# Patient Record
Sex: Female | Born: 1975
Health system: Southern US, Community
[De-identification: ages and names within clinical notes are randomized; demographics above are authoritative.]

## PROBLEM LIST (undated history)

## (undated) DIAGNOSIS — I10 Essential (primary) hypertension: Secondary | ICD-10-CM

## (undated) DIAGNOSIS — G4730 Sleep apnea, unspecified: Secondary | ICD-10-CM

## (undated) DIAGNOSIS — K589 Irritable bowel syndrome without diarrhea: Secondary | ICD-10-CM

## (undated) DIAGNOSIS — E1143 Type 2 diabetes mellitus with diabetic autonomic (poly)neuropathy: Secondary | ICD-10-CM

## (undated) DIAGNOSIS — K219 Gastro-esophageal reflux disease without esophagitis: Secondary | ICD-10-CM

## (undated) DIAGNOSIS — E785 Hyperlipidemia, unspecified: Secondary | ICD-10-CM

## (undated) DIAGNOSIS — J329 Chronic sinusitis, unspecified: Secondary | ICD-10-CM

## (undated) DIAGNOSIS — H47019 Ischemic optic neuropathy, unspecified eye: Secondary | ICD-10-CM

## (undated) DIAGNOSIS — K3184 Gastroparesis: Secondary | ICD-10-CM

## (undated) DIAGNOSIS — J45909 Unspecified asthma, uncomplicated: Secondary | ICD-10-CM

## (undated) DIAGNOSIS — Z8669 Personal history of other diseases of the nervous system and sense organs: Secondary | ICD-10-CM

## (undated) DIAGNOSIS — F419 Anxiety disorder, unspecified: Secondary | ICD-10-CM

## (undated) DIAGNOSIS — G8929 Other chronic pain: Secondary | ICD-10-CM

## (undated) DIAGNOSIS — F32A Depression, unspecified: Secondary | ICD-10-CM

## (undated) DIAGNOSIS — E119 Type 2 diabetes mellitus without complications: Secondary | ICD-10-CM

## (undated) DIAGNOSIS — G629 Polyneuropathy, unspecified: Secondary | ICD-10-CM

## (undated) HISTORY — DX: Hyperlipidemia, unspecified: E78.5

## (undated) HISTORY — DX: Gastro-esophageal reflux disease without esophagitis: K21.9

## (undated) HISTORY — PX: ENDOMETRIAL ABLATION: SHX621

## (undated) HISTORY — PX: CHOLECYSTECTOMY: SHX55

## (undated) HISTORY — DX: Depression, unspecified: F32.A

## (undated) HISTORY — DX: Ischemic optic neuropathy, unspecified eye: H47.019

## (undated) HISTORY — PX: GALLBLADDER SURGERY: SHX652

## (undated) HISTORY — DX: Anxiety disorder, unspecified: F41.9

## (undated) HISTORY — DX: Personal history of other diseases of the nervous system and sense organs: Z86.69

## (undated) HISTORY — PX: TUBAL LIGATION: SHX77

## (undated) HISTORY — DX: Unspecified asthma, uncomplicated: J45.909

## (undated) HISTORY — DX: Polyneuropathy, unspecified: G62.9

## (undated) HISTORY — DX: Sleep apnea, unspecified: G47.30

---
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
## 2013-12-27 DIAGNOSIS — E1142 Type 2 diabetes mellitus with diabetic polyneuropathy: Secondary | ICD-10-CM | POA: Insufficient documentation

## 2013-12-27 DIAGNOSIS — E119 Type 2 diabetes mellitus without complications: Secondary | ICD-10-CM | POA: Insufficient documentation

## 2013-12-27 DIAGNOSIS — Z720 Tobacco use: Secondary | ICD-10-CM | POA: Insufficient documentation

## 2013-12-27 DIAGNOSIS — E782 Mixed hyperlipidemia: Secondary | ICD-10-CM | POA: Insufficient documentation

## 2013-12-27 DIAGNOSIS — E1169 Type 2 diabetes mellitus with other specified complication: Secondary | ICD-10-CM | POA: Insufficient documentation

## 2013-12-27 DIAGNOSIS — K219 Gastro-esophageal reflux disease without esophagitis: Secondary | ICD-10-CM | POA: Insufficient documentation

## 2013-12-27 DIAGNOSIS — E785 Hyperlipidemia, unspecified: Secondary | ICD-10-CM | POA: Insufficient documentation

## 2013-12-27 DIAGNOSIS — Z794 Long term (current) use of insulin: Secondary | ICD-10-CM

## 2015-01-20 LAB — HM COLONOSCOPY

## 2015-04-23 DIAGNOSIS — G4733 Obstructive sleep apnea (adult) (pediatric): Secondary | ICD-10-CM | POA: Insufficient documentation

## 2015-05-20 DIAGNOSIS — G4733 Obstructive sleep apnea (adult) (pediatric): Secondary | ICD-10-CM | POA: Diagnosis not present

## 2015-05-22 ENCOUNTER — Telehealth: Payer: Self-pay

## 2015-05-25 DIAGNOSIS — G4733 Obstructive sleep apnea (adult) (pediatric): Secondary | ICD-10-CM | POA: Diagnosis not present

## 2015-05-28 ENCOUNTER — Encounter (HOSPITAL_COMMUNITY): Payer: Self-pay

## 2015-05-28 ENCOUNTER — Emergency Department (HOSPITAL_COMMUNITY)
Admission: EM | Admit: 2015-05-28 | Discharge: 2015-05-28 | Disposition: A | Discharge status: Home / Self Care | Payer: 59 | Source: Home / Self Care | Attending: Family Medicine | Admitting: Family Medicine

## 2015-05-28 DIAGNOSIS — J209 Acute bronchitis, unspecified: Secondary | ICD-10-CM

## 2015-05-28 HISTORY — DX: Hyperlipidemia, unspecified: E78.5

## 2015-05-28 HISTORY — DX: Type 2 diabetes mellitus without complications: E11.9

## 2015-05-28 MED ORDER — ALBUTEROL SULFATE HFA 108 (90 BASE) MCG/ACT IN AERS: 2.0000 | INHALATION_SPRAY | Freq: Four times a day (QID) | RESPIRATORY_TRACT | Status: DC | PRN

## 2015-05-28 NOTE — ED Provider Notes (Signed)
CSN: [id number]     Arrival date & time 05/28/15  1407 History   First MD Initiated Contact with Patient 05/28/15 1549     Chief Complaint  Patient presents with  . Sinusitis   (Consider location/radiation/quality/duration/timing/severity/associated sxs/prior Treatment) Patient is a 40 y.o. female presenting with sinusitis. The history is provided by the patient. No language interpreter was used.  Sinusitis Associated symptoms: congestion and cough   Associated symptoms: no chest pain, no chills, no ear pain, no sneezing and no wheezing   Patient here at the request of her supervisor, for worsening cough and sputum production over the past several days. She was diagnosed with sinusitis on Dec 30th by her ENT in [place], [place], and started on a 3-week Levaquin regimen.  Completed on Jan 20th. Was doing better regarding the sinus symptoms.  Began with cough and congestion last week, had fever to 103F on Feb 1st.  Has had no recurrent fevers since.  Actually feels like this is clearing up, but comes at request of her supervisor.   Patient is a smoker, trying to quit. Smoking less since onset of illness.   Past Medical History  Diagnosis Date  . Diabetes mellitus without complication (HCC)   . Hyperlipemia    Past Surgical History  Procedure Laterality Date  . Gallbladder surgery    . Tubal ligation    . Endometrial ablation     Family History  Problem Relation Age of Onset  . Cancer Mother   . Diabetes Father    Social History  Substance Use Topics  . Smoking status: Current Every Day Smoker -- 1.00 packs/day    Types: Cigarettes  . Smokeless tobacco: Never Used  . Alcohol Use: No   OB History    No data available     Review of Systems  Constitutional: Positive for appetite change. Negative for chills, activity change and unexpected weight change.  HENT: Positive for congestion, postnasal drip and sinus pressure. Negative for ear pain, facial swelling, mouth sores and  sneezing.   Respiratory: Positive for cough. Negative for chest tightness and wheezing.   Cardiovascular: Negative for chest pain.  All other systems reviewed and are negative.   Allergies  Gabapentin and Keflex  Home Medications   Prior to Admission medications   Medication Sig Start Date End Date Taking? Authorizing Provider  atorvastatin (LIPITOR) 80 MG tablet Take 80 mg by mouth daily.   Yes Historical Provider, MD  canagliflozin (INVOKANA) 100 MG TABS tablet Take by mouth daily before breakfast.   Yes Historical Provider, MD  metFORMIN (GLUCOPHAGE) 500 MG tablet Take by mouth 2 (two) times daily with a meal.   Yes Historical Provider, MD  montelukast (SINGULAIR) 10 MG tablet Take 10 mg by mouth at bedtime.   Yes Historical Provider, MD  pantoprazole (PROTONIX) 40 MG tablet Take 40 mg by mouth daily.   Yes Historical Provider, MD  pregabalin (LYRICA) 50 MG capsule Take 50 mg by mouth 3 (three) times daily.   Yes Historical Provider, MD  albuterol (PROVENTIL HFA;VENTOLIN HFA) 108 (90 Base) MCG/ACT inhaler Inhale 2 puffs into the lungs every 6 (six) hours as needed for wheezing or shortness of breath. 05/28/15   Barbaraann Barthel, MD   Meds Ordered and Administered this Visit  Medications - No data to display  BP 122/77 mmHg  Pulse 77  Temp(Src) 98.1 F (36.7 C) (Oral)  Resp 16  SpO2 97% No data found.   Physical Exam  Constitutional: She  appears well-developed and well-nourished. No distress.  Generally well, speaking in fluid sentences, no distress  HENT:  Head: Normocephalic and atraumatic.  Right Ear: External ear normal.  Left Ear: External ear normal.  Mouth/Throat: Oropharynx is clear and moist. No oropharyngeal exudate.  Boggy nasal turbinates Mildly tender maxillary sinuses with palpation.  Nontender frontal sinuses with palpation.   Eyes: Conjunctivae and EOM are normal. Pupils are equal, round, and reactive to light. Right eye exhibits no discharge. Left eye  exhibits no discharge.  Neck: Normal range of motion. Neck supple. No thyromegaly present.  Cardiovascular: Normal rate and regular rhythm.   Pulmonary/Chest: Effort normal and breath sounds normal. No respiratory distress. She has no wheezes. She has no rales. She exhibits no tenderness.  Lymphadenopathy:    She has no cervical adenopathy.  Skin: She is not diaphoretic.    ED Course  Procedures (including critical care time)  Labs Review Labs Reviewed - No data to display  Imaging Review No results found.   Visual Acuity Review  Right Eye Distance:   Left Eye Distance:   Bilateral Distance:    Right Eye Near:   Left Eye Near:    Bilateral Near:         MDM   1. Acute bronchitis, unspecified organism    Cough, no exam findings to suggest lobar PNA.  Patient actually feels she is improving. Counseled on mucinex  twice daily; increased fluids; continued use of nasal steroids twice daily as she is doing; smoking cessation.  May use albuterol HFA as needed for cough in the short-term. Should continue with ENT for further management of her sinusitis.   Paula Compton, MD     Barbaraann Barthel, MD 05/28/15 [phone]

## 2015-05-28 NOTE — ED Notes (Signed)
Pt stated that she recently had a sinus infection and was given a 21 day dosage of levaquin. Pt stated that her symptoms have returned,productive cough and sinus congestion Pt alert and oriented

## 2015-05-28 NOTE — Discharge Instructions (Signed)
It was a pleasure to see you today.  Your exam today does not look like a pneumonia.   I am giving you a prescription for albuterol inhaler, 2 puffs by mouth every 4 to 6 hours as needed for shortness of breath/cough.   I am glad you are working on quitting smoking!  I recommend following up with your ENT for continued management of your sinusitis.

## 2015-05-29 DIAGNOSIS — G4733 Obstructive sleep apnea (adult) (pediatric): Secondary | ICD-10-CM | POA: Diagnosis not present

## 2015-06-01 MED FILL — PANTOPRAZOLE SOD DR 40 MG T: 40 | 30 days supply | Qty: 60 | Fill #0

## 2015-06-01 MED FILL — LYRICA 50 MG CAPSULE: 50 | 30 days supply | Qty: 30 | Fill #0

## 2015-06-03 MED FILL — INVOKANA 100 MG TABLET: 100 | 30 days supply | Qty: 30 | Fill #0

## 2015-06-16 MED FILL — MONTELUKAST SOD 10 MG TAB: 10 | 30 days supply | Qty: 30 | Fill #0

## 2015-06-20 ENCOUNTER — Encounter: Payer: 59 | Attending: "Endocrinology

## 2015-06-20 VITALS — Ht 62.0 in | Wt 262.6 lb

## 2015-06-20 DIAGNOSIS — Z713 Dietary counseling and surveillance: Secondary | ICD-10-CM | POA: Diagnosis not present

## 2015-06-20 DIAGNOSIS — E119 Type 2 diabetes mellitus without complications: Secondary | ICD-10-CM

## 2015-06-20 NOTE — Progress Notes (Signed)
Patient was seen on 06/20/15 for the complete diabetes self-management series at [facility]. This is a part of the Link to Wellness Program.  Handouts given during class include:  Living Well with Diabetes book  Carb Counting and Meal Planning book  Meal Plan Card  Carbohydrate guide  Meal planning worksheet  Low Sodium Flavoring Tips  The diabetes portion plate  Low Carbohydrate Snack Suggestions  A1c to eAG Conversion Chart  Diabetes Medications  Stress Management  Diabetes Recommended Care Schedule  Diabetes Success Plan  Core Class Satisfaction Survey  The following learning objectives were met by the patient during this course:  Describe diabetes  State some common risk factors for diabetes  Defines the role of glucose and insulin  Identifies type of diabetes and pathophysiology  Describe the relationship between diabetes and cardiovascular risk  State the members of the Healthcare Team  States the rationale for glucose monitoring  State when to test glucose  State their individual Target Range  State the importance of logging glucose readings  Describe how to interpret glucose readings  Identifies A1C target  Explain the correlation between A1c and eAG values  State symptoms and treatment of high blood glucose  State symptoms and treatment of low blood glucose  Explain proper technique for glucose testing  Identifies proper sharps disposal  Describe the role of different macronutrients on glucose  Explain how carbohydrates affect blood glucose  State what foods contain the most carbohydrates  Demonstrate carbohydrate counting  Demonstrate how to read Nutrition Facts food label  Describe effects of various fats on heart health  Describe the importance of good nutrition for health and healthy eating strategies  Describe techniques for managing your shopping, cooking and meal planning  List  strategies to follow meal plan when dining out  Describe the effects of alcohol on glucose and how to use it safely . State the amount of activity recommended for healthy living . Describe activities suitable for individual needs . Identify ways to regularly incorporate activity into daily life . Identify barriers to activity and ways to over come these barriers  Identify diabetes medications being personally used and their primary action for lowering glucose and possible side effects . Describe role of stress on blood glucose and develop strategies to address psychosocial issues . Identify diabetes complications and ways to prevent them  Explain how to manage diabetes during illness . Evaluate success in meeting personal goal . Establish 2-3 goals that they will plan to diligently work on until they return for the  4-month follow-up visit  Goals:   I will count my carb choices at most meals and snacks  Try to reduce/cut out soda  I will be active 30 minutes or more 7 times a week  I will take my diabetes medications as scheduled  I will eat less unhealthy fats by eating less junk food  I will test my glucose at least 2 times a day, 7 days a week  I will look at patterns in my record book at least 10 days a month  To help manage stress I will  Take a day trip with my family to reboot  Your patient has identified these potential barriers to change:  Motivation  Your patient has identified their diabetes self-care support plan as  [facility] Support Group Family Support Link to Wellness  Plan: Follow up with Link to Wellness Care Coordinator  

## 2015-06-22 DIAGNOSIS — G4733 Obstructive sleep apnea (adult) (pediatric): Secondary | ICD-10-CM | POA: Diagnosis not present

## 2015-06-30 MED FILL — LYRICA 50 MG CAPSULE: 50 | 30 days supply | Qty: 30 | Fill #1

## 2015-06-30 MED FILL — ATORVASTATIN 80 MG TABLET: 80 | 90 days supply | Qty: 90 | Fill #0

## 2015-06-30 MED FILL — PANTOPRAZOLE SOD DR 40 MG T: 40 | 30 days supply | Qty: 60 | Fill #1

## 2015-07-03 DIAGNOSIS — G4733 Obstructive sleep apnea (adult) (pediatric): Secondary | ICD-10-CM | POA: Diagnosis not present

## 2015-07-08 MED FILL — INVOKANA 100 MG TABLET: 100 | 30 days supply | Qty: 30 | Fill #1

## 2015-07-13 ENCOUNTER — Other Ambulatory Visit: Payer: Self-pay

## 2015-07-13 VITALS — BP 112/66 | HR 84 | Resp 16 | Ht 62.0 in | Wt 269.8 lb

## 2015-07-13 DIAGNOSIS — E119 Type 2 diabetes mellitus without complications: Secondary | ICD-10-CM

## 2015-07-13 NOTE — Patient Instructions (Addendum)
1. Plan to eat 30-45 GM (2-3) servings of carbohydrate a meal and 15 GM for snacks.  Plan to eat protein with your snacks 2. Plan to check blood sugar twice a day fasting and 1 -2hrs after a meal.  Goals of 80-130 fasting and 180 or less after eating. 3. Plan to cut out canned Mountain Dew at dinner 3 times a week 4. Plan to walk 4-5 times a week for 30 minutes.  Goal of 150 minutes a week 5. Plan to see provider on April 7 6. Plan to complete EMMI by Aug 31, 2015 7. Plan to return to Link to Wellness on 09/01/15 12:15PM

## 2015-07-13 NOTE — Patient Outreach (Signed)
[facility]) Care Management   07/13/2015  Kelsey Vang 1975/07/28 [id number]  Kelsey Vang is an 40 y.o. female.  Member seen for initial office visit for Link to Wellness program for self management of Type 2 diabetes  Subjective: Member states that she wants to get her diabetes under better control and to lose weight.  States that she went to the diabetes class and learned a lot.  States she is trying to watch her portion sizes but she is still drinking Mountain dew daily.  States she can not drink diet drinks as they irritate her bladder.  States she has signed up to start the [employer] classes next month to stop smoking.  States she walks her dogs daily which is her only exercise.  States her glucometer is broken and she has not checked her blood sugars for about a week.    Objective:   Review of Systems  Neurological: Positive for headaches.  All other systems reviewed and are negative.   Physical Exam Today's Vitals   07/13/15 1506 07/13/15 1509  BP: 112/66   Pulse: 84   Resp: 16   Height: 1.575 m ( )   Weight: 269 lb 12.8 oz (122.38 kg)   SpO2: 98%   PainSc: 6  6    Current Medications:   Current Outpatient Prescriptions  Medication Sig Dispense Refill  . atorvastatin (LIPITOR) 80 MG tablet Take 80 mg by mouth daily.    . canagliflozin (INVOKANA) 100 MG TABS tablet Take by mouth daily before breakfast.    . cetirizine (ZYRTEC) 10 MG tablet Take 10 mg by mouth daily.    . Cholecalciferol (VITAMIN D) 2000 units CAPS Take 1 capsule by mouth daily.    . cyclobenzaprine (FLEXERIL) 10 MG tablet Take 10 mg by mouth 3 (three) times daily as needed for muscle spasms.    . fluticasone (FLONASE) 50 MCG/ACT nasal spray Place 1 spray into both nostrils 2 (two) times daily.    . metFORMIN (GLUCOPHAGE) 500 MG tablet Take by mouth 2 (two) times daily with a meal.    . Misc Natural Products (CRANBERRY/PROBIOTIC PO) Take 1 capsule by mouth 2 (two) times daily.     . montelukast (SINGULAIR) 10 MG tablet Take 10 mg by mouth at bedtime.    . pantoprazole (PROTONIX) 40 MG tablet Take 40 mg by mouth 2 (two) times daily.     . pentosan polysulfate (ELMIRON) 100 MG capsule Take 100 mg by mouth 3 (three) times daily.    Marland Kitchen albuterol (PROVENTIL HFA;VENTOLIN HFA) 108 (90 Base) MCG/ACT inhaler Inhale 2 puffs into the lungs every 6 (six) hours as needed for wheezing or shortness of breath. (Patient not taking: Reported on 07/13/2015) 1 Inhaler 0  . pregabalin (LYRICA) 50 MG capsule Take 50 mg by mouth daily.      No current facility-administered medications for this visit.    Functional Status:   In your present state of health, do you have any difficulty performing the following activities: 07/13/2015  Hearing? N  Vision? N  Difficulty concentrating or making decisions? N  Walking or climbing stairs? N  Dressing or bathing? N  Doing errands, shopping? N    Fall/Depression Screening:    PHQ 2/9 Scores 07/13/2015 06/20/2015  PHQ - 2 Score 0 0    Assessment: Member seen for initial office visit for Link to Wellness program for self management of Type 2 diabetes.  Member has completed required education classes and  her pharmacy benefits have been activated.  Member is not at diabetes self management goal of hemoglobin A1C of 7% or below with last reading of 7.1%.  Member issued new True Metrix glucometer and she is to check twice a day.  She reports trying to watch her portion sizes better but continues to drink regular soda daily.  Members is walking her dogs daily for 15-20 minutes.  Member is currently smoking but plans to quit using the [employer] classes next month.  Member is up to date with annual eye exams and regular dental check ups. Member is considering joining [employer] to assist with weight loss.  Plan:   1. Plan to eat 30-45 GM (2-3) servings of carbohydrate a meal and 15 GM for snacks.  Plan to eat protein with your snacks 2. Plan to check  blood sugar twice a day fasting and 1 -2hrs after a meal.  Goals of 80-130 fasting and 180 or less after eating. 3. Plan to cut out canned Mountain Dew at dinner 3 times a week 4. Plan to walk 4-5 times a week for 30 minutes.  Goal of 150 minutes a week 5. Plan to see provider on April 7 6. Plan to complete EMMI by Aug 31, 2015 7. Plan to return to Link to Wellness on 09/01/15 12:15PM  [facility] CM Care Plan Problem One        Most Recent Value   Care Plan Problem One  Potential for elevated blood sugars related to dx of Type 2 DM   Role Documenting the Problem One  Care Management Coordinator   Care Plan for Problem One  Active   [facility] Long Term Goal (31-90 days)  Member will decrease hemoglobin A1C to 7 or below in the next 90 days   [facility] Long Term Goal Start Date  07/13/15   Interventions for Problem One Long Term Goal  Given Link to Wellness diabetes education packet and reviewed program requirements, Instructed on CHO counting and portion  control, Discussed cutting back and stopping drinking [address], Instructed on importance of losing weight and given written instructions on how to join [employer] with the [facility] discount, Encouraged to stop smoking and to attend the Quit smart classes she has signed up to attend, Issued True Metrix glucometer and demostrated on how to use, Instructed to contact her provider to call in RX for strips and testing supplies, Instructed on blood sugar goals before and after eating    Dudley Major RN, [facility] Care Management Coordinator-Link to Wellness [facility] Care Management [phone]

## 2015-07-23 DIAGNOSIS — G4733 Obstructive sleep apnea (adult) (pediatric): Secondary | ICD-10-CM | POA: Diagnosis not present

## 2015-07-24 DIAGNOSIS — K219 Gastro-esophageal reflux disease without esophagitis: Secondary | ICD-10-CM | POA: Diagnosis not present

## 2015-07-24 DIAGNOSIS — E7800 Pure hypercholesterolemia, unspecified: Secondary | ICD-10-CM | POA: Diagnosis not present

## 2015-07-24 DIAGNOSIS — J329 Chronic sinusitis, unspecified: Secondary | ICD-10-CM | POA: Diagnosis not present

## 2015-07-24 DIAGNOSIS — R300 Dysuria: Secondary | ICD-10-CM | POA: Diagnosis not present

## 2015-07-24 DIAGNOSIS — E119 Type 2 diabetes mellitus without complications: Secondary | ICD-10-CM | POA: Diagnosis not present

## 2015-07-24 DIAGNOSIS — Z7984 Long term (current) use of oral hypoglycemic drugs: Secondary | ICD-10-CM | POA: Diagnosis not present

## 2015-07-24 MED FILL — LISINOPRIL 5 MG TABLET: 5 | 90 days supply | Qty: 90 | Fill #0

## 2015-07-24 MED FILL — METFORMIN HCL ER 500 MG TAB: 500 | 90 days supply | Qty: 90 | Fill #0

## 2015-07-24 MED FILL — TRUE METRIX GLUCOSE TEST ST: 90 days supply | Qty: 200 | Fill #0

## 2015-07-24 MED FILL — CYCLOBENZAPRINE 10 MG TAB: 10 | 15 days supply | Qty: 45 | Fill #0

## 2015-07-24 MED FILL — MONTELUKAST SOD 10 MG TAB: 10 | 90 days supply | Qty: 90 | Fill #0

## 2015-07-24 MED FILL — FLUCONAZOLE 150 MG TABLET: 150 | 1 days supply | Qty: 1 | Fill #0

## 2015-08-07 MED FILL — PANTOPRAZOLE SOD DR 40 MG T: 40 | 30 days supply | Qty: 60 | Fill #2

## 2015-08-07 MED FILL — LYRICA 50 MG CAPSULE: 50 | 30 days supply | Qty: 30 | Fill #2

## 2015-08-10 MED FILL — INVOKANA 100 MG TABLET: 100 | 30 days supply | Qty: 30 | Fill #2

## 2015-08-13 DIAGNOSIS — G43909 Migraine, unspecified, not intractable, without status migrainosus: Secondary | ICD-10-CM | POA: Diagnosis not present

## 2015-08-13 DIAGNOSIS — H6690 Otitis media, unspecified, unspecified ear: Secondary | ICD-10-CM | POA: Diagnosis not present

## 2015-08-13 MED FILL — FLUCONAZOLE 150 MG TABLET: 150 | 1 days supply | Qty: 1 | Fill #0

## 2015-08-13 MED FILL — ONDANSETRON HCL 4 MG TABLET: 4 | 20 days supply | Qty: 20 | Fill #0

## 2015-08-14 MED FILL — FLUCONAZOLE 150 MG TABLET: 150 | 1 days supply | Qty: 1 | Fill #0

## 2015-08-14 MED FILL — AZITHROMYCIN 250 MG TABLET: 250 | 5 days supply | Qty: 6 | Fill #0

## 2015-08-22 DIAGNOSIS — G4733 Obstructive sleep apnea (adult) (pediatric): Secondary | ICD-10-CM | POA: Diagnosis not present

## 2015-08-28 DIAGNOSIS — J343 Hypertrophy of nasal turbinates: Secondary | ICD-10-CM | POA: Diagnosis not present

## 2015-08-28 DIAGNOSIS — J342 Deviated nasal septum: Secondary | ICD-10-CM | POA: Diagnosis not present

## 2015-08-28 DIAGNOSIS — J324 Chronic pansinusitis: Secondary | ICD-10-CM | POA: Diagnosis not present

## 2015-08-28 MED FILL — NITROFURANTOIN MONO-MCR 100: 100 | 5 days supply | Qty: 10 | Fill #0

## 2015-09-01 ENCOUNTER — Ambulatory Visit: Payer: Self-pay

## 2015-09-04 MED FILL — PANTOPRAZOLE SOD DR 40 MG T: 40 | 90 days supply | Qty: 180 | Fill #3

## 2015-09-04 MED FILL — INVOKANA 100 MG TABLET: 100 | 90 days supply | Qty: 90 | Fill #0

## 2015-09-04 MED FILL — LYRICA 50 MG CAPSULE: 50 | 30 days supply | Qty: 30 | Fill #3

## 2015-09-07 MED FILL — METFORMIN HCL ER 500 MG TAB: 500 | 90 days supply | Qty: 180 | Fill #0

## 2015-09-11 DIAGNOSIS — G4733 Obstructive sleep apnea (adult) (pediatric): Secondary | ICD-10-CM | POA: Diagnosis not present

## 2015-09-22 DIAGNOSIS — G4733 Obstructive sleep apnea (adult) (pediatric): Secondary | ICD-10-CM | POA: Diagnosis not present

## 2015-09-23 DIAGNOSIS — N898 Other specified noninflammatory disorders of vagina: Secondary | ICD-10-CM | POA: Diagnosis not present

## 2015-09-23 DIAGNOSIS — Z720 Tobacco use: Secondary | ICD-10-CM | POA: Diagnosis not present

## 2015-09-23 DIAGNOSIS — R102 Pelvic and perineal pain: Secondary | ICD-10-CM | POA: Diagnosis not present

## 2015-09-23 MED FILL — metroNIDAZOLE 500 MG TABS: 500 | 7 days supply | Qty: 14 | Fill #0

## 2015-09-24 MED FILL — ELMIRON 100 MG CAPSULE: 100 | 30 days supply | Qty: 90 | Fill #0

## 2015-10-07 MED FILL — LYRICA 50 MG CAPSULE: 50 | 30 days supply | Qty: 30 | Fill #4

## 2015-10-07 MED FILL — ATORVASTATIN 80 MG TABLET: 80 | 30 days supply | Qty: 30 | Fill #1

## 2015-10-12 DIAGNOSIS — G4733 Obstructive sleep apnea (adult) (pediatric): Secondary | ICD-10-CM | POA: Diagnosis not present

## 2015-10-28 MED FILL — FLUTICASONE PROP 50 MCG SPR: 50 | 30 days supply | Qty: 16 | Fill #0

## 2015-10-29 MED FILL — MONTELUKAST SOD 10 MG TAB: 10 | 90 days supply | Qty: 90 | Fill #1

## 2015-10-29 MED FILL — LISINOPRIL 5 MG TABLET: 5 | 90 days supply | Qty: 90 | Fill #1

## 2015-10-29 MED FILL — ELMIRON 100 MG CAPSULE: 100 | 30 days supply | Qty: 90 | Fill #1

## 2015-10-29 MED FILL — TRUE METRIX GLUCOSE TEST ST: 90 days supply | Qty: 200 | Fill #1

## 2015-10-30 ENCOUNTER — Other Ambulatory Visit: Payer: Self-pay

## 2015-10-30 VITALS — BP 104/62 | HR 89 | Resp 16 | Ht 62.0 in | Wt 272.2 lb

## 2015-10-30 DIAGNOSIS — E119 Type 2 diabetes mellitus without complications: Secondary | ICD-10-CM

## 2015-10-30 NOTE — Patient Instructions (Signed)
1. Plan to eat 30-45 GM (2-3) servings of carbohydrate a meal and 15 GM for snacks.  Plan to eat protein with your snacks and eat a bedtime snack with protein 2. Plan to check blood sugar twice a day fasting and 1 -2hrs after a meal.  Goals of 80-120 fasting and 180 or less after eating. 3. Plan to cut out canned Mountain Dew at dinner 3 times a week 4. Plan to walk 4-5 times a week for 30 minutes.  Goal of 150 minutes a week 5. Plan to see provider on 11/20/15 6. Plan to complete EMMI by 01/17/16 7. Plan to return to Link to Wellness on 02/05/16 at [facility]

## 2015-10-30 NOTE — Patient Outreach (Signed)
[facility] Care Management   10/30/2015  Kelsey Vang Jun 04, 1975 [id number]  Kelsey Vang is an 40 y.o. female.. Member seen for follow up office visit for Link to Wellness program for self management of Type 2 diabetes  Subjective: Member states that when she saw her new provider in April her hemoglobin A1C was 7.5%.  States that she changed her Metformin to the extended release.  States that she was having urinary problems and she changed her Invokana to [place].  States that her blood sugars have been running higher but are starting to go down some.  States that her morning readings are usually high.  States that they range from the high 100's to 300  and her after meals 100's to 200's.  States she is to go back on 11/20/15 for follow up.  States she started using a nicotine patch today.  States she is still drinking [employer] at work and with her dinner.    Objective:   Review of Systems  Neurological: Positive for headaches.  All other systems reviewed and are negative. Reviewed glucometer 7 day average-205 14 day average-209  30 day average-218  Physical Exam Today's Vitals   10/30/15 1505  BP: 104/62  Pulse: 89  Resp: 16  Height: 1.575 m ( )  Weight: 272 lb 3.2 oz (123.469 kg)  SpO2: 96%  PainSc: 0-No pain   Encounter Medications:   Outpatient Encounter Prescriptions as of 10/30/2015  Medication Sig  . atorvastatin (LIPITOR) 80 MG tablet Take 80 mg by mouth daily.  . cetirizine (ZYRTEC) 10 MG tablet Take 10 mg by mouth daily.  . Cholecalciferol (VITAMIN D) 2000 units CAPS Take 1 capsule by mouth daily.  . cyclobenzaprine (FLEXERIL) 10 MG tablet Take 10 mg by mouth 3 (three) times daily as needed for muscle spasms.  . fluticasone (FLONASE) 50 MCG/ACT nasal spray Place 1 spray into both nostrils 2 (two) times daily.  . metFORMIN (GLUCOPHAGE-XR) 500 MG 24 hr tablet Take 1,000 mg by mouth daily with breakfast.  . montelukast (SINGULAIR) 10 MG  tablet Take 10 mg by mouth at bedtime.  . pantoprazole (PROTONIX) 40 MG tablet Take 40 mg by mouth 2 (two) times daily.   . pentosan polysulfate (ELMIRON) 100 MG capsule Take 100 mg by mouth 3 (three) times daily.  . pregabalin (LYRICA) 50 MG capsule Take 50 mg by mouth daily.   . sitaGLIPtin (JANUVIA) 100 MG tablet Take 100 mg by mouth daily.  Marland Kitchen albuterol (PROVENTIL HFA;VENTOLIN HFA) 108 (90 Base) MCG/ACT inhaler Inhale 2 puffs into the lungs every 6 (six) hours as needed for wheezing or shortness of breath. (Patient not taking: Reported on 07/13/2015)  . canagliflozin (INVOKANA) 100 MG TABS tablet Take by mouth daily before breakfast. Reported on 10/30/2015  . metFORMIN (GLUCOPHAGE) 500 MG tablet Take by mouth 2 (two) times daily with a meal. Reported on 10/30/2015  . Misc Natural Products (CRANBERRY/PROBIOTIC PO) Take 1 capsule by mouth 2 (two) times daily. Reported on 10/30/2015   No facility-administered encounter medications on file as of 10/30/2015.    Functional Status:   In your present state of health, do you have any difficulty performing the following activities: 10/30/2015 07/13/2015  Hearing? N N  Vision? N N  Difficulty concentrating or making decisions? N N  Walking or climbing stairs? N N  Dressing or bathing? N N  Doing errands, shopping? N N    Fall/Depression Screening:    PHQ 2/9 Scores 10/30/2015  07/13/2015 06/20/2015  PHQ - 2 Score 0 0 0    Assessment:  Member seen for follow up office visit for Link to Wellness program for self management of Type 2 diabetes. Member is not at diabetes self management goal of hemoglobin A1C of 7% or below with last reading increased to  7.5%.Member saw her new provider and her Metformin was changed to extended release and she is now on Januvia instead of Invokana due to urinary problems.  Review of glucometer showed fasting CBGs range from 185-310 and post prandial 168-310. She reports  continues to drink regular soda daily. Members is  walking at work but has not been exercising due to hot weather. Member is currently smoking but plans to quit using the [employer] classes and started patch today. Member is up to date with annual eye exams and regular dental check ups.   Plan:  Plan to eat 30-45 GM (2-3) servings of carbohydrate a meal and 15 GM for snacks.  Plan to eat protein with your snacks and eat a bedtime snack with protein Plan to check blood sugar twice a day fasting and 1 -2hrs after a meal.  Goals of 80-120 fasting and 180 or less after eating. Plan to cut out canned Mountain Dew at dinner 3 times a week Plan to walk 4-5 times a week for 30 minutes.  Goal of 150 minutes a week Plan to see provider on 11/20/15 Plan to complete EMMI by 01/17/16 Plan to return to Link to Wellness on 02/05/16 at [facility] CM Care Plan Problem One        Most Recent Value   Care Plan Problem One  Elevated blood sugars as evidenced by hemoglobin A1C 7.5% related to dx of Type 2 DM   Role Documenting the Problem One  Care Management Coordinator   Care Plan for Problem One  Active   [facility] Long Term Goal (31-90 days)  Member will decrease hemoglobin A1C to 7 or below in the next 90 days   [facility] Long Term Goal Start Date  10/30/15   Interventions for Problem One Long Term Goal  Reviewed CHO counting and portion  control, Reinforced to try cutting back and stopping drinking [employer], Reinforced importance of losing weight and encouraged to discuss with provider weight loss medications, Given handout on the healthy plate for diabetes, Encouraged to stop smoking and to attend the Quit smart classes, Reviewed blood sugar goals before and after eating    Dudley Major RN, [facility] Care Management Coordinator-Link to Wellness [facility] Care Management [phone]

## 2015-11-11 DIAGNOSIS — G4733 Obstructive sleep apnea (adult) (pediatric): Secondary | ICD-10-CM | POA: Diagnosis not present

## 2015-11-11 MED FILL — ATORVASTATIN 80 MG TABLET: 80 | 90 days supply | Qty: 90 | Fill #0

## 2015-11-11 MED FILL — JANUVIA 100 MG TABLET: 100 | 90 days supply | Qty: 90 | Fill #0

## 2015-11-13 MED FILL — LYRICA 50 MG CAPSULE: 50 | 90 days supply | Qty: 90 | Fill #0

## 2015-11-20 ENCOUNTER — Other Ambulatory Visit: Payer: Self-pay | Admitting: Physician Assistant

## 2015-11-20 ENCOUNTER — Other Ambulatory Visit (HOSPITAL_COMMUNITY)
Admission: RE | Admit: 2015-11-20 | Discharge: 2015-11-20 | Disposition: A | Discharge status: Home / Self Care | Payer: 59 | Source: Ambulatory Visit | Attending: Family Medicine | Admitting: Family Medicine

## 2015-11-20 DIAGNOSIS — Z124 Encounter for screening for malignant neoplasm of cervix: Secondary | ICD-10-CM | POA: Insufficient documentation

## 2015-11-20 DIAGNOSIS — E7800 Pure hypercholesterolemia, unspecified: Secondary | ICD-10-CM | POA: Diagnosis not present

## 2015-11-20 DIAGNOSIS — Z0000 Encounter for general adult medical examination without abnormal findings: Secondary | ICD-10-CM | POA: Diagnosis not present

## 2015-11-20 DIAGNOSIS — E119 Type 2 diabetes mellitus without complications: Secondary | ICD-10-CM | POA: Diagnosis not present

## 2015-11-20 DIAGNOSIS — Z720 Tobacco use: Secondary | ICD-10-CM | POA: Diagnosis not present

## 2015-11-20 DIAGNOSIS — K219 Gastro-esophageal reflux disease without esophagitis: Secondary | ICD-10-CM | POA: Diagnosis not present

## 2015-11-20 DIAGNOSIS — G479 Sleep disorder, unspecified: Secondary | ICD-10-CM | POA: Diagnosis not present

## 2015-11-20 DIAGNOSIS — G43909 Migraine, unspecified, not intractable, without status migrainosus: Secondary | ICD-10-CM | POA: Diagnosis not present

## 2015-11-20 MED FILL — AMITRIPTYLINE HCL 25 MG TAB: 25 | 30 days supply | Qty: 30 | Fill #0

## 2015-11-23 MED FILL — UNIFINE PENTIPS 8MM 31G: 31G X 8 MM | 30 days supply | Qty: 100 | Fill #0

## 2015-11-23 MED FILL — LEVEMIR FLEXTOUCH 100 UNITS: 100 | 30 days supply | Qty: 15 | Fill #0

## 2015-11-24 LAB — CYTOLOGY - PAP

## 2015-11-24 MED FILL — EZETIMIBE 10 MG TABLET: 10 | 30 days supply | Qty: 30 | Fill #0

## 2015-12-04 MED FILL — AMITRIPTYLINE HCL 50 MG TAB: 50 | 30 days supply | Qty: 30 | Fill #0

## 2015-12-11 DIAGNOSIS — G4733 Obstructive sleep apnea (adult) (pediatric): Secondary | ICD-10-CM | POA: Diagnosis not present

## 2015-12-11 MED FILL — METFORMIN HCL ER 500 MG TAB: 500 | 90 days supply | Qty: 180 | Fill #0

## 2015-12-11 MED FILL — PANTOPRAZOLE SOD DR 40 MG T: 40 | 90 days supply | Qty: 180 | Fill #4

## 2015-12-11 MED FILL — FLUTICASONE PROP 50 MCG SPR: 50 | 30 days supply | Qty: 16 | Fill #0

## 2015-12-23 DIAGNOSIS — G4733 Obstructive sleep apnea (adult) (pediatric): Secondary | ICD-10-CM | POA: Diagnosis not present

## 2015-12-25 MED FILL — ELMIRON 100 MG CAPSULE: 100 | 30 days supply | Qty: 90 | Fill #2

## 2015-12-25 MED FILL — EZETIMIBE 10 MG TABLET: 10 | 30 days supply | Qty: 30 | Fill #1

## 2016-01-01 MED FILL — AMITRIPTYLINE HCL 50 MG TAB: 50 | 30 days supply | Qty: 30 | Fill #0

## 2016-01-11 DIAGNOSIS — G4733 Obstructive sleep apnea (adult) (pediatric): Secondary | ICD-10-CM | POA: Diagnosis not present

## 2016-01-12 ENCOUNTER — Emergency Department (HOSPITAL_COMMUNITY)
Admission: EM | Admit: 2016-01-12 | Discharge: 2016-01-13 | Disposition: A | Discharge status: Home / Self Care | Payer: 59 | Attending: Emergency Medicine | Admitting: Emergency Medicine

## 2016-01-12 ENCOUNTER — Encounter (HOSPITAL_COMMUNITY): Payer: Self-pay

## 2016-01-12 DIAGNOSIS — F17210 Nicotine dependence, cigarettes, uncomplicated: Secondary | ICD-10-CM | POA: Insufficient documentation

## 2016-01-12 DIAGNOSIS — R1013 Epigastric pain: Secondary | ICD-10-CM | POA: Diagnosis not present

## 2016-01-12 DIAGNOSIS — E119 Type 2 diabetes mellitus without complications: Secondary | ICD-10-CM | POA: Insufficient documentation

## 2016-01-12 DIAGNOSIS — Z7984 Long term (current) use of oral hypoglycemic drugs: Secondary | ICD-10-CM | POA: Insufficient documentation

## 2016-01-12 DIAGNOSIS — K209 Esophagitis, unspecified without bleeding: Secondary | ICD-10-CM

## 2016-01-12 LAB — CBC
HEMATOCRIT: 42.8 % (ref 36.0–46.0)
Hemoglobin: 14.1 g/dL (ref 12.0–15.0)
MCH: 30.1 pg (ref 26.0–34.0)
MCHC: 32.9 g/dL (ref 30.0–36.0)
MCV: 91.3 fL (ref 78.0–100.0)
Platelets: 318 10*3/uL (ref 150–400)
RBC: 4.69 MIL/uL (ref 3.87–5.11)
RDW: 14.5 % (ref 11.5–15.5)
WBC: 11.5 10*3/uL — AB (ref 4.0–10.5)

## 2016-01-12 LAB — COMPREHENSIVE METABOLIC PANEL
ALBUMIN: 3.7 g/dL (ref 3.5–5.0)
ALT: 52 U/L (ref 14–54)
AST: 46 U/L — AB (ref 15–41)
Alkaline Phosphatase: 95 U/L (ref 38–126)
Anion gap: 10 (ref 5–15)
BUN: 12 mg/dL (ref 6–20)
CHLORIDE: 101 mmol/L (ref 101–111)
CO2: 26 mmol/L (ref 22–32)
Calcium: 9.9 mg/dL (ref 8.9–10.3)
Creatinine, Ser: 0.63 mg/dL (ref 0.44–1.00)
GFR calc Af Amer: >60 mL/min (ref >60)
Glucose, Bld: 225 mg/dL — ABNORMAL HIGH (ref 65–99)
POTASSIUM: 4.2 mmol/L (ref 3.5–5.1)
SODIUM: 137 mmol/L (ref 135–145)
Total Bilirubin: 0.6 mg/dL (ref 0.3–1.2)
Total Protein: 7.1 g/dL (ref 6.5–8.1)

## 2016-01-12 LAB — URINALYSIS, ROUTINE W REFLEX MICROSCOPIC
Bilirubin Urine: NEGATIVE
Glucose, UA: 250 mg/dL — AB
HGB URINE DIPSTICK: NEGATIVE
Ketones, ur: NEGATIVE mg/dL
Leukocytes, UA: NEGATIVE
Nitrite: NEGATIVE
PH: 6 (ref 5.0–8.0)
Protein, ur: NEGATIVE mg/dL
SPECIFIC GRAVITY, URINE: 1.007 (ref 1.005–1.030)

## 2016-01-12 LAB — PREGNANCY, URINE: PREG TEST UR: NEGATIVE

## 2016-01-12 LAB — LIPASE, BLOOD: LIPASE: 42 U/L (ref 11–51)

## 2016-01-12 MED ORDER — GI COCKTAIL ~~LOC~~
30.0000 mL | Freq: Once | ORAL | Status: AC
  Administered 2016-01-12: 30 mL via ORAL
  Filled 2016-01-12: qty 30

## 2016-01-12 NOTE — ED Triage Notes (Signed)
Pt reports abd pain, primarily in the epigastric and LUQ region. Pt reports burning when she drinks water. Sent here by PCP to rule out pancreatitis.

## 2016-01-12 NOTE — ED Provider Notes (Signed)
[facility]-EMERGENCY DEPT Provider Note   CSN: [id number] Arrival date & time: 01/12/16  1626     History   Chief Complaint Chief Complaint  Patient presents with  . 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  Patient presents after earlier evaluation [facility] for epigastric abdominal pain and vomiting with history of pancreatitis. She was sent here for further evaluation after finding of leukocytosis. Pain has been on and off for 2 weeks, constant for 2 days. Described as sharp pain at epigastrium and under left greater than right breasts. Made worse by eating or drinking anything. No worse when reclining, but is some worse with bending over. No alleviating factors including Protonix twice daily and use Prilosec today. No fever, chills, SOB, change in bowel habits. No alcohol use in 9 years. History of cholecystectomy. Pancreatitis history was one episode following gall stones, none since. She reports she has a hiatal hernia with EGD in October of last year. She has been taking Protonix since that time.    The history is provided by the patient. No language interpreter was used.  Abdominal Pain   Associated symptoms include nausea and vomiting. Pertinent negatives include fever.    Past Medical History:  Diagnosis Date  . Diabetes mellitus without complication (HCC)   . Hyperlipemia     Patient Active Problem List   Diagnosis Date Noted  . Obstructive apnea 04/23/2015  . Type 2 diabetes mellitus (HCC) 12/27/2013  . Current tobacco use 12/27/2013  . Morbid (severe) obesity due to excess calories (HCC) 12/27/2013  . HLD (hyperlipidemia) 12/27/2013    Past Surgical History:  Procedure Laterality Date  . CHOLECYSTECTOMY    . ENDOMETRIAL ABLATION    . GALLBLADDER SURGERY    . TUBAL LIGATION      OB History    No data available       Home Medications    Prior to Admission medications   Medication Sig Start Date End Date Taking? Authorizing  Provider  albuterol (PROVENTIL HFA;VENTOLIN HFA) 108 (90 Base) MCG/ACT inhaler Inhale 2 puffs into the lungs every 6 (six) hours as needed for wheezing or shortness of breath. Patient not taking: Reported on 07/13/2015 05/28/15   Barbaraann BarthelJames O Breen, MD  atorvastatin (LIPITOR) 80 MG tablet Take 80 mg by mouth daily.    Historical Provider, MD  canagliflozin (INVOKANA) 100 MG TABS tablet Take by mouth daily before breakfast. Reported on 10/30/2015    Historical Provider, MD  cetirizine (ZYRTEC) 10 MG tablet Take 10 mg by mouth daily.    Historical Provider, MD  Cholecalciferol (VITAMIN D) 2000 units CAPS Take 1 capsule by mouth daily.    Historical Provider, MD  cyclobenzaprine (FLEXERIL) 10 MG tablet Take 10 mg by mouth 3 (three) times daily as needed for muscle spasms.    Historical Provider, MD  fluticasone (FLONASE) 50 MCG/ACT nasal spray Place 1 spray into both nostrils 2 (two) times daily.    Historical Provider, MD  metFORMIN (GLUCOPHAGE) 500 MG tablet Take by mouth 2 (two) times daily with a meal. Reported on 10/30/2015    Historical Provider, MD  metFORMIN (GLUCOPHAGE-XR) 500 MG 24 hr tablet Take 1,000 mg by mouth daily with breakfast.    Historical Provider, MD  Misc Natural Products (CRANBERRY/PROBIOTIC PO) Take 1 capsule by mouth 2 (two) times daily. Reported on 10/30/2015    Historical Provider, MD  montelukast (SINGULAIR) 10 MG tablet Take 10 mg by mouth at  bedtime.    Historical Provider, MD  pantoprazole (PROTONIX) 40 MG tablet Take 40 mg by mouth 2 (two) times daily.     Historical Provider, MD  pentosan polysulfate (ELMIRON) 100 MG capsule Take 100 mg by mouth 3 (three) times daily.    Historical Provider, MD  pregabalin (LYRICA) 50 MG capsule Take 50 mg by mouth daily.     Historical Provider, MD  sitaGLIPtin (JANUVIA) 100 MG tablet Take 100 mg by mouth daily.    Historical Provider, MD    Family History Family History  Problem Relation Age of Onset  . Cancer Mother   . Diabetes Father       Social History Social History  Substance Use Topics  . Smoking status: Current Every Day Smoker    Packs/day: 1.00    Types: Cigarettes  . Smokeless tobacco: Never Used  . Alcohol use No     Allergies   Gabapentin and Keflex [cephalexin]   Review of Systems Review of Systems  Constitutional: Negative for chills and fever.  HENT: Negative.   Respiratory: Negative.  Negative for shortness of breath.   Cardiovascular: Negative.  Negative for chest pain.  Gastrointestinal: Positive for abdominal pain, nausea and vomiting.  Genitourinary: Negative.   Musculoskeletal: Negative.  Negative for back pain.  Neurological: Negative.      Physical Exam Updated Vital Signs BP 118/73   Pulse 94   Temp 98.3 F (36.8 C)   Resp 18   Ht 5\' 2"  (1.575 m)   Wt 123.8 kg   SpO2 99%   BMI 49.93 kg/m   Physical Exam  Constitutional: She appears well-developed and well-nourished.  HENT:  Head: Normocephalic.  Neck: Normal range of motion. Neck supple.  Cardiovascular: Normal rate and regular rhythm.   Pulmonary/Chest: Effort normal and breath sounds normal.  Abdominal: Soft. Bowel sounds are normal. There is tenderness (Markedly tender upper abdomen, left greater than right.). There is no rebound and no guarding.  Musculoskeletal: Normal range of motion.  Neurological: She is alert. No cranial nerve deficit.  Skin: Skin is warm and dry. No rash noted.  Psychiatric: She has a normal mood and affect.     ED Treatments / Results  Labs (all labs ordered are listed, but only abnormal results are displayed) Labs Reviewed  COMPREHENSIVE METABOLIC PANEL - Abnormal; Notable for the following:       Result Value   Glucose, Bld 225 (*)    AST 46 (*)    All other components within normal limits  CBC - Abnormal; Notable for the following:    WBC 11.5 (*)    All other components within normal limits  LIPASE, BLOOD  URINALYSIS, ROUTINE W REFLEX MICROSCOPIC (NOT AT [facility])  PREGNANCY,  URINE    EKG  EKG Interpretation None       Radiology No results found.  Procedures Procedures (including critical care time)  Medications Ordered in ED Medications - No data to display   Initial Impression / Assessment and Plan / ED Course  I have reviewed the triage vital signs and the nursing notes.  Pertinent labs & imaging results that were available during my care of the patient were reviewed by me and considered in my medical decision making (see chart for details).  Clinical Course    Patient presents for evaluation of sharp, severe upper abdominal pain with history of pancreatitis. She has a normal lipase and LFT's - doubt pancreatic pain. She has a history of hiatal hernia  and reflux, EGD last year, on Protonix since that time at BID dosing. Her symptoms are described as burning when swallowing anything. GI cocktail with some improvement in symptoms.   Feel symptoms are related to esophagitis/reflux and treatable in outpatient setting. Will add carafate to regimen. Patient is comfortable with discharge home.   Final Clinical Impressions(s) / ED Diagnoses   Final diagnoses:  None   1. Esophagitis  New Prescriptions New Prescriptions   No medications on file     Elpidio Anis, Cordelia Poche 01/13/16 0014    Doug Sou, MD 01/13/16 [phone]

## 2016-01-13 MED ORDER — SUCRALFATE 1 GM/10ML PO SUSP: 1.0000 g | Freq: Three times a day (TID) | ORAL | 0 refills | Status: DC

## 2016-01-21 MED FILL — LANTUS SOLOSTAR 100 UNITS/M: 100 | 60 days supply | Qty: 15 | Fill #0

## 2016-01-24 DIAGNOSIS — J069 Acute upper respiratory infection, unspecified: Secondary | ICD-10-CM | POA: Diagnosis not present

## 2016-02-05 ENCOUNTER — Ambulatory Visit: Payer: Self-pay

## 2016-02-05 MED FILL — UNIFINE PENTIPS 8MM 31G: 31G X 8 MM | 30 days supply | Qty: 100 | Fill #1

## 2016-02-05 MED FILL — ELMIRON 100 MG CAPSULE: 100 | 30 days supply | Qty: 90 | Fill #0

## 2016-02-05 MED FILL — MONTELUKAST SOD 10 MG TAB: 10 | 90 days supply | Qty: 90 | Fill #0

## 2016-02-05 MED FILL — AMITRIPTYLINE HCL 50 MG TAB: 50 | 30 days supply | Qty: 30 | Fill #0

## 2016-02-05 MED FILL — LISINOPRIL 5 MG TABLET: 5 | 90 days supply | Qty: 90 | Fill #0

## 2016-02-05 MED FILL — FLUTICASONE PROP 50 MCG SPR: 50 | 30 days supply | Qty: 16 | Fill #1

## 2016-02-05 MED FILL — EZETIMIBE 10 MG TABLET: 10 | 30 days supply | Qty: 30 | Fill #2

## 2016-02-05 MED FILL — ATORVASTATIN 80 MG TABLET: 80 | 90 days supply | Qty: 90 | Fill #1

## 2016-02-10 ENCOUNTER — Ambulatory Visit (INDEPENDENT_AMBULATORY_CARE_PROVIDER_SITE_OTHER): Payer: 59

## 2016-02-10 ENCOUNTER — Ambulatory Visit (INDEPENDENT_AMBULATORY_CARE_PROVIDER_SITE_OTHER): Payer: 59 | Admitting: Family Medicine

## 2016-02-10 VITALS — BP 118/76 | HR 105 | Temp 98.2°F | Resp 20 | Ht 60.5 in | Wt 274.6 lb

## 2016-02-10 DIAGNOSIS — M25562 Pain in left knee: Secondary | ICD-10-CM

## 2016-02-10 DIAGNOSIS — G4733 Obstructive sleep apnea (adult) (pediatric): Secondary | ICD-10-CM | POA: Diagnosis not present

## 2016-02-10 MED ORDER — DICLOFENAC SODIUM 75 MG PO TBEC: 75.0000 mg | DELAYED_RELEASE_TABLET | Freq: Two times a day (BID) | ORAL | 0 refills | Status: DC

## 2016-02-10 MED FILL — DICLOFENAC SOD 75 MG TAB EC: 75 | 10 days supply | Qty: 20 | Fill #0

## 2016-02-10 NOTE — Patient Instructions (Addendum)
Great to meet you!  We will work on a referral  For now try the diclofenac, ice for 15 minutes at least 3 times a day, compression, and elevate the leg as often as possible.  Please come back if you have any questions or concerns.      IF you received an x-ray today, you will receive an invoice from [facility]. Please contact [facility] at [phone] with questions or concerns regarding your invoice.   IF you received labwork today, you will receive an invoice from [facility]. Please contact [facility] at [phone] with questions or concerns regarding your invoice.   Our billing staff will not be able to assist you with questions regarding bills from these companies.  You will be contacted with the lab results as soon as they are available. The fastest way to get your results is to activate your My Chart account. Instructions are located on the last page of this paperwork. If you have not heard from us regarding the results in 2 weeks, please contact this office.

## 2016-02-10 NOTE — Progress Notes (Addendum)
   HPI  Patient presents today here with left knee pain.  Patient states that she's had pain for about 2 days. About 3 days ago she slipped slightly in the rain without any serious pain at that time.  Over the last 48 hours she's had slowly progressive left knee pain, it's worse with standing and described as pressure-like pain on the anterior distal part of the knee as well as the popliteal fossa.  She has no obvious swelling but does feel like it's very swollen. She has tried ice with not much improvement.  She also has popping symptoms and feelings like it would give away today.  She works as a Clinical biochemist at [employer].  PMH: Smoking status noted Asthma history significant for diabetes, GERD, hyperlipidemia and migraines Surgical history for cholecystectomy, tubal ligation, and endometrial ablation Mother with history of cancer, father history of diabetes Current smoker ROS: Per HPI  Objective: BP 118/76 (BP Location: Right Arm, Patient Position: Sitting, Cuff Size: Large)   Pulse (!) 105   Temp 98.2 F (36.8 C) (Oral)   Resp 20   Ht 5' 0.5" (1.537 m)   Wt 274 lb 9.6 oz (124.6 kg)   SpO2 95%   BMI 52.75 kg/m  Gen: NAD, alert, cooperative with exam, obese HEENT: NCAT, EOMI, PERRL CV: RRR, good S1/S2, no murmur Resp: CTABL, no wheezes, non-labored Ext: No edema, warm Neuro: Alert and oriented, No gross deficits  MSK: L knee without erythema, effusion, bruising, or gross deformity Medial  joint line tenderness.  ligamentously intact to Lachman's and with varus and valgus stress.  Negative McMurray's test     Assessment and plan:  # Acute left knee pain Pressure-like pain after a simple slip 3 nights ago. Given the popping symptoms and likely swelling that's just difficult to appreciate, I am suspicious for meniscal injury. Given body habitus injection is very difficult. I have recommended supportive care including scheduled NSAIDs, ice, compression. voltaren  Rx'd Out of work for 3 days. Urgent referral to orthopedic surgery as I believe that she would benefit from knee injection, although it's too technically challenging to perform today.     Orders Placed This Encounter  Procedures  . DG Knee 1-2 Views Left    Standing Status:   Future    Number of Occurrences:   1    Standing Expiration Date:   04/11/2017    Order Specific Question:   Reason for Exam (SYMPTOM  OR DIAGNOSIS REQUIRED)    Answer:   knee pain    Order Specific Question:   Is the patient pregnant?    Answer:   No    Order Specific Question:   Preferred imaging location?    Answer:   External  . Ambulatory referral to Orthopedic Surgery    Referral Priority:   Urgent    Referral Type:   Surgical    Referral Reason:   Specialty Services Required    Requested Specialty:   Orthopedic Surgery    Number of Visits Requested:   1     Murtis Sink, MD 02/10/2016, 3:55 PM

## 2016-02-12 NOTE — Progress Notes (Signed)
Left message with [facility] Orthopedic triage nurse to schedule appointment for possible knee injection per Dr. Clelia Croft

## 2016-02-18 MED FILL — LYRICA 50 MG CAPSULE: 50 | 90 days supply | Qty: 90 | Fill #0

## 2016-02-22 MED FILL — JANUVIA 100 MG TABLET: 100 | 90 days supply | Qty: 90 | Fill #1

## 2016-02-22 MED FILL — CYCLOBENZAPRINE 10 MG TAB: 10 | 15 days supply | Qty: 45 | Fill #1

## 2016-02-24 ENCOUNTER — Ambulatory Visit (INDEPENDENT_AMBULATORY_CARE_PROVIDER_SITE_OTHER): Payer: 59 | Admitting: Orthopedic Surgery

## 2016-02-24 ENCOUNTER — Ambulatory Visit (INDEPENDENT_AMBULATORY_CARE_PROVIDER_SITE_OTHER): Payer: 59 | Admitting: Sports Medicine

## 2016-02-24 ENCOUNTER — Encounter (INDEPENDENT_AMBULATORY_CARE_PROVIDER_SITE_OTHER): Payer: Self-pay | Admitting: Sports Medicine

## 2016-02-24 VITALS — BP 110/74 | HR 104 | Ht 61.0 in | Wt 273.0 lb

## 2016-02-24 DIAGNOSIS — M25562 Pain in left knee: Secondary | ICD-10-CM

## 2016-02-24 MED ORDER — BUPIVACAINE HCL 0.5 % IJ SOLN
2.0000 mL | INTRAMUSCULAR | Status: AC | PRN
  Administered 2016-02-24: 2 mL via INTRA_ARTICULAR

## 2016-02-24 MED ORDER — METHYLPREDNISOLONE ACETATE 40 MG/ML IJ SUSP
80.0000 mg | INTRAMUSCULAR | Status: AC | PRN
  Administered 2016-02-24: 80 mg

## 2016-02-24 NOTE — Progress Notes (Signed)
Kelsey Vang - 40 y.o. female MRN [id number]  Date of birth: May 18, 1975  Office Visit Note: Visit Date: 02/24/2016 PCP: REDMON,NOELLE, PA-C Referred by: Elenora Gamma, MD  Subjective: Chief Complaint  Patient presents with  . Left Knee - Pain   HPI: Patient states left knee pain since October 25, went to urgent care and was told she may have torn her meniscus.  X-ray were taken.  Hurts when going from sitting to standing up.  Has pain shooting when moving left knee a certain way.  Some occasional clicking & popping but no locking or giving way. Some symptoms of instability that once again no associated falls. Pain began insidiously with no known injury. Diclofenac has only been minimally helpful.    ROS Otherwise per HPI.  Assessment & Plan: Visit Diagnoses: No diagnosis found.  Plan: Findings:  Injection today. Swelling has improved. I am concerned for potential meniscal tear & will have her call if any lack of improvement over the next 2 weeks for an MRI of the knee.    Meds & Orders: No orders of the defined types were placed in this encounter.   Orders Placed This Encounter  Procedures  . Large Joint Injection/Arthrocentesis    Follow-up: No Follow-up on file.   Procedures: Large Joint Inj Date/Time: 02/24/2016 11:14 AM Performed by: Gaspar Bidding D Authorized by: Gaspar Bidding D   Indications:  Pain and joint swelling Location:  Knee Site:  L knee Needle Size:  18 G Approach:  Superolateral Ultrasound Guidance: Yes   Fluoroscopic Guidance: No   Arthrogram: No Medications:  2 mL bupivacaine 0.5 %; 80 mg methylPREDNISolone acetate 40 MG/ML Aspiration Attempted: No   Comments: The patient's clinical condition is marked by substantial pain and/or significant functional disability. Other conservative therapy has not provided relief, is contraindicated, or not appropriate. There is a reasonable likelihood that injection will significantly improve the  patient's pain and/or functional impairment.  After discussing the risks, benefits and expected outcomes of the injection and all questions were reviewed and answered, the patient wished to undergo the above named procedure.  Verbal consent was obtained. The target sight was prepped with alcohol scrub. Local anesthesia was obtained with ethyl chloride and 5mL of 1% lidocaine on a 25g needle.  Under real-time ultrasound guidance, Injection of the target structure was performed using the above needle and medications under sterile technique. Band-Aid was applied. The patient tolerated this procedure well with no immediate complications. Post injection instructions were provided.        No notes on file   Clinical History: No specialty comments available.  She reports that she has been smoking Cigarettes.  She has been smoking about 1.00 pack per day. She has never used smokeless tobacco. No results for input(s): HGBA1C, LABURIC in the last 8760 hours.  Objective:  VS:  HT:5\' 1"  (154.9 cm)   WT:273 lb (123.8 kg)  BMI:51.7    BP:110/74  HR:(!) 104bpm  TEMP: ( )  RESP:  Physical Exam  Constitutional:  Obese female. In no acute distress. Alert & appropriately interactive. Bilateral lower extremities overall with slight genu valgus with large soft tissue envelope. She has tenderness to palpation along the medial & lateral joint lines more focally over the medial. Pain with valgus testing. No mechanical symptoms appreciated with McMurray's. Ligamentously stable. Extensor mechanism intact. No significant lower extremity pretibial edema. DP & PT pulses 2+/4. Lower extremity sensation intact. No pain with straight leg raise.  Ortho Exam Imaging: No results found.  Past Medical/Family/Surgical/Social History: Medications & Allergies reviewed per EMR Patient Active Problem List   Diagnosis Date Noted  . Obstructive apnea 04/23/2015  . Type 2 diabetes mellitus (HCC) 12/27/2013  . Current tobacco  use 12/27/2013  . Morbid (severe) obesity due to excess calories (HCC) 12/27/2013  . HLD (hyperlipidemia) 12/27/2013   Past Medical History:  Diagnosis Date  . Diabetes mellitus without complication (HCC)   . Hyperlipemia    Family History  Problem Relation Age of Onset  . Cancer Mother   . Diabetes Father    Past Surgical History:  Procedure Laterality Date  . CHOLECYSTECTOMY    . ENDOMETRIAL ABLATION    . GALLBLADDER SURGERY    . TUBAL LIGATION     Social History   Occupational History  . Not on file.   Social History Main Topics  . Smoking status: Current Every Day Smoker    Packs/day: 1.00    Types: Cigarettes  . Smokeless tobacco: Never Used  . Alcohol use No  . Drug use: No  . Sexual activity: Not on file

## 2016-02-26 DIAGNOSIS — N390 Urinary tract infection, site not specified: Secondary | ICD-10-CM | POA: Diagnosis not present

## 2016-02-26 DIAGNOSIS — E7800 Pure hypercholesterolemia, unspecified: Secondary | ICD-10-CM | POA: Diagnosis not present

## 2016-02-26 DIAGNOSIS — Z720 Tobacco use: Secondary | ICD-10-CM | POA: Diagnosis not present

## 2016-02-26 DIAGNOSIS — E119 Type 2 diabetes mellitus without complications: Secondary | ICD-10-CM | POA: Diagnosis not present

## 2016-02-26 MED FILL — LANTUS SOLOSTAR 100 UNITS/M: 100 | 90 days supply | Qty: 45 | Fill #0

## 2016-02-26 MED FILL — NOVOLOG FLEXPEN SYRINGE: 100 | 90 days supply | Qty: 15 | Fill #0

## 2016-03-02 MED FILL — METFORMIN HCL ER 500 MG TAB: 500 | 90 days supply | Qty: 180 | Fill #0

## 2016-03-02 MED FILL — AMITRIPTYLINE HCL 50 MG TAB: 50 | 90 days supply | Qty: 90 | Fill #0

## 2016-03-04 ENCOUNTER — Ambulatory Visit: Payer: Self-pay

## 2016-03-04 MED FILL — FLUCONAZOLE 150 MG TABLET: 150 | 7 days supply | Qty: 2 | Fill #0

## 2016-03-04 MED FILL — SULFAMETHOXAZOLE/TMP DS TAB: 800-160 | 10 days supply | Qty: 20 | Fill #0

## 2016-03-07 MED FILL — EZETIMIBE 10 MG TABLET: 10 | 30 days supply | Qty: 30 | Fill #3

## 2016-03-17 MED FILL — UNIFINE PENTIPS 8MM 31G: 31G X 8 MM | 30 days supply | Qty: 100 | Fill #2

## 2016-03-17 MED FILL — TRUE METRIX GLUCOSE TEST ST: 90 days supply | Qty: 200 | Fill #0

## 2016-03-17 MED FILL — PANTOPRAZOLE SOD DR 40 MG T: 40 | 30 days supply | Qty: 60 | Fill #5

## 2016-03-18 MED FILL — FLUTICASONE PROP 50 MCG SPR: 50 | 60 days supply | Qty: 16 | Fill #0

## 2016-04-04 ENCOUNTER — Ambulatory Visit (INDEPENDENT_AMBULATORY_CARE_PROVIDER_SITE_OTHER): Payer: 59 | Admitting: Physician Assistant

## 2016-04-04 ENCOUNTER — Other Ambulatory Visit: Payer: Self-pay

## 2016-04-04 VITALS — BP 102/62 | HR 113 | Resp 16 | Ht 62.0 in | Wt 276.2 lb

## 2016-04-04 VITALS — BP 124/72 | HR 107 | Temp 98.3°F | Resp 18 | Ht 62.0 in | Wt 276.0 lb

## 2016-04-04 DIAGNOSIS — H9203 Otalgia, bilateral: Secondary | ICD-10-CM | POA: Diagnosis not present

## 2016-04-04 DIAGNOSIS — E119 Type 2 diabetes mellitus without complications: Secondary | ICD-10-CM

## 2016-04-04 DIAGNOSIS — H6092 Unspecified otitis externa, left ear: Secondary | ICD-10-CM | POA: Diagnosis not present

## 2016-04-04 DIAGNOSIS — Z794 Long term (current) use of insulin: Principal | ICD-10-CM

## 2016-04-04 LAB — POCT CBC
Granulocyte percent: 66.1 %G (ref 37–80)
HCT, POC: 41 % (ref 37.7–47.9)
Hemoglobin: 14.3 g/dL (ref 12.2–16.2)
Lymph, poc: 3.7 — AB (ref 0.6–3.4)
MCH, POC: 30.4 pg (ref 27–31.2)
MCHC: 34.8 g/dL (ref 31.8–35.4)
MCV: 87.3 fL (ref 80–97)
MID (cbc): 0.5 (ref 0–0.9)
MPV: 8.2 fL (ref 0–99.8)
POC Granulocyte: 8.1 — AB (ref 2–6.9)
POC LYMPH PERCENT: 29.8 % (ref 10–50)
POC MID %: 4.1 % (ref 0–12)
Platelet Count, POC: 275 10*3/uL (ref 142–424)
RBC: 4.69 M/uL (ref 4.04–5.48)
RDW, POC: 14.2 %
WBC: 12.3 10*3/uL — AB (ref 4.6–10.2)

## 2016-04-04 MED ORDER — FLUTICASONE PROPIONATE 50 MCG/ACT NA SUSP: 2.0000 | Freq: Every day | NASAL | 12 refills | Status: DC

## 2016-04-04 MED ORDER — ACETIC ACID 2 % OT SOLN: 4.0000 [drp] | Freq: Three times a day (TID) | OTIC | 0 refills | Status: DC

## 2016-04-04 NOTE — Progress Notes (Signed)
Kelsey Vang  MRN: [id number] DOB: Feb 23, 1976  PCP: REDMON,NOELLE, PA-C  Subjective:  Pt is a 40 year old female PMH diabetes, OSA, morbid obesity who presents to clinic for ear pain.  Her pain is located below her left ear lobe and is mildly tender to touch. Her ears "feel wet" like when she gets out of the shower and needs to let them dry. 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  Recent Sinusitis infection - Treated with Augmentin two weeks ago. She felt 100% better after treatment.  Current smoker - 7 pack year history  Denies fever, chills, decreased hearing, ringing in her ears, headache, tooth pain.   Review of Systems  Constitutional: Negative for chills, diaphoresis, fatigue and fever.  HENT: Positive for ear pain. Negative for congestion, postnasal drip, rhinorrhea, sinus pressure, sneezing and sore throat.   Respiratory: Negative for cough, chest tightness, shortness of breath and wheezing.   Cardiovascular: Negative for chest pain and palpitations.  Gastrointestinal: Negative for abdominal pain, diarrhea, nausea and vomiting.  Neurological: Negative for weakness, light-headedness and headaches.    Patient Active Problem List   Diagnosis Date Noted  . Obstructive apnea 04/23/2015  . Type 2 diabetes mellitus (HCC) 12/27/2013  . Current tobacco use 12/27/2013  . Morbid (severe) obesity due to excess calories (HCC) 12/27/2013  . HLD (hyperlipidemia) 12/27/2013    Current Outpatient Prescriptions on File Prior to Visit  Medication Sig Dispense Refill  . amitriptyline (ELAVIL) 50 MG tablet Take 50 mg by mouth at bedtime.    Marland Kitchen atorvastatin (LIPITOR) 80 MG tablet Take 80 mg by mouth daily.    . cetirizine (ZYRTEC) 10 MG tablet Take 10 mg by mouth daily.    . Cholecalciferol (VITAMIN D) 2000 units CAPS Take 1 capsule by mouth daily.    . cyclobenzaprine (FLEXERIL) 10 MG tablet Take 10 mg by mouth 3  (three) times daily as needed for muscle spasms.    . fluticasone (FLONASE) 50 MCG/ACT nasal spray Place 1 spray into both nostrils 2 (two) times daily.    . insulin aspart (NOVOLOG) 100 UNIT/ML injection Inject 5 Units into the skin 3 (three) times daily before meals.    . insulin glargine (LANTUS) 100 UNIT/ML injection Inject 35 Units into the skin at bedtime.     . metFORMIN (GLUCOPHAGE-XR) 500 MG 24 hr tablet Take 1,000 mg by mouth daily with breakfast.    . montelukast (SINGULAIR) 10 MG tablet Take 10 mg by mouth at bedtime.    . Omega-3 Fatty Acids (FISH OIL) 1000 MG CAPS Take 1 capsule by mouth 2 (two) times daily.    . pantoprazole (PROTONIX) 40 MG tablet Take 40 mg by mouth 2 (two) times daily.     . pentosan polysulfate (ELMIRON) 100 MG capsule Take 100 mg by mouth 3 (three) times daily.    . pregabalin (LYRICA) 50 MG capsule Take 50 mg by mouth daily.     . sitaGLIPtin (JANUVIA) 100 MG tablet Take 100 mg by mouth daily.    Marland Kitchen albuterol (PROVENTIL HFA;VENTOLIN HFA) 108 (90 Base) MCG/ACT inhaler Inhale 2 puffs into the lungs every 6 (six) hours as needed for wheezing or shortness of breath. (Patient not taking: Reported on 04/04/2016) 1 Inhaler 0  . sucralfate (CARAFATE) 1 GM/10ML suspension Take 10 mLs (1 g total) by mouth 4 (four) times daily -  with meals and at  bedtime. (Patient not taking: Reported on 04/04/2016) 420 mL 0   No current facility-administered medications on file prior to visit.     Allergies  Allergen Reactions  . Gabapentin Other (See Comments)  . Keflex [Cephalexin] Rash     Objective:  BP 124/72 (BP Location: Right Arm, Patient Position: Sitting, Cuff Size: Large)   Pulse (!) 107   Temp 98.3 F (36.8 C) (Oral)   Resp 18   Ht 5\' 2"  (1.575 m)   Wt 276 lb (125.2 kg)   SpO2 96%   BMI 50.48 kg/m   Physical Exam  Constitutional: She is oriented to person, place, and time and well-developed, well-nourished, and in no distress. No distress.  HENT:  Right  Ear: Tympanic membrane is injected.  Left Ear: Tympanic membrane is bulging.  Mouth/Throat: Oropharynx is clear and moist and mucous membranes are normal. Dental caries present. No dental abscesses.  TTP left buccal mucosa. No erythema. Minor swelling. No stone palpated. No TTP dentition. No abscess, drainage, increased warmth appreciated.  Cardiovascular: Normal rate, regular rhythm and normal heart sounds.   Neurological: She is alert and oriented to person, place, and time. GCS score is 15.  Skin: Skin is warm and dry.  Psychiatric: Mood, memory, affect and judgment normal.  Vitals reviewed.  Results for orders placed or performed in visit on 04/04/16  POCT CBC  Result Value Ref Range   WBC 12.3 (A) 4.6 - 10.2 K/uL   Lymph, poc 3.7 (A) 0.6 - 3.4   POC LYMPH PERCENT 29.8 10 - 50 %L   MID (cbc) 0.5 0 - 0.9   POC MID % 4.1 0 - 12 %M   POC Granulocyte 8.1 (A) 2 - 6.9   Granulocyte percent 66.1 37 - 80 %G   RBC 4.69 4.04 - 5.48 M/uL   Hemoglobin 14.3 12.2 - 16.2 g/dL   HCT, POC 16.141.0 09.637.7 - 47.9 %   MCV 87.3 80 - 97 fL   MCH, POC 30.4 27 - 31.2 pg   MCHC 34.8 31.8 - 35.4 g/dL   RDW, POC 04.514.2 %   Platelet Count, POC 275 142 - 424 K/uL   MPV 8.2 0 - 99.8 fL   Assessment and Plan :  1. Inflammation of the external ear, left 2. Otalgia of both ears - acetic acid (VOSOL) 2 % otic solution; Place 4 drops into both ears 3 (three) times daily.  Dispense: 15 mL; Refill: 0 - fluticasone (FLONASE) 50 MCG/ACT nasal spray; Place 2 sprays into both nostrils daily.  Dispense: 16 g; Refill: 12 - POCT CBC - Suspect post-infection inflammation. Will treat with external drops at this time. Take 400 mg ibuprofen as directed for the next week. Use flonase 2 puffs each nostril twice a day.  F/u in 4 days for recheck.   Marco CollieWhitney Maddelynn Moosman, PA-C  [facility] 04/04/2016 4:09 PM

## 2016-04-04 NOTE — Patient Instructions (Addendum)
Take 400 mg ibuprofen as directed for the next week. Use flonase 2 puffs each nostril twice a day. Apply ear drops as prescribed.  Please follow-up in 4 days for recheck.  Thank you for coming in today. I hope you feel we met your needs.  Feel free to call [facility] if you have any questions or further requests.  Please consider signing up for MyChart if you do not already have it, as this is a great way to communicate with me.  Best,  Whitney McVey, PA-C     IF you received an x-ray today, you will receive an invoice from [facility]. Please contact [facility] at [phone] with questions or concerns regarding your invoice.   IF you received labwork today, you will receive an invoice from [facility]. Please contact [facility] at [phone] with questions or concerns regarding your invoice.   Our billing staff will not be able to assist you with questions regarding bills from these companies.  You will be contacted with the lab results as soon as they are available. The fastest way to get your results is to activate your My Chart account. Instructions are located on the last page of this paperwork. If you have not heard from [place] regarding the results in 2 weeks, please contact this office.

## 2016-04-04 NOTE — Patient Outreach (Signed)
[facility]) Care Management   04/04/2016  Kelsey Vang 06-11-1975 [id number]  Kelsey Vang is an 40 y.o. female.   Member seen for follow up office visit for Link to Wellness program for self management of Type 2 diabetes  Subjective: Member states that her blood sugars have been up since she injured her knee and had to have steroid injections.  States that she saw her provider last month and her hemoglobin A1C had gone up to 9.2%.  States she started her on meal time insulin.  States she is still having higher blood sugars ranging from 145-222 in the AM and 210-310 after meals.  States she referred her to see and endocrinologist and she is to see her on 04/22/16.  States she has not signed up for the [facility] program yet.  States she has stopped drinking [employer] or any soda with sugar.  States she has an ear ache and she is going to see a doctor later this afternoon.  States she has not been exercising as much since she hurt her knee but she still tries to walk at work some.  Objective:   Review of Systems  HENT: Positive for ear pain.   Reviewed glucometer 7 day average-249 14 day average-247 30 day average-243  Physical Exam Today's Vitals   04/04/16 1420 04/04/16 1425  BP: 102/62   Pulse: (!) 113   Resp: 16   SpO2: 95%   Weight: 276 lb 3.2 oz (125.3 kg)   Height: 1.575 m (5\' 2" )   PainSc: 2  2    Encounter Medications:   Outpatient Encounter Prescriptions as of 04/04/2016  Medication Sig  . amitriptyline (ELAVIL) 50 MG tablet Take 50 mg by mouth at bedtime.  Marland Kitchen atorvastatin (LIPITOR) 80 MG tablet Take 80 mg by mouth daily.  . cetirizine (ZYRTEC) 10 MG tablet Take 10 mg by mouth daily.  . Cholecalciferol (VITAMIN D) 2000 units CAPS Take 1 capsule by mouth daily.  . cyclobenzaprine (FLEXERIL) 10 MG tablet Take 10 mg by mouth 3 (three) times daily as needed for muscle spasms.  . fluticasone (FLONASE) 50 MCG/ACT nasal spray Place 1 spray into both  nostrils 2 (two) times daily.  . insulin aspart (NOVOLOG) 100 UNIT/ML injection Inject 5 Units into the skin 3 (three) times daily before meals.  . insulin glargine (LANTUS) 100 UNIT/ML injection Inject 35 Units into the skin at bedtime.   . metFORMIN (GLUCOPHAGE-XR) 500 MG 24 hr tablet Take 1,000 mg by mouth daily with breakfast.  . montelukast (SINGULAIR) 10 MG tablet Take 10 mg by mouth at bedtime.  . Omega-3 Fatty Acids (FISH OIL) 1000 MG CAPS Take 1 capsule by mouth 2 (two) times daily.  . pantoprazole (PROTONIX) 40 MG tablet Take 40 mg by mouth 2 (two) times daily.   . pentosan polysulfate (ELMIRON) 100 MG capsule Take 100 mg by mouth 3 (three) times daily.  . pregabalin (LYRICA) 50 MG capsule Take 50 mg by mouth daily.   . sitaGLIPtin (JANUVIA) 100 MG tablet Take 100 mg by mouth daily.  Marland Kitchen albuterol (PROVENTIL HFA;VENTOLIN HFA) 108 (90 Base) MCG/ACT inhaler Inhale 2 puffs into the lungs every 6 (six) hours as needed for wheezing or shortness of breath. (Patient not taking: Reported on 04/04/2016)  . diclofenac (VOLTAREN) 75 MG EC tablet Take 1 tablet (75 mg total) by mouth 2 (two) times daily. (Patient not taking: Reported on 04/04/2016)  . sucralfate (CARAFATE) 1 GM/10ML suspension Take 10 mLs (  1 g total) by mouth 4 (four) times daily -  with meals and at bedtime. (Patient not taking: Reported on 04/04/2016)   No facility-administered encounter medications on file as of 04/04/2016.     Functional Status:   In your present state of health, do you have any difficulty performing the following activities: 04/04/2016 10/30/2015  Hearing? N N  Vision? N N  Difficulty concentrating or making decisions? N N  Walking or climbing stairs? N N  Dressing or bathing? N N  Doing errands, shopping? N N    Fall/Depression Screening:    PHQ 2/9 Scores 04/04/2016 02/10/2016 10/30/2015 07/13/2015 06/20/2015  PHQ - 2 Score 0 0 0 0 0    Assessment:  Member seen for follow up office visit for Link to  Wellness program for self management of Type 2 diabetes. Member is not at diabetes self management goal of hemoglobin A1C of 7% or below with last reading increased to  9.2%.  Member had been receiving steroid injections.  Member now on meal time insulin along with a basal insulin but continues to have higher blood sugars.  Member to see endocrinologist on 04/22/16. Member reports she has stopped drinking regular soda daily. Members is walking at work but less due to knee pain.  Member is currently smoking and stopped wearing the patch. Member is over due for  annual eye exams and up to date with regular dental check ups. Member enrolled in [facility] during visit.  Plan:   Plan to eat 30-45 GM (2-3) servings of carbohydrate a meal and 15 GM for snacks.  Plan to eat protein with your snacks and eat a bedtime snack with protein Plan to check blood sugar twice a day fasting and 1 -2hrs after a meal.  Goals of 80-130 fasting and 180 or less after eating. Plan to walk 2 times a week for 30 minutes.  Goal of 150 minutes a week Plan to see Dr. Elvera LennoxGherghe on 04/22/16 [facility] Plan to follow through the [facility] in 2018  [facility] Care [facility] Problem One   Flowsheet Row Most Recent Value  Care Plan Problem One  Elevated blood sugars as evidenced by hemoglobin A1C 7.5% related to dx of Type 2 DM  Role Documenting the Problem One  Care Management Coordinator  Care Plan for Problem One  Active  [facility] Long Term Goal (31-90 days)  Member will decrease hemoglobin A1C to 7 or below in the next 90 days  [facility] Long Term Goal Start Date  04/04/16  Interventions for Problem One Long Term Goal  Reviewed CHO counting and portion  control, Prasied for stopping drinking [employer], Reinforced importance of losing weight, Given handout on the [place] [facility] [place] and instructed her Link to Wellness benefit will be connected to being active in [place], Encouraged to stop smoking and to  attend the [employer] smart classes, Reviewed blood sugar goals before and after eating, Instructed to keep her appt with endocrinologist on 04/22/16    Dudley MajorMelissa Quintin Hjort RN, [facility],CCM Care Management Coordinator-Link to Wellness [facility] Care Management [phone]

## 2016-04-04 NOTE — Patient Instructions (Signed)
1. Plan to eat 30-45 GM (2-3) servings of carbohydrate a meal and 15 GM for snacks.  Plan to eat protein with your snacks and eat a bedtime snack with protein 2. Plan to check blood sugar twice a day fasting and 1 -2hrs after a meal.  Goals of 80-130 fasting and 180 or less after eating. 3. Plan to walk 2 times a week for 30 minutes.  Goal of 150 minutes a week 4. Plan to see Dr. Elvera LennoxGherghe on 04/22/16 5. Plan to [facility] the [facility] 6. Plan to [facility] the [facility]

## 2016-04-05 MED FILL — ACETIC ACID 2% EAR SOLUTION: 2 | 7 days supply | Qty: 15 | Fill #0

## 2016-04-13 MED FILL — EZETIMIBE 10 MG TABLET: 10 | 30 days supply | Qty: 30 | Fill #0

## 2016-04-13 MED FILL — PANTOPRAZOLE SOD DR 40 MG T: 40 | 90 days supply | Qty: 90 | Fill #0

## 2016-04-13 MED FILL — ELMIRON 100 MG CAPSULE: 100 | 30 days supply | Qty: 90 | Fill #1

## 2016-04-22 ENCOUNTER — Encounter: Payer: Self-pay | Admitting: Internal Medicine

## 2016-04-22 ENCOUNTER — Ambulatory Visit (INDEPENDENT_AMBULATORY_CARE_PROVIDER_SITE_OTHER): Payer: 59 | Admitting: Internal Medicine

## 2016-04-22 VITALS — BP 124/82 | HR 105 | Ht 62.0 in | Wt 281.0 lb

## 2016-04-22 DIAGNOSIS — Z794 Long term (current) use of insulin: Secondary | ICD-10-CM

## 2016-04-22 DIAGNOSIS — E1165 Type 2 diabetes mellitus with hyperglycemia: Secondary | ICD-10-CM

## 2016-04-22 MED ORDER — METFORMIN HCL ER 500 MG PO TB24: 1000.0000 mg | ORAL_TABLET | Freq: Two times a day (BID) | ORAL | 3 refills | Status: DC

## 2016-04-22 MED ORDER — V-GO 30 KIT: PACK | 2 refills | Status: DC

## 2016-04-22 MED ORDER — INSULIN LISPRO 100 UNIT/ML ~~LOC~~ SOLN: SUBCUTANEOUS | 2 refills | Status: DC

## 2016-04-22 MED ORDER — SITAGLIPTIN PHOSPHATE 100 MG PO TABS: 100.0000 mg | ORAL_TABLET | Freq: Every day | ORAL | 3 refills | Status: DC

## 2016-04-22 MED FILL — METFORMIN HCL ER 500 MG TAB: 500 | 90 days supply | Qty: 360 | Fill #0

## 2016-04-22 MED FILL — HumaLOG 100 UNIT/ML SOLN: 100 | 43 days supply | Qty: 30 | Fill #0

## 2016-04-22 MED FILL — VGO 30 DISPOSABLE DEVICE: 30 days supply | Qty: 30 | Fill #0

## 2016-04-22 NOTE — Patient Instructions (Addendum)
Please increase Metformin ER to 1000 mg 2x a day.  Continue Januvia 100 mg before b'fast.  Stop Lantus and Novolog for now.  Start VGo 30: - smaller meal 3 clicks (6 units) - larger meal: 4 clicks (8 units) - snack: 1 click (2 units)  Please return in 1.5 months with your sugar log.   Please let me know if the sugars are consistently <80 or >200.  Please schedule an appt with Cristy Folks for diabetes education.  PATIENT INSTRUCTIONS FOR TYPE 2 DIABETES:  **Please join MyChart!** - see attached instructions about how to join if you have not done so already.  DIET AND EXERCISE Diet and exercise is an important part of diabetic treatment.  We recommended aerobic exercise in the form of brisk walking (working between 40-60% of maximal aerobic capacity, similar to brisk walking) for 150 minutes per week (such as 30 minutes five days per week) along with 3 times per week performing 'resistance' training (using various gauge rubber tubes with handles) 5-10 exercises involving the major muscle groups (upper body, lower body and core) performing 10-15 repetitions (or near fatigue) each exercise. Start at half the above goal but build slowly to reach the above goals. If limited by weight, joint pain, or disability, we recommend daily walking in a swimming pool with water up to waist to reduce pressure from joints while allow for adequate exercise.    BLOOD GLUCOSES Monitoring your blood glucoses is important for continued management of your diabetes. Please check your blood glucoses 2-4 times a day: fasting, before meals and at bedtime (you can rotate these measurements - e.g. one day check before the 3 meals, the next day check before 2 of the meals and before bedtime, etc.).   HYPOGLYCEMIA (low blood sugar) Hypoglycemia is usually a reaction to not eating, exercising, or taking too much insulin/ other diabetes drugs.  Symptoms include tremors, sweating, hunger, confusion, headache,  etc. Treat IMMEDIATELY with 15 grams of Carbs: . 4 glucose tablets .  cup regular juice/soda . 2 tablespoons raisins . 4 teaspoons sugar . 1 tablespoon honey Recheck blood glucose in 15 mins and repeat above if still symptomatic/blood glucose <100.  RECOMMENDATIONS TO REDUCE YOUR RISK OF DIABETIC COMPLICATIONS: * Take your prescribed MEDICATION(S) * Follow a DIABETIC diet: Complex carbs, fiber rich foods, (monounsaturated and polyunsaturated) fats * AVOID saturated/trans fats, high fat foods, >2,300 mg salt per day. * EXERCISE at least 5 times a week for 30 minutes or preferably daily.  * DO NOT SMOKE OR DRINK more than 1 drink a day. * Check your FEET every day. Do not wear tightfitting shoes. Contact us if you develop an ulcer * See your EYE doctor once a year or more if needed * Get a FLU shot once a year * Get a PNEUMONIA vaccine once before and once after age 60 years  GOALS:  * Your Hemoglobin A1c of <7%  * fasting sugars need to be <130 * after meals sugars need to be <180 (2h after you start eating) * Your Systolic BP should be 140 or lower  * Your Diastolic BP should be 80 or lower  * Your HDL (Good Cholesterol) should be 40 or higher  * Your LDL (Bad Cholesterol) should be 100 or lower. * Your Triglycerides should be 150 or lower  * Your Urine microalbumin (kidney function) should be <30 * Your Body Mass Index should be 25 or lower    Please consider the following ways to  cut down carbs and fat and increase fiber and micronutrients in your diet: - substitute whole grain for white bread or pasta - substitute brown rice for white rice - substitute 90-calorie flat bread pieces for slices of bread when possible - substitute sweet potatoes or yams for white potatoes - substitute humus for margarine - substitute tofu for cheese when possible - substitute almond or rice milk for regular milk (would not drink soy milk daily due to concern for soy estrogen influence on  breast cancer risk) - substitute dark chocolate for other sweets when possible - substitute water - can add lemon or orange slices for taste - for diet sodas (artificial sweeteners will trick your body that you can eat sweets without getting calories and will lead you to overeating and weight gain in the long run) - do not skip breakfast or other meals (this will slow down the metabolism and will result in more weight gain over time)  - can try smoothies made from fruit and almond/rice milk in am instead of regular breakfast - can also try old-fashioned (not instant) oatmeal made with almond/rice milk in am - order the dressing on the side when eating salad at a restaurant (pour less than half of the dressing on the salad) - eat as little meat as possible - can try juicing, but should not forget that juicing will get rid of the fiber, so would alternate with eating raw veg./fruits or drinking smoothies - use as little oil as possible, even when using olive oil - can dress a salad with a mix of balsamic vinegar and lemon juice, for e.g. - use agave nectar, stevia sugar, or regular sugar rather than artificial sweateners - steam or broil/roast veggies  - snack on veggies/fruit/nuts (unsalted, preferably) when possible, rather than processed foods - reduce or eliminate aspartame in diet (it is in diet sodas, chewing gum, etc) Read the labels!  Try to read Dr. Katherina Right book: "Program for Reversing Diabetes" for other ideas for healthy eating.

## 2016-04-22 NOTE — Progress Notes (Signed)
Patient ID: Kelsey Vang, female   DOB: 03/14/1976, 41 y.o.   MRN: [id number]   HPI: Kelsey Vang is a 41 y.o.-year-old female, referred by her PCP, REDMON,NOELLE, PA-C, for management of DM2, dx in 2010, insulin-dependent since 01/2016, uncontrolled, without long term complications. She is the wife of Kelsey Vang, who is also my pt.  Last hemoglobin A1c was: 02/26/2016: HbA1c 9.2%. Surprisingly, the fructosamine level checked at the same time was 271 (corresponding to the calculated HbA1c of 6.2%, which, however, does not correlate with the sugars checked at home) 04/07/2015: HbA1c 7.1% No results found for: HGBA1C  Pt is on a regimen of: - Metformin ER 1000 mg 1x a day, with b'fast (had N/D/AP with regular metformin). - Januvia 100 mg in a.m. for 5 mo (but has a h/o gall stones pancreatitis in 1995 and had flares afterwards, despite cholecystectomy) - Lantus 35 units at bedtime - Novolog 5 units 3x a day, before meals Tried Farxiga >> elevated Cr and Invokana >> yeast infection.  Pt checks her sugars 2x a day and they are: - am: 150-240 - 2h after b'fast: n/c - before lunch: n/c - 2h after lunch: n/c - before dinner: n/c - 2h after dinner: n/c - bedtime: 200-220 - nighttime: n/c No lows. Lowest sugar was 120 (sick); she has hypoglycemia awareness at 150.  Highest sugar was 500s (steroid inj), 300s OTW.  She had steroid inj in 02/2016.  Glucometer: True Metric >> will change to AccuCheck Guide  Pt's meals are: - Breakfast: instant oatmeal or yoghurt + banana - Lunch: salad + meat  - Dinner: meat + 1-2 veggies, less bread - Snacks: not eating past 7 pm; trail mix  - no CKD, last BUN/creatinine:  02/26/2016: Glucose 143, BUN/creatinine 22/0.63, EGFR 107 Lab Results  Component Value Date   BUN 12 01/12/2016   CREATININE 0.63 01/12/2016  On Lisinopril 5.  - last set of lipids: 02/26/2016: 160/179/52/71 No results found for: CHOL, HDL, LDLCALC, LDLDIRECT, TRIG,  CHOLHDL  She is on Lipitor 80 mg daily, Zetia. - last eye exam was in 2016. No DR. - no numbness and tingling in her feet. On Lyrica for carpal tunnel in hands and pain in feet from a previous injury.  Pt has FH of DM in father, MGM.  ROS: Constitutional: no weight gain/loss, no fatigue, no subjective hyperthermia/hypothermia, + nocturia Eyes: no blurry vision, no xerophthalmia ENT: no sore throat, no nodules palpated in throat, no dysphagia/odynophagia, + hoarseness Cardiovascular: no CP/SOB/palpitations/leg swelling Respiratory: + cough/no SOB Gastrointestinal: no N/V/D/C/+ acid reflux Musculoskeletal: + muscle/no joint aches Skin: no rashes Neurological: no tremors/numbness/tingling/dizziness, + HA  Psychiatric: no depression/anxiety  Past Medical History:  Diagnosis Date  . Diabetes mellitus without complication ([place])   . Hyperlipemia    Past Surgical History:  Procedure Laterality Date  . CHOLECYSTECTOMY    . ENDOMETRIAL ABLATION    . GALLBLADDER SURGERY    . TUBAL LIGATION     Social History   Social History  . Marital status: Married    Spouse name: Kelsey Vang   . Number of children: 1   Occupational History  . CMA   Social History Main Topics  . Smoking status: Current Every Day Smoker    Packs/day: 1.00    Types: Cigarettes  . Smokeless tobacco: Never Used  . Alcohol use No  . Drug use: No   Current Outpatient Prescriptions on File Prior to Visit  Medication Sig Dispense Refill  .  acetic acid (VOSOL) 2 % otic solution Place 4 drops into both ears 3 (three) times daily. 15 mL 0  . amitriptyline (ELAVIL) 50 MG tablet Take 50 mg by mouth at bedtime.    Marland Kitchen atorvastatin (LIPITOR) 80 MG tablet Take 80 mg by mouth daily.    . cetirizine (ZYRTEC) 10 MG tablet Take 10 mg by mouth daily.    . Cholecalciferol (VITAMIN D) 2000 units CAPS Take 1 capsule by mouth daily.    . cyclobenzaprine (FLEXERIL) 10 MG tablet Take 10 mg by mouth 3 (three) times daily as  needed for muscle spasms.    . fluticasone (FLONASE) 50 MCG/ACT nasal spray Place 1 spray into both nostrils 2 (two) times daily.    . insulin aspart (NOVOLOG) 100 UNIT/ML injection Inject 5 Units into the skin 3 (three) times daily before meals.    . insulin glargine (LANTUS) 100 UNIT/ML injection Inject 35 Units into the skin at bedtime.     . montelukast (SINGULAIR) 10 MG tablet Take 10 mg by mouth at bedtime.    . Omega-3 Fatty Acids (FISH OIL) 1000 MG CAPS Take 1 capsule by mouth 2 (two) times daily.    . pantoprazole (PROTONIX) 40 MG tablet Take 40 mg by mouth 2 (two) times daily.     . pentosan polysulfate (ELMIRON) 100 MG capsule Take 100 mg by mouth 3 (three) times daily.    . pregabalin (LYRICA) 50 MG capsule Take 50 mg by mouth daily.      No current facility-administered medications on file prior to visit.    Allergies  Allergen Reactions  . Gabapentin Other (See Comments)  . Levemir [Insulin Detemir] Dermatitis    Whelps at injection site  . Keflex [Cephalexin] Rash   Family History  Problem Relation Age of Onset  . Cancer Mother   . Diabetes Father    PE: BP 124/82   Pulse (!) 105   Ht '5\' 2"'  (1.575 m)   Wt 281 lb (127.5 kg)   LMP 04/15/2016   SpO2 98%   BMI 51.40 kg/m  Wt Readings from Last 3 Encounters:  04/22/16 281 lb (127.5 kg)  04/04/16 276 lb (125.2 kg)  04/04/16 276 lb 3.2 oz (125.3 kg)   Constitutional: obese, in NAD Eyes: PERRLA, EOMI, no exophthalmos ENT: moist mucous membranes, no thyromegaly, no cervical lymphadenopathy Cardiovascular: tachycardia, RR, No MRG Respiratory: CTA B Gastrointestinal: abdomen soft, NT, ND, BS+ Musculoskeletal: no deformities, strength intact in all 4 Skin: moist, warm, no rashes Neurological: no tremor with outstretched hands, DTR normal in all 4  ASSESSMENT: 1. DM2, insulin-dependent, uncontrolled, without long term complications, but with hypoglycemia  PLAN:  1. Patient with long-standing, uncontrolled  diabetes, on oral antidiabetic regimen + Basal-bolus insulin regimen, which became insufficient. Her sugars are high with slightly better values in the morning, but still hyperglycemic. - She is on a half maximal dose of metformin and I advised her to increase it to 1000 mg twice a day, since she has no problems tolerating the extended-release form. We discussed about GLP-1 receptor agonist, however, we cannot use these due to her history of pancreatitis. She is tolerating Januvia without problems for the last 5 months, so we can continue this for now. We will need to increase her mealtime insulin since her sugars are increasing throughout the day and I suggested a VGo patch pump. I explained how this works and she agrees to try it. Until she gets the VGo and gets it attached (  I referred her to the diabetes educator), I advised her to increase the mealtime insulin to 6 and 8 units depending on the size of the meal. - I suggested to:  Patient Instructions  Please increase Metformin ER to 1000 mg 2x a day.  Continue Januvia 100 mg before b'fast.  Stop Lantus and Novolog for now.  Start VGo 30: - smaller meal 3 clicks (6 units) - larger meal: 4 clicks (8 units) - snack: 1 click (2 units)  Please return in 1.5 months with your sugar log.   Please let me know if the sugars are consistently <80 or >200.  Please schedule an appt with Leonia Reader for diabetes education.  - Continue checking sugars at different times of the day - check 2-3 times a day, rotating checks - given sugar log and advised how to fill it and to bring it at next appt  - given foot care handout and explained the principles  - given instructions for hypoglycemia management "15-15 rule"  - advised for yearly eye exams  - Return to clinic in 1.5 mo with sugar log   Philemon Kingdom, MD PhD [facility] Endocrinology

## 2016-04-30 ENCOUNTER — Ambulatory Visit (INDEPENDENT_AMBULATORY_CARE_PROVIDER_SITE_OTHER): Payer: 59 | Admitting: Family Medicine

## 2016-04-30 ENCOUNTER — Ambulatory Visit (INDEPENDENT_AMBULATORY_CARE_PROVIDER_SITE_OTHER): Payer: 59

## 2016-04-30 VITALS — BP 110/80 | HR 114 | Temp 98.2°F | Resp 24 | Ht 60.5 in | Wt 275.6 lb

## 2016-04-30 DIAGNOSIS — M79644 Pain in right finger(s): Secondary | ICD-10-CM

## 2016-04-30 DIAGNOSIS — M7989 Other specified soft tissue disorders: Secondary | ICD-10-CM

## 2016-04-30 DIAGNOSIS — G5611 Other lesions of median nerve, right upper limb: Secondary | ICD-10-CM | POA: Diagnosis not present

## 2016-04-30 DIAGNOSIS — R202 Paresthesia of skin: Secondary | ICD-10-CM

## 2016-04-30 DIAGNOSIS — H9202 Otalgia, left ear: Secondary | ICD-10-CM | POA: Diagnosis not present

## 2016-04-30 MED ORDER — INDOMETHACIN 50 MG PO CAPS: 50.0000 mg | ORAL_CAPSULE | Freq: Two times a day (BID) | ORAL | 0 refills | Status: DC

## 2016-04-30 NOTE — Progress Notes (Signed)
Chief Complaint  Patient presents with  . Hand Pain    Right thumb, pain started Monday morning, NKI  . Ear Pain    Left, X Thursday    HPI  Hand Injury Pt reports that on Monday she woke up with swelling of the thumb but now she has thumb, index and middle digit numbness with a pins and needles sensation.  She works as a Technical brewer She denies carpal tunnel diagnosis but reports that she uses splints at night to help with numbness in her fingers denies history of arthritis  Denies history of gout   Diabetes Mellitus: She reports that her diabetes is uncontrolled. No results found for: HGBA1C  A1C November 2017 was 9 Her endocrinologist is making some changes Her fasting sugars are usually over 200 She is on hemalog and using a insulin machine to deliver basal insulin daily all day  Ear Pain She has left ear that feels like pressure She denies any trauma to the ear No tinnitus No dizziness or balance issues  Past Medical History:  Diagnosis Date  . Diabetes mellitus without complication ([place])   . Hyperlipemia     Current Outpatient Prescriptions  Medication Sig Dispense Refill  . acetic acid (VOSOL) 2 % otic solution Place 4 drops into both ears 3 (three) times daily. 15 mL 0  . amitriptyline (ELAVIL) 50 MG tablet Take 50 mg by mouth at bedtime.    Marland Kitchen atorvastatin (LIPITOR) 80 MG tablet Take 80 mg by mouth daily.    . cetirizine (ZYRTEC) 10 MG tablet Take 10 mg by mouth daily.    . Cholecalciferol (VITAMIN D) 2000 units CAPS Take 1 capsule by mouth daily.    . cyclobenzaprine (FLEXERIL) 10 MG tablet Take 10 mg by mouth 3 (three) times daily as needed for muscle spasms.    . fluticasone (FLONASE) 50 MCG/ACT nasal spray Place 1 spray into both nostrils 2 (two) times daily.    . Insulin Disposable Pump (V-GO 30) KIT Use 1 reservoir a day 30 kit 2  . insulin lispro (HUMALOG) 100 UNIT/ML injection Use up to 70 units a day in the VGo 30 mL 2  . lisinopril (PRINIVIL,ZESTRIL) 5 MG  tablet TAKE 1 TABLET EVERY DAY    . metFORMIN (GLUCOPHAGE-XR) 500 MG 24 hr tablet Take 2 tablets (1,000 mg total) by mouth 2 (two) times daily with a meal. 360 tablet 3  . montelukast (SINGULAIR) 10 MG tablet Take 10 mg by mouth at bedtime.    . Omega-3 Fatty Acids (FISH OIL) 1000 MG CAPS Take 1 capsule by mouth 2 (two) times daily.    . pantoprazole (PROTONIX) 40 MG tablet Take 40 mg by mouth 2 (two) times daily.     . pentosan polysulfate (ELMIRON) 100 MG capsule Take 100 mg by mouth 3 (three) times daily.    . pregabalin (LYRICA) 50 MG capsule Take 50 mg by mouth daily.     . sitaGLIPtin (JANUVIA) 100 MG tablet Take 1 tablet (100 mg total) by mouth daily. 90 tablet 3  . insulin aspart (NOVOLOG) 100 UNIT/ML injection Inject 5 Units into the skin 3 (three) times daily before meals.    . insulin glargine (LANTUS) 100 UNIT/ML injection Inject 35 Units into the skin at bedtime.      No current facility-administered medications for this visit.     Allergies:  Allergies  Allergen Reactions  . Gabapentin Other (See Comments)  . Levemir [Insulin Detemir] Dermatitis    Whelps at  injection site  . Keflex [Cephalexin] Rash    Past Surgical History:  Procedure Laterality Date  . CHOLECYSTECTOMY    . ENDOMETRIAL ABLATION    . GALLBLADDER SURGERY    . TUBAL LIGATION      Social History   Social History  . Marital status: Married    Spouse name: N/A  . Number of children: N/A  . Years of education: N/A   Social History Main Topics  . Smoking status: Current Every Day Smoker    Packs/day: 1.00    Types: Cigarettes  . Smokeless tobacco: Never Used  . Alcohol use No  . Drug use: No  . Sexual activity: Not Asked   Other Topics Concern  . None   Social History Narrative  . None    Review of Systems  Constitutional: Negative for chills, fever, malaise/fatigue and weight loss.  HENT: Positive for ear pain.        See hpi  Respiratory: Negative for cough, shortness of breath and  wheezing.   Cardiovascular: Negative for chest pain, palpitations and orthopnea.  Gastrointestinal: Negative for abdominal pain, diarrhea, nausea and vomiting.  Genitourinary: Negative for dysuria, frequency and urgency.  Musculoskeletal: Negative for back pain, joint pain and myalgias.  Neurological: Positive for tingling. Negative for dizziness, tremors, seizures, loss of consciousness and headaches.       See hpi  Psychiatric/Behavioral: Negative for depression. The patient is not nervous/anxious and does not have insomnia.     Objective: Vitals:   04/30/16 1332  BP: 110/80  Pulse: (!) 114  Resp: (!) 24  Temp: 98.2 F (36.8 C)  TempSrc: Oral  SpO2: 97%  Weight: 275 lb 9.6 oz (125 kg)  Height: 5' 0.5" (1.537 m)    Physical Exam  Constitutional: She is oriented to person, place, and time. She appears well-developed and well-nourished.  HENT:  Head: Normocephalic and atraumatic.  Right Ear: External ear normal.  Left Ear: External ear normal.  Nose: Nose normal.  Mouth/Throat: Oropharynx is clear and moist. No oropharyngeal exudate.  Eyes: Conjunctivae and EOM are normal. Pupils are equal, round, and reactive to light.  Neck: Normal range of motion. Neck supple.  Cardiovascular: Normal rate, regular rhythm, normal heart sounds and intact distal pulses.   No murmur heard. Pulmonary/Chest: Effort normal and breath sounds normal. No respiratory distress. She has no wheezes.  Abdominal: Soft. Bowel sounds are normal. She exhibits no distension and no mass. There is no tenderness. There is no guarding.  Musculoskeletal: Normal range of motion. She exhibits no edema.  Neurological: She is alert and oriented to person, place, and time. No cranial nerve deficit.  Skin: Skin is warm. Capillary refill takes less than 2 seconds. No erythema.     Study Result   CLINICAL DATA:  Right hand paresthesia  EXAM: RIGHT WRIST - COMPLETE 3+ VIEW  COMPARISON:   None.  FINDINGS: There is no evidence of fracture or dislocation. There is no evidence of arthropathy or other focal bone abnormality. Soft tissues are unremarkable.  IMPRESSION: Negative.   Electronically Signed   By: Lucrezia Europe M.D.   On: 04/30/2016 14:05   CLINICAL DATA:  acute thumb pain and swelling  EXAM: RIGHT THUMB 2+V  COMPARISON:  None.  FINDINGS: There is no evidence of fracture or dislocation. There is no evidence of arthropathy or other focal bone abnormality. Soft tissues are unremarkable  IMPRESSION: Negative.   Electronically Signed   By: Eden Emms.D.  On: 04/30/2016 14:05  Assessment and Plan Halia was seen today for hand pain and ear pain.  Diagnoses and all orders for this visit:  Thumb pain, right- discussed that she probably has neuropathy No acute injury or soft tissue edema -     DG Finger Thumb Right -     DG Wrist Complete Right  Right hand paresthesia- likely neuropathy due to diabetes Discussed acute injury vs. Median nerve injury -     DG Finger Thumb Right -     DG Wrist Complete Right  Right median nerve neuropathy- discussed that she should discuss evaluation with her PCP Continue wearing brace  Swelling of right thumb- though gout is unlikely inflammatory arthritis is still on the differential All labs are normal -     CBC -     Uric Acid -     Basic metabolic panel  Otalgia left ear Normal external exam and normal TM Advised antihistamine   Dontea Corlew A Nolon Rod

## 2016-04-30 NOTE — Patient Instructions (Addendum)
     IF you received an x-ray today, you will receive an invoice from [facility]. Please contact [facility] at [phone] with questions or concerns regarding your invoice.   IF you received labwork today, you will receive an invoice from [facility]. Please contact [facility] at [phone] with questions or concerns regarding your invoice.   Our billing staff will not be able to assist you with questions regarding bills from these companies.  You will be contacted with the lab results as soon as they are available. The fastest way to get your results is to activate your My Chart account. Instructions are located on the last page of this paperwork. If you have not heard from [place] regarding the results in 2 weeks, please contact this office.     Paresthesia Introduction Paresthesia is an abnormal burning or prickling sensation. This sensation is generally felt in the hands, arms, legs, or feet. However, it may occur in any part of the body. Usually, it is not painful. The feeling may be described as:  Tingling or numbness.  Pins and needles.  Skin crawling.  Buzzing.  Limbs falling asleep.  Itching. Most people experience temporary (transient) paresthesia at some time in their lives. Paresthesia may occur when you breathe too quickly (hyperventilation). It can also occur without any apparent cause. Commonly, paresthesia occurs when pressure is placed on a nerve. The sensation quickly goes away after the pressure is removed. For some people, however, paresthesia is a long-lasting (chronic) condition that is caused by an underlying disorder. If you continue to have paresthesia, you may need further medical evaluation. Follow these instructions at home: Watch your condition for any changes. Taking the following actions may help to lessen any discomfort that you are feeling:  Avoid drinking alcohol.  Try acupuncture or massage to help relieve your symptoms.  Keep all  follow-up visits as directed by your health care provider. This is important. Contact a health care provider if:  You continue to have episodes of paresthesia.  Your burning or prickling feeling gets worse when you walk.  You have pain, cramps, or dizziness.  You develop a rash. Get help right away if:  You feel weak.  You have trouble walking or moving.  You have problems with speech, understanding, or vision.  You feel confused.  You cannot control your bladder or bowel movements.  You have numbness after an injury.  You faint. This information is not intended to replace advice given to you by your health care provider. Make sure you discuss any questions you have with your health care provider. Document Released: 03/25/2002 Document Revised: 09/10/2015 Document Reviewed: 03/31/2014  2017 Elsevier

## 2016-05-01 LAB — BASIC METABOLIC PANEL
BUN/Creatinine Ratio: 14 (ref 9–23)
BUN: 10 mg/dL (ref 6–24)
CALCIUM: 9.9 mg/dL (ref 8.7–10.2)
CO2: 25 mmol/L (ref 18–29)
CREATININE: 0.7 mg/dL (ref 0.57–1.00)
Chloride: 99 mmol/L (ref 96–106)
GFR calc Af Amer: 125 mL/min/{1.73_m2} (ref >59)
GFR, EST NON AFRICAN AMERICAN: 109 mL/min/{1.73_m2} (ref >59)
Glucose: 158 mg/dL — ABNORMAL HIGH (ref 65–99)
Potassium: 4.5 mmol/L (ref 3.5–5.2)
Sodium: 140 mmol/L (ref 134–144)

## 2016-05-01 LAB — URIC ACID: Uric Acid: 5.2 mg/dL (ref 2.5–7.1)

## 2016-05-01 LAB — CBC
HEMATOCRIT: 40.3 % (ref 34.0–46.6)
Hemoglobin: 14.2 g/dL (ref 11.1–15.9)
MCH: 30.7 pg (ref 26.6–33.0)
MCHC: 35.2 g/dL (ref 31.5–35.7)
MCV: 87 fL (ref 79–97)
Platelets: 297 10*3/uL (ref 150–379)
RBC: 4.63 x10E6/uL (ref 3.77–5.28)
RDW: 14.4 % (ref 12.3–15.4)
WBC: 11.3 10*3/uL — AB (ref 3.4–10.8)

## 2016-05-03 MED FILL — LISINOPRIL 5 MG TABLET: 5 | 90 days supply | Qty: 90 | Fill #0

## 2016-05-03 MED FILL — CYCLOBENZAPRINE 10 MG TAB: 10 | 15 days supply | Qty: 45 | Fill #0

## 2016-05-03 MED FILL — MONTELUKAST SOD 10 MG TAB: 10 | 90 days supply | Qty: 90 | Fill #0

## 2016-05-07 ENCOUNTER — Ambulatory Visit (INDEPENDENT_AMBULATORY_CARE_PROVIDER_SITE_OTHER): Payer: 59 | Admitting: Family Medicine

## 2016-05-07 VITALS — BP 120/78 | HR 120 | Temp 98.0°F | Resp 17 | Ht 62.0 in | Wt 278.0 lb

## 2016-05-07 DIAGNOSIS — M67432 Ganglion, left wrist: Secondary | ICD-10-CM | POA: Diagnosis not present

## 2016-05-07 DIAGNOSIS — M79641 Pain in right hand: Secondary | ICD-10-CM | POA: Diagnosis not present

## 2016-05-07 DIAGNOSIS — G5603 Carpal tunnel syndrome, bilateral upper limbs: Secondary | ICD-10-CM

## 2016-05-07 DIAGNOSIS — M25541 Pain in joints of right hand: Secondary | ICD-10-CM

## 2016-05-07 MED ORDER — INDOMETHACIN 50 MG PO CAPS: 50.0000 mg | ORAL_CAPSULE | Freq: Three times a day (TID) | ORAL | 0 refills | Status: DC

## 2016-05-07 NOTE — Patient Instructions (Addendum)
     IF you received an x-ray today, you will receive an invoice from [facility]. Please contact [facility] at [phone] with questions or concerns regarding your invoice.   IF you received labwork today, you will receive an invoice from [facility]. Please contact [facility] at [phone] with questions or concerns regarding your invoice.   Our billing staff will not be able to assist you with questions regarding bills from these companies.  You will be contacted with the lab results as soon as they are available. The fastest way to get your results is to activate your My Chart account. Instructions are located on the last page of this paperwork. If you have not heard from us regarding the results in 2 weeks, please contact this office.     Joint Pain Joint pain, which is also called arthralgia, can be caused by many things. Joint pain often goes away when you follow your health care provider's instructions for relieving pain at home. However, joint pain can also be caused by conditions that require further treatment. Common causes of joint pain include:  Bruising in the area of the joint.  Overuse of the joint.  Wear and tear on the joints that occur with aging (osteoarthritis).  Various other forms of arthritis.  A buildup of a crystal form of uric acid in the joint (gout).  Infections of the joint (septic arthritis) or of the bone (osteomyelitis). Your health care provider may recommend medicine to help with the pain. If your joint pain continues, additional tests may be needed to diagnose your condition. Follow these instructions at home: Watch your condition for any changes. Follow these instructions as directed to lessen the pain that you are feeling.  Take medicines only as directed by your health care provider.  Rest the affected area for as long as your health care provider says that you should. If directed to do so, raise the painful joint above the  level of your heart while you are sitting or lying down.  Do not do things that cause or worsen pain.  If directed, apply ice to the painful area:  Put ice in a plastic bag.  Place a towel between your skin and the bag.  Leave the ice on for 20 minutes, 2-3 times per day.  Wear an elastic bandage, splint, or sling as directed by your health care provider. Loosen the elastic bandage or splint if your fingers or toes become numb and tingle, or if they turn cold and blue.  Begin exercising or stretching the affected area as directed by your health care provider. Ask your health care provider what types of exercise are safe for you.  Keep all follow-up visits as directed by your health care provider. This is important. Contact a health care provider if:  Your pain increases, and medicine does not help.  Your joint pain does not improve within 3 days.  You have increased bruising or swelling.  You have a fever.  You lose 10 lb (4.5 kg) or more without trying. Get help right away if:  You are not able to move the joint.  Your fingers or toes become numb or they turn cold and blue. This information is not intended to replace advice given to you by your health care provider. Make sure you discuss any questions you have with your health care provider. Document Released: 04/04/2005 Document Revised: 09/04/2015 Document Reviewed: 01/14/2014 Elsevier Interactive Patient Education  2017 Elsevier Inc.  

## 2016-05-07 NOTE — Progress Notes (Signed)
Subjective:  By signing my name below, I, Moises Blood, attest that this documentation has been prepared under the direction and in the presence of Delman Cheadle, MD. Electronically Signed: Moises Blood, Curtis. 05/07/2016 , 11:38 AM .  Patient was seen in Room 12 .   Patient ID: Kelsey Vang, female    DOB: 1976-03-17, 41 y.o.   MRN: [id number] Chief Complaint  Patient presents with  . Follow-up    Thumb    HPI Kelsey Vang is a 41 y.o. female who presents to Primary Care at [facility] for follow up on her right thumb. She was seen 1 week ago prior by Dr. Nolon Rod. She had 1 week of thumb swelling that had since spread to her index and middle digits with numbness, as well as pins and needles sensation. She has history of carpal tunnel and wears night splints. Her wrist and thumb xrays were normal. Patient was advised to follow up with PCP for possible worsening of carpal tunnel versus diabetic neuropathy. Gout was ruled out with normal uric acid. Patient was also complaining of left ear pressure, but normal exam. She was advised to take OTC anti histamine. Patient is on indomethacin 76m bid. She is an insulin dependent diabetic, followed by Dr. GCruzita Lederer   Patient states she's gotten better on the indomethacin but still has some stiffness and swelling. She feels some popping in her thumb joint. She has been wearing her night brace for a couple years. She denies history of gout, but her father has history of gout. She denies family history of unusual arthritis. She works as a mPsychologist, sport and exercise She also notes ganglion cyst appearing over her wrist.   Past Medical History:  Diagnosis Date  . Diabetes mellitus without complication ([place]   . Hyperlipemia    Prior to Admission medications   Medication Sig Start Date End Date Taking? Authorizing Provider  acetic acid (VOSOL) 2 % otic solution Place 4 drops into both ears 3 (three) times daily. 04/04/16  Yes Elizabeth Whitney McVey, PA-C    amitriptyline (ELAVIL) 50 MG tablet Take 50 mg by mouth at bedtime.   Yes Historical Provider, MD  atorvastatin (LIPITOR) 80 MG tablet Take 80 mg by mouth daily.   Yes Historical Provider, MD  cetirizine (ZYRTEC) 10 MG tablet Take 10 mg by mouth daily.   Yes Historical Provider, MD  Cholecalciferol (VITAMIN D) 2000 units CAPS Take 1 capsule by mouth daily.   Yes Historical Provider, MD  cyclobenzaprine (FLEXERIL) 10 MG tablet Take 10 mg by mouth 3 (three) times daily as needed for muscle spasms.   Yes Historical Provider, MD  fluticasone (FLONASE) 50 MCG/ACT nasal spray Place 1 spray into both nostrils 2 (two) times daily.   Yes Historical Provider, MD  indomethacin (INDOCIN) 50 MG capsule Take 1 capsule (50 mg total) by mouth 2 (two) times daily with a meal. 04/30/16  Yes Zoe A Stallings, MD  insulin aspart (NOVOLOG) 100 UNIT/ML injection Inject 5 Units into the skin 3 (three) times daily before meals.   Yes Historical Provider, MD  Insulin Disposable Pump (V-GO 30) KIT Use 1 reservoir a day 04/22/16  Yes CPhilemon Kingdom MD  insulin glargine (LANTUS) 100 UNIT/ML injection Inject 35 Units into the skin at bedtime.    Yes Historical Provider, MD  insulin lispro (HUMALOG) 100 UNIT/ML injection Use up to 70 units a day in the VGo 04/22/16  Yes CPhilemon Kingdom MD  lisinopril (PRINIVIL,ZESTRIL) 5 MG tablet TAKE 1 TABLET  EVERY DAY 01/29/15  Yes Historical Provider, MD  metFORMIN (GLUCOPHAGE-XR) 500 MG 24 hr tablet Take 2 tablets (1,000 mg total) by mouth 2 (two) times daily with a meal. 04/22/16  Yes Philemon Kingdom, MD  montelukast (SINGULAIR) 10 MG tablet Take 10 mg by mouth at bedtime.   Yes Historical Provider, MD  Omega-3 Fatty Acids (FISH OIL) 1000 MG CAPS Take 1 capsule by mouth 2 (two) times daily.   Yes Historical Provider, MD  pantoprazole (PROTONIX) 40 MG tablet Take 40 mg by mouth 2 (two) times daily.    Yes Historical Provider, MD  pentosan polysulfate (ELMIRON) 100 MG capsule Take 100 mg by  mouth 3 (three) times daily.   Yes Historical Provider, MD  pregabalin (LYRICA) 50 MG capsule Take 50 mg by mouth daily.    Yes Historical Provider, MD  sitaGLIPtin (JANUVIA) 100 MG tablet Take 1 tablet (100 mg total) by mouth daily. 04/22/16  Yes Philemon Kingdom, MD   Allergies  Allergen Reactions  . Gabapentin Other (See Comments)  . Levemir [Insulin Detemir] Dermatitis    Whelps at injection site  . Keflex [Cephalexin] Rash   Review of Systems  Constitutional: Negative for chills, fatigue, fever and unexpected weight change.  Respiratory: Negative for cough.   Gastrointestinal: Negative for constipation, diarrhea, nausea and vomiting.  Musculoskeletal: Positive for arthralgias and joint swelling.  Skin: Negative for rash and wound.  Neurological: Negative for dizziness, weakness and headaches.       Objective:   Physical Exam  Constitutional: She is oriented to person, place, and time. She appears well-developed and well-nourished. No distress.  HENT:  Head: Normocephalic and atraumatic.  Eyes: EOM are normal. Pupils are equal, round, and reactive to light.  Neck: Neck supple.  Cardiovascular: Normal rate.   Pulmonary/Chest: Effort normal. No respiratory distress.  Musculoskeletal: Normal range of motion.  Right hand: no tenderness over the MCP but positive severe tenderness at first mcp, and some tenderness between the first and second, as well as second and third  Neurological: She is alert and oriented to person, place, and time.  Skin: Skin is warm and dry.  Psychiatric: She has a normal mood and affect. Her behavior is normal.  Nursing note and vitals reviewed.   BP 120/78 (BP Location: Right Arm, Patient Position: Sitting, Cuff Size: Normal)   Pulse (!) 120   Temp 98 F (36.7 C) (Oral)   Resp 17   Ht '5\' 2"'  (1.575 m)   Wt 278 lb (126.1 kg)   LMP 04/18/2016   SpO2 96%   BMI 50.85 kg/m     Assessment & Plan:   1. Metacarpophalangeal joint pain of right hand     2. Bilateral carpal tunnel syndrome   3. Ganglion cyst of wrist, left   Pt was hoping to have first MCP joint aspirated for diagnostic studies and poss steroid inj but I do not feel comfortable doing this procedure as I have never asp/inj such a tiny joint and her effusion and ganglion cyst are just mild in severity.  Will refer to hand surg since she has not had any improvement with conservative measures. Cont RICE.  Orders Placed This Encounter  Procedures  . C-reactive protein  . Sedimentation rate  . Ambulatory referral to Hand Surgery    Referral Priority:   Routine    Referral Type:   Surgical    Referral Reason:   Specialty Services Required    Requested Specialty:   Hand Surgery  Number of Visits Requested:   1  . Care order/instruction:    AVS printed - let patient go!    Meds ordered this encounter  Medications  . indomethacin (INDOCIN) 50 MG capsule    Sig: Take 1 capsule (50 mg total) by mouth 3 (three) times daily with meals.    Dispense:  60 capsule    Refill:  0    I personally performed the services described in this documentation, which was scribed in my presence. The recorded information has been reviewed and considered, and addended by me as needed.   Delman Cheadle, M.D.  [facility] [address] [place], [place] [address] [phone] phone [phone] fax  05/17/16 10:16 PM

## 2016-05-09 LAB — SEDIMENTATION RATE: SED RATE: 2 mm/h (ref 0–32)

## 2016-05-09 LAB — C-REACTIVE PROTEIN: CRP: 3 mg/L (ref 0.0–4.9)

## 2016-05-10 DIAGNOSIS — G4733 Obstructive sleep apnea (adult) (pediatric): Secondary | ICD-10-CM | POA: Diagnosis not present

## 2016-05-11 ENCOUNTER — Telehealth: Payer: Self-pay | Admitting: Nutrition

## 2016-05-11 ENCOUNTER — Encounter: Payer: 59 | Attending: Internal Medicine | Admitting: Nutrition

## 2016-05-11 ENCOUNTER — Encounter: Payer: 59 | Admitting: Nutrition

## 2016-05-11 DIAGNOSIS — E1165 Type 2 diabetes mellitus with hyperglycemia: Secondary | ICD-10-CM | POA: Insufficient documentation

## 2016-05-11 DIAGNOSIS — Z794 Long term (current) use of insulin: Secondary | ICD-10-CM | POA: Diagnosis not present

## 2016-05-11 DIAGNOSIS — Z713 Dietary counseling and surveillance: Secondary | ICD-10-CM | POA: Diagnosis not present

## 2016-05-11 DIAGNOSIS — E119 Type 2 diabetes mellitus without complications: Secondary | ICD-10-CM

## 2016-05-11 NOTE — Telephone Encounter (Signed)
Patient calling to schedule appt to 4

## 2016-05-11 NOTE — Progress Notes (Signed)
Patient was instructed on how to fill, apply and use the V-Go.  She re demonstrated how to fill this correctly, and filled it with Novolog insulin.  She applied it to her upper right abdomen with very little assistance from me.  She verbalized the need to take 3 clicks for smaller meals and 4 clicks for larger meals, and 1 click for snacks.  She verbalized that each click delivers 2 units. She was reminded to stop her lantus, and she reported good understanding of this. She was given a log sheet to record her blood sugars.  She was told to test before meals and at bedtime, and to call the results to the office in 7 days.  She agreed to do this.  She had no final questions.

## 2016-05-11 NOTE — Patient Instructions (Signed)
Fill and apply a new V-Go 30 every 24 hours with Novolog insulin Stop Lantus insulin Test blood sugars before meals and at bedtime, and to call the results into the office in 7 days.   Call office sooner if blood sugars drop low.

## 2016-05-14 ENCOUNTER — Telehealth: Payer: Self-pay | Admitting: Nutrition

## 2016-05-14 NOTE — Telephone Encounter (Signed)
05/13/16  5PM: Blood sugars: Date   FBS   2hr.pcB   acL   2hr.pcL   acS  2hr.pcS  HS 1/25   200     185         220     227        199   230        236 1/26   241  Plan:  Basal rate increased from 1.6 to 1.7, and I/C ratio changed from 6 to 5 (pt. Is eating only 15-30 carbs per meal) She was told to call me tomorrow with blood sugar readings

## 2016-05-14 NOTE — Telephone Encounter (Signed)
05/11/16 9PM: Patient reported no difficulty giving self bolus before supper tonight.  Blood sugar was 126 2 hours after supper.  She was told to test at HS, and if blood sugar is less than 120, to eat 10 grams of carbs.  She was told to call me with blood sugar reading tomorrow.

## 2016-05-16 MED FILL — EZETIMIBE 10 MG TABLET: 10 | 30 days supply | Qty: 30 | Fill #1

## 2016-05-19 ENCOUNTER — Telehealth: Payer: 59 | Admitting: Physician Assistant

## 2016-05-19 ENCOUNTER — Other Ambulatory Visit: Payer: Self-pay

## 2016-05-19 DIAGNOSIS — R399 Unspecified symptoms and signs involving the genitourinary system: Secondary | ICD-10-CM | POA: Diagnosis not present

## 2016-05-19 MED ORDER — GLUCOSE BLOOD VI STRP: ORAL_STRIP | 5 refills | Status: DC

## 2016-05-19 MED ORDER — NITROFURANTOIN MONOHYD MACRO 100 MG PO CAPS: 100.0000 mg | ORAL_CAPSULE | Freq: Two times a day (BID) | ORAL | 0 refills | Status: DC

## 2016-05-19 MED ORDER — FREESTYLE LANCETS MISC: 5 refills | Status: DC

## 2016-05-19 MED ORDER — FREESTYLE LITE DEVI: 0 refills | Status: DC

## 2016-05-19 MED FILL — NITROFURANTOIN MONO-MCR 100: 100 | 5 days supply | Qty: 10 | Fill #0

## 2016-05-19 NOTE — Progress Notes (Signed)

## 2016-05-20 ENCOUNTER — Other Ambulatory Visit: Payer: Self-pay

## 2016-05-25 ENCOUNTER — Ambulatory Visit (INDEPENDENT_AMBULATORY_CARE_PROVIDER_SITE_OTHER): Payer: 59 | Admitting: Orthopedic Surgery

## 2016-05-25 ENCOUNTER — Encounter (INDEPENDENT_AMBULATORY_CARE_PROVIDER_SITE_OTHER): Payer: Self-pay | Admitting: Orthopedic Surgery

## 2016-05-25 DIAGNOSIS — G5601 Carpal tunnel syndrome, right upper limb: Secondary | ICD-10-CM

## 2016-05-25 DIAGNOSIS — M65311 Trigger thumb, right thumb: Secondary | ICD-10-CM | POA: Diagnosis not present

## 2016-05-25 MED ORDER — BUPIVACAINE HCL 0.25 % IJ SOLN
0.3300 mL | INTRAMUSCULAR | Status: AC | PRN
  Administered 2016-05-25: .33 mL

## 2016-05-25 MED ORDER — METHYLPREDNISOLONE ACETATE 40 MG/ML IJ SUSP
13.3300 mg | INTRAMUSCULAR | Status: AC | PRN
  Administered 2016-05-25: 13.33 mg

## 2016-05-25 MED ORDER — LIDOCAINE HCL 1 % IJ SOLN
1.0000 mL | INTRAMUSCULAR | Status: AC | PRN
  Administered 2016-05-25: 1 mL

## 2016-05-25 MED FILL — LYRICA 50 MG CAPSULE: 50 | 90 days supply | Qty: 90 | Fill #0

## 2016-05-25 MED FILL — INDOMETHACIN 50 MG CAPSULE: 50 | 20 days supply | Qty: 60 | Fill #0

## 2016-05-25 MED FILL — JANUVIA 100 MG TABLET: 100 | 90 days supply | Qty: 90 | Fill #0

## 2016-05-25 NOTE — Progress Notes (Signed)
Office Visit Note   Patient: Kelsey Vang           Date of Birth: April 30, 1975           MRN: [id number] Visit Date: 05/25/2016 Requested by: Milus Height, PA-C [address] [place], [place] [address] PCP: REDMON,NOELLE, PA-C  Subjective: No chief complaint on file.   HPI Kelsey Vang is a 41 year old right-hand-dominant patient with 1 month history of significant right thumb and hand pain.  She describes locking at the IP joint.  Radiographs were obtained and reported as normal.  Still swelling of the base of the thumb.  She feels at times like there is a hot nail running in the thumb.  Her index finger started to hurt as well.  She reports decreased strength and grip power.  No history of gout.  Recent labs for arthritis were negative.  She has been taking Indocin without much relief.              Review of Systems All systems reviewed are negative as they relate to the chief complaint within the history of present illness.  Patient denies  fevers or chills.    Assessment & Plan: Visit Diagnoses: No diagnosis found.  Plan: Impression is right thumb and hand pain with what looks like a pretty-significant trigger thumb.  It's a little bit unusual for it to make the whole thumb swelled but this is the likely explanation.  She also reports symptoms of numbness and tingling in the fingertips which is been fairly constant and chronic over the past several months.  Ultrasound-guided injection into the tendon sheath underneath the pulley is performed today for the thumb.  I also like to get nerve conduction study to evaluate for carpal tunnel.  I'll see her back after those studies.  Follow-Up Instructions: No Follow-up on file.   Orders:  No orders of the defined types were placed in this encounter.  No orders of the defined types were placed in this encounter.     Procedures: Hand/UE Inj Date/Time: 05/25/2016 5:49 PM Performed by: Cammy Copa Authorized by: Cammy Copa   Consent Given by:  Patient Site marked: the procedure site was marked   Timeout: prior to procedure the correct patient, procedure, and site was verified   Indications:  Therapeutic Condition: trigger finger   Location:  Thumb Site:  R thumb A1 Prep: patient was prepped and draped in usual sterile fashion   Needle Size:  25 G Approach:  Volar Ultrasound Guidance: Yes   Medications:  1 mL lidocaine 1 %; 0.33 mL bupivacaine 0.25 %; 13.33 mg methylPREDNISolone acetate 40 MG/ML Patient tolerance:  Patient tolerated the procedure well with no immediate complications  No images are attached to the encounter.    Clinical Data: No additional findings.  Objective: Vital Signs: There were no vitals taken for this visit.  Physical Exam   Constitutional: Patient appears well-developed HEENT:  Head: Normocephalic Eyes:EOM are normal Neck: Normal range of motion Cardiovascular: Normal rate Pulmonary/chest: Effort normal Neurologic: Patient is alert Skin: Skin is warm Psychiatric: Patient has normal mood and affect    Ortho Exam examination of the right thumb demonstrates triggering at the IP joint but intact EPL FPL function.  MCP joint is stable.  Negative CMC grind test.  Positive carpal tunnel compression testing.  Interestingly she has a palmaris longus tendon on the right and not on the left.  Wrist range of motion is otherwise full.  Negative Tinel's cubital tunnel in the elbow.  Specialty Comments:  No specialty comments available.  Imaging: No results found.   PMFS History: Patient Active Problem List   Diagnosis Date Noted  . Obstructive apnea 04/23/2015  . Type 2 diabetes mellitus (HCC) 12/27/2013  . Current tobacco use 12/27/2013  . Morbid (severe) obesity due to excess calories (HCC) 12/27/2013  . HLD (hyperlipidemia) 12/27/2013   Past Medical History:  Diagnosis Date  . Diabetes mellitus without complication (HCC)   . Hyperlipemia       Family History  Problem Relation Age of Onset  . Cancer Mother   . Diabetes Father     Past Surgical History:  Procedure Laterality Date  . CHOLECYSTECTOMY    . ENDOMETRIAL ABLATION    . GALLBLADDER SURGERY    . TUBAL LIGATION     Social History   Occupational History  . Not on file.   Social History Main Topics  . Smoking status: Current Every Day Smoker    Packs/day: 1.00    Types: Cigarettes  . Smokeless tobacco: Never Used  . Alcohol use No  . Drug use: No  . Sexual activity: Not on file

## 2016-05-30 ENCOUNTER — Telehealth: Payer: Self-pay | Admitting: Nutrition

## 2016-05-30 NOTE — Telephone Encounter (Signed)
Error: Charted on wrong patient

## 2016-05-30 NOTE — Telephone Encounter (Signed)
Pt. Faxed blood sugar readings.   FBSs: 167-200, acL: 178-231, acS:149-196, HS: 175-247.  Taking 4 clicks before each meal  Increase clicks to 5 ac meals. Call if blood sugars drop low

## 2016-06-01 ENCOUNTER — Other Ambulatory Visit: Payer: Self-pay

## 2016-06-01 MED ORDER — ACCU-CHEK AVIVA DEVI: 0 refills | Status: AC

## 2016-06-01 MED ORDER — ACCU-CHEK FASTCLIX LANCETS MISC: 5 refills | Status: DC

## 2016-06-01 MED ORDER — GLUCOSE BLOOD VI STRP
ORAL_STRIP | 5 refills | Status: DC
Start: 2016-06-01 — End: 2017-04-17

## 2016-06-01 MED FILL — FREESTYLE LITE METER: 30 days supply | Qty: 1 | Fill #0

## 2016-06-01 MED FILL — ATORVASTATIN 80 MG TABLET: 80 | 30 days supply | Qty: 30 | Fill #0

## 2016-06-01 MED FILL — FREESTYLE LITE TEST STRIP: 66 days supply | Qty: 200 | Fill #0

## 2016-06-01 MED FILL — FREESTYLE LANCETS: 66 days supply | Qty: 200 | Fill #0

## 2016-06-08 ENCOUNTER — Encounter (INDEPENDENT_AMBULATORY_CARE_PROVIDER_SITE_OTHER): Payer: Self-pay | Admitting: Physical Medicine and Rehabilitation

## 2016-06-08 ENCOUNTER — Ambulatory Visit (INDEPENDENT_AMBULATORY_CARE_PROVIDER_SITE_OTHER): Payer: 59 | Admitting: Physical Medicine and Rehabilitation

## 2016-06-08 DIAGNOSIS — R202 Paresthesia of skin: Secondary | ICD-10-CM

## 2016-06-08 NOTE — Progress Notes (Signed)
Kelsey Vang - 41 y.o. female MRN [id number]  Date of birth: 04/11/1976  Office Visit Note: Visit Date: 06/08/2016 PCP: REDMON,NOELLE, PA-C Referred by: Kelsey Height, PA-C  Subjective: Chief Complaint  Patient presents with  . Right Hand - Numbness   HPI: [name] is a 41 year old right-hand-dominant female who comes in today at the request of Dr. August Saucer for electrodiagnostic evaluation of years of worsening pain and numbness in the left more than right hand. She has had a trigger thumb problem for a few weeks on the right. She did receive an injection for this helped some. She does wear braces on both hands. She does wake up in the middle of night with hand numbness. The numbness and tingling is the radial 3 digits bilaterally. The left hand and seemingly worse than the right. Letter diagnostic studies on her legs before. She has not been previously diagnosed with electrodiagnostic studies of the hands. She is an insulin-dependent diabetic. She is also a chronic smoker.    Right hand dominant Numbness in right hand for years- sometimes goes up to elbow. Had trigger finger a few weeks ago right thumb. Sleeps with braces on hands- wakes up in middle of night with hand numb.  ROS Otherwise per HPI.  Assessment & Plan: Visit Diagnoses:  1. Paresthesia of skin     Plan: No additional findings.  Impression: The above electrodiagnostic study is ABNORMAL and reveals evidence of a moderate Bilateral median nerve entrapment at the wrist (carpal tunnel syndrome) affecting sensory and motor components.   There is no significant electrodiagnostic evidence of any other focal nerve entrapment, brachial plexopathy or generalized peripheral neuropathy.   Recommendations: 1.  Follow-up with referring physician. 2.  Continue current management of symptoms. 3.  Continue use of resting splint at night-time and as needed during the day. 4.  Suggest surgical evaluation.   Meds & Orders: No orders  of the defined types were placed in this encounter.   Orders Placed This Encounter  Procedures  . NCV with EMG (electromyography)    Follow-up: Return for scheduled follow up with Dr. August Saucer.   Procedures: No procedures performed  EMG & NCV Findings: Evaluation of the left median motor and the right median motor nerves showed prolonged distal onset latency (L5.4, R6.8 ms) and decreased conduction velocity (Elbow-Wrist, L43, R47 m/s).  The left median (across palm) sensory nerve showed prolonged distal peak latency (Wrist, 5.9 ms), reduced amplitude (5.6 V), and prolonged distal peak latency (Palm, 7.0 ms).  The right median (across palm) sensory nerve showed no response (Wrist) and prolonged distal peak latency (Palm, 4.2 ms).  All remaining nerves (as indicated in the following tables) were within normal limits.  Left vs. Right side comparison data for the median motor nerve indicates abnormal L-R latency difference (1.4 ms).    All examined muscles (as indicated in the following table) showed no evidence of electrical instability.    Impression: The above electrodiagnostic study is ABNORMAL and reveals evidence of a moderate Bilateral median nerve entrapment at the wrist (carpal tunnel syndrome) affecting sensory and motor components.   There is no significant electrodiagnostic evidence of any other focal nerve entrapment, brachial plexopathy or generalized peripheral neuropathy.   Recommendations: 1.  Follow-up with referring physician. 2.  Continue current management of symptoms. 3.  Continue use of resting splint at night-time and as needed during the day. 4.  Suggest surgical evaluation.   Nerve Conduction Studies Anti Sensory Summary Table  Stim Site NR Peak (ms) Norm Peak (ms) P-T Amp (V) Norm P-T Amp Site1 Site2 Delta-P (ms) Dist (cm) Vel (m/s) Norm Vel (m/s)  Left Median Acr Palm Anti Sensory (2nd Digit)  33.7C  Wrist    *5.9 <3.6 *5.6 >10 Wrist Palm 1.1 0.0    Palm     *7.0 <2.0 5.1         Right Median Acr Palm Anti Sensory (2nd Digit)  33C  Wrist *NR  <3.6  >10 Wrist Palm  0.0    Palm    *4.2 <2.0 14.0         Right Radial Anti Sensory (Base 1st Digit)  32.5C  Wrist    1.8 <3.1 27.4  Wrist Base 1st Digit 1.8 0.0    Right Ulnar Anti Sensory (5th Digit)  32.8C  Wrist    3.0 <3.7 26.2 >15.0 Wrist 5th Digit 3.0 14.0 47 >38   Motor Summary Table   Stim Site NR Onset (ms) Norm Onset (ms) O-P Amp (mV) Norm O-P Amp Site1 Site2 Delta-0 (ms) Dist (cm) Vel (m/s) Norm Vel (m/s)  Left Median Motor (Abd Poll Brev)  33.8C  Wrist    *5.4 <4.2 7.9 >5 Elbow Wrist 4.2 18.2 *43 >50  Elbow    9.6  4.1         Right Median Motor (Abd Poll Brev)  31.9C  Wrist    *6.8 <4.2 7.0 >5 Elbow Wrist 4.3 20.0 *47 >50  Elbow    11.1  6.5         Right Ulnar Motor (Abd Dig Min)  31.8C  Wrist    2.5 <4.2 12.8 >3 B Elbow Wrist 3.5 19.0 54 >53  B Elbow    6.0  12.0  A Elbow B Elbow 1.3 11.0 85 >53  A Elbow    7.3  12.2          EMG   Side Muscle Nerve Root Ins Act Fibs Psw Amp Dur Poly Recrt Int Dennie Bible Comment  Right Abd Poll Brev Median C8-T1 Nml Nml Nml Nml Nml 0 Nml Nml   Right 1stDorInt Ulnar C8-T1 Nml Nml Nml Nml Nml 0 Nml Nml     Nerve Conduction Studies Anti Sensory Left/Right Comparison   Stim Site L Lat (ms) R Lat (ms) L-R Lat (ms) L Amp (V) R Amp (V) L-R Amp (%) Site1 Site2 L Vel (m/s) R Vel (m/s) L-R Vel (m/s)  Median Acr Palm Anti Sensory (2nd Digit)  33.7C  Wrist *5.9   *5.6   Wrist Palm     Palm *7.0 *4.2 2.8 5.1 14.0 63.6       Radial Anti Sensory (Base 1st Digit)  32.5C  Wrist  1.8   27.4  Wrist Base 1st Digit     Ulnar Anti Sensory (5th Digit)  32.8C  Wrist  3.0   26.2  Wrist 5th Digit  47    Motor Left/Right Comparison   Stim Site L Lat (ms) R Lat (ms) L-R Lat (ms) L Amp (mV) R Amp (mV) L-R Amp (%) Site1 Site2 L Vel (m/s) R Vel (m/s) L-R Vel (m/s)  Median Motor (Abd Poll Brev)  33.8C  Wrist *5.4 *6.8 *1.4 7.9 7.0 11.4 Elbow Wrist *43 *47 4    Elbow 9.6 11.1 1.5 4.1 6.5 36.9       Ulnar Motor (Abd Dig Min)  31.8C  Wrist  2.5   12.8  B Elbow Wrist  54   B  Elbow  6.0   12.0  A Elbow B Elbow  85   A Elbow  7.3   12.2              Clinical History: No specialty comments available.  She reports that she has been smoking Cigarettes.  She has been smoking about 1.00 pack per day. She has never used smokeless tobacco.   Recent Labs  04/30/16 1416  LABURIC 5.2    Objective:  VS:  HT:    WT:   BMI:     BP:   HR: bpm  TEMP: ( )  RESP:  Physical Exam  Musculoskeletal:  Inspection reveals no atrophy of the bilateral APB or FDI or hand intrinsics. There is no swelling, color changes, allodynia or dystrophic changes. There is 5 out of 5 strength in the bilateral wrist extension, finger abduction and long finger flexion. There is intact sensation to light touch in all dermatomal and peripheral nerve distributions. There is a positive Phalen's test bilaterally. There is a negative Hoffmann's test bilaterally.    Ortho Exam Imaging: No results found.  Past Medical/Family/Surgical/Social History: Medications & Allergies reviewed per EMR Patient Active Problem List   Diagnosis Date Noted  . Obstructive apnea 04/23/2015  . Type 2 diabetes mellitus (HCC) 12/27/2013  . Current tobacco use 12/27/2013  . Morbid (severe) obesity due to excess calories (HCC) 12/27/2013  . HLD (hyperlipidemia) 12/27/2013   Past Medical History:  Diagnosis Date  . Diabetes mellitus without complication (HCC)   . Hyperlipemia    Family History  Problem Relation Age of Onset  . Cancer Mother   . Diabetes Father    Past Surgical History:  Procedure Laterality Date  . CHOLECYSTECTOMY    . ENDOMETRIAL ABLATION    . GALLBLADDER SURGERY    . TUBAL LIGATION     Social History   Occupational History  . Not on file.   Social History Main Topics  . Smoking status: Current Every Day Smoker    Packs/day: 1.00    Types: Cigarettes  .  Smokeless tobacco: Never Used  . Alcohol use No  . Drug use: No  . Sexual activity: Not on file

## 2016-06-09 MED FILL — EZETIMIBE 10 MG TABLET: 10 | 30 days supply | Qty: 30 | Fill #2

## 2016-06-09 MED FILL — AMITRIPTYLINE HCL 50 MG TAB: 50 | 90 days supply | Qty: 90 | Fill #1

## 2016-06-09 MED FILL — VGO 30 DISPOSABLE DEVICE: 30 days supply | Qty: 30 | Fill #1

## 2016-06-09 MED FILL — HumaLOG 100 UNIT/ML SOLN: 100 | 43 days supply | Qty: 30 | Fill #1

## 2016-06-09 NOTE — Procedures (Signed)
EMG & NCV Findings: Evaluation of the left median motor and the right median motor nerves showed prolonged distal onset latency (L5.4, R6.8 ms) and decreased conduction velocity (Elbow-Wrist, L43, R47 m/s).  The left median (across palm) sensory nerve showed prolonged distal peak latency (Wrist, 5.9 ms), reduced amplitude (5.6 V), and prolonged distal peak latency (Palm, 7.0 ms).  The right median (across palm) sensory nerve showed no response (Wrist) and prolonged distal peak latency (Palm, 4.2 ms).  All remaining nerves (as indicated in the following tables) were within normal limits.  Left vs. Right side comparison data for the median motor nerve indicates abnormal L-R latency difference (1.4 ms).    All examined muscles (as indicated in the following table) showed no evidence of electrical instability.    Impression: The above electrodiagnostic study is ABNORMAL and reveals evidence of a moderate Bilateral median nerve entrapment at the wrist (carpal tunnel syndrome) affecting sensory and motor components.   There is no significant electrodiagnostic evidence of any other focal nerve entrapment, brachial plexopathy or generalized peripheral neuropathy.   Recommendations: 1.  Follow-up with referring physician. 2.  Continue current management of symptoms. 3.  Continue use of resting splint at night-time and as needed during the day. 4.  Suggest surgical evaluation.   Nerve Conduction Studies Anti Sensory Summary Table   Stim Site NR Peak (ms) Norm Peak (ms) P-T Amp (V) Norm P-T Amp Site1 Site2 Delta-P (ms) Dist (cm) Vel (m/s) Norm Vel (m/s)  Left Median Acr Palm Anti Sensory (2nd Digit)  33.7C  Wrist    *5.9 <3.6 *5.6 >10 Wrist Palm 1.1 0.0    Palm    *7.0 <2.0 5.1         Right Median Acr Palm Anti Sensory (2nd Digit)  33C  Wrist *NR  <3.6  >10 Wrist Palm  0.0    Palm    *4.2 <2.0 14.0         Right Radial Anti Sensory (Base 1st Digit)  32.5C  Wrist    1.8 <3.1 27.4  Wrist Base  1st Digit 1.8 0.0    Right Ulnar Anti Sensory (5th Digit)  32.8C  Wrist    3.0 <3.7 26.2 >15.0 Wrist 5th Digit 3.0 14.0 47 >38   Motor Summary Table   Stim Site NR Onset (ms) Norm Onset (ms) O-P Amp (mV) Norm O-P Amp Site1 Site2 Delta-0 (ms) Dist (cm) Vel (m/s) Norm Vel (m/s)  Left Median Motor (Abd Poll Brev)  33.8C  Wrist    *5.4 <4.2 7.9 >5 Elbow Wrist 4.2 18.2 *43 >50  Elbow    9.6  4.1         Right Median Motor (Abd Poll Brev)  31.9C  Wrist    *6.8 <4.2 7.0 >5 Elbow Wrist 4.3 20.0 *47 >50  Elbow    11.1  6.5         Right Ulnar Motor (Abd Dig Min)  31.8C  Wrist    2.5 <4.2 12.8 >3 B Elbow Wrist 3.5 19.0 54 >53  B Elbow    6.0  12.0  A Elbow B Elbow 1.3 11.0 85 >53  A Elbow    7.3  12.2          EMG   Side Muscle Nerve Root Ins Act Fibs Psw Amp Dur Poly Recrt Int Dennie Bible Comment  Right Abd Poll Brev Median C8-T1 Nml Nml Nml Nml Nml 0 Nml Nml   Right 1stDorInt Ulnar C8-T1 Nml Nml Nml  Nml Nml 0 Nml Nml     Nerve Conduction Studies Anti Sensory Left/Right Comparison   Stim Site L Lat (ms) R Lat (ms) L-R Lat (ms) L Amp (V) R Amp (V) L-R Amp (%) Site1 Site2 L Vel (m/s) R Vel (m/s) L-R Vel (m/s)  Median Acr Palm Anti Sensory (2nd Digit)  33.7C  Wrist *5.9   *5.6   Wrist Palm     Palm *7.0 *4.2 2.8 5.1 14.0 63.6       Radial Anti Sensory (Base 1st Digit)  32.5C  Wrist  1.8   27.4  Wrist Base 1st Digit     Ulnar Anti Sensory (5th Digit)  32.8C  Wrist  3.0   26.2  Wrist 5th Digit  47    Motor Left/Right Comparison   Stim Site L Lat (ms) R Lat (ms) L-R Lat (ms) L Amp (mV) R Amp (mV) L-R Amp (%) Site1 Site2 L Vel (m/s) R Vel (m/s) L-R Vel (m/s)  Median Motor (Abd Poll Brev)  33.8C  Wrist *5.4 *6.8 *1.4 7.9 7.0 11.4 Elbow Wrist *43 *47 4  Elbow 9.6 11.1 1.5 4.1 6.5 36.9       Ulnar Motor (Abd Dig Min)  31.8C  Wrist  2.5   12.8  B Elbow Wrist  54   B Elbow  6.0   12.0  A Elbow B Elbow  85   A Elbow  7.3   12.2

## 2016-06-15 ENCOUNTER — Ambulatory Visit (INDEPENDENT_AMBULATORY_CARE_PROVIDER_SITE_OTHER): Payer: 59 | Admitting: Orthopedic Surgery

## 2016-06-24 ENCOUNTER — Encounter: Payer: Self-pay | Admitting: Internal Medicine

## 2016-06-24 ENCOUNTER — Ambulatory Visit (INDEPENDENT_AMBULATORY_CARE_PROVIDER_SITE_OTHER): Payer: 59 | Admitting: Internal Medicine

## 2016-06-24 VITALS — BP 134/88 | HR 108 | Ht 61.5 in | Wt 283.0 lb

## 2016-06-24 DIAGNOSIS — E1165 Type 2 diabetes mellitus with hyperglycemia: Secondary | ICD-10-CM

## 2016-06-24 DIAGNOSIS — Z794 Long term (current) use of insulin: Secondary | ICD-10-CM

## 2016-06-24 LAB — POCT GLYCOSYLATED HEMOGLOBIN (HGB A1C): HEMOGLOBIN A1C: 8.1

## 2016-06-24 NOTE — Patient Instructions (Addendum)
Please continue: - Metformin ER 1000 mg 2x a day. - Januvia 100 mg before b'fast.  Please add:  - Lantus 10 units at bedtime. If sugars in am still >130 in 3 days >> increase to 12 units.  Please change: - VGo 30: - smaller meal 4 clicks (10 units) - larger meal: 6 clicks (12 units) - snack: 1-2 clicks (2-4 units)  Please return in 3 months with your sugar log.

## 2016-06-24 NOTE — Progress Notes (Signed)
Patient ID: Kelsey Vang, female   DOB: 1975/06/17, 41 y.o.   MRN: [id number]   HPI: Kelsey Vang is a 41 y.o.-year-old female, returning for f/u for DM2, dx in 2010, insulin-dependent since 01/2016, uncontrolled, without long term complications. She is the wife of Kelsey Vang, who is also my pt. Last visit 2 mo ago.  Last hemoglobin A1c was: 02/26/2016: HbA1c 9.2%. Surprisingly, the fructosamine level checked at the same time was 271 (corresponding to the calculated  HbA1c of 6.2%, which, however, does not correlate with the sugars checked at home) 04/07/2015: HbA1c 7.1% No results found for: HGBA1C  Pt was on a regimen of: - Metformin ER 1000 mg 1x a day, with b'fast (had N/D/AP with regular metformin). - Januvia 100 mg in a.m. for 5 mo (but has a h/o gall stones pancreatitis in 1995 and had flares afterwards, despite cholecystectomy) - Lantus 35 units at bedtime - Novolog 5 units 3x a day, before meals Tried Farxiga >> elevated Cr and Invokana >> yeast infection.  At last visit, we changed to: - Metformin ER 1000 mg 2x a day. - Januvia 100 mg before b'fast. - VGo 30: 5 clicks per meal and 1 click per snacks  Pt checks her sugars 2x a day and they are: - am: 150-240 >> 163-215 - 2h after b'fast: n/c  - before lunch: n/c >> 107-174, 197 - 2h after lunch: n/c - before dinner: n/c >> 109-196 - 2h after dinner: n/c >> 127-224 - bedtime: 200-220 >> >> 121-202 - nighttime: n/c No lows. Lowest sugar was 120 (sick) >> 107 ; she has hypoglycemia awareness at 150.  Highest sugar was 500s (steroid inj), 300s OTW >> 272.  She had steroid inj in 02/2016.  Glucometer: True Metric >> will change to AccuCheck Guide  Pt's meals are: - Breakfast: instant oatmeal or yoghurt + banana - Lunch: salad + meat  - Dinner: meat + 1-2 veggies, less bread - Snacks: not eating past 7 pm; trail mix  - no CKD, last BUN/creatinine:  Lab Results  Component Value Date   BUN 10 04/30/2016    BUN 12 01/12/2016   CREATININE 0.70 04/30/2016   CREATININE 0.63 01/12/2016  02/26/2016: Glucose 143, BUN/creatinine 22/0.63, EGFR 107 On Lisinopril 5.  - last set of lipids: 02/26/2016: 160/179/52/71 No results found for: CHOL, HDL, LDLCALC, LDLDIRECT, TRIG, CHOLHDL  She is on Lipitor 80 mg daily, Zetia. - last eye exam was in 2016. No DR. Goes back 07/04/2016. - no numbness and tingling in her feet. On Lyrica for carpal tunnel in hands and pain in feet from a previous injury.  ROS: Constitutional: no weight gain/loss, + less fatigue, no subjective hyperthermia/hypothermia, + nocturia Eyes: no blurry vision, no xerophthalmia ENT: no sore throat, no nodules palpated in throat, no dysphagia/odynophagia, no hoarseness Cardiovascular: no CP/SOB/palpitations/leg swelling Respiratory: + cough/no SOB Gastrointestinal: no N/V/D/C/+ acid reflux Musculoskeletal: no muscle/no joint aches Skin: no rashes Neurological: no tremors/numbness/tingling/dizziness  I reviewed pt's medications, allergies, PMH, social hx, family hx, and changes were documented in the history of present illness. Otherwise, unchanged from my initial visit note.  Past Medical History:  Diagnosis Date  . Diabetes mellitus without complication ([place])   . Hyperlipemia    Past Surgical History:  Procedure Laterality Date  . CHOLECYSTECTOMY    . ENDOMETRIAL ABLATION    . GALLBLADDER SURGERY    . TUBAL LIGATION     Social History   Social History  . Marital status:  Married    Spouse name: Kelsey Vang   . Number of children: 1   Occupational History  . CMA   Social History Main Topics  . Smoking status: Current Every Day Smoker    Packs/day: 1.00    Types: Cigarettes  . Smokeless tobacco: Never Used  . Alcohol use No  . Drug use: No   Current Outpatient Prescriptions on File Prior to Visit  Medication Sig Dispense Refill  . ACCU-CHEK FASTCLIX LANCETS MISC Use to check sugar 2-3 times daily. 300 each 5   . amitriptyline (ELAVIL) 50 MG tablet Take 50 mg by mouth at bedtime.    Marland Kitchen atorvastatin (LIPITOR) 80 MG tablet Take 80 mg by mouth daily.    . Blood Glucose Monitoring Suppl (ACCU-CHEK AVIVA) device Use as instructed to check sugar daily 1 each 0  . cetirizine (ZYRTEC) 10 MG tablet Take 10 mg by mouth daily.    . Cholecalciferol (VITAMIN D) 2000 units CAPS Take 1 capsule by mouth daily.    . cyclobenzaprine (FLEXERIL) 10 MG tablet Take 10 mg by mouth 3 (three) times daily as needed for muscle spasms.    . fluticasone (FLONASE) 50 MCG/ACT nasal spray Place 1 spray into both nostrils 2 (two) times daily.    Marland Kitchen glucose blood (ACCU-CHEK AVIVA) test strip Use as instructed to check sugar 2-3 times daily 300 each 5  . indomethacin (INDOCIN) 50 MG capsule Take 1 capsule (50 mg total) by mouth 3 (three) times daily with meals. 60 capsule 0  . Insulin Disposable Pump (V-GO 30) KIT Use 1 reservoir a day 30 kit 2  . insulin lispro (HUMALOG) 100 UNIT/ML injection Use up to 70 units a day in the VGo 30 mL 2  . lisinopril (PRINIVIL,ZESTRIL) 5 MG tablet TAKE 1 TABLET EVERY DAY    . metFORMIN (GLUCOPHAGE-XR) 500 MG 24 hr tablet Take 2 tablets (1,000 mg total) by mouth 2 (two) times daily with a meal. 360 tablet 3  . montelukast (SINGULAIR) 10 MG tablet Take 10 mg by mouth at bedtime.    . Omega-3 Fatty Acids (FISH OIL) 1000 MG CAPS Take 1 capsule by mouth 2 (two) times daily.    . pantoprazole (PROTONIX) 40 MG tablet Take 40 mg by mouth 2 (two) times daily.     . pregabalin (LYRICA) 50 MG capsule Take 50 mg by mouth daily.     . sitaGLIPtin (JANUVIA) 100 MG tablet Take 1 tablet (100 mg total) by mouth daily. 90 tablet 3  . acetic acid (VOSOL) 2 % otic solution Place 4 drops into both ears 3 (three) times daily. (Patient not taking: Reported on 06/08/2016) 15 mL 0  . insulin glargine (LANTUS) 100 UNIT/ML injection Inject 35 Units into the skin at bedtime.     . nitrofurantoin, macrocrystal-monohydrate, (MACROBID)  100 MG capsule Take 1 capsule (100 mg total) by mouth 2 (two) times daily. (Patient not taking: Reported on 06/08/2016) 10 capsule 0  . pentosan polysulfate (ELMIRON) 100 MG capsule Take 100 mg by mouth 3 (three) times daily.     No current facility-administered medications on file prior to visit.    Allergies  Allergen Reactions  . Gabapentin Other (See Comments)  . Levemir [Insulin Detemir] Dermatitis    Whelps at injection site  . Keflex [Cephalexin] Rash   Family History  Problem Relation Age of Onset  . Cancer Mother   . Diabetes Father    PE: BP 134/88 (BP Location: Left Arm, Patient Position:  Sitting)   Pulse (!) 108   Ht 5' 1.5" (1.562 m)   Wt 283 lb (128.4 kg)   SpO2 97%   BMI 52.61 kg/m  Wt Readings from Last 3 Encounters:  06/24/16 283 lb (128.4 kg)  05/07/16 278 lb (126.1 kg)  04/30/16 275 lb 9.6 oz (125 kg)   Constitutional: obese, in NAD Eyes: PERRLA, EOMI, no exophthalmos ENT: moist mucous membranes, no thyromegaly, no cervical lymphadenopathy Cardiovascular: tachycardia, RR, No MRG Respiratory: CTA B Gastrointestinal: abdomen soft, NT, ND, BS+ Musculoskeletal: no deformities, strength intact in all 4 Skin: moist, warm, no rashes Neurological: no tremor with outstretched hands, DTR normal in all 4  ASSESSMENT: 1. DM2, insulin-dependent, uncontrolled, without long term complications, but with hypoglycemia - We discussed about GLP-1 receptor agonist, however, we cannot use these due to her history of pancreatitis.   PLAN:  1. Patient with long-standing, uncontrolled diabetes, on oral antidiabetic regimen + now on VGo30 patch pump >> sugars are much better, especially in last  1-2 weeks! HbA1c is also better: 8.1%! - as sugars are still above target >> I suggested to change to VGo 40, but she just got a new shipment of VGo 30 >> will let me know when she is close to running out >> will send a new Rx for VGo 40. Until then >> will add Lantus 10 units at  bedtime. - I suggested to:  Patient Instructions  Please continue: - Metformin ER 1000 mg 2x a day. - Januvia 100 mg before b'fast.  Please add:  - Lantus 10 units at bedtime. If sugars in am still >130 in 3 days >> increase to 12 units.  Please change: - VGo 30: - smaller meal 4 clicks (10 units) - larger meal: 6 clicks (12 units) - snack: 1-2 clicks (2-4 units)  Please return in 3 months with your sugar log.   - Continue checking sugars at different times of the day - check 2-3 times a day, rotating checks >> she did a great job with this >> brings a very good log - advised for yearly eye exams >> has one scheduled - Return to clinic in 3 mo with sugar log   Philemon Kingdom, MD PhD [facility] Endocrinology

## 2016-06-25 ENCOUNTER — Ambulatory Visit (INDEPENDENT_AMBULATORY_CARE_PROVIDER_SITE_OTHER): Payer: 59 | Admitting: Family Medicine

## 2016-06-25 VITALS — BP 128/80 | HR 104 | Temp 97.7°F | Resp 17 | Ht 62.5 in | Wt 284.0 lb

## 2016-06-25 DIAGNOSIS — K2100 Gastro-esophageal reflux disease with esophagitis, without bleeding: Secondary | ICD-10-CM

## 2016-06-25 DIAGNOSIS — E1142 Type 2 diabetes mellitus with diabetic polyneuropathy: Secondary | ICD-10-CM

## 2016-06-25 DIAGNOSIS — E782 Mixed hyperlipidemia: Secondary | ICD-10-CM | POA: Diagnosis not present

## 2016-06-25 DIAGNOSIS — Z5181 Encounter for therapeutic drug level monitoring: Secondary | ICD-10-CM

## 2016-06-25 DIAGNOSIS — Z794 Long term (current) use of insulin: Secondary | ICD-10-CM | POA: Diagnosis not present

## 2016-06-25 MED ORDER — ATORVASTATIN CALCIUM 80 MG PO TABS: 80.0000 mg | ORAL_TABLET | Freq: Every day | ORAL | 3 refills | Status: DC

## 2016-06-25 MED ORDER — EZETIMIBE 10 MG PO TABS: 10.0000 mg | ORAL_TABLET | Freq: Every day | ORAL | 3 refills | Status: DC

## 2016-06-25 NOTE — Patient Instructions (Addendum)
   IF you received an x-ray today, you will receive an invoice from [facility]. Please contact [facility] at [phone] with questions or concerns regarding your invoice.   IF you received labwork today, you will receive an invoice from [facility]. Please contact [facility] at [phone] with questions or concerns regarding your invoice.   Our billing staff will not be able to assist you with questions regarding bills from these companies.  You will be contacted with the lab results as soon as they are available. The fastest way to get your results is to activate your My Chart account. Instructions are located on the last page of this paperwork. If you have not heard from us regarding the results in 2 weeks, please contact this office.     Diabetes Mellitus and Food It is important for you to manage your blood sugar (glucose) level. Your blood glucose level can be greatly affected by what you eat. Eating healthier foods in the appropriate amounts throughout the day at about the same time each day will help you control your blood glucose level. It can also help slow or prevent worsening of your diabetes mellitus. Healthy eating may even help you improve the level of your blood pressure and reach or maintain a healthy weight. General recommendations for healthful eating and cooking habits include:  Eating meals and snacks regularly. Avoid going long periods of time without eating to lose weight.  Eating a diet that consists mainly of plant-based foods, such as fruits, vegetables, nuts, legumes, and whole grains.  Using low-heat cooking methods, such as baking, instead of high-heat cooking methods, such as deep frying.  Work with your dietitian to make sure you understand how to use the Nutrition Facts information on food labels. How can food affect me? Carbohydrates Carbohydrates affect your blood glucose level more than any other type of food. Your dietitian will help  you determine how many carbohydrates to eat at each meal and teach you how to count carbohydrates. Counting carbohydrates is important to keep your blood glucose at a healthy level, especially if you are using insulin or taking certain medicines for diabetes mellitus. Alcohol Alcohol can cause sudden decreases in blood glucose (hypoglycemia), especially if you use insulin or take certain medicines for diabetes mellitus. Hypoglycemia can be a life-threatening condition. Symptoms of hypoglycemia (sleepiness, dizziness, and disorientation) are similar to symptoms of having too much alcohol. If your health care provider has given you approval to drink alcohol, do so in moderation and use the following guidelines:  Women should not have more than one drink per day, and men should not have more than two drinks per day. One drink is equal to: ? 12 oz of beer. ? 5 oz of wine. ? 1 oz of hard liquor.  Do not drink on an empty stomach.  Keep yourself hydrated. Have water, diet soda, or unsweetened iced tea.  Regular soda, juice, and other mixers might contain a lot of carbohydrates and should be counted.  What foods are not recommended? As you make food choices, it is important to remember that all foods are not the same. Some foods have fewer nutrients per serving than other foods, even though they might have the same number of calories or carbohydrates. It is difficult to get your body what it needs when you eat foods with fewer nutrients. Examples of foods that you should avoid that are high in calories and carbohydrates but low in nutrients include:  Trans fats (most processed   foods list trans fats on the Nutrition Facts label).  Regular soda.  Juice.  Candy.  Sweets, such as cake, pie, doughnuts, and cookies.  Fried foods.  What foods can I eat? Eat nutrient-rich foods, which will nourish your body and keep you healthy. The food you should eat also will depend on several factors,  including:  The calories you need.  The medicines you take.  Your weight.  Your blood glucose level.  Your blood pressure level.  Your cholesterol level.  You should eat a variety of foods, including:  Protein. ? Lean cuts of meat. ? Proteins low in saturated fats, such as fish, egg whites, and beans. Avoid processed meats.  Fruits and vegetables. ? Fruits and vegetables that may help control blood glucose levels, such as apples, mangoes, and yams.  Dairy products. ? Choose fat-free or low-fat dairy products, such as milk, yogurt, and cheese.  Grains, bread, pasta, and rice. ? Choose whole grain products, such as multigrain bread, whole oats, and brown rice. These foods may help control blood pressure.  Fats. ? Foods containing healthful fats, such as nuts, avocado, olive oil, canola oil, and fish.  Does everyone with diabetes mellitus have the same meal plan? Because every person with diabetes mellitus is different, there is not one meal plan that works for everyone. It is very important that you meet with a dietitian who will help you create a meal plan that is just right for you. This information is not intended to replace advice given to you by your health care provider. Make sure you discuss any questions you have with your health care provider. Document Released: 12/30/2004 Document Revised: 09/10/2015 Document Reviewed: 03/01/2013 Elsevier Interactive Patient Education  2017 Elsevier Inc.  

## 2016-06-25 NOTE — Progress Notes (Signed)
Chief Complaint  Patient presents with  . Establish Care    HPI   Patient would like to establish care and have a new PCP at our office.   Diabetes Mellitus: Patient presents for follow up of diabetes. Symptoms: paresthesia of the feet. Symptoms have stabilized. Patient denies hyperglycemia, hypoglycemia , increase appetite, nausea, polydipsia, polyuria and visual disturbances.  Evaluation to date has been included: hemoglobin A1C.  Home sugars: BGs consistently in an acceptable range.  Lab Results  Component Value Date   HGBA1C 8.1 06/24/2016   She sees Encrinology and she has continuous insulin delivery Her last a1c improved from 9.2 to 8.1   Morbid Obesity- pt reports that she has not been exercising due to weather She reports that she is doing portion control and is carb counting She reports that she is trying to consume 15 to 18 grams of protein with your meal.  Wt Readings from Last 3 Encounters:  06/25/16 284 lb (128.8 kg)  06/24/16 283 lb (128.4 kg)  05/07/16 278 lb (126.1 kg)   Body mass index is 51.12 kg/m.   GERD She reports that she was put on Protonix by GI after being on omeprazole She wakes up with symptoms about three times a week She reports that she does not know   Neuropathy She is on amitriptilline and lyrica She reports that it is helping with the numbness and tingling.  She reports that since her sugars have gotten better that has helped as well.   Mixed hyperlipidemia She needs refills of lipitor and zetia She reports that she gets leg cramps at night She reports that her lipids were checked by her previous PCP She reports that her last lipid was checked November 2017.    Past Medical History:  Diagnosis Date  . Diabetes mellitus without complication ([place])   . Hyperlipemia     Current Outpatient Prescriptions  Medication Sig Dispense Refill  . ACCU-CHEK FASTCLIX LANCETS MISC Use to check sugar 2-3 times daily. 300 each 5  .  amitriptyline (ELAVIL) 50 MG tablet Take 50 mg by mouth at bedtime.    Marland Kitchen atorvastatin (LIPITOR) 80 MG tablet Take 1 tablet (80 mg total) by mouth daily. 90 tablet 3  . Blood Glucose Monitoring Suppl (ACCU-CHEK AVIVA) device Use as instructed to check sugar daily 1 each 0  . cetirizine (ZYRTEC) 10 MG tablet Take 10 mg by mouth daily.    . Cholecalciferol (VITAMIN D) 2000 units CAPS Take 1 capsule by mouth daily.    . cyclobenzaprine (FLEXERIL) 10 MG tablet Take 10 mg by mouth 3 (three) times daily as needed for muscle spasms.    Marland Kitchen ezetimibe (ZETIA) 10 MG tablet Take 1 tablet (10 mg total) by mouth daily. 90 tablet 3  . fluticasone (FLONASE) 50 MCG/ACT nasal spray Place 1 spray into both nostrils 2 (two) times daily.    Marland Kitchen glucose blood (ACCU-CHEK AVIVA) test strip Use as instructed to check sugar 2-3 times daily 300 each 5  . Insulin Disposable Pump (V-GO 30) KIT Use 1 reservoir a day 30 kit 2  . insulin glargine (LANTUS) 100 UNIT/ML injection Inject 12 Units into the skin at bedtime.     . insulin lispro (HUMALOG) 100 UNIT/ML injection Use up to 70 units a day in the VGo 30 mL 2  . lisinopril (PRINIVIL,ZESTRIL) 5 MG tablet TAKE 1 TABLET EVERY DAY    . metFORMIN (GLUCOPHAGE-XR) 500 MG 24 hr tablet Take 2 tablets (1,000 mg total) by  mouth 2 (two) times daily with a meal. 360 tablet 3  . montelukast (SINGULAIR) 10 MG tablet Take 10 mg by mouth at bedtime.    . Omega-3 Fatty Acids (FISH OIL) 1000 MG CAPS Take 1 capsule by mouth 2 (two) times daily.    . pantoprazole (PROTONIX) 40 MG tablet Take 40 mg by mouth 2 (two) times daily.     . pregabalin (LYRICA) 50 MG capsule Take 50 mg by mouth daily.     . sitaGLIPtin (JANUVIA) 100 MG tablet Take 1 tablet (100 mg total) by mouth daily. 90 tablet 3   No current facility-administered medications for this visit.     Allergies:  Allergies  Allergen Reactions  . Gabapentin Other (See Comments)  . Levemir [Insulin Detemir] Dermatitis    Whelps at  injection site  . Keflex [Cephalexin] Rash    Past Surgical History:  Procedure Laterality Date  . CHOLECYSTECTOMY    . ENDOMETRIAL ABLATION    . GALLBLADDER SURGERY    . TUBAL LIGATION      Social History   Social History  . Marital status: Married    Spouse name: N/A  . Number of children: N/A  . Years of education: N/A   Social History Main Topics  . Smoking status: Current Every Day Smoker    Packs/day: 0.50    Years: 20.00    Types: Cigarettes  . Smokeless tobacco: Never Used  . Alcohol use No  . Drug use: No  . Sexual activity: No   Other Topics Concern  . None   Social History Narrative  . None    Review of Systems  Constitutional: Negative for chills and fever.  Eyes: Negative for blurred vision and double vision.  Respiratory: Negative for cough, shortness of breath and wheezing.   Cardiovascular: Negative for chest pain and palpitations.  Gastrointestinal: Positive for heartburn. Negative for abdominal pain, diarrhea, nausea and vomiting.  Genitourinary: Negative for dysuria and urgency.  Musculoskeletal: Negative for back pain, myalgias and neck pain.  Neurological: Positive for tingling. Negative for dizziness, tremors and headaches.   See hpi  Objective: Vitals:   06/25/16 0854  BP: 128/80  Pulse: (!) 104  Resp: 17  Temp: 97.7 F (36.5 C)  TempSrc: Oral  SpO2: 97%  Weight: 284 lb (128.8 kg)  Height: 5' 2.5" (1.588 m)    Physical Exam  Constitutional: She is oriented to person, place, and time. She appears well-developed and well-nourished.  obese  HENT:  Head: Normocephalic and atraumatic.  Eyes: Conjunctivae and EOM are normal.  Cardiovascular: Normal rate, regular rhythm and normal heart sounds.   No murmur heard. Pulmonary/Chest: Effort normal and breath sounds normal. No respiratory distress. She has no wheezes.  Neurological: She is alert and oriented to person, place, and time.  Skin: Skin is warm. Capillary refill takes less  than 2 seconds. No erythema.    Assessment and Plan Kelsey Vang was seen today for establish care.  Diagnoses and all orders for this visit:  Type 2 diabetes mellitus with diabetic polyneuropathy, with long-term current use of insulin ([place])- improved With insulin and oral meds Continue with nutrition -     Comprehensive metabolic panel  GERD with esophagitis  Morbid obesity ([place])- discussed dietary modifications Continue portion control -     TSH -     Lipid panel  Diabetic polyneuropathy associated with type 2 diabetes mellitus ([place])- continue amitriptilline and lyrica  Encounter for medication monitoring -  Comprehensive metabolic panel  Mixed hyperlipidemia- will check levels, refilled meds today, CPM -     Comprehensive metabolic panel -     TSH -     Lipid panel -     atorvastatin (LIPITOR) 80 MG tablet; Take 1 tablet (80 mg total) by mouth daily. -     ezetimibe (ZETIA) 10 MG tablet; Take 1 tablet (10 mg total) by mouth daily.    A total of 40 minutes were spent face-to-face with the patient during this encounter and over half of that time was spent on counseling and coordination of care.  [place]

## 2016-06-26 LAB — COMPREHENSIVE METABOLIC PANEL
A/G RATIO: 1.5 (ref 1.2–2.2)
ALBUMIN: 4.1 g/dL (ref 3.5–5.5)
ALK PHOS: 99 IU/L (ref 39–117)
ALT: 39 IU/L — ABNORMAL HIGH (ref 0–32)
AST: 35 IU/L (ref 0–40)
BUN / CREAT RATIO: 14 (ref 9–23)
BUN: 9 mg/dL (ref 6–24)
Bilirubin Total: 0.3 mg/dL (ref 0.0–1.2)
CO2: 24 mmol/L (ref 18–29)
CREATININE: 0.63 mg/dL (ref 0.57–1.00)
Calcium: 9.9 mg/dL (ref 8.7–10.2)
Chloride: 99 mmol/L (ref 96–106)
GFR calc Af Amer: 130 mL/min/{1.73_m2} (ref >59)
GFR calc non Af Amer: 113 mL/min/{1.73_m2} (ref >59)
GLOBULIN, TOTAL: 2.8 g/dL (ref 1.5–4.5)
Glucose: 172 mg/dL — ABNORMAL HIGH (ref 65–99)
POTASSIUM: 4.6 mmol/L (ref 3.5–5.2)
SODIUM: 139 mmol/L (ref 134–144)
Total Protein: 6.9 g/dL (ref 6.0–8.5)

## 2016-06-26 LAB — LIPID PANEL
Chol/HDL Ratio: 2.9 ratio units (ref 0.0–4.4)
Cholesterol, Total: 156 mg/dL (ref 100–199)
HDL: 53 mg/dL (ref >39)
LDL Calculated: 61 mg/dL (ref 0–99)
Triglycerides: 212 mg/dL — ABNORMAL HIGH (ref 0–149)
VLDL CHOLESTEROL CAL: 42 mg/dL — AB (ref 5–40)

## 2016-06-26 LAB — TSH: TSH: 2.63 u[IU]/mL (ref 0.450–4.500)

## 2016-07-04 ENCOUNTER — Encounter: Payer: Self-pay | Admitting: Internal Medicine

## 2016-07-04 ENCOUNTER — Other Ambulatory Visit: Payer: Self-pay

## 2016-07-04 DIAGNOSIS — H52223 Regular astigmatism, bilateral: Secondary | ICD-10-CM | POA: Diagnosis not present

## 2016-07-04 MED ORDER — V-GO 40 KIT: PACK | 2 refills | Status: DC

## 2016-07-04 MED FILL — VGO 40 DISPOSABLE DEVICE: 30 days supply | Qty: 30 | Fill #0

## 2016-07-04 NOTE — Telephone Encounter (Signed)
Called and LVM advising patient that we had submitted the VGO 40. Left call back number if any questions, will also message patient through mychart.

## 2016-07-07 MED FILL — ATORVASTATIN 80 MG TABLET: 80 | 90 days supply | Qty: 90 | Fill #0

## 2016-07-07 MED FILL — EZETIMIBE 10 MG TABLET: 10 | 90 days supply | Qty: 90 | Fill #0

## 2016-07-09 ENCOUNTER — Emergency Department (HOSPITAL_BASED_OUTPATIENT_CLINIC_OR_DEPARTMENT_OTHER)
Admission: EM | Admit: 2016-07-09 | Discharge: 2016-07-09 | Disposition: A | Discharge status: Home / Self Care | Payer: 59 | Attending: Emergency Medicine | Admitting: Emergency Medicine

## 2016-07-09 ENCOUNTER — Emergency Department (HOSPITAL_BASED_OUTPATIENT_CLINIC_OR_DEPARTMENT_OTHER): Payer: 59

## 2016-07-09 ENCOUNTER — Encounter (HOSPITAL_BASED_OUTPATIENT_CLINIC_OR_DEPARTMENT_OTHER): Payer: Self-pay | Admitting: Emergency Medicine

## 2016-07-09 DIAGNOSIS — R51 Headache: Secondary | ICD-10-CM | POA: Diagnosis not present

## 2016-07-09 DIAGNOSIS — G44209 Tension-type headache, unspecified, not intractable: Secondary | ICD-10-CM | POA: Insufficient documentation

## 2016-07-09 DIAGNOSIS — R079 Chest pain, unspecified: Secondary | ICD-10-CM | POA: Insufficient documentation

## 2016-07-09 DIAGNOSIS — E119 Type 2 diabetes mellitus without complications: Secondary | ICD-10-CM | POA: Insufficient documentation

## 2016-07-09 DIAGNOSIS — R0602 Shortness of breath: Secondary | ICD-10-CM | POA: Diagnosis not present

## 2016-07-09 DIAGNOSIS — Z79899 Other long term (current) drug therapy: Secondary | ICD-10-CM | POA: Diagnosis not present

## 2016-07-09 DIAGNOSIS — Z794 Long term (current) use of insulin: Secondary | ICD-10-CM | POA: Diagnosis not present

## 2016-07-09 DIAGNOSIS — F17210 Nicotine dependence, cigarettes, uncomplicated: Secondary | ICD-10-CM | POA: Insufficient documentation

## 2016-07-09 DIAGNOSIS — M542 Cervicalgia: Secondary | ICD-10-CM | POA: Diagnosis not present

## 2016-07-09 LAB — BASIC METABOLIC PANEL
Anion gap: 8 (ref 5–15)
BUN: 12 mg/dL (ref 6–20)
CALCIUM: 8.9 mg/dL (ref 8.9–10.3)
CHLORIDE: 102 mmol/L (ref 101–111)
CO2: 26 mmol/L (ref 22–32)
CREATININE: 0.77 mg/dL (ref 0.44–1.00)
GFR calc non Af Amer: >60 mL/min (ref >60)
Glucose, Bld: 158 mg/dL — ABNORMAL HIGH (ref 65–99)
Potassium: 3.8 mmol/L (ref 3.5–5.1)
Sodium: 136 mmol/L (ref 135–145)

## 2016-07-09 LAB — CBC
HCT: 39.9 % (ref 36.0–46.0)
Hemoglobin: 13 g/dL (ref 12.0–15.0)
MCH: 30.4 pg (ref 26.0–34.0)
MCHC: 32.6 g/dL (ref 30.0–36.0)
MCV: 93.4 fL (ref 78.0–100.0)
PLATELETS: 300 10*3/uL (ref 150–400)
RBC: 4.27 MIL/uL (ref 3.87–5.11)
RDW: 14.4 % (ref 11.5–15.5)
WBC: 11.1 10*3/uL — ABNORMAL HIGH (ref 4.0–10.5)

## 2016-07-09 LAB — TROPONIN I

## 2016-07-09 MED ORDER — METOCLOPRAMIDE HCL 5 MG/ML IJ SOLN
10.0000 mg | Freq: Once | INTRAMUSCULAR | Status: AC
  Administered 2016-07-09: 10 mg via INTRAVENOUS
  Filled 2016-07-09: qty 2

## 2016-07-09 MED ORDER — KETOROLAC TROMETHAMINE 30 MG/ML IJ SOLN
30.0000 mg | Freq: Once | INTRAMUSCULAR | Status: AC
  Administered 2016-07-09: 30 mg via INTRAVENOUS
  Filled 2016-07-09: qty 1

## 2016-07-09 MED ORDER — DIPHENHYDRAMINE HCL 50 MG/ML IJ SOLN
25.0000 mg | Freq: Once | INTRAMUSCULAR | Status: AC
  Administered 2016-07-09: 25 mg via INTRAVENOUS
  Filled 2016-07-09: qty 1

## 2016-07-09 MED ORDER — DEXAMETHASONE SODIUM PHOSPHATE 10 MG/ML IJ SOLN
10.0000 mg | Freq: Once | INTRAMUSCULAR | Status: AC
  Administered 2016-07-09: 10 mg via INTRAVENOUS
  Filled 2016-07-09: qty 1

## 2016-07-09 MED ORDER — SODIUM CHLORIDE 0.9 % IV BOLUS (SEPSIS)
1000.0000 mL | Freq: Once | INTRAVENOUS | Status: AC
  Administered 2016-07-09: 1000 mL via INTRAVENOUS

## 2016-07-09 NOTE — ED Notes (Signed)
Up to b/r, steady gait 

## 2016-07-09 NOTE — Discharge Instructions (Signed)
Continue your medications as before.  Return to the emergency department if your symptoms significant only worsen or change.

## 2016-07-09 NOTE — ED Notes (Signed)
EDP into room, prior to RN assessment, see MD notes, pending orders.   

## 2016-07-09 NOTE — ED Notes (Signed)
To CT

## 2016-07-09 NOTE — ED Provider Notes (Signed)
[facility] DEPT [facility] Provider Note   CSN: [id number] Arrival date & time: 07/09/16  2130  By signing my name below, I, Kelsey Vang, attest that this documentation has been prepared under the direction and in the presence of physician practitioner, Veryl Speak, MD. Electronically Signed: [name], Scribe. 07/09/2016. 9:58 PM.  History   Chief Complaint Chief Complaint  Patient presents with  . Shortness of Breath    The history is provided by the patient. No language interpreter was used.     HPI Comments: Kelsey Vang is a 41 y.o. female with PMHx including DM and HLD who presents to the Emergency Department complaining of a persistent right-sided headache beginning 5 days ago. She states the pain radiates into the right side of her neck and right shoulder and is most significant directly behind her right eye. No recent trauma to her head. Pt reports some shortness of breath beginning a few hours ago that has not improved. She uses amitriptyline for chronic migraines daily and notes it has not improved her current headache; she notes her current headache is different from her typical migraines. Pt denies numbness/tingling, focal weakness, chest pain, or any other associated symptoms.  Past Medical History:  Diagnosis Date  . Diabetes mellitus without complication ([place])   . Hyperlipemia     Patient Active Problem List   Diagnosis Date Noted  . Obstructive apnea 04/23/2015  . Type 2 diabetes mellitus with hyperglycemia, with long-term current use of insulin ([place]) 12/27/2013  . Current tobacco use 12/27/2013  . Morbid (severe) obesity due to excess calories ([place]) 12/27/2013  . HLD (hyperlipidemia) 12/27/2013    Past Surgical History:  Procedure Laterality Date  . CHOLECYSTECTOMY    . ENDOMETRIAL ABLATION    . GALLBLADDER SURGERY    . TUBAL LIGATION      OB History    No data available       Home Medications    Prior to Admission medications     Medication Sig Start Date End Date Taking? Authorizing Provider  ACCU-CHEK FASTCLIX LANCETS MISC Use to check sugar 2-3 times daily. 06/01/16  Yes Philemon Kingdom, MD  amitriptyline (ELAVIL) 50 MG tablet Take 50 mg by mouth at bedtime.   Yes Historical Provider, MD  atorvastatin (LIPITOR) 80 MG tablet Take 1 tablet (80 mg total) by mouth daily. 06/25/16  Yes Forrest Moron, MD  Blood Glucose Monitoring Suppl (ACCU-CHEK AVIVA) device Use as instructed to check sugar daily 06/01/16 06/01/17 Yes Philemon Kingdom, MD  cetirizine (ZYRTEC) 10 MG tablet Take 10 mg by mouth daily.   Yes Historical Provider, MD  Cholecalciferol (VITAMIN D) 2000 units CAPS Take 1 capsule by mouth daily.   Yes Historical Provider, MD  cyclobenzaprine (FLEXERIL) 10 MG tablet Take 10 mg by mouth 3 (three) times daily as needed for muscle spasms.   Yes Historical Provider, MD  ezetimibe (ZETIA) 10 MG tablet Take 1 tablet (10 mg total) by mouth daily. 06/25/16  Yes Zoe A Nolon Rod, MD  fluticasone (FLONASE) 50 MCG/ACT nasal spray Place 1 spray into both nostrils 2 (two) times daily.   Yes Historical Provider, MD  glucose blood (ACCU-CHEK AVIVA) test strip Use as instructed to check sugar 2-3 times daily 06/01/16  Yes Philemon Kingdom, MD  Insulin Disposable Pump (V-GO 40) KIT Use one reservoir daily 07/04/16  Yes Philemon Kingdom, MD  insulin lispro (HUMALOG) 100 UNIT/ML injection Use up to 70 units a day in the VGo 04/22/16  Yes Philemon Kingdom,  MD  lisinopril (PRINIVIL,ZESTRIL) 5 MG tablet TAKE 1 TABLET EVERY DAY 01/29/15  Yes Historical Provider, MD  metFORMIN (GLUCOPHAGE-XR) 500 MG 24 hr tablet Take 2 tablets (1,000 mg total) by mouth 2 (two) times daily with a meal. 04/22/16  Yes Philemon Kingdom, MD  montelukast (SINGULAIR) 10 MG tablet Take 10 mg by mouth at bedtime.   Yes Historical Provider, MD  Omega-3 Fatty Acids (FISH OIL) 1000 MG CAPS Take 1 capsule by mouth 2 (two) times daily.   Yes Historical Provider, MD  pantoprazole  (PROTONIX) 40 MG tablet Take 40 mg by mouth 2 (two) times daily.    Yes Historical Provider, MD  pregabalin (LYRICA) 50 MG capsule Take 50 mg by mouth daily.    Yes Historical Provider, MD  sitaGLIPtin (JANUVIA) 100 MG tablet Take 1 tablet (100 mg total) by mouth daily. 04/22/16  Yes Philemon Kingdom, MD  insulin glargine (LANTUS) 100 UNIT/ML injection Inject 12 Units into the skin at bedtime.     Historical Provider, MD    Family History Family History  Problem Relation Age of Onset  . Cancer Mother   . Diabetes Father     Social History Social History  Substance Use Topics  . Smoking status: Current Every Day Smoker    Packs/day: 0.50    Years: 20.00    Types: Cigarettes  . Smokeless tobacco: Never Used  . Alcohol use No     Allergies   Gabapentin; Levemir [insulin detemir]; and Keflex [cephalexin]   Review of Systems Review of Systems  Respiratory: Positive for shortness of breath.   Cardiovascular: Negative for chest pain.  Musculoskeletal: Positive for myalgias and neck pain.  Neurological: Positive for headaches. Negative for weakness.  All other systems reviewed and are negative.  Physical Exam Updated Vital Signs BP 130/61 (BP Location: Left Arm)   Pulse (!) 104   Temp 98.4 F (36.9 C) (Oral)   Resp 20   Ht '5\' 2"'  (1.575 m)   Wt 284 lb (128.8 kg)   LMP 06/30/2016   SpO2 97%   BMI 51.94 kg/m   Physical Exam  Constitutional: She is oriented to person, place, and time. She appears well-developed and well-nourished. No distress.  HENT:  Head: Normocephalic and atraumatic.  Eyes: EOM are normal. Pupils are equal, round, and reactive to light.  Neck: Normal range of motion.  Cardiovascular: Normal rate, regular rhythm and normal heart sounds.   Pulmonary/Chest: Effort normal and breath sounds normal.  Abdominal: Soft. She exhibits no distension. There is no tenderness.  Musculoskeletal: Normal range of motion.  Neurological: She is alert and oriented to  person, place, and time. No cranial nerve deficit or sensory deficit. Coordination normal.  Skin: Skin is warm and dry.  Psychiatric: She has a normal mood and affect. Judgment normal.  Nursing note and vitals reviewed.  ED Treatments / Results  Labs (all labs ordered are listed, but only abnormal results are displayed) Labs Reviewed  BASIC METABOLIC PANEL  CBC  TROPONIN I    EKG  EKG Interpretation  Date/Time:  Saturday July 09 2016 21:38:30 EDT Ventricular Rate:  101 PR Interval:  144 QRS Duration: 88 QT Interval:  346 QTC Calculation: 448 R Axis:   61 Text Interpretation:  Sinus tachycardia Low voltage QRS Borderline ECG Confirmed by Donnia Poplaski  MD, Wrigley Winborne ([id number]) on 07/09/2016 9:55:07 PM       Radiology Dg Chest 2 View  Result Date: 07/09/2016 CLINICAL DATA:  Shortness of breath. EXAM: CHEST  2 VIEW COMPARISON:  None. FINDINGS: The heart size and mediastinal contours are within normal limits. Both lungs are clear. No pneumothorax or pleural effusion is noted. The visualized skeletal structures are unremarkable. IMPRESSION: No active cardiopulmonary disease. Electronically Signed   By: Marijo Conception, M.D.   On: 07/09/2016 21:55    Procedures Procedures (including critical care time)  DIAGNOSTIC STUDIES: Oxygen Saturation is 97% on RA, normal by my interpretation.    COORDINATION OF CARE: 10:04 PM Discussed treatment plan with pt at bedside and pt agreed to plan.  Medications Ordered in ED Medications - No data to display   Initial Impression / Assessment and Plan / ED Course  I have reviewed the triage vital signs and the nursing notes.  Pertinent labs & imaging results that were available during my care of the patient were reviewed by me and considered in my medical decision making (see chart for details).  Patient presents with complaints of headache, chest pain, difficulty breathing. Her workup reveals a negative head CT, clear chest x-ray, and laboratory  studies are reassuring. She appears in no respiratory distress and I highly doubt any cardiopulmonary etiology. EKG is normal and troponin is negative. She is feeling better after a migraine cocktail and IV fluids. She will be discharged, to return as needed for any problems.  Final Clinical Impressions(s) / ED Diagnoses   Final diagnoses:  None    New Prescriptions New Prescriptions   No medications on file   I personally performed the services described in this documentation, which was scribed in my presence. The recorded information has been reviewed and is accurate.       Veryl Speak, MD 07/09/16 [phone]

## 2016-07-09 NOTE — ED Notes (Signed)
Rash noted to pt hands, bilateral.

## 2016-07-09 NOTE — ED Triage Notes (Signed)
Pt reports right side headache for 1 week.  Today began having SOB and palpitations since approx 1800.

## 2016-07-09 NOTE — ED Notes (Signed)
Back from CT, no changes.  

## 2016-07-09 NOTE — ED Notes (Signed)
Alert, NAD, calm, interactive, resps e/u, speaking in clear complete sentences, no dyspnea noted, skin W&D, VSS, c/o R sided HA different from usual migraines, takes amitriptyline for migraines, associated with R neck, shoulder and eye pain. States, "felt heart thundering earlier at home with HR of 120". Mentions allergies this week, and cold sx 2 weeks ago. EDP into room. Family at [facility].

## 2016-07-09 NOTE — ED Notes (Signed)
Back from b/r, steady gait, "feel better", eye, neck and shoulder no longer hurt, HA, improved, rates 2/10.  Alert, NAD, calm, interactive, resps e/u, speaking in clear complete sentences, no dyspnea noted, skin W&D, VSS, (denies: sob, nausea, dizziness or visual changes), EDP into room. Family at [facility]. Results explained. Pt updated with d/c plan.

## 2016-07-10 ENCOUNTER — Encounter: Payer: Self-pay | Admitting: Family Medicine

## 2016-07-10 DIAGNOSIS — K2100 Gastro-esophageal reflux disease with esophagitis, without bleeding: Secondary | ICD-10-CM

## 2016-07-11 MED ORDER — PANTOPRAZOLE SODIUM 40 MG PO TBEC: 40.0000 mg | DELAYED_RELEASE_TABLET | Freq: Two times a day (BID) | ORAL | 2 refills | Status: DC

## 2016-07-11 MED FILL — FLUTICASONE PROP 50 MCG SPR: 50 | 60 days supply | Qty: 16 | Fill #1

## 2016-07-11 NOTE — Telephone Encounter (Signed)
Let her know that that is a high dose.  I will refer her to GI for endoscopy to see why if there are any other causes.  Please emphasize that she should eat smaller meals than larger meals, avoid caffeine and wear lose fitting clothing.  The referral will be processed in one week.  She should look out for phone call.  She should follow up with GI. Her medication is at the pharmacy.

## 2016-07-12 MED FILL — PANTOPRAZOLE SOD DR 40 MG T: 40 | 30 days supply | Qty: 60 | Fill #0

## 2016-07-13 MED FILL — HumaLOG 100 UNIT/ML SOLN: 100 | 43 days supply | Qty: 30 | Fill #2

## 2016-07-21 ENCOUNTER — Telehealth: Payer: 59 | Admitting: Physician Assistant

## 2016-07-21 DIAGNOSIS — R399 Unspecified symptoms and signs involving the genitourinary system: Secondary | ICD-10-CM

## 2016-07-21 MED ORDER — NITROFURANTOIN MONOHYD MACRO 100 MG PO CAPS: 100.0000 mg | ORAL_CAPSULE | Freq: Two times a day (BID) | ORAL | 0 refills | Status: DC

## 2016-07-21 MED FILL — NITROFURANTOIN MONO-MCR 100: 100 | 5 days supply | Qty: 10 | Fill #0

## 2016-07-21 NOTE — Progress Notes (Signed)

## 2016-08-05 ENCOUNTER — Other Ambulatory Visit: Payer: Self-pay

## 2016-08-05 VITALS — BP 108/78 | HR 93 | Resp 16 | Ht 62.0 in | Wt 289.3 lb

## 2016-08-05 DIAGNOSIS — E119 Type 2 diabetes mellitus without complications: Secondary | ICD-10-CM

## 2016-08-05 DIAGNOSIS — Z794 Long term (current) use of insulin: Principal | ICD-10-CM

## 2016-08-05 MED FILL — VGO 40 DISPOSABLE DEVICE: 30 days supply | Qty: 30 | Fill #1

## 2016-08-05 NOTE — Patient Instructions (Signed)
1. Plan to eat 30-45 GM (2-3) servings of carbohydrate a meal and 15 GM for snacks.  Plan to eat protein with your snacks 2. Plan to check blood sugar 3-4 times a day fasting and 1 -2hrs after a meal.  Goals of 80-130 fasting and 180 or less after eating. 3. Plan to walk 2-3 times a week for 30 minutes.  Goal of 150 minutes a week 4. Plan to look into the [facility] [phone] or Weight Watchers 5. Plan to see Dr. Elvera Lennox on 09/23/16 6. Plan to see [facility] to [facility] on 11/04/16 at [facility]

## 2016-08-05 NOTE — Patient Outreach (Signed)
[facility]) Care Management   08/05/2016  Kelsey Vang [birth year] [id number]  Kelsey Vang is an 41 y.o. female.   Member seen for follow up office visit for Link to Wellness program for self management of Type 2 diabetes  Subjective: Member states that since she started wearing the V-Go her blood sugars have been better.  States taht when she saw Dr.Gherghe last month her hemoglobin A1C has gone down to 8.1%.  States she is trying to eat better but her weight has been going up.  STates she is walking their dogs 15-20 minutes a day.  States she has cut back her smoking to 1/2 pack/day and she is going through the [employer].  States she is only drinking water now.    Objective:   Review of Systems  Musculoskeletal: Positive for joint pain.  All other systems reviewed and are negative.   Physical Exam Today's Vitals   08/05/16 0846 08/05/16 0850  BP: 108/78   Pulse: 93   Resp: 16   SpO2: 97%   Weight: 289 lb 4.8 oz (131.2 kg)   Height: 1.575 m ('5\' 2"' )   PainSc: 0-No pain 0-No pain   Encounter Medications:   Outpatient Encounter Prescriptions as of 08/05/2016  Medication Sig Note  . ACCU-CHEK FASTCLIX LANCETS MISC Use to check sugar 2-3 times daily.   Marland Kitchen amitriptyline (ELAVIL) 50 MG tablet Take 50 mg by mouth at bedtime.   Marland Kitchen atorvastatin (LIPITOR) 80 MG tablet Take 1 tablet (80 mg total) by mouth daily.   . cetirizine (ZYRTEC) 10 MG tablet Take 10 mg by mouth daily.   . Cholecalciferol (VITAMIN D) 2000 units CAPS Take 1 capsule by mouth daily.   . cyclobenzaprine (FLEXERIL) 10 MG tablet Take 10 mg by mouth 3 (three) times daily as needed for muscle spasms.   Marland Kitchen ezetimibe (ZETIA) 10 MG tablet Take 1 tablet (10 mg total) by mouth daily.   . fluticasone (FLONASE) 50 MCG/ACT nasal spray Place 1 spray into both nostrils 2 (two) times daily.   . Insulin Disposable Pump (V-GO 40) KIT Use one reservoir daily   . insulin lispro (HUMALOG) 100 UNIT/ML injection  Use up to 70 units a day in the VGo   . lisinopril (PRINIVIL,ZESTRIL) 5 MG tablet TAKE 1 TABLET EVERY DAY 04/22/2016: Received from: [facility]  . metFORMIN (GLUCOPHAGE-XR) 500 MG 24 hr tablet Take 2 tablets (1,000 mg total) by mouth 2 (two) times daily with a meal.   . montelukast (SINGULAIR) 10 MG tablet Take 10 mg by mouth at bedtime.   . Omega-3 Fatty Acids (FISH OIL) 1000 MG CAPS Take 1 capsule by mouth 2 (two) times daily.   . pantoprazole (PROTONIX) 40 MG tablet Take 1 tablet (40 mg total) by mouth 2 (two) times daily.   . pregabalin (LYRICA) 50 MG capsule Take 50 mg by mouth daily.    . sitaGLIPtin (JANUVIA) 100 MG tablet Take 1 tablet (100 mg total) by mouth daily.   . Blood Glucose Monitoring Suppl (ACCU-CHEK AVIVA) device Use as instructed to check sugar daily (Patient not taking: Reported on 08/05/2016)   . glucose blood (ACCU-CHEK AVIVA) test strip Use as instructed to check sugar 2-3 times daily (Patient not taking: Reported on 08/05/2016)   . insulin glargine (LANTUS) 100 UNIT/ML injection Inject 12 Units into the skin at bedtime.    . nitrofurantoin, macrocrystal-monohydrate, (MACROBID) 100 MG capsule Take 1 capsule (100 mg total) by  mouth 2 (two) times daily. (Patient not taking: Reported on 08/05/2016)    No facility-administered encounter medications on file as of 08/05/2016.     Functional Status:   In your present state of health, do you have any difficulty performing the following activities: 08/05/2016 04/04/2016  Hearing? N N  Vision? N N  Difficulty concentrating or making decisions? N N  Walking or climbing stairs? N N  Dressing or bathing? N N  Doing errands, shopping? N N  Some recent data might be hidden    Fall/Depression Screening:    PHQ 2/9 Scores 08/05/2016 06/25/2016 05/07/2016 04/30/2016 04/04/2016 04/04/2016 02/10/2016  PHQ - 2 Score 0 0 0 0 0 0 0    Assessment:  Member seen for follow up office visit for Link to Wellness program for  self management of Type 2 diabetes. Member is not at diabetes self management goal of hemoglobin A1C of 7% or below with last reading increased to 8.1%   Member now on the V-Go insulin pump system.   Reports CBGs range 100-120 fasting and 112-190 post prandial.   Member to see endocrinologist on 09/23/16.  Member is walking at work and the dogs 15-20 minutes most days .  Member is currently smoking 1/2 pack/day. Member had  annual eye exams and up to date with regular dental check ups.   Plan:  Plan to eat 30-45 GM (2-3) servings of carbohydrate a meal and 15 GM for snacks.  Plan to eat protein with your snacks Plan to check blood sugar 3-4 times a day fasting and 1 -2hrs after a meal.  Goals of 80-130 fasting and 180 or less after eating. Plan to walk 2-3 times a week for 30 minutes.  Goal of 150 minutes a week Plan to look into the [facility] [phone] or Weight Watchers Plan to see Dr. Cruzita Vang on 09/23/16 Plan to see Link to Wellness on 11/04/16 at [place] Problem One     Most Recent Value  Care Plan Problem One  Elevated blood sugars as evidenced by hemoglobin A1C 9.2% related to dx of Type 2 DM  Role Documenting the Problem One  Care Management Coordinator  Care Plan for Problem One  Active  [facility] Long Term Goal (31-90 days)  Member will decrease hemoglobin A1C to 7 or below in the next 90 days  [facility] Long Term Goal Start Date  08/05/16  Interventions for Problem One Long Term Goal  Reviewed CHO counting and portion  control,  Reinforced importance of losing weight, Given handout on  [facility] and Weight Watchers with the [facility] discount, Encouraged to stop smoking and to attend the Quit smart classes, Reviewed blood sugar goals before and after eating, Instructed to keep her appt with endocrinologist on 09/23/16    Peter Garter RN, [facility] Care Management Coordinator-Link to [place] Management [phone]

## 2016-08-08 DIAGNOSIS — G4733 Obstructive sleep apnea (adult) (pediatric): Secondary | ICD-10-CM | POA: Diagnosis not present

## 2016-08-10 ENCOUNTER — Encounter: Payer: Self-pay | Admitting: Family Medicine

## 2016-08-13 ENCOUNTER — Encounter: Payer: Self-pay | Admitting: Family Medicine

## 2016-08-13 ENCOUNTER — Ambulatory Visit (INDEPENDENT_AMBULATORY_CARE_PROVIDER_SITE_OTHER): Payer: 59 | Admitting: Family Medicine

## 2016-08-13 VITALS — BP 117/81 | HR 118 | Temp 98.0°F | Resp 17 | Ht 62.0 in | Wt 290.0 lb

## 2016-08-13 DIAGNOSIS — L249 Irritant contact dermatitis, unspecified cause: Secondary | ICD-10-CM

## 2016-08-13 LAB — POCT SKIN KOH: SKIN KOH, POC: NEGATIVE

## 2016-08-13 MED ORDER — CLOBETASOL PROPIONATE 0.05 % EX LOTN: 1.0000 "application " | TOPICAL_LOTION | Freq: Two times a day (BID) | CUTANEOUS | 0 refills | Status: DC

## 2016-08-13 MED ORDER — HYDROXYZINE HCL 25 MG PO TABS: 25.0000 mg | ORAL_TABLET | Freq: Four times a day (QID) | ORAL | 0 refills | Status: DC | PRN

## 2016-08-13 MED ORDER — METHYLPREDNISOLONE ACETATE 80 MG/ML IJ SUSP
40.0000 mg | Freq: Once | INTRAMUSCULAR | Status: AC
  Administered 2016-08-13: 40 mg via INTRAMUSCULAR

## 2016-08-13 NOTE — Patient Instructions (Addendum)
   IF you received an x-ray today, you will receive an invoice from [facility]. Please contact [facility] at [phone] with questions or concerns regarding your invoice.   IF you received labwork today, you will receive an invoice from [facility]. Please contact [facility] at [phone] with questions or concerns regarding your invoice.   Our billing staff will not be able to assist you with questions regarding bills from these companies.  You will be contacted with the lab results as soon as they are available. The fastest way to get your results is to activate your My Chart account. Instructions are located on the last page of this paperwork. If you have not heard from us regarding the results in 2 weeks, please contact this office.     Contact Dermatitis Dermatitis is redness, soreness, and swelling (inflammation) of the skin. Contact dermatitis is a reaction to certain substances that touch the skin. There are two types of contact dermatitis:  Irritant contact dermatitis. This type is caused by something that irritates your skin, such as dry hands from washing them too much. This type does not require previous exposure to the substance for a reaction to occur. This type is more common.  Allergic contact dermatitis. This type is caused by a substance that you are allergic to, such as a nickel allergy or poison ivy. This type only occurs if you have been exposed to the substance (allergen) before. Upon a repeat exposure, your body reacts to the substance. This type is less common. What are the causes? Many different substances can cause contact dermatitis. Irritant contact dermatitis is most commonly caused by exposure to:  Makeup.  Soaps.  Detergents.  Bleaches.  Acids.  Metal salts, such as nickel. Allergic contact dermatitis is most commonly caused by exposure to:  Poisonous plants.  Chemicals.  Jewelry.  Latex.  Medicines.  Preservatives in  products, such as clothing. What increases the risk? This condition is more likely to develop in:  People who have jobs that expose them to irritants or allergens.  People who have certain medical conditions, such as asthma or eczema. What are the signs or symptoms? Symptoms of this condition may occur anywhere on your body where the irritant has touched you or is touched by you. Symptoms include:  Dryness or flaking.  Redness.  Cracks.  Itching.  Pain or a burning feeling.  Blisters.  Drainage of small amounts of blood or clear fluid from skin cracks. With allergic contact dermatitis, there may also be swelling in areas such as the eyelids, mouth, or genitals. How is this diagnosed? This condition is diagnosed with a medical history and physical exam. A patch skin test may be performed to help determine the cause. If the condition is related to your job, you may need to see an occupational medicine specialist. How is this treated? Treatment for this condition includes figuring out what caused the reaction and protecting your skin from further contact. Treatment may also include:  Steroid creams or ointments. Oral steroid medicines may be needed in more severe cases.  Antibiotics or antibacterial ointments, if a skin infection is present.  Antihistamine lotion or an antihistamine taken by mouth to ease itching.  A bandage (dressing). Follow these instructions at home: Skin Care   Moisturize your skin as needed.  Apply cool compresses to the affected areas.  Try taking a bath with:  Epsom salts. Follow the instructions on the packaging. You can get these at your local pharmacy or   grocery store.  Baking soda. Pour a small amount into the bath as directed by your health care provider.  Colloidal oatmeal. Follow the instructions on the packaging. You can get this at your local pharmacy or grocery store.  Try applying baking soda paste to your skin. Stir water into  baking soda until it reaches a paste-like consistency.  Do not scratch your skin.  Bathe less frequently, such as every other day.  Bathe in lukewarm water. Avoid using hot water. Medicines   Take or apply over-the-counter and prescription medicines only as told by your health care provider.  If you were prescribed an antibiotic medicine, take or apply your antibiotic as told by your health care provider. Do not stop using the antibiotic even if your condition starts to improve. General instructions   Keep all follow-up visits as told by your health care provider. This is important.  Avoid the substance that caused your reaction. If you do not know what caused it, keep a journal to try to track what caused it. Write down:  What you eat.  What cosmetic products you use.  What you drink.  What you wear in the affected area. This includes jewelry.  If you were given a dressing, take care of it as told by your health care provider. This includes when to change and remove it. Contact a health care provider if:  Your condition does not improve with treatment.  Your condition gets worse.  You have signs of infection such as swelling, tenderness, redness, soreness, or warmth in the affected area.  You have a fever.  You have new symptoms. Get help right away if:  You have a severe headache, neck pain, or neck stiffness.  You vomit.  You feel very sleepy.  You notice red streaks coming from the affected area.  Your bone or joint underneath the affected area becomes painful after the skin has healed.  The affected area turns darker.  You have difficulty breathing. This information is not intended to replace advice given to you by your health care provider. Make sure you discuss any questions you have with your health care provider. Document Released: 04/01/2000 Document Revised: 09/10/2015 Document Reviewed: 08/20/2014 Elsevier Interactive Patient Education  2017  [employer]  

## 2016-08-13 NOTE — Progress Notes (Signed)
Subjective:  By signing my name below, I, Moises Blood, attest that this documentation has been prepared under the direction and in the presence of Delman Cheadle, MD. Electronically Signed: Moises Blood, [place]. 08/13/2016 , 12:13 PM .  Patient was seen in Room 13 .   Patient ID: Kelsey Vang, female    DOB: 13-Feb-1976, 41 y.o.   MRN: [id number] Chief Complaint  Patient presents with  . rash on arms   HPI Kelsey Vang is a 41 y.o. female who presents to Primary Care at [facility] complaining of an itchy rash over her upper arms initially noticed about 2 weeks ago. She thought it was due to her working out in her yard. She reports her rash has spread into her neck and over her upper chest. She's tried applying OTC hydrocortisone cream and ringworm cream without much relief. She also tried benadryl but without any relief. She informs her back was itching yesterday, but her husband didn't notice any spots. She notes eyes itching for the past 2 days, but haven't been applying eye drops. She has 2 dogs at home who roam outside.   She's been checking fasting blood sugars in the morning; today checked at 102 this morning.   Past Medical History:  Diagnosis Date  . Diabetes mellitus without complication ([place])   . Hyperlipemia    Prior to Admission medications   Medication Sig Start Date End Date Taking? Authorizing Provider  ACCU-CHEK FASTCLIX LANCETS MISC Use to check sugar 2-3 times daily. 06/01/16  Yes Philemon Kingdom, MD  amitriptyline (ELAVIL) 50 MG tablet Take 50 mg by mouth at bedtime.   Yes Historical Provider, MD  atorvastatin (LIPITOR) 80 MG tablet Take 1 tablet (80 mg total) by mouth daily. 06/25/16  Yes Forrest Moron, MD  Blood Glucose Monitoring Suppl (ACCU-CHEK AVIVA) device Use as instructed to check sugar daily 06/01/16 06/01/17 Yes Philemon Kingdom, MD  cetirizine (ZYRTEC) 10 MG tablet Take 10 mg by mouth daily.   Yes Historical Provider, MD  Cholecalciferol (VITAMIN D) 2000  units CAPS Take 1 capsule by mouth daily.   Yes Historical Provider, MD  cyclobenzaprine (FLEXERIL) 10 MG tablet Take 10 mg by mouth 3 (three) times daily as needed for muscle spasms.   Yes Historical Provider, MD  ezetimibe (ZETIA) 10 MG tablet Take 1 tablet (10 mg total) by mouth daily. 06/25/16  Yes Zoe A Nolon Rod, MD  fluticasone (FLONASE) 50 MCG/ACT nasal spray Place 1 spray into both nostrils 2 (two) times daily.   Yes Historical Provider, MD  glucose blood (ACCU-CHEK AVIVA) test strip Use as instructed to check sugar 2-3 times daily 06/01/16  Yes Philemon Kingdom, MD  Insulin Disposable Pump (V-GO 40) KIT Use one reservoir daily 07/04/16  Yes Philemon Kingdom, MD  insulin glargine (LANTUS) 100 UNIT/ML injection Inject 12 Units into the skin at bedtime.    Yes Historical Provider, MD  insulin lispro (HUMALOG) 100 UNIT/ML injection Use up to 70 units a day in the VGo 04/22/16  Yes Philemon Kingdom, MD  lisinopril (PRINIVIL,ZESTRIL) 5 MG tablet TAKE 1 TABLET EVERY DAY 01/29/15  Yes Historical Provider, MD  metFORMIN (GLUCOPHAGE-XR) 500 MG 24 hr tablet Take 2 tablets (1,000 mg total) by mouth 2 (two) times daily with a meal. 04/22/16  Yes Philemon Kingdom, MD  montelukast (SINGULAIR) 10 MG tablet Take 10 mg by mouth at bedtime.   Yes Historical Provider, MD  Omega-3 Fatty Acids (FISH OIL) 1000 MG CAPS Take 1 capsule by mouth  2 (two) times daily.   Yes Historical Provider, MD  pantoprazole (PROTONIX) 40 MG tablet Take 1 tablet (40 mg total) by mouth 2 (two) times daily. 07/11/16  Yes Zoe A Nolon Rod, MD  pregabalin (LYRICA) 50 MG capsule Take 50 mg by mouth daily.    Yes Historical Provider, MD  sitaGLIPtin (JANUVIA) 100 MG tablet Take 1 tablet (100 mg total) by mouth daily. 04/22/16  Yes Philemon Kingdom, MD  nitrofurantoin, macrocrystal-monohydrate, (MACROBID) 100 MG capsule Take 1 capsule (100 mg total) by mouth 2 (two) times daily. Patient not taking: Reported on 08/05/2016 07/21/16   Brunetta Jeans,  PA-C   Allergies  Allergen Reactions  . Gabapentin Other (See Comments)  . Levemir [Insulin Detemir] Dermatitis    Whelps at injection site  . Keflex [Cephalexin] Rash   Past Surgical History:  Procedure Laterality Date  . CHOLECYSTECTOMY    . ENDOMETRIAL ABLATION    . GALLBLADDER SURGERY    . TUBAL LIGATION     Family History  Problem Relation Age of Onset  . Cancer Mother   . Diabetes Father    Social History   Social History  . Marital status: Married    Spouse name: N/A  . Number of children: N/A  . Years of education: N/A   Social History Main Topics  . Smoking status: Current Every Day Smoker    Packs/day: 0.50    Years: 20.00    Types: Cigarettes  . Smokeless tobacco: Never Used  . Alcohol use No  . Drug use: No  . Sexual activity: No   Other Topics Concern  . None   Social History Narrative  . None   Depression screen [facility] 2/9 08/05/2016 06/25/2016 05/07/2016 04/30/2016 04/04/2016  Decreased Interest 0 0 0 0 0  Down, Depressed, Hopeless 0 0 0 0 0  PHQ - 2 Score 0 0 0 0 0    Review of Systems  Constitutional: Negative for chills, fatigue, fever and unexpected weight change.  Eyes: Positive for itching.  Respiratory: Negative for cough.   Gastrointestinal: Negative for constipation, diarrhea, nausea and vomiting.  Skin: Positive for rash. Negative for wound.  Neurological: Negative for dizziness, weakness and headaches.       Objective:   Physical Exam  Constitutional: She is oriented to person, place, and time. She appears well-developed and well-nourished. No distress.  HENT:  Head: Normocephalic and atraumatic.  Eyes: EOM are normal. Pupils are equal, round, and reactive to light.  Left eye: small nodule on the upper medial lid  Neck: Neck supple.  Cardiovascular: Normal rate.   Pulmonary/Chest: Effort normal. No respiratory distress.  Musculoskeletal: Normal range of motion.  Neurological: She is alert and oriented to person, place, and  time.  Skin: Skin is warm and dry.  Mild erythematous raised macular rash in small patches, arranging from 56m up to 259m spread over the dorsum of her forearms, as well as upper chest  Psychiatric: She has a normal mood and affect. Her behavior is normal.  Nursing note and vitals reviewed.   BP 117/81   Pulse (!) 118   Temp 98 F (36.7 C) (Oral)   Resp 17   Ht '5\' 2"'  (1.575 m)   Wt 290 lb (131.5 kg)   LMP 07/23/2016 (Approximate)   SpO2 97%   BMI 53.04 kg/m       Results for orders placed or performed in visit on 08/13/16  POCT Skin KOH  Result Value Ref Range  Skin KOH, POC Negative Negative   Assessment & Plan:   1. Irritant contact dermatitis, unspecified trigger   Suspect poison ivy or similar rhus dermatitis from yard work since on exposed skin at neck line and forearms partially treated with the topical hydrocortisone. Pt endorsing diffuse pruritis spreading up to here eyes and back unresponsive to benadryl so wants to proceed with systemic treatment. She is on an insulin pump with good control and will monitor her cbgs very closely for the next 3-4 days while on Depo-Medrol.  Orders Placed This Encounter  Procedures  . Care order/instruction:    Scheduling Instructions:     Complete orders, AVS and go.  Marland Kitchen POCT Skin KOH    Meds ordered this encounter  Medications  . methylPREDNISolone acetate (DEPO-MEDROL) injection 40 mg  . Clobetasol Propionate 0.05 % lotion    Sig: Apply 1 application topically 2 (two) times daily.    Dispense:  120 mL    Refill:  0  . hydrOXYzine (ATARAX/VISTARIL) 25 MG tablet    Sig: Take 1-2 tablets (25-50 mg total) by mouth every 6 (six) hours as needed for itching.    Dispense:  60 tablet    Refill:  0    I personally performed the services described in this documentation, which was scribed in my presence. The recorded information has been reviewed and considered, and addended by me as needed.   Delman Cheadle, M.D.  Primary Care at  [facility] [address] [place], [place] [address] [phone] phone [phone] fax  08/13/16 1:13 PM

## 2016-08-17 MED ORDER — LISINOPRIL 5 MG PO TABS: 5.0000 mg | ORAL_TABLET | Freq: Every day | ORAL | 1 refills | Status: DC

## 2016-08-17 MED ORDER — CYCLOBENZAPRINE HCL 10 MG PO TABS: 10.0000 mg | ORAL_TABLET | Freq: Three times a day (TID) | ORAL | 0 refills | Status: DC | PRN

## 2016-08-17 MED ORDER — MONTELUKAST SODIUM 10 MG PO TABS: 10.0000 mg | ORAL_TABLET | Freq: Every day | ORAL | 3 refills | Status: DC

## 2016-08-18 MED FILL — CYCLOBENZAPRINE 10 MG TAB: 10 | 30 days supply | Qty: 90 | Fill #0

## 2016-08-18 MED FILL — MONTELUKAST SOD 10 MG TAB: 10 | 90 days supply | Qty: 90 | Fill #0

## 2016-08-18 MED FILL — LISINOPRIL 5 MG TABLET: 5 | 90 days supply | Qty: 90 | Fill #0

## 2016-08-24 MED FILL — PANTOPRAZOLE SOD DR 40 MG T: 40 | 30 days supply | Qty: 60 | Fill #1

## 2016-08-31 ENCOUNTER — Other Ambulatory Visit: Payer: Self-pay

## 2016-08-31 MED ORDER — INSULIN LISPRO 100 UNIT/ML ~~LOC~~ SOLN: SUBCUTANEOUS | 2 refills | Status: DC

## 2016-08-31 MED FILL — HumaLOG 100 UNIT/ML SOLN: 100 | 42 days supply | Qty: 30 | Fill #0

## 2016-09-01 MED FILL — VGO 40 DISPOSABLE DEVICE: 30 days supply | Qty: 30 | Fill #2

## 2016-09-06 ENCOUNTER — Telehealth: Payer: 59 | Admitting: Family

## 2016-09-06 DIAGNOSIS — N390 Urinary tract infection, site not specified: Secondary | ICD-10-CM | POA: Diagnosis not present

## 2016-09-06 DIAGNOSIS — A499 Bacterial infection, unspecified: Secondary | ICD-10-CM | POA: Diagnosis not present

## 2016-09-06 MED ORDER — CIPROFLOXACIN HCL 500 MG PO TABS: 500.0000 mg | ORAL_TABLET | Freq: Two times a day (BID) | ORAL | 0 refills | Status: DC

## 2016-09-06 MED FILL — JANUVIA 100 MG TABLET: 100 | 90 days supply | Qty: 90 | Fill #1

## 2016-09-06 MED FILL — METFORMIN HCL ER 500 MG TAB: 500 | 90 days supply | Qty: 360 | Fill #1

## 2016-09-06 MED FILL — CIPROFLOXACIN HCL 500 MG TA: 500 | 5 days supply | Qty: 10 | Fill #0

## 2016-09-06 NOTE — Progress Notes (Signed)

## 2016-09-13 ENCOUNTER — Encounter: Payer: Self-pay | Admitting: Family Medicine

## 2016-09-23 ENCOUNTER — Encounter: Payer: Self-pay | Admitting: Internal Medicine

## 2016-09-23 ENCOUNTER — Ambulatory Visit (INDEPENDENT_AMBULATORY_CARE_PROVIDER_SITE_OTHER): Payer: 59 | Admitting: Internal Medicine

## 2016-09-23 VITALS — BP 110/64 | HR 110 | Temp 97.9°F | Wt 292.6 lb

## 2016-09-23 DIAGNOSIS — Z794 Long term (current) use of insulin: Secondary | ICD-10-CM | POA: Diagnosis not present

## 2016-09-23 DIAGNOSIS — E1165 Type 2 diabetes mellitus with hyperglycemia: Secondary | ICD-10-CM

## 2016-09-23 LAB — POCT GLYCOSYLATED HEMOGLOBIN (HGB A1C): Hemoglobin A1C: 8

## 2016-09-23 NOTE — Patient Instructions (Addendum)
Please continue: - Metformin ER 1000 mg 2x a day. - Januvia 100 mg before b'fast. - VGo 40: - smaller meal 6 clicks - larger meal: 8 clicks - snack: 1-2 clicks   Please return in 3 months with your sugar log.

## 2016-09-23 NOTE — Progress Notes (Signed)
Patient ID: Kelsey Vang, female   DOB: Feb 06, 1976, 41 y.o.   MRN: [id number]   HPI: Kelsey Vang is a 41 y.o.-year-old female, returning for f/u for DM2, dx in 2010, insulin-dependent since 01/2016, uncontrolled, without long term complications. She is the wife of [name], who is also my pt. Last visit 3 mo ago.  She had several steroid inj since last visit: joint pain, migraines. Sugars are higher during this period, however, they have improved quite significantly in the last month.  Last hemoglobin A1c was: Lab Results  Component Value Date   HGBA1C 8.1 06/24/2016  02/26/2016: HbA1c 9.2%. Surprisingly, the fructosamine level checked at the same time was 271 (corresponding to the calculated  HbA1c of 6.2%, which, however, does not correlate with the sugars checked at home) 04/07/2015: HbA1c 7.1%  Pt was on a regimen of: - Metformin ER 1000 mg 1x a day, with b'fast (had N/D/AP with regular metformin). - Januvia 100 mg in a.m. for 5 mo (but has a h/o gall stones pancreatitis in 1995 and had flares afterwards, despite cholecystectomy) - Lantus 35 units at bedtime - Novolog 5 units 3x a day, before meals Tried Farxiga >> elevated Cr and Invokana >> yeast infection.  Now on - Metformin ER 1000 mg 2x a day. - Januvia 100 mg before b'fast. - VGo 30 >> 40:  - smaller meal 4 >> 6 clicks - larger meal: 6 >> 8 clicks - snack: 1-2 clicks   Pt checks her sugars 4x a day and they are (in last 1 mo): - am: 150-240 >> 163-215 >> 109-139 - 2h after b'fast: n/c  - before lunch: n/c >> 107-174, 197 >> 117-139 - 2h after lunch: n/c >> 123-164 - before dinner: n/c >> 109-196 >> 117-147 - 2h after dinner: n/c >> 127-224 >> 137-163 - bedtime: 200-220 >> >> 121-202 >> 136-176 - nighttime: n/c No lows. Lowest sugar was 120 (sick) >> 10 >> 109 ; she has hypoglycemia awareness at 150.  Highest sugar was 500s (steroid inj), 300s OTW >> 272 >> 242  She had steroid inj in  02/2016.  Glucometer: True Metric >> will change to AccuCheck Guide  Pt's meals are: - Breakfast: instant oatmeal or yoghurt + banana - Lunch: salad + meat  - Dinner: meat + 1-2 veggies, less bread - Snacks: not eating past 7 pm; trail mix  - No CKD, last BUN/creatinine:  Lab Results  Component Value Date   BUN 12 07/09/2016   BUN 9 06/25/2016   CREATININE 0.77 07/09/2016   CREATININE 0.63 06/25/2016  02/26/2016: Glucose 143, BUN/creatinine 22/0.63, EGFR 107 On Lisinopril 5 mg. - last set of lipids: Lab Results  Component Value Date   CHOL 156 06/25/2016   HDL 53 06/25/2016   LDLCALC 61 06/25/2016   TRIG 212 (H) 06/25/2016   CHOLHDL 2.9 06/25/2016   02/26/2016: 160/179/52/71 She is on Lipitor, Zetia. - last eye exam was 07/04/2016. - Denies numbness and tingling in her feet. She is on Lyrica for carpal tunnel in hands and pain in feet from a previous injury.  ROS: Constitutional: no weight gain/no weight loss,+ fatigue, no subjective hyperthermia, no subjective hypothermia Eyes: no blurry vision, no xerophthalmia ENT: no sore throat, no nodules palpated in throat, no dysphagia, no odynophagia, no hoarseness Cardiovascular: no CP/no SOB/no palpitations/no leg swelling Respiratory: no cough/no SOB/no wheezing Gastrointestinal: + N/+ V/no D/no C/no acid reflux Musculoskeletal: + muscle aches/+ joint aches Skin: no rashes, no hair loss  Neurological: no tremors/no numbness/no tingling/no dizziness, + HA  I reviewed pt's medications, allergies, PMH, social hx, family hx, and changes were documented in the history of present illness. Otherwise, unchanged from my initial visit note.   Past Medical History:  Diagnosis Date  . Diabetes mellitus without complication ([place])   . Hyperlipemia    Past Surgical History:  Procedure Laterality Date  . CHOLECYSTECTOMY    . ENDOMETRIAL ABLATION    . GALLBLADDER SURGERY    . TUBAL LIGATION     Social History   Social History  .  Marital status: Married    Spouse name: [name]   . Number of children: 1   Occupational History  . CMA   Social History Main Topics  . Smoking status: Current Every Day Smoker    Packs/day: 1.00    Types: Cigarettes  . Smokeless tobacco: Never Used  . Alcohol use No  . Drug use: No   Current Outpatient Prescriptions on File Prior to Visit  Medication Sig Dispense Refill  . ACCU-CHEK FASTCLIX LANCETS MISC Use to check sugar 2-3 times daily. 300 each 5  . amitriptyline (ELAVIL) 50 MG tablet Take 50 mg by mouth at bedtime.    Marland Kitchen atorvastatin (LIPITOR) 80 MG tablet Take 1 tablet (80 mg total) by mouth daily. 90 tablet 3  . Blood Glucose Monitoring Suppl (ACCU-CHEK AVIVA) device Use as instructed to check sugar daily 1 each 0  . cetirizine (ZYRTEC) 10 MG tablet Take 10 mg by mouth daily.    . Cholecalciferol (VITAMIN D) 2000 units CAPS Take 1 capsule by mouth daily.    . cyclobenzaprine (FLEXERIL) 10 MG tablet Take 1 tablet (10 mg total) by mouth 3 (three) times daily as needed for muscle spasms. 90 tablet 0  . ezetimibe (ZETIA) 10 MG tablet Take 1 tablet (10 mg total) by mouth daily. 90 tablet 3  . fluticasone (FLONASE) 50 MCG/ACT nasal spray Place 1 spray into both nostrils 2 (two) times daily.    Marland Kitchen glucose blood (ACCU-CHEK AVIVA) test strip Use as instructed to check sugar 2-3 times daily 300 each 5  . hydrOXYzine (ATARAX/VISTARIL) 25 MG tablet Take 1-2 tablets (25-50 mg total) by mouth every 6 (six) hours as needed for itching. 60 tablet 0  . Insulin Disposable Pump (V-GO 40) KIT Use one reservoir daily 1 kit 2  . insulin lispro (HUMALOG) 100 UNIT/ML injection Use up to 70 units a day in the VGo 30 mL 2  . lisinopril (PRINIVIL,ZESTRIL) 5 MG tablet Take 1 tablet (5 mg total) by mouth daily. 90 tablet 1  . metFORMIN (GLUCOPHAGE-XR) 500 MG 24 hr tablet Take 2 tablets (1,000 mg total) by mouth 2 (two) times daily with a meal. 360 tablet 3  . montelukast (SINGULAIR) 10 MG tablet  Take 1 tablet (10 mg total) by mouth at bedtime. 90 tablet 3  . Omega-3 Fatty Acids (FISH OIL) 1000 MG CAPS Take 1 capsule by mouth 2 (two) times daily.    . pantoprazole (PROTONIX) 40 MG tablet Take 1 tablet (40 mg total) by mouth 2 (two) times daily. 60 tablet 2  . pregabalin (LYRICA) 50 MG capsule Take 50 mg by mouth daily.     . sitaGLIPtin (JANUVIA) 100 MG tablet Take 1 tablet (100 mg total) by mouth daily. 90 tablet 3  . insulin glargine (LANTUS) 100 UNIT/ML injection Inject 12 Units into the skin at bedtime.      No current facility-administered medications on file prior  to visit.    Allergies  Allergen Reactions  . Gabapentin Other (See Comments)  . Levemir [Insulin Detemir] Dermatitis    Whelps at injection site  . Keflex [Cephalexin] Rash   Family History  Problem Relation Age of Onset  . Cancer Mother   . Diabetes Father    PE: BP 110/64 (BP Location: Right Arm, Patient Position: Sitting, Cuff Size: Large)   Pulse (!) 110   Temp 97.9 F (36.6 C) (Oral)   Wt 292 lb 9.6 oz (132.7 kg)   LMP 09/16/2016   SpO2 96%   BMI 53.52 kg/m  Wt Readings from Last 3 Encounters:  09/23/16 292 lb 9.6 oz (132.7 kg)  08/13/16 290 lb (131.5 kg)  08/05/16 289 lb 4.8 oz (131.2 kg)   Constitutional: obese, in NAD Eyes: PERRLA, EOMI, no exophthalmos ENT: moist mucous membranes, no thyromegaly, no cervical lymphadenopathy Cardiovascular: RRR, No MRG Respiratory: CTA B Gastrointestinal: abdomen soft, NT, ND, BS+ Musculoskeletal: no deformities, strength intact in all 4 Skin: moist, warm, no rashes Neurological: no tremor with outstretched hands, DTR normal in all 4   ASSESSMENT: 1. DM2, insulin-dependent, uncontrolled, without long term complications, but with hypoglycemia - We discussed about GLP-1 receptor agonist, however, we cannot use these due to her history of pancreatitis.   PLAN:  1. Patient with long-standing, uncontrolled diabetes, on oral diabetic regimen and VGo 40  patch pump.  Since last visit, her sugars are higher, as she had several steroid injections,and she had to increase the number of clicks per meal. However, in the last month, they have improved quite nicely!  her A1c today is still high, at 8%, but I believe this would be better next visit.  - We will not change her regimen at this visit, but I encouraged her to continue with the higher number of clicks per meal - I suggested to:  Patient Instructions  Please continue: - Metformin ER 1000 mg 2x a day. - Januvia 100 mg before b'fast. - VGo 40: - smaller meal 6 clicks - larger meal: 8 clicks - snack: 1-2 clicks   Please return in 3 months with your sugar log.   - continue checking sugars at different times of the day - check 3x a day, rotating checks - advised for yearly eye exams >> she is UTD - Return to clinic in 3 mo with sugar log   Philemon Kingdom, MD PhD [facility]

## 2016-09-28 ENCOUNTER — Encounter: Payer: Self-pay | Admitting: Family Medicine

## 2016-09-29 ENCOUNTER — Telehealth: Payer: Self-pay

## 2016-09-29 ENCOUNTER — Other Ambulatory Visit: Payer: Self-pay

## 2016-09-29 MED ORDER — METFORMIN HCL ER 500 MG PO TB24: 1000.0000 mg | ORAL_TABLET | Freq: Two times a day (BID) | ORAL | 3 refills | Status: DC

## 2016-09-29 NOTE — Telephone Encounter (Signed)
Yes, ok. Raynelle Fanning, can you please send this?

## 2016-09-29 NOTE — Telephone Encounter (Signed)
Please see patient's email requesting refill of Metformin to be sent to [facility] pharmacy.  It was sent to Dr. Creta Levin, but I see in the chart you are following the pt now for DM.  Thank you.

## 2016-09-29 NOTE — Telephone Encounter (Signed)
Submitted

## 2016-09-30 MED FILL — AMITRIPTYLINE HCL 50 MG TAB: 50 | 90 days supply | Qty: 90 | Fill #2

## 2016-09-30 MED FILL — PANTOPRAZOLE SOD DR 40 MG T: 40 | 30 days supply | Qty: 60 | Fill #2

## 2016-10-04 ENCOUNTER — Other Ambulatory Visit: Payer: Self-pay | Admitting: Internal Medicine

## 2016-10-04 MED FILL — VGO 30 DISPOSABLE DEVICE: 30 days supply | Qty: 30 | Fill #2

## 2016-10-04 MED FILL — HumaLOG 100 UNIT/ML SOLN: 100 | 42 days supply | Qty: 30 | Fill #1

## 2016-10-13 ENCOUNTER — Other Ambulatory Visit: Payer: Self-pay

## 2016-10-13 NOTE — Telephone Encounter (Signed)
Patient needs script for Lyrica 50mg . [place] outpatient pharm.  Please advise,   Thank you,  -LL

## 2016-10-14 MED FILL — ATORVASTATIN 80 MG TABLET: 80 | 90 days supply | Qty: 90 | Fill #1

## 2016-10-21 MED FILL — EZETIMIBE 10 MG TAB: 10 | 90 days supply | Qty: 90 | Fill #1

## 2016-11-01 ENCOUNTER — Encounter: Payer: Self-pay | Admitting: Internal Medicine

## 2016-11-02 ENCOUNTER — Other Ambulatory Visit: Payer: Self-pay

## 2016-11-02 MED ORDER — PREGABALIN 50 MG PO CAPS: 50.0000 mg | ORAL_CAPSULE | Freq: Every day | ORAL | 3 refills | Status: DC

## 2016-11-02 MED ORDER — V-GO 40 KIT: PACK | 2 refills | Status: DC

## 2016-11-04 ENCOUNTER — Encounter: Payer: Self-pay | Admitting: Family Medicine

## 2016-11-04 ENCOUNTER — Ambulatory Visit (INDEPENDENT_AMBULATORY_CARE_PROVIDER_SITE_OTHER): Payer: 59 | Admitting: Family Medicine

## 2016-11-04 ENCOUNTER — Other Ambulatory Visit: Payer: Self-pay

## 2016-11-04 VITALS — BP 109/75 | HR 126 | Temp 99.0°F | Resp 18 | Ht 62.0 in | Wt 291.0 lb

## 2016-11-04 VITALS — BP 108/76 | HR 98 | Resp 16 | Ht 62.0 in | Wt 291.2 lb

## 2016-11-04 DIAGNOSIS — M545 Low back pain, unspecified: Secondary | ICD-10-CM

## 2016-11-04 DIAGNOSIS — M62830 Muscle spasm of back: Secondary | ICD-10-CM

## 2016-11-04 DIAGNOSIS — Z794 Long term (current) use of insulin: Principal | ICD-10-CM

## 2016-11-04 DIAGNOSIS — E119 Type 2 diabetes mellitus without complications: Secondary | ICD-10-CM

## 2016-11-04 MED ORDER — MELOXICAM 7.5 MG PO TABS: 7.5000 mg | ORAL_TABLET | Freq: Every day | ORAL | 0 refills | Status: DC

## 2016-11-04 MED FILL — MELOXICAM 7.5 MG TABLET: 7.5 | 15 days supply | Qty: 30 | Fill #0

## 2016-11-04 MED FILL — UNIFINE PENTIPS 8MM 31G: 31G X 8 MM | 30 days supply | Qty: 100 | Fill #3

## 2016-11-04 MED FILL — FREESTYLE LITE TEST STRIP: 66 days supply | Qty: 200 | Fill #1

## 2016-11-04 NOTE — Telephone Encounter (Signed)
Patient called to advise that she needs her insulin refill sent to  [facility] [place], [place] - [address] [facility] [place]. [phone] (Phone) [phone] (Fax)   Patient also wants to speak to someone clinical about how much she is supposed to use for her shots for each meal in greater detail.  Call patient to advise and adjust medication refill.

## 2016-11-04 NOTE — Patient Outreach (Signed)
Kelsey Vang [facility]) Care Management   11/04/2016  Kelsey Vang 05/08/1975 [id number]  Kelsey Vang is an 41 y.o. female.   Member seen for follow up office visit for Link to Wellness program for self management of Type 2 diabetes  Subjective: Member states that she stopped using her Vgo last week as it kept coming off.  States she tried using skin prep and even used a Tegraderm dressing which did not help.  States she notified Dr. Cruzita Lederer and she is now back on Lantus and she takes her Humalog a mealtimes.  States she started having rt hip pain this last week and she is going to the doctor today to have it checked.  States she is checking her blood sugars at least 3 times a day with ranges in the 120-130 fasting and 145-274 after meals.  States that her sugars have improved since she has not needed any steroid shots for the last month or so.  States that the only exercise she gets is walking at work and she does walk to the hospital for lunch a few times a week.  States she has started drinking regular [employer] again and she is drinking around 3 a day.  Objective:   Review of Systems  Musculoskeletal: Positive for joint pain.    Physical Exam Today's Vitals   11/04/16 0901 11/04/16 0905  BP: 108/76   Pulse: 98   Resp: 16   SpO2: 95%   Weight: 291 lb 3.2 oz (132.1 kg)   Height: 1.575 m (_0 )   PainSc: 6  6   PainLoc: Hip    Encounter Medications:   Outpatient Encounter Prescriptions as of 11/04/2016  Medication Sig  . ACCU-CHEK FASTCLIX LANCETS MISC Use to check sugar 2-3 times daily.  Marland Kitchen amitriptyline (ELAVIL) 50 MG tablet Take 50 mg by mouth at bedtime.  Marland Kitchen atorvastatin (LIPITOR) 80 MG tablet Take 1 tablet (80 mg total) by mouth daily.  . Blood Glucose Monitoring Suppl (ACCU-CHEK AVIVA) device Use as instructed to check sugar daily  . cetirizine (ZYRTEC) 10 MG tablet Take 10 mg by mouth daily.  . Cholecalciferol (VITAMIN D) 2000 units CAPS Take 1 capsule by  mouth daily.  . cyclobenzaprine (FLEXERIL) 10 MG tablet Take 1 tablet (10 mg total) by mouth 3 (three) times daily as needed for muscle spasms.  Marland Kitchen ezetimibe (ZETIA) 10 MG tablet Take 1 tablet (10 mg total) by mouth daily.  . fluticasone (FLONASE) 50 MCG/ACT nasal spray Place 1 spray into both nostrils 2 (two) times daily.  Marland Kitchen glucose blood (ACCU-CHEK AVIVA) test strip Use as instructed to check sugar 2-3 times daily  . hydrOXYzine (ATARAX/VISTARIL) 25 MG tablet Take 1-2 tablets (25-50 mg total) by mouth every 6 (six) hours as needed for itching.  . insulin glargine (LANTUS) 100 UNIT/ML injection Inject 30 Units into the skin at bedtime.   . insulin lispro (HUMALOG) 100 UNIT/ML injection Use up to 70 units a day in the VGo (Patient taking differently: 3 (three) times daily with meals. 6-8 units large meal 4-6 units small meal 2-4 units snacks)  . lisinopril (PRINIVIL,ZESTRIL) 5 MG tablet Take 1 tablet (5 mg total) by mouth daily.  . metFORMIN (GLUCOPHAGE-XR) 500 MG 24 hr tablet Take 2 tablets (1,000 mg total) by mouth 2 (two) times daily with a meal.  . montelukast (SINGULAIR) 10 MG tablet Take 1 tablet (10 mg total) by mouth at bedtime.  . Omega-3 Fatty Acids (FISH OIL) 1000 MG  CAPS Take 1 capsule by mouth 2 (two) times daily.  . pantoprazole (PROTONIX) 40 MG tablet Take 1 tablet (40 mg total) by mouth 2 (two) times daily.  . pregabalin (LYRICA) 50 MG capsule Take 1 capsule (50 mg total) by mouth daily.  . sitaGLIPtin (JANUVIA) 100 MG tablet Take 1 tablet (100 mg total) by mouth daily.  . Insulin Disposable Pump (V-GO 40) KIT USE ONE RESERVOIR DAILY (Patient not taking: Reported on 11/04/2016)   No facility-administered encounter medications on file as of 11/04/2016.     Functional Status:   In your present state of health, do you have any difficulty performing the following activities: 08/05/2016 04/04/2016  Hearing? N N  Vision? N N  Difficulty concentrating or making decisions? N N  Walking  or climbing stairs? N N  Dressing or bathing? N N  Doing errands, shopping? N N  Some recent data might be hidden    Fall/Depression Screening:    Fall Risk  11/04/2016 08/05/2016 06/25/2016  Falls in the past year? No No No  Number falls in past yr: - - -  Injury with Fall? - - -   PHQ 2/9 Scores 11/04/2016 08/05/2016 06/25/2016 05/07/2016 04/30/2016 04/04/2016 04/04/2016  PHQ - 2 Score 0 0 0 0 0 0 0    Assessment:  Member seen for follow up office visit for Link to Wellness program for self management of Type 2 diabetes. Member is not at diabetes self management goal of hemoglobin A1C of 7% or below with last reading 8%   Member now off  the V-Go insulin pump system and on bolus/basal insulin.   Reports going back to drinking 3 regular [employer] a day. Member to see endocrinologist on 12/23/16.  Member is walking at work as her only exercise Member is currently smoking 1/2 pack/day. Member is to see provider today about rt hip pain.   Member had annual eye exams and up to date withregular dental check ups.   Plan:   Plan to eat 30-45 GM (2-3) servings of carbohydrate a meal and 15 GM for snacks.  Plan to eat protein with your snacks Plan to stop drinking [facility] and switch to diet soda Plan to check blood sugar 3-4 times a day fasting and 1 -2hrs after a meal.  Goals of 80-130 fasting and 180 or less after eating. Plan to walk 3-4 times a week for 10 minutes at lunch break.  Goal of 150 minutes a week Plan to look into the [facility] [phone] or Weight Watchers Plan to see Dr. Cruzita Lederer on 12/23/16 Plan to see Link to Wellness on 02/14/17 at [place] Problem One     Most Recent Value  Care Plan Problem One  Elevated blood sugars as evidenced by hemoglobin A1C 9.2% related to dx of Type 2 DM  Role Documenting the Problem One  Care Management [place] for Problem One  Active  [facility] Long Term Goal   Member will decrease hemoglobin A1C to  7 or below in the next 90 days  [facility] Long Term Goal Start Date  11/04/16  Interventions for Problem One Long Term Goal  Reviewed CHO counting and portion  control, Encouraged to stop drinkiing regular sodas Reinforced importance of losing weight, Encouraged to look into  [facility] and Weight [facility] with the [facility] discount, Reinforced to stop smoking and to attend the Quit smart classes, Reviewed blood sugar goals before and  after eating, Instructed to keep her appt with endocrinologist on 12/23/16    Peter Garter RN, [facility] Care Management Coordinator-Link to [place] Management [phone]

## 2016-11-04 NOTE — Progress Notes (Signed)
Subjective:    Patient ID: Kelsey Vang, female    DOB: 01/12/1976, 41 y.o.   MRN: [id number]  HPI  Kelsey Vang is a 41 y.o. female   Presents with right sided hip pain. Started about 5 days ago - woke up with ache that morning. Had been driving more than usual the day before, but no known injury. No known prior similar sx's.  Tried alleve 1 tab BID initially - was helping until yesterday - more sore at work, Kelsey Vang. Usual job activities. Also tried salon pas.   Flexeril 21m twice yesterday - did help with sleep off and on. Off an on today - stiff/tight.   No bowel or bladder incontinence, no saddle anesthesia, no lower extremity weakness.   Pain starts at low back/buttock and radiates to side of hip only. No leg radiation.  No rash.   Patient Active Problem List   Diagnosis Date Noted  . Obstructive apnea 04/23/2015  . Type 2 diabetes mellitus with hyperglycemia, with long-term current use of insulin ([place] 12/27/2013  . Current tobacco use 12/27/2013  . Morbid (severe) obesity due to excess calories ([place] 12/27/2013  . HLD (hyperlipidemia) 12/27/2013   Past Medical History:  Diagnosis Date  . Diabetes mellitus without complication ([facility]   . Hyperlipemia    Past Surgical History:  Procedure Laterality Date  . CHOLECYSTECTOMY    . ENDOMETRIAL ABLATION    . GALLBLADDER SURGERY    . TUBAL LIGATION     Allergies  Allergen Reactions  . Gabapentin Other (See Comments)  . Levemir [Insulin Detemir] Dermatitis    Whelps at injection site  . Keflex [Cephalexin] Rash   Prior to Admission medications   Medication Sig Start Date End Date Taking? Authorizing Provider  ACCU-CHEK FASTCLIX LANCETS MISC Use to check sugar 2-3 times daily. 06/01/16  Yes GPhilemon Kingdom MD  amitriptyline (ELAVIL) 50 MG tablet Take 50 mg by mouth at bedtime.   Yes [provider]  atorvastatin (LIPITOR) 80 MG tablet Take 1 tablet (80 mg total) by mouth daily. 06/25/16  Yes SDelia ChimesA,  MD  Blood Glucose Monitoring Suppl (ACCU-CHEK AVIVA) device Use as instructed to check sugar daily 06/01/16 06/01/17 Yes GPhilemon Kingdom MD  cetirizine (ZYRTEC) 10 MG tablet Take 10 mg by mouth daily.   Yes [provider]  Cholecalciferol (VITAMIN D) 2000 units CAPS Take 1 capsule by mouth daily.   Yes [provider]  cyclobenzaprine (FLEXERIL) 10 MG tablet Take 1 tablet (10 mg total) by mouth 3 (three) times daily as needed for muscle spasms. 08/17/16  Yes SDelia ChimesA, MD  ezetimibe (ZETIA) 10 MG tablet Take 1 tablet (10 mg total) by mouth daily. 06/25/16  Yes Stallings, Zoe A, MD  fluticasone (FLONASE) 50 MCG/ACT nasal spray Place 1 spray into both nostrils 2 (two) times daily.   Yes [provider]  glucose blood (ACCU-CHEK AVIVA) test strip Use as instructed to check sugar 2-3 times daily 06/01/16  Yes GPhilemon Kingdom MD  hydrOXYzine (ATARAX/VISTARIL) 25 MG tablet Take 1-2 tablets (25-50 mg total) by mouth every 6 (six) hours as needed for itching. 08/13/16  Yes SShawnee Knapp MD  insulin glargine (LANTUS) 100 UNIT/ML injection Inject 30 Units into the skin at bedtime.    Yes [provider]  insulin lispro (HUMALOG) 100 UNIT/ML injection Use up to 70 units a day in the VGo Patient taking differently: 3 (three) times daily with meals. 6-8 units large meal 4-6  units small meal 2-4 units snacks 08/31/16  Yes Philemon Kingdom, MD  lisinopril (PRINIVIL,ZESTRIL) 5 MG tablet Take 1 tablet (5 mg total) by mouth daily. 08/17/16  Yes Forrest Moron, MD  metFORMIN (GLUCOPHAGE-XR) 500 MG 24 hr tablet Take 2 tablets (1,000 mg total) by mouth 2 (two) times daily with a meal. 09/29/16  Yes Philemon Kingdom, MD  montelukast (SINGULAIR) 10 MG tablet Take 1 tablet (10 mg total) by mouth at bedtime. 08/17/16  Yes Forrest Moron, MD  Omega-3 Fatty Acids (FISH OIL) 1000 MG CAPS Take 1 capsule by mouth 2 (two) times daily.   Yes [provider]  pantoprazole  (PROTONIX) 40 MG tablet Take 1 tablet (40 mg total) by mouth 2 (two) times daily. 07/11/16  Yes Stallings, Zoe A, MD  pregabalin (LYRICA) 50 MG capsule Take 1 capsule (50 mg total) by mouth daily. 11/02/16  Yes Philemon Kingdom, MD  sitaGLIPtin (JANUVIA) 100 MG tablet Take 1 tablet (100 mg total) by mouth daily. 04/22/16  Yes Philemon Kingdom, MD  Insulin Disposable Pump (V-GO 40) KIT USE ONE RESERVOIR DAILY Patient not taking: Reported on 11/04/2016 11/02/16   Philemon Kingdom, MD   Social History   Social History  . Marital status: Married    Spouse name: N/A  . Number of children: N/A  . Years of education: N/A   Occupational History  . Not on file.   Social History Main Topics  . Smoking status: Current Every Day Smoker    Packs/day: 0.50    Years: 20.00    Types: Cigarettes  . Smokeless tobacco: Never Used  . Alcohol use No  . Drug use: No  . Sexual activity: No   Other Topics Concern  . Not on file   Social History Narrative  . No narrative on file    Review of Systems  Constitutional: Negative for chills, fever and unexpected weight change.  Genitourinary: Negative for dysuria.  Other per history of present illness.     Objective:   Physical Exam  Constitutional: She is oriented to person, place, and time. She appears well-developed and well-nourished.  Overweight/obese  HENT:  Head: Normocephalic and atraumatic.  Pulmonary/Chest: Effort normal.  Musculoskeletal:       Lumbar back: She exhibits tenderness (Paraspinals on the right with slight discomfort lower back/buttocks with some range of motion testing). She exhibits normal range of motion and no bony tenderness.  Neurological: She is alert and oriented to person, place, and time. She displays no Babinski's sign on the right side. She displays no Babinski's sign on the left side.  Reflex Scores:      Patellar reflexes are 2+ on the right side and 2+ on the left side.      Achilles reflexes are 2+ on the  right side and 2+ on the left side. Skin: Skin is warm.  Psychiatric: She has a normal mood and affect. Her behavior is normal.  Nontoxic, but uncomfortable with\it.   Vitals:   11/04/16 1150  BP: 109/75  Pulse: (!) 126  Resp: 18  Temp: 99 F (37.2 C)  TempSrc: Oral  SpO2: 98%  Weight: 291 lb (132 kg)  Height: _0  (1.575 m)      Assessment & Plan:   Kelsey Vang is a 41 y.o. female Acute right-sided low back pain without sciatica - Plan: meloxicam (MOBIC) 7.5 MG tablet  Spasm of muscle of lower back - Plan: meloxicam (MOBIC) 7.5 MG tablet Acted strain/sprain of lumbar  spine with secondary spasm and possible sciatica component. No red flags on history or exam, imaging deferred at present.  -Start meloxicam, advised to not combine with other over-the-counter NSAIDs. She has Flexeril at home, can try that up to 3 times per day with heat, gentle range of motion and stretching. Stronger medication such as tramadol were declined.  -Recheck in next 1 week if not improving, sooner if worse. RTC precautions given  - Tachycardia likely due to pain. Other vitals reassuring.  Meds ordered this encounter  Medications  . meloxicam (MOBIC) 7.5 MG tablet    Sig: Take 1-2 tablets (7.5-15 mg total) by mouth daily.    Dispense:  30 tablet    Refill:  0   Patient Instructions    You likely have a sprained ligament or strained muscle in the low back, which can lead to some muscle spasm as well. Try the mobic each morning (do not combine with other over the counter pain relievers), flexeril 3 times per day as needed.  Heat as tolerated. Recheck in next week if not improving. Return to the clinic or go to the nearest emergency room if any of your symptoms worsen or new symptoms occur.     IF you received an x-ray today, you will receive an invoice from [facility] Radiology. Please contact [facility] Rad[facility] at [phone] with questions or concerns regarding your invoice.   IF you  received labwork today, you will receive an invoice from [place]. Please contact [facility] at [phone] with questions or concerns regarding your invoice.   Our billing staff will not be able to assist you with questions regarding bills from these companies.  You will be contacted with the lab results as soon as they are available. The fastest way to get your results is to activate your My Chart account. Instructions are located on the last page of this paperwork. If you have not heard from [place] regarding the results in 2 weeks, please contact this office.       Signed,   Merri Ray, MD [facility] at [place].  11/06/16 4:49 PM

## 2016-11-04 NOTE — Patient Instructions (Signed)
1. Plan to eat 30-45 GM (2-3) servings of carbohydrate a meal and 15 GM for snacks.  Plan to eat protein with your snacks 2. Plan to stop drinking [facility] and switch to diet soda 3. Plan to check blood sugar 3-4 times a day fasting and 1 -2hrs after a meal.  Goals of 80-130 fasting and 180 or less after eating. 4. Plan to walk 3-4 times a week for 10 minutes at lunch break.  Goal of 150 minutes a week 5. Plan to look into the [facility] [phone] or Weight Watchers 6. Plan to see Dr. Elvera Lennox on 12/23/16 7. Plan to see Link to Wellness on 02/14/17 at [facility]

## 2016-11-04 NOTE — Patient Instructions (Addendum)
  You likely have a sprained ligament or strained muscle in the low back, which can lead to some muscle spasm as well. Try the mobic each morning (do not combine with other over the counter pain relievers), flexeril 3 times per day as needed.  Heat as tolerated. Recheck in next week if not improving. Return to the clinic or go to the nearest emergency room if any of your symptoms worsen or new symptoms occur.     IF you received an x-ray today, you will receive an invoice from [facility]. Please contact [facility] Rad[facility] at [phone] with questions or concerns regarding your invoice.   IF you received labwork today, you will receive an invoice from [place]. Please contact [facility] at [phone] with questions or concerns regarding your invoice.   Our billing staff will not be able to assist you with questions regarding bills from these companies.  You will be contacted with the lab results as soon as they are available. The fastest way to get your results is to activate your My Chart account. Instructions are located on the last page of this paperwork. If you have not heard from [place] regarding the results in 2 weeks, please contact this office.

## 2016-11-08 DIAGNOSIS — G4733 Obstructive sleep apnea (adult) (pediatric): Secondary | ICD-10-CM | POA: Diagnosis not present

## 2016-11-10 ENCOUNTER — Encounter: Payer: Self-pay | Admitting: Physician Assistant

## 2016-11-10 ENCOUNTER — Ambulatory Visit (INDEPENDENT_AMBULATORY_CARE_PROVIDER_SITE_OTHER): Payer: 59

## 2016-11-10 ENCOUNTER — Ambulatory Visit (INDEPENDENT_AMBULATORY_CARE_PROVIDER_SITE_OTHER): Payer: 59 | Admitting: Physician Assistant

## 2016-11-10 VITALS — BP 116/78 | HR 106 | Temp 98.3°F | Resp 18 | Ht 62.0 in | Wt 290.4 lb

## 2016-11-10 DIAGNOSIS — M545 Low back pain, unspecified: Secondary | ICD-10-CM

## 2016-11-10 DIAGNOSIS — M5126 Other intervertebral disc displacement, lumbar region: Secondary | ICD-10-CM | POA: Diagnosis not present

## 2016-11-10 DIAGNOSIS — R252 Cramp and spasm: Secondary | ICD-10-CM

## 2016-11-10 MED ORDER — CYCLOBENZAPRINE HCL 10 MG PO TABS: 10.0000 mg | ORAL_TABLET | Freq: Three times a day (TID) | ORAL | 0 refills | Status: DC | PRN

## 2016-11-10 MED ORDER — HYDROCODONE-ACETAMINOPHEN 5-325 MG PO TABS: 1.0000 | ORAL_TABLET | Freq: Three times a day (TID) | ORAL | 0 refills | Status: DC | PRN

## 2016-11-10 NOTE — Patient Instructions (Addendum)
  Please ice the back three times per day for 15 minutes.   You can warm the area just prior to stretching.     IF you received an x-ray today, you will receive an invoice from [facility] Radiology. Please contact [facility]iology at [phone] with questions or concerns regarding your invoice.   IF you received labwork today, you will receive an invoice from [place]. Please contact [facility] at [phone] with questions or concerns regarding your invoice.   Our billing staff will not be able to assist you with questions regarding bills from these companies.  You will be contacted with the lab results as soon as they are available. The fastest way to get your results is to activate your My Chart account. Instructions are located on the last page of this paperwork. If you have not heard from [place] regarding the results in 2 weeks, please contact this office.

## 2016-11-10 NOTE — Progress Notes (Signed)
PRIMARY CARE AT [facility] [address], [place] [address] [phone]  Date:  11/10/2016   Name:  Kelsey Vang   DOB:  Jan 28, 1976   MRN:  [id number]  PCP:  Forrest Moron, MD    History of Present Illness:  Kelsey Vang is a 41 y.o. female patient who presents to PCP with  Chief Complaint  Patient presents with  . Back Pain    follow up from friday pt states it isn't any better      She reports sudden spasms, where she feels like she can barely move.  She has cramps in her legs.  She had pain moreso at her lower right side of back but has now shifted across the entire low back.  Feels like it is migrating up.  No fever, dysuria, or hematuria.  She performed a urine dipstick, but this was clear. She has never had any issues with her low back prior.  No dizziness.  She felt like she was going to fall like her legs were buckling twice secondary to the pain.   She denies abnormal sob or dyspnea.  Patient Active Problem List   Diagnosis Date Noted  . Obstructive apnea 04/23/2015  . Type 2 diabetes mellitus with hyperglycemia, with long-term current use of insulin ([place]) 12/27/2013  . Current tobacco use 12/27/2013  . Morbid (severe) obesity due to excess calories ([place]) 12/27/2013  . HLD (hyperlipidemia) 12/27/2013    Past Medical History:  Diagnosis Date  . Diabetes mellitus without complication ([place])   . Hyperlipemia     Past Surgical History:  Procedure Laterality Date  . CHOLECYSTECTOMY    . ENDOMETRIAL ABLATION    . GALLBLADDER SURGERY    . TUBAL LIGATION      Social History  Substance Use Topics  . Smoking status: Current Every Day Smoker    Packs/day: 0.50    Years: 20.00    Types: Cigarettes  . Smokeless tobacco: Never Used  . Alcohol use No    Family History  Problem Relation Age of Onset  . Cancer Mother   . Diabetes Father     Allergies  Allergen Reactions  . Gabapentin Other (See Comments)  . Levemir [Insulin Detemir] Dermatitis   Whelps at injection site  . Keflex [Cephalexin] Rash    Medication list has been reviewed and updated.  Current Outpatient Prescriptions on File Prior to Visit  Medication Sig Dispense Refill  . ACCU-CHEK FASTCLIX LANCETS MISC Use to check sugar 2-3 times daily. 300 each 5  . amitriptyline (ELAVIL) 50 MG tablet Take 50 mg by mouth at bedtime.    Marland Kitchen atorvastatin (LIPITOR) 80 MG tablet Take 1 tablet (80 mg total) by mouth daily. 90 tablet 3  . Blood Glucose Monitoring Suppl (ACCU-CHEK AVIVA) device Use as instructed to check sugar daily 1 each 0  . cetirizine (ZYRTEC) 10 MG tablet Take 10 mg by mouth daily.    . Cholecalciferol (VITAMIN D) 2000 units CAPS Take 1 capsule by mouth daily.    . cyclobenzaprine (FLEXERIL) 10 MG tablet Take 1 tablet (10 mg total) by mouth 3 (three) times daily as needed for muscle spasms. 90 tablet 0  . ezetimibe (ZETIA) 10 MG tablet Take 1 tablet (10 mg total) by mouth daily. 90 tablet 3  . fluticasone (FLONASE) 50 MCG/ACT nasal spray Place 1 spray into both nostrils 2 (two) times daily.    Marland Kitchen glucose blood (ACCU-CHEK AVIVA) test strip Use as instructed to check  sugar 2-3 times daily 300 each 5  . hydrOXYzine (ATARAX/VISTARIL) 25 MG tablet Take 1-2 tablets (25-50 mg total) by mouth every 6 (six) hours as needed for itching. 60 tablet 0  . insulin glargine (LANTUS) 100 UNIT/ML injection Inject 30 Units into the skin at bedtime.     . insulin lispro (HUMALOG) 100 UNIT/ML injection Use up to 70 units a day in the VGo (Patient taking differently: 3 (three) times daily with meals. 6-8 units large meal 4-6 units small meal 2-4 units snacks) 30 mL 2  . lisinopril (PRINIVIL,ZESTRIL) 5 MG tablet Take 1 tablet (5 mg total) by mouth daily. 90 tablet 1  . meloxicam (MOBIC) 7.5 MG tablet Take 1-2 tablets (7.5-15 mg total) by mouth daily. 30 tablet 0  . metFORMIN (GLUCOPHAGE-XR) 500 MG 24 hr tablet Take 2 tablets (1,000 mg total) by mouth 2 (two) times daily with a meal. 360  tablet 3  . montelukast (SINGULAIR) 10 MG tablet Take 1 tablet (10 mg total) by mouth at bedtime. 90 tablet 3  . Omega-3 Fatty Acids (FISH OIL) 1000 MG CAPS Take 1 capsule by mouth 2 (two) times daily.    . pantoprazole (PROTONIX) 40 MG tablet Take 1 tablet (40 mg total) by mouth 2 (two) times daily. 60 tablet 2  . pregabalin (LYRICA) 50 MG capsule Take 1 capsule (50 mg total) by mouth daily. 30 capsule 3  . sitaGLIPtin (JANUVIA) 100 MG tablet Take 1 tablet (100 mg total) by mouth daily. 90 tablet 3  . Insulin Disposable Pump (V-GO 40) KIT USE ONE RESERVOIR DAILY (Patient not taking: Reported on 11/04/2016) 30 kit 2   No current facility-administered medications on file prior to visit.     ROS ROS otherwise unremarkable unless listed above.  Physical Examination: BP 116/78   Pulse (!) 106   Temp 98.3 F (36.8 C) (Oral)   Resp 18   Ht '5\' 2"'  (1.575 m)   Wt 290 lb 6.4 oz (131.7 kg)   SpO2 96%   BMI 53.11 kg/m  Ideal Body Weight: Weight in (lb) to have BMI = 25: 136.4  Physical Exam  Constitutional: She is oriented to person, place, and time. She appears well-developed and well-nourished. No distress.  HENT:  Head: Normocephalic and atraumatic.  Right Ear: External ear normal.  Left Ear: External ear normal.  Eyes: Pupils are equal, round, and reactive to light. Conjunctivae and EOM are normal.  Cardiovascular: Regular rhythm and normal heart sounds.  Tachycardia present.  Exam reveals no gallop and no friction rub.   No murmur heard. Pulses:      Radial pulses are 2+ on the right side, and 2+ on the left side.       Dorsalis pedis pulses are 2+ on the right side, and 2+ on the left side.  Lower extremity calf measure equivalent to eachother  Pulmonary/Chest: Effort normal. No respiratory distress.  Musculoskeletal:  Lumbar spinous tenderness that crosses into sacrum.  Normal range of motion.  Neurological: She is alert and oriented to person, place, and time.  Skin: She is not  diaphoretic.  Psychiatric: She has a normal mood and affect. Her behavior is normal.     Dg Lumbar Spine Complete  Result Date: 11/10/2016 CLINICAL DATA:  Low back pain and spasms for 2 weeks. EXAM: LUMBAR SPINE - COMPLETE 4+ VIEW; SACRUM AND COCCYX - 2+ VIEW COMPARISON:  None. FINDINGS: There is no evidence of lumbar spine fracture. Alignment is normal except for trace anterolisthesis  L4-5 and trace retrolisthesis L3-4, which appear facet mediated. Intervertebral disc spaces are maintained. In the sacrum, no acute or focal osseous findings. Sacrococcygeal articulation appears intact. Aortic atherosclerosis. Cholecystectomy and pelvic surgical clips. IMPRESSION: No acute findings. Electronically Signed   By: Staci Righter M.D.   On: 11/10/2016 09:00   Dg Sacrum/coccyx  Result Date: 11/10/2016 CLINICAL DATA:  Low back pain and spasms for 2 weeks. EXAM: LUMBAR SPINE - COMPLETE 4+ VIEW; SACRUM AND COCCYX - 2+ VIEW COMPARISON:  None. FINDINGS: There is no evidence of lumbar spine fracture. Alignment is normal except for trace anterolisthesis L4-5 and trace retrolisthesis L3-4, which appear facet mediated. Intervertebral disc spaces are maintained. In the sacrum, no acute or focal osseous findings. Sacrococcygeal articulation appears intact. Aortic atherosclerosis. Cholecystectomy and pelvic surgical clips. IMPRESSION: No acute findings. Electronically Signed   By: Staci Righter M.D.   On: 11/10/2016 09:00     Assessment and Plan: Kyara Boxer is a 41 y.o. female who is here today for back pain. --advised icing. --norco at this time.  If she continues to have the pain, will consider referral to ortho. --given stretches. --pulse consistently high in past visits--not representative of acute process. Acute bilateral low back pain without sciatica - Plan: Basic metabolic panel, DG Lumbar Spine Complete, DG Sacrum/Coccyx, CK, HYDROcodone-acetaminophen (NORCO) 5-325 MG tablet, cyclobenzaprine (FLEXERIL)  10 MG tablet  Muscle cramping - Plan: CK, HYDROcodone-acetaminophen (NORCO) 5-325 MG tablet, cyclobenzaprine (FLEXERIL) 10 MG tablet  Ivar Drape, PA-C [facility] 7/27/20189:04 AM

## 2016-11-11 LAB — SPECIMEN STATUS

## 2016-11-15 ENCOUNTER — Encounter: Payer: Self-pay | Admitting: Internal Medicine

## 2016-11-16 ENCOUNTER — Other Ambulatory Visit: Payer: Self-pay

## 2016-11-16 MED ORDER — INSULIN LISPRO 100 UNIT/ML ~~LOC~~ SOLN: SUBCUTANEOUS | 2 refills | Status: DC

## 2016-11-16 MED FILL — HumaLOG 100 UNIT/ML SOLN: 100 | 42 days supply | Qty: 30 | Fill #0

## 2016-11-17 LAB — BASIC METABOLIC PANEL
BUN/Creatinine Ratio: 16 (ref 9–23)
BUN: 12 mg/dL (ref 6–24)
CO2: 21 mmol/L (ref 20–29)
CREATININE: 0.76 mg/dL (ref 0.57–1.00)
Calcium: 10.2 mg/dL (ref 8.7–10.2)
Chloride: 98 mmol/L (ref 96–106)
GFR calc Af Amer: 113 mL/min/{1.73_m2} (ref >59)
GFR calc non Af Amer: 98 mL/min/{1.73_m2} (ref >59)
GLUCOSE: 186 mg/dL — AB (ref 65–99)
POTASSIUM: 5.3 mmol/L — AB (ref 3.5–5.2)
SODIUM: 138 mmol/L (ref 134–144)

## 2016-11-17 LAB — CK: Total CK: 99 U/L (ref 24–173)

## 2016-12-09 MED FILL — LISINOPRIL 5 MG TABLET: 5 | 90 days supply | Qty: 90 | Fill #1

## 2016-12-09 MED FILL — MONTELUKAST SOD 10 MG TAB: 10 | 90 days supply | Qty: 90 | Fill #1

## 2016-12-09 MED FILL — METFORMIN HCL ER 500 MG TAB: 500 | 90 days supply | Qty: 360 | Fill #2

## 2016-12-09 MED FILL — JANUVIA 100 MG TABLET: 100 | 90 days supply | Qty: 90 | Fill #2

## 2016-12-23 ENCOUNTER — Ambulatory Visit (INDEPENDENT_AMBULATORY_CARE_PROVIDER_SITE_OTHER): Payer: 59 | Admitting: Internal Medicine

## 2016-12-23 ENCOUNTER — Telehealth: Payer: Self-pay | Admitting: Internal Medicine

## 2016-12-23 ENCOUNTER — Encounter: Payer: Self-pay | Admitting: Internal Medicine

## 2016-12-23 VITALS — BP 110/60 | HR 102 | Ht 62.0 in | Wt 292.4 lb

## 2016-12-23 DIAGNOSIS — E784 Other hyperlipidemia: Secondary | ICD-10-CM | POA: Diagnosis not present

## 2016-12-23 DIAGNOSIS — E7849 Other hyperlipidemia: Secondary | ICD-10-CM

## 2016-12-23 DIAGNOSIS — Z794 Long term (current) use of insulin: Secondary | ICD-10-CM

## 2016-12-23 DIAGNOSIS — E1165 Type 2 diabetes mellitus with hyperglycemia: Secondary | ICD-10-CM | POA: Diagnosis not present

## 2016-12-23 LAB — POCT GLYCOSYLATED HEMOGLOBIN (HGB A1C): Hemoglobin A1C: 7.2

## 2016-12-23 MED ORDER — INSULIN LISPRO 100 UNIT/ML ~~LOC~~ SOLN: SUBCUTANEOUS | 3 refills | Status: DC

## 2016-12-23 MED ORDER — FREESTYLE LIBRE READER DEVI: 1.0000 | Freq: Three times a day (TID) | 1 refills | Status: DC

## 2016-12-23 MED ORDER — INSULIN GLARGINE 100 UNIT/ML ~~LOC~~ SOLN: 34.0000 [IU] | Freq: Every day | SUBCUTANEOUS | 3 refills | Status: DC

## 2016-12-23 MED ORDER — FREESTYLE LIBRE SENSOR SYSTEM MISC: 1.0000 | 11 refills | Status: DC

## 2016-12-23 MED FILL — HUMALOG 100 UNITS/ML KWIKPE: 100 | 50 days supply | Qty: 15 | Fill #0

## 2016-12-23 MED FILL — LANTUS SOLOSTAR 100 UNITS/M: 100 | 44 days supply | Qty: 15 | Fill #0

## 2016-12-23 NOTE — Progress Notes (Signed)
Patient ID: Kelsey Vang, female   DOB: [birth year], 41 y.o.   MRN: [id number]   HPI: Kelsey Vang is a 41 y.o.-year-old female, returning for f/u for DM2, dx in 2010, insulin-dependent since 01/2016, uncontrolled, without long term complications. She is the wife of [name], who is also my pt. Last visit 3 mo ago.  Last hemoglobin A1c was: Lab Results  Component Value Date   HGBA1C 8.0% 09/23/2016   HGBA1C 8.1 06/24/2016  02/26/2016: HbA1c 9.2%. Surprisingly, the fructosamine level checked at the same time was 271 (corresponding to the calculated  HbA1c of 6.2%, which, however, does not correlate with the sugars checked at home) 04/07/2015: HbA1c 7.1%  She is getting steroid inj occasionally.  Pt was on a regimen of: - Metformin ER 1000 mg 2x a day (had N/D/AP with regular metformin). - Januvia 100 mg before b'fast (but has a h/o gall stones pancreatitis in 1995 and had flares afterwards, despite cholecystectomy) - VGo 30 >> 40:  - smaller meal 4 >> 6 clicks - larger meal: 6 >> 8 clicks - snack: 1-2 clicks  [name] >> elevated Cr and Invokana >> yeast infection.  However, since last visit she had to come off the VGo as this was constantly coming off: - Metformin ER 1000 mg 2x a day. - Januvia 100 mg before b'fast. - Lantus 30 units at bedtime - Humalog: - snack: 2 units - smaller meal: 6 units - larger meal: 10 units  Pt checks her sugars 4x a day: - am: 150-240 >> 163-215 >> 109-139 >> 98, 117-175 - 2h after b'fast: n/c  - before lunch: n/c >> 107-174, 197 >> 117-139 >> 100-151 - 2h after lunch: n/c >> 123-164 >> n/c - before dinner: n/c >> 109-196 >> 117-147 >> 97, 136-169 - 2h after dinner: n/c >> 127-224 >> 137-163 >> 118-185 - bedtime: 200-220 >> >> 121-202 >> 136-176 >> 106-196, 218, 225 - nighttime: n/c No lows. Lowest sugar was 120 (sick) >> 100 >> 109 >> 97;  she has hypoglycemia awareness at 150.  Highest sugar was 500s (steroid inj), 300s OTW  >> 272 >> 242 >> 225  Glucometer: True Metric >> AccuCheck Guide  Pt's meals are: - Breakfast: instant oatmeal or yoghurt + banana - Lunch: salad + meat  - Dinner: meat + 1-2 veggies, less bread - Snacks: not eating past 7 pm; trail mix  - No CKD, last BUN/creatinine:  Lab Results  Component Value Date   BUN 12 11/10/2016   BUN 12 07/09/2016   CREATININE 0.76 11/10/2016   CREATININE 0.77 07/09/2016  02/26/2016: Glucose 143, BUN/creatinine 22/0.63, EGFR 107 On Lisinopril 5 mg daily. - + HL; last set of lipids: Lab Results  Component Value Date   CHOL 156 06/25/2016   HDL 53 06/25/2016   LDLCALC 61 06/25/2016   TRIG 212 (H) 06/25/2016   CHOLHDL 2.9 06/25/2016   02/26/2016: 160/179/52/71 She is on Lipitor, Zetia. - last eye exam was 06/2016 >> No DR - Denies numbness and tingling in her feet. She was on Lyrica for carpal tunnel in hands and pain in feet from a previous injury. She had to stop this as she could not get it anymore, but has not noticed any neuropathic sxs since she stopped.  ROS: Constitutional: no weight gain/no weight loss, no fatigue, no subjective hyperthermia, no subjective hypothermia, + nocturia Eyes: no blurry vision, no xerophthalmia ENT: no sore throat, no nodules palpated in throat, no dysphagia, no  odynophagia, no hoarseness Cardiovascular: no CP/no SOB/no palpitations/no leg swelling Respiratory: no cough/no SOB/no wheezing Gastrointestinal: no N/no V/no D/no C/+ acid reflux Musculoskeletal: + muscle aches/+ joint aches Skin: no rashes, no hair loss Neurological: no tremors/no numbness/no tingling/no dizziness, + HA  I reviewed pt's medications, allergies, PMH, social hx, family hx, and changes were documented in the history of present illness. Otherwise, unchanged from my initial visit note.   Past Medical History:  Diagnosis Date  . Diabetes mellitus without complication ([place])   . Hyperlipemia    Past Surgical History:  Procedure  Laterality Date  . CHOLECYSTECTOMY    . ENDOMETRIAL ABLATION    . GALLBLADDER SURGERY    . TUBAL LIGATION     Social History   Social History  . Marital status: Married    Spouse name: [name]   . Number of children: 1   Occupational History  . CMA   Social History Main Topics  . Smoking status: Current Every Day Smoker    Packs/day: 1.00    Types: Cigarettes  . Smokeless tobacco: Never Used  . Alcohol use No  . Drug use: No   Current Outpatient Prescriptions on File Prior to Visit  Medication Sig Dispense Refill  . ACCU-CHEK FASTCLIX LANCETS MISC Use to check sugar 2-3 times daily. 300 each 5  . amitriptyline (ELAVIL) 50 MG tablet Take 50 mg by mouth at bedtime.    Marland Kitchen atorvastatin (LIPITOR) 80 MG tablet Take 1 tablet (80 mg total) by mouth daily. 90 tablet 3  . Blood Glucose Monitoring Suppl (ACCU-CHEK AVIVA) device Use as instructed to check sugar daily 1 each 0  . cetirizine (ZYRTEC) 10 MG tablet Take 10 mg by mouth daily.    . Cholecalciferol (VITAMIN D) 2000 units CAPS Take 1 capsule by mouth daily.    . cyclobenzaprine (FLEXERIL) 10 MG tablet Take 1 tablet (10 mg total) by mouth 3 (three) times daily as needed for muscle spasms. 90 tablet 0  . ezetimibe (ZETIA) 10 MG tablet Take 1 tablet (10 mg total) by mouth daily. 90 tablet 3  . fluticasone (FLONASE) 50 MCG/ACT nasal spray Place 1 spray into both nostrils 2 (two) times daily.    Marland Kitchen glucose blood (ACCU-CHEK AVIVA) test strip Use as instructed to check sugar 2-3 times daily 300 each 5  . hydrOXYzine (ATARAX/VISTARIL) 25 MG tablet Take 1-2 tablets (25-50 mg total) by mouth every 6 (six) hours as needed for itching. 60 tablet 0  . insulin glargine (LANTUS) 100 UNIT/ML injection Inject 30 Units into the skin at bedtime.     . insulin lispro (HUMALOG) 100 UNIT/ML injection 6-8 units for large meals, 4-6 for small, and 2-4 for snacks. 30 mL 2  . lisinopril (PRINIVIL,ZESTRIL) 5 MG tablet Take 1 tablet (5 mg total) by  mouth daily. 90 tablet 1  . metFORMIN (GLUCOPHAGE-XR) 500 MG 24 hr tablet Take 2 tablets (1,000 mg total) by mouth 2 (two) times daily with a meal. 360 tablet 3  . montelukast (SINGULAIR) 10 MG tablet Take 1 tablet (10 mg total) by mouth at bedtime. 90 tablet 3  . Omega-3 Fatty Acids (FISH OIL) 1000 MG CAPS Take 1 capsule by mouth 2 (two) times daily.    . pantoprazole (PROTONIX) 40 MG tablet Take 1 tablet (40 mg total) by mouth 2 (two) times daily. 60 tablet 2  . sitaGLIPtin (JANUVIA) 100 MG tablet Take 1 tablet (100 mg total) by mouth daily. 90 tablet 3   No  current facility-administered medications on file prior to visit.    Allergies  Allergen Reactions  . Gabapentin Other (See Comments)  . Levemir [Insulin Detemir] Dermatitis    Whelps at injection site  . Keflex [Cephalexin] Rash   Family History  Problem Relation Age of Onset  . Cancer Mother   . Diabetes Father    PE: BP 110/60   Pulse (!) 102   Ht _0  (1.575 m)   Wt 292 lb 6.4 oz (132.6 kg)   SpO2 95%   BMI 53.48 kg/m  Wt Readings from Last 3 Encounters:  12/23/16 292 lb 6.4 oz (132.6 kg)  11/10/16 290 lb 6.4 oz (131.7 kg)  11/04/16 291 lb (132 kg)   Constitutional: obese, in NAD Eyes: PERRLA, EOMI, no exophthalmos ENT: moist mucous membranes, no thyromegaly, no cervical lymphadenopathy Cardiovascular: tachycardia, RR, No MRG Respiratory: CTA B Gastrointestinal: abdomen soft, NT, ND, BS+ Musculoskeletal: no deformities, strength intact in all 4 Skin: moist, warm, no rashes Neurological: no tremor with outstretched hands, DTR normal in all 4  ASSESSMENT: 1. DM2, insulin-dependent, uncontrolled, without long term complications, but with hypoglycemia - We discussed about GLP-1 receptor agonist, however, we cannot use these due to her history of pancreatitis.  2. HL   PLAN:  1. Patient with long-standing, uncontrolled diabetes, on oral diabetic regimen And previously on VGo 40 patch pump. Since last visit,  however, she needed to come off the pump since it would not stay put. She tells me that she has had this problem from the beginning with the pump. After coming off, she restarted using insulin injections, currently on the basal-bolus insulin regimen, along with Januvia and metformin. She feels that her diabetes control has improved since switching back to this regimen. - At this visit, her sugars are indeed better in the last month, but her sugars in a.m. are higher than target, so I suggested to increase the Lantus to 34 units at bedtime. I did advise her to increase it even further if needed  - we also discussed about the CGM FreeStyle [place], and she is interested in getting this. - I suggested to:  Patient Instructions  Please continue: - Metformin ER 1000 mg 2x a day. - Januvia 100 mg before b'fast. - Humalog: - snack: 2 units - smaller meal: 6 units - larger meal: 10 units  Please increase: - Lantus to 34 units at bedtime  Please return in 3 months with your sugar log.   - today, HbA1c is 7.2% (better) - continue checking sugars at different times of the day - check 3x a day, rotating checks - advised for yearly eye exams >> she is UTD - Return to clinic in 3 mo with sugar log   2. HL - Reviewed latest lipid panel and this shows an LDL at goal and elevated triglycerides. - She continues on Lipitor and ezetimibe.     Philemon Kingdom, MD PhD [facility]

## 2016-12-23 NOTE — Patient Instructions (Addendum)
Please continue: - Metformin ER 1000 mg 2x a day. - Januvia 100 mg before b'fast. - Humalog: - snack: 2 units - smaller meal: 6 units - larger meal: 10 units  Please increase: - Lantus to 34 units at bedtime  Please return in 3 months with your sugar log.

## 2016-12-23 NOTE — Telephone Encounter (Signed)
MEDICATION: PEN NEEDLES  PHARMACY:   [facility] - [place], [place] - [address]. [phone] (Phone) [phone] (Fax)   IS THIS A 90 DAY SUPPLY : YES  IS PATIENT OUT OF MEDICATION: YES

## 2016-12-23 NOTE — Telephone Encounter (Signed)
Routing to you °

## 2016-12-26 ENCOUNTER — Other Ambulatory Visit: Payer: Self-pay

## 2016-12-26 MED ORDER — INSULIN PEN NEEDLE 32G X 4 MM MISC: 5 refills | Status: DC

## 2016-12-26 MED FILL — UNIFINE PENTIPS 32GX5/32: 32G X 4 MM | 90 days supply | Qty: 400 | Fill #0

## 2016-12-26 MED FILL — UNIFINE PENTIPS 32GX5/32": 32G X 4 MM | 90 days supply | Qty: 400 | Fill #0

## 2016-12-26 NOTE — Telephone Encounter (Signed)
Submitted

## 2017-01-04 ENCOUNTER — Ambulatory Visit (INDEPENDENT_AMBULATORY_CARE_PROVIDER_SITE_OTHER): Payer: 59 | Admitting: Family Medicine

## 2017-01-04 ENCOUNTER — Encounter: Payer: Self-pay | Admitting: Family Medicine

## 2017-01-04 VITALS — BP 126/81 | HR 113 | Temp 98.2°F | Resp 18 | Ht 62.0 in | Wt 287.8 lb

## 2017-01-04 DIAGNOSIS — G43109 Migraine with aura, not intractable, without status migrainosus: Secondary | ICD-10-CM | POA: Diagnosis not present

## 2017-01-04 DIAGNOSIS — R000 Tachycardia, unspecified: Secondary | ICD-10-CM | POA: Diagnosis not present

## 2017-01-04 DIAGNOSIS — Z794 Long term (current) use of insulin: Secondary | ICD-10-CM

## 2017-01-04 DIAGNOSIS — R002 Palpitations: Secondary | ICD-10-CM | POA: Diagnosis not present

## 2017-01-04 DIAGNOSIS — E1142 Type 2 diabetes mellitus with diabetic polyneuropathy: Secondary | ICD-10-CM

## 2017-01-04 DIAGNOSIS — IMO0002 Reserved for concepts with insufficient information to code with codable children: Secondary | ICD-10-CM | POA: Insufficient documentation

## 2017-01-04 MED ORDER — KETOROLAC TROMETHAMINE 60 MG/2ML IM SOLN
60.0000 mg | Freq: Once | INTRAMUSCULAR | Status: AC
Start: 2017-01-04 — End: 2017-01-04
  Administered 2017-01-04: 60 mg via INTRAMUSCULAR

## 2017-01-04 MED ORDER — SUMATRIPTAN SUCCINATE 25 MG PO TABS: ORAL_TABLET | ORAL | 3 refills | Status: DC

## 2017-01-04 MED ORDER — AMITRIPTYLINE HCL 100 MG PO TABS: 100.0000 mg | ORAL_TABLET | Freq: Every day | ORAL | 1 refills | Status: DC

## 2017-01-04 MED FILL — AMITRIPTYLINE HCL 100 MG TA: 100 | 90 days supply | Qty: 90 | Fill #0

## 2017-01-04 MED FILL — SUMATRIPTAN SUCC 25 MG TAB: 25 | 20 days supply | Qty: 10 | Fill #0

## 2017-01-04 NOTE — Assessment & Plan Note (Addendum)
Will increase amitriptyline to 100mg , with adding sumatriptan 25mg  for breakthrough. No neuro deficits. Pain improved with toradol

## 2017-01-04 NOTE — Assessment & Plan Note (Signed)
She reports that she has been having improvement with her diabetes with decrease in her a1c. Continue current medications

## 2017-01-04 NOTE — Patient Instructions (Addendum)
     IF you received an x-ray today, you will receive an invoice from [facility]. Please contact [facility] at [phone] with questions or concerns regarding your invoice.   IF you received labwork today, you will receive an invoice from [place]. Please contact [facility] at [phone] with questions or concerns regarding your invoice.   Our billing staff will not be able to assist you with questions regarding bills from these companies.  You will be contacted with the lab results as soon as they are available. The fastest way to get your results is to activate your My Chart account. Instructions are located on the last page of this paperwork. If you have not heard from [place] regarding the results in 2 weeks, please contact this office.    We recommend that you schedule a mammogram for breast cancer screening. Typically, you do not need a referral to do this. Please contact a local imaging center to schedule your mammogram.  [facility] - [phone]  *ask for the Radiology Department [facility]) - [phone] or [phone]  [facility] - [phone] [facility] - [phone] [facility] - [phone]  *ask for the Radiology Department [facility] - [phone]  *ask for the Radiology Department [facility] - [phone]  *ask for the Mammography Department [facility] - [phone] Holter Monitoring A Holter monitor is a small device that is used to detect abnormal heart rhythms. It clips to your clothing and is connected by wires to flat, sticky disks (electrodes) that attach to your chest. It is worn continuously for 24-48 hours. Follow these instructions at home:  Wear your Holter monitor at all times, even while exercising and sleeping, for as long as directed by your health care provider.  Make sure that the Holter monitor is safely  clipped to your clothing or close to your body as recommended by your health care provider.  Do not get the monitor or wires wet.  Do not put body lotion or moisturizer on your chest.  Keep your skin clean.  Keep a diary of your daily activities, such as walking and doing chores. If you feel that your heartbeat is abnormal or that your heart is fluttering or skipping a beat: ? Record what you are doing when it happens. ? Record what time of day the symptoms occur.  Return your Holter monitor as directed by your health care provider.  Keep all follow-up visits as directed by your health care provider. This is important. Get help right away if:  You feel lightheaded or you faint.  You have trouble breathing.  You feel pain in your chest, upper arm, or jaw.  You feel sick to your stomach and your skin is pale, cool, or damp.  You heartbeat feels unusual or abnormal. This information is not intended to replace advice given to you by your health care provider. Make sure you discuss any questions you have with your health care provider. Document Released: 01/01/2004 Document Revised: 09/10/2015 Document Reviewed: 11/11/2013 Elsevier Interactive Patient Education  [employer].

## 2017-01-04 NOTE — Progress Notes (Signed)
Chief Complaint  Patient presents with  . Migraine     x 1 week, nausea/vomiting this morning, yellow/orange starburst to eyes, sensitivity to light and sound.  Taking excedrin migraine, ibuprofen and asa for pain    HPI   Tachycardia Pt reports that she always has a high pulse  She reports hat she also gets palpitations Last episode was yesterday  Migraine She reports that she took amitrripyline for her migraines daily She does not take a break through meds  She states that in the past 2 months she has ben having amitriptyline every week, lasting 4-5 days a week per episode.  She states when she first started amitriptyline it was controlling her headaches She reports that she is now off lyrica as off 2-3 months She was prescribed lyrica for leg pain but was never diagnosed with neuropathy She reports some nausea (she is s/p tubal ligation and endometrial ablation). She took ibuprofen and aspirin as well as excedrin HA this morning  Diabetes mellitus She reports that her diet and medications for diabetes are better Since getting the headaches she gets high sugars when she is in pain She is consistently taking her insulin  She reports that she only had one episode of hypoglycemia last weekend that was 69  Lab Results  Component Value Date   HGBA1C 7.2 12/23/2016      Past Medical History:  Diagnosis Date  . Diabetes mellitus without complication (HCC)   . Hyperlipemia     Current Outpatient Prescriptions  Medication Sig Dispense Refill  . ACCU-CHEK FASTCLIX LANCETS MISC Use to check sugar 2-3 times daily. 300 each 5  . amitriptyline (ELAVIL) 100 MG tablet Take 1 tablet (100 mg total) by mouth at bedtime. 90 tablet 1  . atorvastatin (LIPITOR) 80 MG tablet Take 1 tablet (80 mg total) by mouth daily. 90 tablet 3  . Blood Glucose Monitoring Suppl (ACCU-CHEK AVIVA) device Use as instructed to check sugar daily 1 each 0  . cetirizine (ZYRTEC) 10 MG tablet Take 10 mg by  mouth daily.    . Cholecalciferol (VITAMIN D) 2000 units CAPS Take 1 capsule by mouth daily.    . cyclobenzaprine (FLEXERIL) 10 MG tablet Take 1 tablet (10 mg total) by mouth 3 (three) times daily as needed for muscle spasms. 90 tablet 0  . ezetimibe (ZETIA) 10 MG tablet Take 1 tablet (10 mg total) by mouth daily. 90 tablet 3  . fluticasone (FLONASE) 50 MCG/ACT nasal spray Place 1 spray into both nostrils 2 (two) times daily.    Marland Kitchen glucose blood (ACCU-CHEK AVIVA) test strip Use as instructed to check sugar 2-3 times daily 300 each 5  . hydrOXYzine (ATARAX/VISTARIL) 25 MG tablet Take 1-2 tablets (25-50 mg total) by mouth every 6 (six) hours as needed for itching. 60 tablet 0  . insulin glargine (LANTUS) 100 UNIT/ML injection Inject 0.34 mLs (34 Units total) into the skin at bedtime. Pens please. 45 mL 3  . insulin lispro (HUMALOG) 100 UNIT/ML injection Inject under skin 6-10 units for meal. Pens. 45 mL 3  . Insulin Pen Needle 32G X 4 MM MISC Use to inject insulin 4 times daily 400 each 5  . lisinopril (PRINIVIL,ZESTRIL) 5 MG tablet Take 1 tablet (5 mg total) by mouth daily. 90 tablet 1  . metFORMIN (GLUCOPHAGE-XR) 500 MG 24 hr tablet Take 2 tablets (1,000 mg total) by mouth 2 (two) times daily with a meal. 360 tablet 3  . montelukast (SINGULAIR) 10 MG tablet Take  1 tablet (10 mg total) by mouth at bedtime. 90 tablet 3  . Omega-3 Fatty Acids (FISH OIL) 1000 MG CAPS Take 1 capsule by mouth 2 (two) times daily.    . pantoprazole (PROTONIX) 40 MG tablet Take 1 tablet (40 mg total) by mouth 2 (two) times daily. 60 tablet 2  . sitaGLIPtin (JANUVIA) 100 MG tablet Take 1 tablet (100 mg total) by mouth daily. 90 tablet 3  . Continuous Blood Gluc Receiver (FREESTYLE LIBRE READER) DEVI 1 Device by Does not apply route 3 (three) times daily. (Patient not taking: Reported on 01/04/2017) 1 Device 1  . Continuous Blood Gluc Sensor (FREESTYLE LIBRE SENSOR SYSTEM) MISC 1 Device by Does not apply route every 30 (thirty)  days. (Patient not taking: Reported on 01/04/2017) 3 each 11  . SUMAtriptan (IMITREX) 25 MG tablet Take at the first sign of migraine. May repeat in 2 hours if headache persists or recurs. No more than 2 doses in a 24hr period. 10 tablet 3   No current facility-administered medications for this visit.     Allergies:  Allergies  Allergen Reactions  . Gabapentin Other (See Comments)  . Levemir [Insulin Detemir] Dermatitis    Whelps at injection site  . Keflex [Cephalexin] Rash    Past Surgical History:  Procedure Laterality Date  . CHOLECYSTECTOMY    . ENDOMETRIAL ABLATION    . GALLBLADDER SURGERY    . TUBAL LIGATION      Social History   Social History  . Marital status: Married    Spouse name: N/A  . Number of children: N/A  . Years of education: N/A   Social History Main Topics  . Smoking status: Current Every Day Smoker    Packs/day: 0.50    Years: 20.00    Types: Cigarettes  . Smokeless tobacco: Never Used  . Alcohol use No  . Drug use: No  . Sexual activity: No   Other Topics Concern  . None   Social History Narrative  . None    ROS  Objective: Vitals:   01/04/17 1310 01/04/17 1409  BP: 126/81   Pulse: (!) 115 (!) 113  Resp: 18   Temp: 98.2 F (36.8 C)   TempSrc: Oral   SpO2: 95% 94%  Weight: 287 lb 12.8 oz (130.5 kg)   Height:  (1.575 m)     Physical Exam  Constitutional: She is oriented to person, place, and time. She appears well-developed and well-nourished.  HENT:  Head: Normocephalic and atraumatic.  Right Ear: External ear normal.  Left Ear: External ear normal.  Nose: Nose normal.  Mouth/Throat: Oropharynx is clear and moist. No oropharyngeal exudate.  Eyes: Conjunctivae and EOM are normal.  Cardiovascular: Normal rate, regular rhythm and normal heart sounds.   Pulmonary/Chest: Effort normal and breath sounds normal. No respiratory distress. She has no wheezes.  Neurological: She is alert and oriented to person, place, and  time.  Skin: Skin is warm. Capillary refill takes less than 2 seconds.  Psychiatric: She has a normal mood and affect. Her behavior is normal. Judgment and thought content normal.   CRANIAL NERVES: CN II: Visual fields are full to confrontation. Fundoscopic exam is normal with sharp discs and no vascular changes. Pupils are round equal and briskly reactive to light. CN III, IV, VI: extraocular movement are normal. No ptosis. CN V: Facial sensation is intact to pinprick in all 3 divisions bilaterally. Corneal responses are intact.  CN VII: Face is symmetric with normal  eye closure and smile. CN VIII: Hearing is normal to rubbing fingers CN IX, X: Palate elevates symmetrically. Phonation is normal. CN XI: Head turning and shoulder shrug are intact CN XII: Tongue is midline with normal movements and no atrophy.  Assessment and Plan Avery was seen today for migraine.  Diagnoses and all orders for this visit:  Migraine with aura and without status migrainosus, not intractable- advised restarting elavil with imitrex -     ketorolac (TORADOL) injection 60 mg; Inject 2 mLs (60 mg total) into the muscle once.  Type 2 diabetes mellitus with diabetic polyneuropathy, with long-term current use of insulin (HCC) - DM close to goal, cpm Lab Results  Component Value Date   HGBA1C 7.2 12/23/2016    Tachycardia- likely due to pain, advised hydration Palpitations- will refer for holter monitoring -     Holter monitor - 48 hours  Other orders -     amitriptyline (ELAVIL) 100 MG tablet; Take 1 tablet (100 mg total) by mouth at bedtime. -     SUMAtriptan (IMITREX) 25 MG tablet; Take at the first sign of migraine. May repeat in 2 hours if headache persists or recurs. No more than 2 doses in a 24hr period.     Rya Rausch A Bona Hubbard

## 2017-01-04 NOTE — Assessment & Plan Note (Signed)
With tachycardia and palpitations will refer to Cardiology

## 2017-01-05 ENCOUNTER — Encounter: Payer: Self-pay | Admitting: Family Medicine

## 2017-01-05 ENCOUNTER — Telehealth: Payer: Self-pay | Admitting: Family Medicine

## 2017-01-05 NOTE — Telephone Encounter (Signed)
Pt is wanting to let dr Creta Levin know she has been up all night sick from the migraine and would like to know if something else can be called in the zofran is not working  And to extend her work note until Monday   Best number [phone]

## 2017-01-06 NOTE — Telephone Encounter (Signed)
Please advise 

## 2017-01-20 MED FILL — ATORVASTATIN 80 MG TABLET: 80 | 90 days supply | Qty: 90 | Fill #2

## 2017-01-20 MED FILL — EZETIMIBE 10 MG TAB: 10 | 90 days supply | Qty: 90 | Fill #2

## 2017-02-01 ENCOUNTER — Telehealth: Payer: 59 | Admitting: Family

## 2017-02-01 DIAGNOSIS — R399 Unspecified symptoms and signs involving the genitourinary system: Secondary | ICD-10-CM | POA: Diagnosis not present

## 2017-02-01 MED ORDER — NITROFURANTOIN MONOHYD MACRO 100 MG PO CAPS: 100.0000 mg | ORAL_CAPSULE | Freq: Two times a day (BID) | ORAL | 0 refills | Status: DC

## 2017-02-01 MED FILL — NITROFURANTOIN MONO-MCR 100: 100 | 7 days supply | Qty: 14 | Fill #0

## 2017-02-01 NOTE — Progress Notes (Signed)

## 2017-02-07 DIAGNOSIS — G4733 Obstructive sleep apnea (adult) (pediatric): Secondary | ICD-10-CM | POA: Diagnosis not present

## 2017-02-14 ENCOUNTER — Ambulatory Visit: Payer: Self-pay

## 2017-02-14 MED FILL — LANTUS SOLOSTAR 100 UNITS/M: 100 | 44 days supply | Qty: 15 | Fill #1

## 2017-02-14 MED FILL — HUMALOG 100 UNITS/ML KWIKPE: 100 | 50 days supply | Qty: 15 | Fill #1

## 2017-02-18 ENCOUNTER — Encounter: Payer: Self-pay | Admitting: Physician Assistant

## 2017-02-18 ENCOUNTER — Ambulatory Visit (INDEPENDENT_AMBULATORY_CARE_PROVIDER_SITE_OTHER): Payer: 59 | Admitting: Physician Assistant

## 2017-02-18 VITALS — BP 110/70 | HR 99 | Temp 98.3°F | Resp 18 | Ht 61.69 in | Wt 293.2 lb

## 2017-02-18 DIAGNOSIS — B379 Candidiasis, unspecified: Secondary | ICD-10-CM

## 2017-02-18 DIAGNOSIS — J0100 Acute maxillary sinusitis, unspecified: Secondary | ICD-10-CM | POA: Diagnosis not present

## 2017-02-18 DIAGNOSIS — T3695XA Adverse effect of unspecified systemic antibiotic, initial encounter: Secondary | ICD-10-CM

## 2017-02-18 DIAGNOSIS — R05 Cough: Secondary | ICD-10-CM | POA: Diagnosis not present

## 2017-02-18 DIAGNOSIS — R059 Cough, unspecified: Secondary | ICD-10-CM

## 2017-02-18 MED ORDER — FLUCONAZOLE 150 MG PO TABS: ORAL_TABLET | ORAL | 0 refills | Status: DC

## 2017-02-18 MED ORDER — BENZONATATE 100 MG PO CAPS: 100.0000 mg | ORAL_CAPSULE | Freq: Three times a day (TID) | ORAL | 0 refills | Status: DC | PRN

## 2017-02-18 MED ORDER — DOXYCYCLINE HYCLATE 100 MG PO CAPS: 100.0000 mg | ORAL_CAPSULE | Freq: Two times a day (BID) | ORAL | 0 refills | Status: DC

## 2017-02-18 NOTE — Progress Notes (Signed)
MRN: [id number] DOB: 01-20-1976  Subjective:   Kelsey Vang is a 41 y.o. female presenting for chief complaint of Sinus Problem (X 2 days- left eye swelling) . Patient reports that she has been dealing with upper respiratory infection with a nonproductive cough times 2 weeks.  The nasal congestion, ear pain, and sore throat have all improved.  She was left with a dry hacking cough.  However, 2 days ago she developed worsening sinus pressure in her left maxillary sinus.  She can feel the drainage from her left side shift over to her right side when she changes positions.  She has had some overlying facial swelling and left ear fullness.  Denies fever, chills, diaphoresis, hemoptysis, wheezing, shortness of breath, visual disturbance, headache, dizziness, confusion, and myalgias.  Has tried daily Claritin, decongestant, Mucinex, and nasal saline rinses consistently.  Has had sick contact with all of her family members..  Has history of seasonal allergies, takes daily Claritin and Flonase as needed.Has hx of sinus infections.  Her last sinus infection was so bad it required 2 weeks worth of antibiotics.  No hx of asthma or COPD.  Patient has had flu shot this season. Current smoker, smokes 1ppd x 7 years. Denies any other aggravating or relieving factors, no other questions or concerns.  Kelsey Vang has a current medication list which includes the following prescription(s): accu-chek fastclix lancets, amitriptyline, atorvastatin, accu-chek aviva, cetirizine, vitamin d, freestyle libre sensor system, cyclobenzaprine, ezetimibe, fluticasone, glucose blood, insulin glargine, insulin lispro, insulin pen needle, lisinopril, metformin, montelukast, fish oil, pantoprazole, sitagliptin, sumatriptan, freestyle libre reader, hydroxyzine, and nitrofurantoin (macrocrystal-monohydrate). Also is allergic to gabapentin; levemir [insulin detemir]; and keflex [cephalexin].  Kelsey Vang  has a past medical history of Diabetes  mellitus without complication (HCC) and Hyperlipemia. Also  has a past surgical history that includes Gallbladder surgery; Tubal ligation; Endometrial ablation; and Cholecystectomy.   Objective:   Vitals: BP 110/70 (BP Location: Left Arm, Patient Position: Sitting, Cuff Size: Large)   Pulse 99   Temp 98.3 F (36.8 C) (Oral)   Resp 18   Ht 5' 1.69" (1.567 m)   Wt 293 lb 3.2 oz (133 kg)   LMP 02/10/2017 (Approximate)   SpO2 95%   BMI 54.16 kg/m   Physical Exam  Constitutional: She is oriented to person, place, and time. She appears well-developed and well-nourished. She appears distressed ( Appears uncomfortable sitting on exam table).  HENT:  Head: Normocephalic and atraumatic. Head is without right periorbital erythema and without left periorbital erythema.    Right Ear: Tympanic membrane, external ear and ear canal normal.  Left Ear: External ear and ear canal normal. Tympanic membrane is not erythematous and not bulging. A middle ear effusion is present.  Nose: Septal deviation present. Right sinus exhibits no maxillary sinus tenderness and no frontal sinus tenderness. Left sinus exhibits maxillary sinus tenderness (Exquisite tenderness with palpation and with leaning head forward). Left sinus exhibits no frontal sinus tenderness.  Mouth/Throat: Uvula is midline, oropharynx is clear and moist and mucous membranes are normal. No tonsillar exudate.  Eyes: Pupils are equal, round, and reactive to light. Conjunctivae and EOM are normal.  No periorbital swelling. No pain with palpation of bilateral orbits.  Neck: Normal range of motion.  Cardiovascular: Normal rate, regular rhythm and normal heart sounds.   Pulmonary/Chest: Effort normal. She has no wheezes. She has no rhonchi. She has no rales.  Lymphadenopathy:       Head (right side): No submental, no submandibular,  no tonsillar, no preauricular, no posterior auricular and no occipital adenopathy present.       Head (left side): No  submental, no submandibular, no tonsillar, no preauricular, no posterior auricular and no occipital adenopathy present.    She has no cervical adenopathy.       Right: No supraclavicular adenopathy present.       Left: No supraclavicular adenopathy present.  Neurological: She is alert and oriented to person, place, and time. No cranial nerve deficit.  Skin: Skin is warm and dry.  Psychiatric: She has a normal mood and affect.  Vitals reviewed.  No results found for this or any previous visit (from the past 24 hour(s)).  Assessment and Plan :  1. Acute non-recurrent maxillary sinusitis Due to history and no full improvement despite conservative treatment, concern for secondary sickening.  Physical exam findings consistent with left maxillary sinusitis.  Cranial nerves intact.  Will treat at this time with oral doxycycline as this will cover both sinus and atypical lung bacterial etiologies.  Patient also encouraged to continue daily antihistamine and nasal saline rinses.  Encouraged to use warm mist humidifier at home and cool or warm compress on the face.  May also use over-the-counter ibuprofen as prescribed as needed for pain and swelling. Return to clinic if symptoms worsen, do not improve, or as needed. - doxycycline (VIBRAMYCIN) 100 MG capsule; Take 1 capsule (100 mg total) by mouth 2 (two) times daily.  Dispense: 20 capsule; Refill: 0 - Care order/instruction: 2. Cough - benzonatate (TESSALON) 100 MG capsule; Take 1-2 capsules (100-200 mg total) by mouth 3 (three) times daily as needed for cough.  Dispense: 40 capsule; Refill: 0 3. Antibiotic-induced yeast infection - fluconazole (DIFLUCAN) 150 MG tablet; Take 1 tablet midway through your antibiotic course.  Take the additional tablet after you complete your antibiotic course.  Dispense: 2 tablet; Refill: 0  Benjiman Core, PA-C  [facility] at [facility] 02/18/2017 9:51 AM

## 2017-02-18 NOTE — Patient Instructions (Addendum)
You likely had a viral infection that has now progressed into a bacterial infection.  I recommend treating your sinus infection with doxycycline.  Please take as prescribed.  You may also use over-the-counter ibuprofen as prescribed for the swelling.  I also recommend he use warm mist humidifier at home continue doing nasal saline rinses as you are.  Continue daily Claritin.  It may also feel good to put a warm or cool compress on your face and the affected area.  I have given you a prescription for 2 Diflucan to use to avoid yeast infections.  Please take 1 midway through antibiotic course and take the other after you complete the antibiotic course.  For the cough, I have given you a prescription for Tessalon Perles to use during the day.  The doxycycline will cover any bacterial etiology of the lungs as well.  You should start to feel better in 24 to 48 hours.  However your cough could linger up to 1-2 more weeks. Return to clinic if symptoms worsen, do not improve, or as needed.     While taking Doxycycline:  -Do not drink milk or take iron supplements, multivitamins, calcium supplements, antacids, laxatives within 2 hours before or after taking doxycycline. -Avoid direct exposure to sunlight or tanning beds. Doxycycline can make you sunburn more easily. Wear protective clothing and use sunscreen (SPF 30 or higher) when you are outdoors. -Antibiotic medicines can cause diarrhea, which may be a sign of a new infection. If you have diarrhea that is watery or bloody, stop taking this medicine and seek medical care.   Thank you for letting me participate in your health and well being.   Sinusitis, Adult Sinusitis is soreness and inflammation of your sinuses. Sinuses are hollow spaces in the bones around your face. They are located:  Around your eyes.  In the middle of your forehead.  Behind your nose.  In your cheekbones.  Your sinuses and nasal passages are lined with a stringy fluid (mucus).  Mucus normally drains out of your sinuses. When your nasal tissues get inflamed or swollen, the mucus can get trapped or blocked so air cannot flow through your sinuses. This lets bacteria, viruses, and funguses grow, and that leads to infection. Follow these instructions at home: Medicines  Take, use, or apply over-the-counter and prescription medicines only as told by your doctor. These may include nasal sprays.  If you were prescribed an antibiotic medicine, take it as told by your doctor. Do not stop taking the antibiotic even if you start to feel better. Hydrate and Humidify  Drink enough water to keep your pee (urine) clear or pale yellow.  Use a cool mist humidifier to keep the humidity level in your home above 50%.  Breathe in steam for 10-15 minutes, 3-4 times a day or as told by your doctor. You can do this in the bathroom while a hot shower is running.  Try not to spend time in cool or dry air. Rest  Rest as much as possible.  Sleep with your head raised (elevated).  Make sure to get enough sleep each night. General instructions  Put a warm, moist washcloth on your face 3-4 times a day or as told by your doctor. This will help with discomfort.  Wash your hands often with soap and water. If there is no soap and water, use hand sanitizer.  Do not smoke. Avoid being around people who are smoking (secondhand smoke).  Keep all follow-up visits as told  by your doctor. This is important. Contact a doctor if:  You have a fever.  Your symptoms get worse.  Your symptoms do not get better within 10 days. Get help right away if:  You have a very bad headache.  You cannot stop throwing up (vomiting).  You have pain or swelling around your face or eyes.  You have trouble seeing.  You feel confused.  Your neck is stiff.  You have trouble breathing. This information is not intended to replace advice given to you by your health care provider. Make sure you discuss any  questions you have with your health care provider. Document Released: 09/21/2007 Document Revised: 11/29/2015 Document Reviewed: 01/28/2015 Elsevier Interactive Patient Education  2018 [employer] Inc.  Cough, Adult A cough helps to clear your throat and lungs. A cough may last only 2-3 weeks (acute), or it may last longer than 8 weeks (chronic). Many different things can cause a cough. A cough may be a sign of an illness or another medical condition. Follow these instructions at home:  Pay attention to any changes in your cough.  Take medicines only as told by your doctor. ? If you were prescribed an antibiotic medicine, take it as told by your doctor. Do not stop taking it even if you start to feel better. ? Talk with your doctor before you try using a cough medicine.  Drink enough fluid to keep your pee (urine) clear or pale yellow.  If the air is dry, use a cold steam vaporizer or humidifier in your home.  Stay away from things that make you cough at work or at home.  If your cough is worse at night, try using extra pillows to raise your head up higher while you sleep.  Do not smoke, and try not to be around smoke. If you need help quitting, ask your doctor.  Do not have caffeine.  Do not drink alcohol.  Rest as needed. Contact a doctor if:  You have new problems (symptoms).  You cough up yellow fluid (pus).  Your cough does not get better after 2-3 weeks, or your cough gets worse.  Medicine does not help your cough and you are not sleeping well.  You have pain that gets worse or pain that is not helped with medicine.  You have a fever.  You are losing weight and you do not know why.  You have night sweats. Get help right away if:  You cough up blood.  You have trouble breathing.  Your heartbeat is very fast. This information is not intended to replace advice given to you by your health care provider. Make sure you discuss any questions you have with your health  care provider. Document Released: 12/16/2010 Document Revised: 09/10/2015 Document Reviewed: 06/11/2014 Elsevier Interactive Patient Education  2018 [employer]   IF you received an x-ray today, you will receive an invoice from [facility]. Please contact [facility] at [phone] with questions or concerns regarding your invoice.   IF you received labwork today, you will receive an invoice from [facility]. Please contact [facility] at [phone] with questions or concerns regarding your invoice.   Our billing staff will not be able to assist you with questions regarding bills from these companies.  You will be contacted with the lab results as soon as they are available. The fastest way to get your results is to activate your My Chart account. Instructions are located on the last page of this paperwork. If you have not  heard from [place] regarding the results in 2 weeks, please contact this office.

## 2017-02-18 NOTE — Progress Notes (Deleted)
Shalonda Langen  MRN: [id number] DOB: 07/02/1975  Subjective:  Jahaira Nutley is a 41 y.o. female seen in office today for a chief complaint of ***  Review of Systems  Patient Active Problem List   Diagnosis Date Noted  . Tachycardia 01/04/2017  . Migraine with aura and without status migrainosus, not intractable 01/04/2017  . Palpitations 01/04/2017  . Obstructive apnea 04/23/2015  . Type 2 diabetes mellitus with diabetic polyneuropathy, with long-term current use of insulin (HCC) 12/27/2013  . Current tobacco use 12/27/2013  . Morbid (severe) obesity due to excess calories (HCC) 12/27/2013  . HLD (hyperlipidemia) 12/27/2013    Current Outpatient Prescriptions on File Prior to Visit  Medication Sig Dispense Refill  . ACCU-CHEK FASTCLIX LANCETS MISC Use to check sugar 2-3 times daily. 300 each 5  . amitriptyline (ELAVIL) 100 MG tablet Take 1 tablet (100 mg total) by mouth at bedtime. 90 tablet 1  . atorvastatin (LIPITOR) 80 MG tablet Take 1 tablet (80 mg total) by mouth daily. 90 tablet 3  . Blood Glucose Monitoring Suppl (ACCU-CHEK AVIVA) device Use as instructed to check sugar daily 1 each 0  . cetirizine (ZYRTEC) 10 MG tablet Take 10 mg by mouth daily.    . Cholecalciferol (VITAMIN D) 2000 units CAPS Take 1 capsule by mouth daily.    . Continuous Blood Gluc Sensor (FREESTYLE LIBRE SENSOR SYSTEM) MISC 1 Device by Does not apply route every 30 (thirty) days. 3 each 11  . cyclobenzaprine (FLEXERIL) 10 MG tablet Take 1 tablet (10 mg total) by mouth 3 (three) times daily as needed for muscle spasms. 90 tablet 0  . ezetimibe (ZETIA) 10 MG tablet Take 1 tablet (10 mg total) by mouth daily. 90 tablet 3  . fluticasone (FLONASE) 50 MCG/ACT nasal spray Place 1 spray into both nostrils 2 (two) times daily.    Marland Kitchen glucose blood (ACCU-CHEK AVIVA) test strip Use as instructed to check sugar 2-3 times daily 300 each 5  . insulin glargine (LANTUS) 100 UNIT/ML injection Inject 0.34 mLs (34 Units  total) into the skin at bedtime. Pens please. 45 mL 3  . insulin lispro (HUMALOG) 100 UNIT/ML injection Inject under skin 6-10 units for meal. Pens. 45 mL 3  . Insulin Pen Needle 32G X 4 MM MISC Use to inject insulin 4 times daily 400 each 5  . lisinopril (PRINIVIL,ZESTRIL) 5 MG tablet Take 1 tablet (5 mg total) by mouth daily. 90 tablet 1  . metFORMIN (GLUCOPHAGE-XR) 500 MG 24 hr tablet Take 2 tablets (1,000 mg total) by mouth 2 (two) times daily with a meal. 360 tablet 3  . montelukast (SINGULAIR) 10 MG tablet Take 1 tablet (10 mg total) by mouth at bedtime. 90 tablet 3  . Omega-3 Fatty Acids (FISH OIL) 1000 MG CAPS Take 1 capsule by mouth 2 (two) times daily.    . pantoprazole (PROTONIX) 40 MG tablet Take 1 tablet (40 mg total) by mouth 2 (two) times daily. 60 tablet 2  . sitaGLIPtin (JANUVIA) 100 MG tablet Take 1 tablet (100 mg total) by mouth daily. 90 tablet 3  . SUMAtriptan (IMITREX) 25 MG tablet Take at the first sign of migraine. May repeat in 2 hours if headache persists or recurs. No more than 2 doses in a 24hr period. 10 tablet 3  . Continuous Blood Gluc Receiver (FREESTYLE LIBRE READER) DEVI 1 Device by Does not apply route 3 (three) times daily. (Patient not taking: Reported on 01/04/2017) 1 Device 1  .  hydrOXYzine (ATARAX/VISTARIL) 25 MG tablet Take 1-2 tablets (25-50 mg total) by mouth every 6 (six) hours as needed for itching. (Patient not taking: Reported on 02/18/2017) 60 tablet 0  . nitrofurantoin, macrocrystal-monohydrate, (MACROBID) 100 MG capsule Take 1 capsule (100 mg total) by mouth 2 (two) times daily. 1 po BId (Patient not taking: Reported on 02/18/2017) 14 capsule 0   No current facility-administered medications on file prior to visit.     Allergies  Allergen Reactions  . Gabapentin Other (See Comments)  . Levemir [Insulin Detemir] Dermatitis    Whelps at injection site  . Keflex [Cephalexin] Rash     Objective:  There were no vitals taken for this  visit.  Physical Exam  Assessment and Plan :  *** There are no diagnoses linked to this encounter.   Benjiman CoreBrittany Wiseman PA-C  [facility] at [facility]  [facility] 02/18/2017 9:06 AM

## 2017-02-27 ENCOUNTER — Other Ambulatory Visit: Payer: Self-pay

## 2017-02-27 NOTE — Patient Outreach (Signed)
[facility]) Care Management  02/27/2017  Kelsey Vang 1975/05/07 [id number]   Telephone call to inform of closing of Link to Wellness case and transition to [employer].  Instructed that she will be transitioned to [employer] in 2019 for disease management and the Link to Wellness program will be closed. Instructed that she will be contacted by [employer] by phone in January 2019.   Instructed that she will continue to receive the pharmacy benefit.    [employer] will contact member in January to continue diabetes disease management. Case closed for Link to Wellness as member will be enrolled in an external program. Member and provider to be sent letter on transition to [employer]  Dudley Major RN, [facility] Care Management Coordinator-Link to Wellness [facility] Care Management [phone]

## 2017-03-03 ENCOUNTER — Ambulatory Visit: Payer: 59 | Admitting: Cardiovascular Disease

## 2017-03-03 ENCOUNTER — Encounter: Payer: Self-pay | Admitting: Cardiovascular Disease

## 2017-03-03 VITALS — BP 112/72 | HR 106 | Ht 62.0 in | Wt 296.0 lb

## 2017-03-03 DIAGNOSIS — R002 Palpitations: Secondary | ICD-10-CM

## 2017-03-03 DIAGNOSIS — E7800 Pure hypercholesterolemia, unspecified: Secondary | ICD-10-CM | POA: Diagnosis not present

## 2017-03-03 NOTE — Progress Notes (Signed)
Cardiology Office Note   Date:  03/03/2017   ID:  [name], DOB [birth year], MRN [id number]  PCP:  Doristine Bosworth, MD  Cardiologist:   Chilton Si, MD   Chief Complaint  Patient presents with  . New Patient (Initial Visit)    elevated heart rate at rest , has gottten worse      History of Present Illness: Kelsey Vang is a 41 y.o. female with hyperlipidemia, diabetes, morbid obesity, tobacco abuse, and OSA who is being seen today for the evaluation of tachycardia at the request of Doristine Bosworth, MD.  Kelsey Vang saw Dr. Creta Levin on 12/2016 and reported a fast resting heart rate and palpitations.  At that appointment her heart rate was noted to be 115 bpm.  She was referred for a 48-hour Holter monitor that has not yet been completed.  She has noted that her heart rate is fast for over a year now.  She works as a Clinical biochemist and when she checks her heart rate it is always over 100 bpm.  She does not typically feel it, but she has experienced 2 or 3 episodes of the last year where she felt like her heart was racing.  The most recent episode occurred last week and awaken her from sleep.  She is unsure whether or not she may have been having a nightmare.  Typically her heart rate is in the 100s-110s but has gotten as high as 130.  She notices that it is higher when she is in a physician's office.  She denies lightheadedness or dizziness.  She also has not experienced any chest pain or shortness of breath.  When her heart races it does make her feel anxious.  She has not experienced any lower extremity edema, orthopnea, or PND.  She wears her CPAP regularly.  Kelsey Vang started taking amitriptyline one year ago.  That is around the time when her heart rate started increasing.  She doesn't drink much caffeine.  He uses Mucinex D 1-2 times per month. She has been smoking 1 pack of cigarettes daily for 20 years.  She was able to quit 11 years ago while pregnant.  Since then she has been  participating in cessation counseling through work.  She has tried gum and patches without success.  She plans to discuss Chantix with her PCP tomorrow.   Past Medical History:  Diagnosis Date  . Diabetes mellitus without complication (HCC)   . Hyperlipemia     Past Surgical History:  Procedure Laterality Date  . CHOLECYSTECTOMY    . ENDOMETRIAL ABLATION    . GALLBLADDER SURGERY    . TUBAL LIGATION       Current Outpatient Medications  Medication Sig Dispense Refill  . ACCU-CHEK FASTCLIX LANCETS MISC Use to check sugar 2-3 times daily. 300 each 5  . amitriptyline (ELAVIL) 100 MG tablet Take 1 tablet (100 mg total) by mouth at bedtime. 90 tablet 1  . atorvastatin (LIPITOR) 80 MG tablet Take 1 tablet (80 mg total) by mouth daily. 90 tablet 3  . benzonatate (TESSALON) 100 MG capsule Take 1-2 capsules (100-200 mg total) by mouth 3 (three) times daily as needed for cough. 40 capsule 0  . Blood Glucose Monitoring Suppl (ACCU-CHEK AVIVA) device Use as instructed to check sugar daily 1 each 0  . cetirizine (ZYRTEC) 10 MG tablet Take 10 mg by mouth daily.    . Cholecalciferol (VITAMIN D) 2000 units CAPS Take 1 capsule by mouth  daily.    . Continuous Blood Gluc Receiver (FREESTYLE LIBRE READER) DEVI 1 Device by Does not apply route 3 (three) times daily. 1 Device 1  . cyclobenzaprine (FLEXERIL) 10 MG tablet Take 1 tablet (10 mg total) by mouth 3 (three) times daily as needed for muscle spasms. 90 tablet 0  . ezetimibe (ZETIA) 10 MG tablet Take 1 tablet (10 mg total) by mouth daily. 90 tablet 3  . fluticasone (FLONASE) 50 MCG/ACT nasal spray Place 1 spray into both nostrils 2 (two) times daily.    Marland Kitchen. glucose blood (ACCU-CHEK AVIVA) test strip Use as instructed to check sugar 2-3 times daily 300 each 5  . insulin glargine (LANTUS) 100 UNIT/ML injection Inject 0.34 mLs (34 Units total) into the skin at bedtime. Pens please. 45 mL 3  . insulin lispro (HUMALOG) 100 UNIT/ML injection Inject under  skin 6-10 units for meal. Pens. 45 mL 3  . Insulin Pen Needle 32G X 4 MM MISC Use to inject insulin 4 times daily 400 each 5  . lisinopril (PRINIVIL,ZESTRIL) 5 MG tablet Take 1 tablet (5 mg total) by mouth daily. 90 tablet 1  . metFORMIN (GLUCOPHAGE-XR) 500 MG 24 hr tablet Take 2 tablets (1,000 mg total) by mouth 2 (two) times daily with a meal. 360 tablet 3  . montelukast (SINGULAIR) 10 MG tablet Take 1 tablet (10 mg total) by mouth at bedtime. 90 tablet 3  . Omega-3 Fatty Acids (FISH OIL) 1000 MG CAPS Take 1 capsule by mouth 2 (two) times daily.    . pantoprazole (PROTONIX) 40 MG tablet Take 1 tablet (40 mg total) by mouth 2 (two) times daily. 60 tablet 2  . sitaGLIPtin (JANUVIA) 100 MG tablet Take 1 tablet (100 mg total) by mouth daily. 90 tablet 3  . SUMAtriptan (IMITREX) 25 MG tablet Take at the first sign of migraine. May repeat in 2 hours if headache persists or recurs. No more than 2 doses in a 24hr period. 10 tablet 3   No current facility-administered medications for this visit.     Allergies:   Gabapentin; Levemir [insulin detemir]; and Keflex [cephalexin]    Social History:  The patient  reports that she has been smoking cigarettes.  She has a 10.00 pack-year smoking history. she has never used smokeless tobacco. She reports that she does not drink alcohol or use drugs.   Family History:  The patient's family history includes Cancer in her mother and paternal grandfather; Diabetes in her father; Heart attack in her maternal grandmother; Liver disease in her father.    ROS:  Please see the history of present illness.   Otherwise, review of systems are positive for none.   All other systems are reviewed and negative.    PHYSICAL EXAM: VS:  BP 112/72 (BP Location: Right Arm, Cuff Size: Large)   Pulse (!) 106   Ht 5\' 2"  (1.575 m)   Wt 134.3 kg (296 lb)   LMP 02/10/2017 (Approximate)   BMI 54.14 kg/m  , BMI Body mass index is 54.14 kg/m. GENERAL:  Well appearing HEENT:   Pupils equal round and reactive, fundi not visualized, oral mucosa unremarkable NECK:  No jugular venous distention, waveform within normal limits, carotid upstroke brisk and symmetric, no bruits, no thyromegaly LYMPHATICS:  No cervical adenopathy LUNGS:  Clear to auscultation bilaterally.  No crackles, wheezes or rhonchi.  HEART:  RRR.  PMI not displaced or sustained,S1 and S2 within normal limits, no S3, no S4, no clicks, no rubs, no  murmurs ABD:  Flat, positive bowel sounds normal in frequency in pitch, no bruits, no rebound, no guarding, no midline pulsatile mass, no hepatomegaly, no splenomegaly EXT:  2 plus pulses throughout, no edema, no cyanosis no clubbing SKIN:  No rashes no nodules NEURO:  Cranial nerves II through XII grossly intact, motor grossly intact throughout PSYCH:  Cognitively intact, oriented to person place and time   EKG:  EKG is ordered today. The ekg ordered today demonstrates sinus tachycardia.  Rate 106 bpm.   Recent Labs: 06/25/2016: ALT 39; TSH 2.630 11/10/2016: BUN 12; Creatinine, Ser 0.76; Hemoglobin WILL FOLLOW; Platelets WILL FOLLOW; Potassium 5.3; Sodium 138    Lipid Panel    Component Value Date/Time   CHOL 156 06/25/2016 0916   TRIG 212 (H) 06/25/2016 0916   HDL 53 06/25/2016 0916   CHOLHDL 2.9 06/25/2016 0916   LDLCALC 61 06/25/2016 0916      Wt Readings from Last 3 Encounters:  03/03/17 134.3 kg (296 lb)  02/18/17 133 kg (293 lb 3.2 oz)  01/04/17 130.5 kg (287 lb 12.8 oz)      ASSESSMENT AND PLAN:  # Sinus tachycardia: # Palpitations: Resting heart rate is consistently above 100 bpm.  She does not appear anxious and does not have a high caffeine intake.  She also has no sign of infection and is not in pain.  The only new medication is amitriptyline which has a known possible side effect of tachycardia and palpitations.  She is seeing her primary care provider tomorrow we will discuss holding amitriptyline.  We will also get an  echocardiogram to assess for structural heart disease.  If all the above are normal consider a trial of metoprolol.  # Hyperlipidemia: Continue atorvastatin and ezetimibe.  LDL 61 06/2016  Current medicines are reviewed at length with the patient today.  The patient does not have concerns regarding medicines.  The following changes have been made:  no change  Labs/ tests ordered today include:   Orders Placed This Encounter  Procedures  . CBC with Differential/Platelet  . T4, free  . TSH  . Comprehensive metabolic panel  . Magnesium  . EKG 12-Lead  . ECHOCARDIOGRAM COMPLETE     Disposition:   FU with Adja Ruff C. Duke Salvia, MD, [facility] in 2 months.     This note was written with the assistance of speech recognition software.  Please excuse any transcriptional errors.  Signed, Raymundo Rout C. Duke Salvia, MD, [facility]  03/03/2017 4:55 PM    [facility]

## 2017-03-03 NOTE — Patient Instructions (Addendum)
Medication Instructions:  DISCUSS WITH YOUR PRIMARY CARE PHYSICIAN STOPPING YOUR AMITRIPTYLINE AND STARTING CHANTIX   Labwork: CMET/CBC/MAGNESIUM/TSH/FT4 AT YOUR UPCOMING APPOINTMENT   Testing/Procedures: Your physician has requested that you have an echocardiogram. Echocardiography is a painless test that uses sound waves to create images of your heart. It provides your doctor with information about the size and shape of your heart and how well your heart's chambers and valves are working. This procedure takes approximately one hour. There are no restrictions for this procedure. [facility] AT [address]  Follow-Up: Your physician recommends that you schedule a follow-up appointment in: 2 MONTH OV  If you need a refill on your cardiac medications before your next appointment, please call your pharmacy.

## 2017-03-04 ENCOUNTER — Other Ambulatory Visit: Payer: Self-pay

## 2017-03-04 ENCOUNTER — Ambulatory Visit (INDEPENDENT_AMBULATORY_CARE_PROVIDER_SITE_OTHER): Payer: 59 | Admitting: Family Medicine

## 2017-03-04 ENCOUNTER — Encounter: Payer: Self-pay | Admitting: Family Medicine

## 2017-03-04 VITALS — BP 110/64 | HR 109 | Temp 97.9°F | Resp 16 | Ht 62.0 in | Wt 293.4 lb

## 2017-03-04 DIAGNOSIS — R002 Palpitations: Secondary | ICD-10-CM

## 2017-03-04 DIAGNOSIS — Z794 Long term (current) use of insulin: Secondary | ICD-10-CM | POA: Diagnosis not present

## 2017-03-04 DIAGNOSIS — Z720 Tobacco use: Secondary | ICD-10-CM | POA: Diagnosis not present

## 2017-03-04 DIAGNOSIS — R252 Cramp and spasm: Secondary | ICD-10-CM | POA: Diagnosis not present

## 2017-03-04 DIAGNOSIS — E1142 Type 2 diabetes mellitus with diabetic polyneuropathy: Secondary | ICD-10-CM

## 2017-03-04 DIAGNOSIS — R000 Tachycardia, unspecified: Secondary | ICD-10-CM | POA: Diagnosis not present

## 2017-03-04 DIAGNOSIS — G43109 Migraine with aura, not intractable, without status migrainosus: Secondary | ICD-10-CM

## 2017-03-04 MED ORDER — VARENICLINE TARTRATE 0.5 MG X 11 & 1 MG X 42 PO MISC: ORAL | 0 refills | Status: DC

## 2017-03-04 NOTE — Progress Notes (Signed)
Chief Complaint  Patient presents with  . Follow-up    6 month followup    HPI   Pt is here to follow up for diabtes  Diabetes Mellitus: Patient presents for follow up of diabetes. Symptoms: hypoglycemia . She reports once 2 months ago she woke up with glucose of 46. She felt like she was trembling, could not see, dizzy and light headed.  She reports that she has not had any other episodes.  She states that she has unintentionally taken Kelsey double dose of insulin because she took it and forgot she took it. Now she marks down when she takes her insulin. Symptoms have stabilized. Patient denies hyperglycemia, increase appetite, nausea, paresthesia of the feet, polydipsia, polyuria, visual disturbances and vomitting.  Evaluation to date has been included: hemoglobin A1C.  Home sugars: BGs consistently in an acceptable range.  Fasting 118 Overnight 136 after dinner 1-2 hours after  Her typical range is 110s-120s   Tachycardia and migraines She reports that she has been having tachycardia She has been on fioricet in the past daily for headache prevention She states that she was also on lyrica She also tried maxalt She was tried on topiramate and gabapentin which causes hallucinations She reports that only the amitriptilline helps her and she is not concerned about her tachycardia.  Her cardiologist advised that she should try something else for her migraines if there are other options that she can tolerate and also advised her to get an Echo    Past Medical History:  Diagnosis Date  . Diabetes mellitus without complication (HCC)   . Hyperlipemia     Current Outpatient Medications  Medication Sig Dispense Refill  . amitriptyline (ELAVIL) 100 MG tablet Take 1 tablet (100 mg total) by mouth at bedtime. 90 tablet 1  . atorvastatin (LIPITOR) 80 MG tablet Take 1 tablet (80 mg total) by mouth daily. 90 tablet 3  . benzonatate (TESSALON) 100 MG capsule Take 1-2 capsules (100-200 mg total) by  mouth 3 (three) times daily as needed for cough. 40 capsule 0  . Blood Glucose Monitoring Suppl (ACCU-CHEK AVIVA) device Use as instructed to check sugar daily 1 each 0  . cetirizine (ZYRTEC) 10 MG tablet Take 10 mg by mouth daily.    . Cholecalciferol (VITAMIN D) 2000 units CAPS Take 1 capsule by mouth daily.    . cyclobenzaprine (FLEXERIL) 10 MG tablet Take 1 tablet (10 mg total) by mouth 3 (three) times daily as needed for muscle spasms. 90 tablet 0  . ezetimibe (ZETIA) 10 MG tablet Take 1 tablet (10 mg total) by mouth daily. 90 tablet 3  . fluticasone (FLONASE) 50 MCG/ACT nasal spray Place 1 spray into both nostrils 2 (two) times daily.    Marland Kitchen glucose blood (ACCU-CHEK AVIVA) test strip Use as instructed to check sugar 2-3 times daily 300 each 5  . insulin glargine (LANTUS) 100 UNIT/ML injection Inject 0.34 mLs (34 Units total) into the skin at bedtime. Pens please. 45 mL 3  . insulin lispro (HUMALOG) 100 UNIT/ML injection Inject under skin 6-10 units for meal. Pens. 45 mL 3  . Insulin Pen Needle 32G X 4 MM MISC Use to inject insulin 4 times daily 400 each 5  . lisinopril (PRINIVIL,ZESTRIL) 5 MG tablet Take 1 tablet (5 mg total) by mouth daily. 90 tablet 1  . metFORMIN (GLUCOPHAGE-XR) 500 MG 24 hr tablet Take 2 tablets (1,000 mg total) by mouth 2 (two) times daily with Kelsey meal. 360 tablet 3  .  montelukast (SINGULAIR) 10 MG tablet Take 1 tablet (10 mg total) by mouth at bedtime. 90 tablet 3  . Omega-3 Fatty Acids (FISH OIL) 1000 MG CAPS Take 1 capsule by mouth 2 (two) times daily.    . pantoprazole (PROTONIX) 40 MG tablet Take 1 tablet (40 mg total) by mouth 2 (two) times daily. 60 tablet 2  . sitaGLIPtin (JANUVIA) 100 MG tablet Take 1 tablet (100 mg total) by mouth daily. 90 tablet 3  . SUMAtriptan (IMITREX) 25 MG tablet Take at the first sign of migraine. May repeat in 2 hours if headache persists or recurs. No more than 2 doses in Kelsey 24hr period. 10 tablet 3  . ACCU-CHEK FASTCLIX LANCETS MISC Use  to check sugar 2-3 times daily. 300 each 5  . varenicline (CHANTIX STARTING MONTH PAK) 0.5 MG X 11 & 1 MG X 42 tablet Take one 0.5 mg tablet by mouth once daily for 3 days, then increase to one 0.5 mg tablet twice daily for 4 days, then increase to one 1 mg tablet twice daily. 53 tablet 0   No current facility-administered medications for this visit.     Allergies:  Allergies  Allergen Reactions  . Gabapentin Other (See Comments)  . Levemir [Insulin Detemir] Dermatitis    Whelps at injection site  . Keflex [Cephalexin] Rash    Past Surgical History:  Procedure Laterality Date  . CHOLECYSTECTOMY    . ENDOMETRIAL ABLATION    . GALLBLADDER SURGERY    . TUBAL LIGATION      Social History   Socioeconomic History  . Marital status: Married    Spouse name: Not on file  . Number of children: Not on file  . Years of education: Not on file  . Highest education level: Not on file  Social Needs  . Financial resource strain: Not on file  . Food insecurity - worry: Not on file  . Food insecurity - inability: Not on file  . Transportation needs - medical: Not on file  . Transportation needs - non-medical: Not on file  Occupational History  . Not on file  Tobacco Use  . Smoking status: Current Every Day Smoker    Packs/day: 0.50    Years: 20.00    Pack years: 10.00    Types: Cigarettes  . Smokeless tobacco: Never Used  Substance and Sexual Activity  . Alcohol use: No  . Drug use: No  . Sexual activity: No  Other Topics Concern  . Not on file  Social History Narrative  . Not on file    Family History  Problem Relation Age of Onset  . Cancer Mother   . Diabetes Father   . Liver disease Father   . Heart attack Maternal Grandmother   . Cancer Paternal Grandfather     Review of Systems  Constitutional: Negative for chills and fever.  HENT: Negative for congestion, hearing loss and nosebleeds.   Respiratory: Negative for cough, shortness of breath and wheezing.     Cardiovascular: Negative for chest pain and palpitations.  Gastrointestinal: Negative for abdominal pain, nausea and vomiting.  Genitourinary: Negative for dysuria and urgency.  Skin: Negative for itching and rash.  Neurological: Negative for dizziness and headaches.  Psychiatric/Behavioral: Negative for depression. The patient is not nervous/anxious.     Objective: Vitals:   03/04/17 1018  BP: 110/64  Pulse: (!) 109  Resp: 16  Temp: 97.9 F (36.6 C)  TempSrc: Oral  SpO2: 96%  Weight: 293 lb 6.4  oz (133.1 kg)  Height: 5\' 2"  (1.575 m)    Physical Exam  Constitutional: She is oriented to person, place, and time. She appears well-developed and well-nourished.  HENT:  Head: Normocephalic and atraumatic.  Eyes: Conjunctivae and EOM are normal.  Cardiovascular: Regular rhythm and normal heart sounds. Tachycardia present.  No murmur heard. Pulmonary/Chest: Effort normal and breath sounds normal. No stridor. No respiratory distress.  Abdominal: Soft. Bowel sounds are normal. She exhibits no distension. There is no tenderness. There is no guarding.  Musculoskeletal: Normal range of motion. She exhibits no edema.  Neurological: She is alert and oriented to person, place, and time.  Psychiatric: She has Kelsey normal mood and affect. Her behavior is normal. Judgment and thought content normal.    Assessment and Plan Kelsey Vang was seen today for follow-up.  Diagnoses and all orders for this visit:  Tachycardia Palpitations Given that pt is tolerating amitriptyline and her HR is <120 -     Comprehensive metabolic panel -     CBC with Differential/Platelet -     Magnesium -     TSH + free T4  Type 2 diabetes mellitus with diabetic polyneuropathy, with long-term current use of insulin (HCC)- discussed diabetes mgmt and liver and renal function today -     Comprehensive metabolic panel  Muscle cramping- will check lytes -     Comprehensive metabolic panel  Migraine with aura and  without status migrainosus, not intractable- discussed check for modifiable risk factors -     Comprehensive metabolic panel -     CBC with Differential/Platelet -     Magnesium -     TSH + free T4  Current tobacco use Discussed smoking cessation and discussed options including patches, lozenges and nicotine replacement Discussed chantix and common side effects explained and reviewed -     varenicline (CHANTIX STARTING MONTH PAK) 0.5 MG X 11 & 1 MG X 42 tablet; Take one 0.5 mg tablet by mouth once daily for 3 days, then increase to one 0.5 mg tablet twice daily for 4 days, then increase to one 1 mg tablet twice daily.     Kelsey Vang Kelsey Vang

## 2017-03-04 NOTE — Patient Instructions (Addendum)
IF you received an x-ray today, you will receive an invoice from [facility]. Please contact [facility] at [phone] with questions or concerns regarding your invoice.   IF you received labwork today, you will receive an invoice from [place]. Please contact [facility] at [phone] with questions or concerns regarding your invoice.   Our billing staff will not be able to assist you with questions regarding bills from these companies.  You will be contacted with the lab results as soon as they are available. The fastest way to get your results is to activate your My Chart account. Instructions are located on the last page of this paperwork. If you have not heard from [place] regarding the results in 2 weeks, please contact this office.    We recommend that you schedule a mammogram for breast cancer screening. Typically, you do not need a referral to do this. Please contact a local imaging center to schedule your mammogram.  [facility] - [phone]  *ask for the Radiology Department [facility]) - [phone] or [phone]  [facility] - [phone] [facility] - [phone] [facility] - [phone]  *ask for the Radiology Department [facility] - [phone]  *ask for the Radiology Department [facility] - [phone]  *ask for the Mammography Department [facility] - [phone] Steps to Quit Smoking Smoking tobacco can be harmful to your health and can affect almost every organ in your body. Smoking puts you, and those around you, at risk for developing many serious chronic diseases. Quitting smoking is difficult, but it is one of the best things that you can do for your health. It is never too late to quit. What are the benefits of quitting smoking? When you quit smoking, you lower your risk of developing serious diseases and conditions, such  as:  Lung cancer or lung disease, such as COPD.  Heart disease.  Stroke.  Heart attack.  Infertility.  Osteoporosis and bone fractures.  Additionally, symptoms such as coughing, wheezing, and shortness of breath may get better when you quit. You may also find that you get sick less often because your body is stronger at fighting off colds and infections. If you are pregnant, quitting smoking can help to reduce your chances of having a baby of low birth weight. How do I get ready to quit? When you decide to quit smoking, create a plan to make sure that you are successful. Before you quit:  Pick a date to quit. Set a date within the next two weeks to give you time to prepare.  Write down the reasons why you are quitting. Keep this list in places where you will see it often, such as on your bathroom mirror or in your car or wallet.  Identify the people, places, things, and activities that make you want to smoke (triggers) and avoid them. Make sure to take these actions: ? Throw away all cigarettes at home, at work, and in your car. ? Throw away smoking accessories, such as Set designer. ? Clean your car and make sure to empty the ashtray. ? Clean your home, including curtains and carpets.  Tell your family, friends, and coworkers that you are quitting. Support from your loved ones can make quitting easier.  Talk with your health care provider about your options for quitting smoking.  Find out what treatment options are covered by your health insurance.  What strategies can I use to quit smoking? Talk with your healthcare provider about different strategies to quit smoking. Some strategies include:  Quitting smoking altogether instead of gradually lessening how much you smoke over a period of time. Research shows that quitting "cold [place]key" is more successful than gradually quitting.  Attending in-person counseling to help you build problem-solving skills. You are more  likely to have success in quitting if you attend several counseling sessions. Even short sessions of 10 minutes can be effective.  Finding resources and support systems that can help you to quit smoking and remain smoke-free after you quit. These resources are most helpful when you use them often. They can include: ? Online chats with a Veterinary surgeoncounselor. ? Telephone quitlines. ? Automotive engineerrinted self-help materials. ? Support groups or group counseling. ? Text messaging programs. ? Mobile phone applications.  Taking medicines to help you quit smoking. (If you are pregnant or breastfeeding, talk with your health care provider first.) Some medicines contain nicotine and some do not. Both types of medicines help with cravings, but the medicines that include nicotine help to relieve withdrawal symptoms. Your health care provider may recommend: ? Nicotine patches, gum, or lozenges. ? Nicotine inhalers or sprays. ? Non-nicotine medicine that is taken by mouth.  Talk with your health care provider about combining strategies, such as taking medicines while you are also receiving in-person counseling. Using these two strategies together makes you more likely to succeed in quitting than if you used either strategy on its own. If you are pregnant or breastfeeding, talk with your health care provider about finding counseling or other support strategies to quit smoking. Do not take medicine to help you quit smoking unless told to do so by your health care provider. What things can I do to make it easier to quit? Quitting smoking might feel overwhelming at first, but there is a lot that you can do to make it easier. Take these important actions:  Reach out to your family and friends and ask that they support and encourage you during this time. Call telephone quitlines, reach out to support groups, or work with a counselor for support.  Ask people who smoke to avoid smoking around you.  Avoid places that trigger you to  smoke, such as bars, parties, or smoke-break areas at work.  Spend time around people who do not smoke.  Lessen stress in your life, because stress can be a smoking trigger for some people. To lessen stress, try: ? Exercising regularly. ? Deep-breathing exercises. ? Yoga. ? Meditating. ? Performing a body scan. This involves closing your eyes, scanning your body from head to toe, and noticing which parts of your body are particularly tense. Purposefully relax the muscles in those areas.  Download or purchase mobile phone or tablet apps (applications) that can help you stick to your quit plan by providing reminders, tips, and encouragement. There are many free apps, such as QuitGuide from the [employer] developer(Centers for Disease Control and Prevention). You can find other support for quitting smoking (smoking cessation) through smokefree.gov and other websites.  How will I feel when I quit smoking? Within the first 24 hours of quitting smoking, you may start to feel some withdrawal symptoms. These symptoms are usually most noticeable 2-3 days after quitting, but they usually do not last beyond 2-3 weeks. Changes or symptoms that you might experience include:  Mood swings.  Restlessness, anxiety, or irritation.  Difficulty concentrating.  Dizziness.  Strong cravings for sugary foods in  addition to nicotine.  Mild weight gain.  Constipation.  Nausea.  Coughing or a sore throat.  Changes in how your medicines work in your body.  A depressed mood.  Difficulty sleeping (insomnia).  After the first 2-3 weeks of quitting, you may start to notice more positive results, such as:  Improved sense of smell and taste.  Decreased coughing and sore throat.  Slower heart rate.  Lower blood pressure.  Clearer skin.  The ability to breathe more easily.  Fewer sick days.  Quitting smoking is very challenging for most people. Do not get discouraged if you are not successful the first time.  Some people need to make many attempts to quit before they achieve long-term success. Do your best to stick to your quit plan, and talk with your health care provider if you have any questions or concerns. This information is not intended to replace advice given to you by your health care provider. Make sure you discuss any questions you have with your health care provider. Document Released: 03/29/2001 Document Revised: 12/01/2015 Document Reviewed: 08/19/2014 Elsevier Interactive Patient Education  2017 [employer].

## 2017-03-05 LAB — COMPREHENSIVE METABOLIC PANEL
ALBUMIN: 3.9 g/dL (ref 3.5–5.5)
ALT: 45 IU/L — ABNORMAL HIGH (ref 0–32)
AST: 35 IU/L (ref 0–40)
Albumin/Globulin Ratio: 1.6 (ref 1.2–2.2)
Alkaline Phosphatase: 102 IU/L (ref 39–117)
BUN/Creatinine Ratio: 15 (ref 9–23)
BUN: 10 mg/dL (ref 6–24)
Bilirubin Total: 0.3 mg/dL (ref 0.0–1.2)
CALCIUM: 10.1 mg/dL (ref 8.7–10.2)
CO2: 24 mmol/L (ref 20–29)
CREATININE: 0.65 mg/dL (ref 0.57–1.00)
Chloride: 103 mmol/L (ref 96–106)
GFR calc Af Amer: 128 mL/min/{1.73_m2} (ref >59)
GFR, EST NON AFRICAN AMERICAN: 111 mL/min/{1.73_m2} (ref >59)
GLUCOSE: 162 mg/dL — AB (ref 65–99)
Globulin, Total: 2.5 g/dL (ref 1.5–4.5)
Potassium: 4.8 mmol/L (ref 3.5–5.2)
Sodium: 141 mmol/L (ref 134–144)
Total Protein: 6.4 g/dL (ref 6.0–8.5)

## 2017-03-05 LAB — CBC WITH DIFFERENTIAL/PLATELET
Basophils Absolute: 0 10*3/uL (ref 0.0–0.2)
Basos: 0 %
EOS (ABSOLUTE): 0.9 10*3/uL — ABNORMAL HIGH (ref 0.0–0.4)
EOS: 7 %
HEMATOCRIT: 44.3 % (ref 34.0–46.6)
HEMOGLOBIN: 14.5 g/dL (ref 11.1–15.9)
IMMATURE GRANS (ABS): 0.1 10*3/uL (ref 0.0–0.1)
IMMATURE GRANULOCYTES: 1 %
Lymphocytes Absolute: 3.4 10*3/uL — ABNORMAL HIGH (ref 0.7–3.1)
Lymphs: 26 %
MCH: 30.7 pg (ref 26.6–33.0)
MCHC: 32.7 g/dL (ref 31.5–35.7)
MCV: 94 fL (ref 79–97)
MONOCYTES: 5 %
Monocytes Absolute: 0.7 10*3/uL (ref 0.1–0.9)
NEUTROS PCT: 61 %
Neutrophils Absolute: 8.1 10*3/uL — ABNORMAL HIGH (ref 1.4–7.0)
Platelets: 327 10*3/uL (ref 150–379)
RBC: 4.73 x10E6/uL (ref 3.77–5.28)
RDW: 15.7 % — AB (ref 12.3–15.4)
WBC: 13.1 10*3/uL — AB (ref 3.4–10.8)

## 2017-03-05 LAB — TSH+FREE T4
FREE T4: 1.21 ng/dL (ref 0.82–1.77)
TSH: 2.01 u[IU]/mL (ref 0.450–4.500)

## 2017-03-05 LAB — MAGNESIUM: Magnesium: 1.7 mg/dL (ref 1.6–2.3)

## 2017-03-06 ENCOUNTER — Ambulatory Visit: Payer: Self-pay

## 2017-03-06 MED FILL — SUMATRIPTAN SUCC 25 MG TAB: 25 | 20 days supply | Qty: 10 | Fill #1

## 2017-03-06 MED FILL — CHANTIX STARTING MONTH BOX: 0.5 MG X 11 | 28 days supply | Qty: 53 | Fill #0

## 2017-03-15 ENCOUNTER — Encounter: Payer: Self-pay | Admitting: Neurology

## 2017-03-17 ENCOUNTER — Other Ambulatory Visit (HOSPITAL_COMMUNITY): Payer: 59

## 2017-03-17 ENCOUNTER — Other Ambulatory Visit: Payer: Self-pay | Admitting: Family Medicine

## 2017-03-17 MED FILL — METFORMIN HCL ER 500 MG TAB: 500 | 90 days supply | Qty: 360 | Fill #3

## 2017-03-17 MED FILL — JANUVIA 100 MG TABLET: 100 | 90 days supply | Qty: 90 | Fill #3

## 2017-03-17 MED FILL — LISINOPRIL 5 MG TABLET: 5 | 90 days supply | Qty: 90 | Fill #0

## 2017-03-17 MED FILL — MONTELUKAST SOD 10 MG TAB: 10 | 90 days supply | Qty: 90 | Fill #2

## 2017-03-20 ENCOUNTER — Other Ambulatory Visit: Payer: Self-pay | Admitting: Internal Medicine

## 2017-03-20 ENCOUNTER — Encounter: Payer: Self-pay | Admitting: Internal Medicine

## 2017-03-20 MED ORDER — INSULIN LISPRO 100 UNIT/ML ~~LOC~~ SOLN
SUBCUTANEOUS | 3 refills | Status: DC
Start: 2017-03-20 — End: 2017-05-26

## 2017-03-21 MED FILL — HUMALOG 100 UNITS/ML KWIKPE: 100 | 90 days supply | Qty: 42 | Fill #0

## 2017-03-27 ENCOUNTER — Ambulatory Visit: Payer: 59 | Admitting: Internal Medicine

## 2017-04-03 ENCOUNTER — Ambulatory Visit (INDEPENDENT_AMBULATORY_CARE_PROVIDER_SITE_OTHER): Payer: 59 | Admitting: Family Medicine

## 2017-04-03 ENCOUNTER — Other Ambulatory Visit: Payer: Self-pay

## 2017-04-03 ENCOUNTER — Encounter: Payer: Self-pay | Admitting: Family Medicine

## 2017-04-03 VITALS — BP 118/76 | HR 114 | Temp 98.8°F | Resp 18 | Ht 62.0 in | Wt 290.6 lb

## 2017-04-03 DIAGNOSIS — M5441 Lumbago with sciatica, right side: Secondary | ICD-10-CM | POA: Diagnosis not present

## 2017-04-03 DIAGNOSIS — R252 Cramp and spasm: Secondary | ICD-10-CM | POA: Diagnosis not present

## 2017-04-03 DIAGNOSIS — M5442 Lumbago with sciatica, left side: Secondary | ICD-10-CM

## 2017-04-03 MED ORDER — DICLOFENAC SODIUM 1 % TD GEL: 2.0000 g | Freq: Four times a day (QID) | TRANSDERMAL | 0 refills | Status: DC

## 2017-04-03 MED ORDER — MELOXICAM 7.5 MG PO TABS: 7.5000 mg | ORAL_TABLET | Freq: Every day | ORAL | 0 refills | Status: DC

## 2017-04-03 MED ORDER — HYDROCODONE-ACETAMINOPHEN 5-325 MG PO TABS: 1.0000 | ORAL_TABLET | Freq: Four times a day (QID) | ORAL | 0 refills | Status: DC | PRN

## 2017-04-03 NOTE — Patient Instructions (Addendum)
IF you received an x-ray today, you will receive an invoice from [facility]. Please contact [facility] at [phone] with questions or concerns regarding your invoice.   IF you received labwork today, you will receive an invoice from [place]. Please contact [facility] at [phone] with questions or concerns regarding your invoice.   Our billing staff will not be able to assist you with questions regarding bills from these companies.  You will be contacted with the lab results as soon as they are available. The fastest way to get your results is to activate your My Chart account. Instructions are located on the last page of this paperwork. If you have not heard from [place] regarding the results in 2 weeks, please contact this office.    We recommend that you schedule a mammogram for breast cancer screening. Typically, you do not need a referral to do this. Please contact a local imaging center to schedule your mammogram.  [facility] - [phone]  *ask for the Radiology Department [facility]) - [phone] or [phone]  [facility] - [phone] [facility] - [phone] [facility] - [phone]  *ask for the Radiology Department [facility] - [phone]  *ask for the Radiology Department [facility] - [phone]  *ask for the Mammography Department [facility] - [phone] Back Exercises The following exercises strengthen the muscles that help to support the back. They also help to keep the lower back flexible. Doing these exercises can help to prevent back pain or lessen existing pain. If you have back pain or discomfort, try doing these exercises 2-3 times each day or as told by your health care provider. When the pain goes away, do them once each day, but increase the number of times that you repeat the steps for each exercise (do more  repetitions). If you do not have back pain or discomfort, do these exercises once each day or as told by your health care provider. Exercises Single Knee to Chest  Repeat these steps 3-5 times for each leg: 1. Lie on your back on a firm bed or the floor with your legs extended. 2. Bring one knee to your chest. Your other leg should stay extended and in contact with the floor. 3. Hold your knee in place by grabbing your knee or thigh. 4. Pull on your knee until you feel a gentle stretch in your lower back. 5. Hold the stretch for 10-30 seconds. 6. Slowly release and straighten your leg.  Pelvic Tilt  Repeat these steps 5-10 times: 1. Lie on your back on a firm bed or the floor with your legs extended. 2. Bend your knees so they are pointing toward the ceiling and your feet are flat on the floor. 3. Tighten your lower abdominal muscles to press your lower back against the floor. This motion will tilt your pelvis so your tailbone points up toward the ceiling instead of pointing to your feet or the floor. 4. With gentle tension and even breathing, hold this position for 5-10 seconds.  Cat-Cow  Repeat these steps until your lower back becomes more flexible: 1. Get into a hands-and-knees position on a firm surface. Keep your hands under your shoulders, and keep your knees under your hips. You may place padding under your knees for comfort. 2. Let your head hang down, and point your tailbone toward the floor so your lower back becomes rounded  like the back of a cat. 3. Hold this position for 5 seconds. 4. Slowly lift your head and point your tailbone up toward the ceiling so your back forms a sagging arch like the back of a cow. 5. Hold this position for 5 seconds.  Press-Ups  Repeat these steps 5-10 times: 1. Lie on your abdomen (face-down) on the floor. 2. Place your palms near your head, about shoulder-width apart. 3. While you keep your back as relaxed as possible and keep your hips on  the floor, slowly straighten your arms to raise the top half of your body and lift your shoulders. Do not use your back muscles to raise your upper torso. You may adjust the placement of your hands to make yourself more comfortable. 4. Hold this position for 5 seconds while you keep your back relaxed. 5. Slowly return to lying flat on the floor.  Bridges  Repeat these steps 10 times: 1. Lie on your back on a firm surface. 2. Bend your knees so they are pointing toward the ceiling and your feet are flat on the floor. 3. Tighten your buttocks muscles and lift your buttocks off of the floor until your waist is at almost the same height as your knees. You should feel the muscles working in your buttocks and the back of your thighs. If you do not feel these muscles, slide your feet 1-2 inches farther away from your buttocks. 4. Hold this position for 3-5 seconds. 5. Slowly lower your hips to the starting position, and allow your buttocks muscles to relax completely.  If this exercise is too easy, try doing it with your arms crossed over your chest.  Back Lifts Repeat these steps 5-10 times: 1. Lie on your abdomen (face-down) with your arms at your sides, and rest your forehead on the floor. 2. Tighten the muscles in your legs and your buttocks. 3. Slowly lift your chest off of the floor while you keep your hips pressed to the floor. Keep the back of your head in line with the curve in your back. Your eyes should be looking at the floor. 4. Hold this position for 3-5 seconds. 5. Slowly return to your starting position.  Contact a health care provider if:  Your back pain or discomfort gets much worse when you do an exercise.  Your back pain or discomfort does not lessen within 2 hours after you exercise. If you have any of these problems, stop doing these exercises right away. Do not do them again unless your health care provider says that you can. Get help right away if:  You develop sudden,  severe back pain. If this happens, stop doing the exercises right away. Do not do them again unless your health care provider says that you can. This information is not intended to replace advice given to you by your health care provider. Make sure you discuss any questions you have with your health care provider. Document Released: 05/12/2004 Document Revised: 08/12/2015 Document Reviewed: 05/29/2014 Elsevier Interactive Patient Education  2017 [employer]

## 2017-04-03 NOTE — Progress Notes (Signed)
Chief Complaint  Patient presents with  . Muscle Pain    possible pulled muscle in low back sacral lumbar x 1 week, could feel knot that came up in area, pain radiates down into legs lots of pain in the thighs, ? bursitis.  Pain level 9/10 and taking left over flexeril and meloxicam    HPI   Pt reports that Tuesday of last week 03/28/17 she was having a pulling sensation in her back and felt a knot form on the left side. She put a menthol pain relieving patch on the area and got some relief from that. She states that after that she went to work on Wednesday and Thursday. On Firday 03/31/2017 she felt the back muscle spasm and took alleve which helped her finish out her day. Saturday 04/01/17 and 04/02/17 as well as present she has been having worsening pain. Pain is 9/10. She states that the meloxicam and flexeril are not helping her symptoms. She feels like she aggravated her low back pain from July 2018. She can feel pain radiating down her thighs bilaterally but it is worse on the left than on the right.   Wt Readings from Last 3 Encounters:  04/03/17 290 lb 9.6 oz (131.8 kg)  03/04/17 293 lb 6.4 oz (133.1 kg)  03/03/17 296 lb (134.3 kg)      Past Medical History:  Diagnosis Date  . Diabetes mellitus without complication (HCC)   . Hyperlipemia     Current Outpatient Medications  Medication Sig Dispense Refill  . ACCU-CHEK FASTCLIX LANCETS MISC Use to check sugar 2-3 times daily. 300 each 5  . amitriptyline (ELAVIL) 100 MG tablet Take 1 tablet (100 mg total) by mouth at bedtime. 90 tablet 1  . atorvastatin (LIPITOR) 80 MG tablet Take 1 tablet (80 mg total) by mouth daily. 90 tablet 3  . Blood Glucose Monitoring Suppl (ACCU-CHEK AVIVA) device Use as instructed to check sugar daily 1 each 0  . cetirizine (ZYRTEC) 10 MG tablet Take 10 mg by mouth daily.    . Cholecalciferol (VITAMIN D) 2000 units CAPS Take 1 capsule by mouth daily.    . cyclobenzaprine (FLEXERIL) 10 MG tablet Take  1 tablet (10 mg total) by mouth 3 (three) times daily as needed for muscle spasms. 90 tablet 0  . ezetimibe (ZETIA) 10 MG tablet Take 1 tablet (10 mg total) by mouth daily. 90 tablet 3  . fluticasone (FLONASE) 50 MCG/ACT nasal spray Place 1 spray into both nostrils 2 (two) times daily.    Marland Kitchen. glucose blood (ACCU-CHEK AVIVA) test strip Use as instructed to check sugar 2-3 times daily 300 each 5  . insulin glargine (LANTUS) 100 UNIT/ML injection Inject 0.34 mLs (34 Units total) into the skin at bedtime. Pens please. 45 mL 3  . insulin lispro (HUMALOG) 100 UNIT/ML injection Inject under skin 12-15 units for meal. Pens. 45 mL 3  . Insulin Pen Needle 32G X 4 MM MISC Use to inject insulin 4 times daily 400 each 5  . lisinopril (PRINIVIL,ZESTRIL) 5 MG tablet TAKE 1 TABLET BY MOUTH ONCE DAILY 90 tablet 1  . metFORMIN (GLUCOPHAGE-XR) 500 MG 24 hr tablet Take 2 tablets (1,000 mg total) by mouth 2 (two) times daily with a meal. 360 tablet 3  . montelukast (SINGULAIR) 10 MG tablet Take 1 tablet (10 mg total) by mouth at bedtime. 90 tablet 3  . Omega-3 Fatty Acids (FISH OIL) 1000 MG CAPS Take 1 capsule by mouth 2 (two) times daily.    .Marland Kitchen  pantoprazole (PROTONIX) 40 MG tablet Take 1 tablet (40 mg total) by mouth 2 (two) times daily. 60 tablet 2  . sitaGLIPtin (JANUVIA) 100 MG tablet Take 1 tablet (100 mg total) by mouth daily. 90 tablet 3  . SUMAtriptan (IMITREX) 25 MG tablet Take at the first sign of migraine. May repeat in 2 hours if headache persists or recurs. No more than 2 doses in a 24hr period. 10 tablet 3  . varenicline (CHANTIX STARTING MONTH PAK) 0.5 MG X 11 & 1 MG X 42 tablet Take one 0.5 mg tablet by mouth once daily for 3 days, then increase to one 0.5 mg tablet twice daily for 4 days, then increase to one 1 mg tablet twice daily. 53 tablet 0  . benzonatate (TESSALON) 100 MG capsule Take 1-2 capsules (100-200 mg total) by mouth 3 (three) times daily as needed for cough. (Patient not taking: Reported on  04/03/2017) 40 capsule 0  . diclofenac sodium (VOLTAREN) 1 % GEL Apply 2 g topically 4 (four) times daily. 100 g 0  . HYDROcodone-acetaminophen (NORCO) 5-325 MG tablet Take 1 tablet by mouth every 6 (six) hours as needed for moderate pain. 30 tablet 0  . meloxicam (MOBIC) 7.5 MG tablet Take 1 tablet (7.5 mg total) by mouth daily. 30 tablet 0   No current facility-administered medications for this visit.     Allergies:  Allergies  Allergen Reactions  . Gabapentin Other (See Comments)  . Levemir [Insulin Detemir] Dermatitis    Whelps at injection site  . Keflex [Cephalexin] Rash    Past Surgical History:  Procedure Laterality Date  . CHOLECYSTECTOMY    . ENDOMETRIAL ABLATION    . GALLBLADDER SURGERY    . TUBAL LIGATION      Social History   Socioeconomic History  . Marital status: Married    Spouse name: None  . Number of children: None  . Years of education: None  . Highest education level: None  Social Needs  . Financial resource strain: None  . Food insecurity - worry: None  . Food insecurity - inability: None  . Transportation needs - medical: None  . Transportation needs - non-medical: None  Occupational History  . None  Tobacco Use  . Smoking status: Current Every Day Smoker    Packs/day: 0.50    Years: 20.00    Pack years: 10.00    Types: Cigarettes  . Smokeless tobacco: Never Used  Substance and Sexual Activity  . Alcohol use: No  . Drug use: No  . Sexual activity: No  Other Topics Concern  . None  Social History Narrative  . None    Family History  Problem Relation Age of Onset  . Cancer Mother   . Diabetes Father   . Liver disease Father   . Heart attack Maternal Grandmother   . Cancer Paternal Grandfather      ROS Review of Systems See HPI Constitution: No fevers or chills No malaise No diaphoresis Skin: No rash or itching Eyes: no blurry vision, no double vision GU: no dysuria or hematuria Neuro: no dizziness or headaches  all  others reviewed and negative   Objective: Vitals:   04/03/17 1608 04/03/17 1641  BP: 118/76   Pulse: (!) 133 (!) 114  Resp: 18   Temp: 98.8 F (37.1 C)   TempSrc: Oral   SpO2: 98%   Weight: 290 lb 9.6 oz (131.8 kg)   Height: 5\' 2"  (1.575 m)     Physical Exam  Low back exam Tender of the lumbar spine and the SI joints bilaterally Obese patient  No pain on log rolling Normal hip range of motion Patellar reflexes 2+ Normal knee exam No deformity of the spine No edema   Assessment and Plan Kelsey Vang was seen today for muscle pain.  Diagnoses and all orders for this visit:  Acute bilateral low back pain with bilateral sciatica -     HYDROcodone-acetaminophen (NORCO) 5-325 MG tablet; Take 1 tablet by mouth every 6 (six) hours as needed for moderate pain.  Muscle cramping  Other orders -     meloxicam (MOBIC) 7.5 MG tablet; Take 1 tablet (7.5 mg total) by mouth daily. -     diclofenac sodium (VOLTAREN) 1 % GEL; Apply 2 g topically 4 (four) times daily.  Will not do steroid since pt is a diabetic  For her low back pain she should take meloxicam, continue flexeril Topical voltaren norco for pain since she cannot tolerate gabapentin and is already of elavil Use ice to the back  Home stretches advised Work note given for 04/04/2017 off and return 04/05/17 with light duty  A total of 30 minutes were spent face-to-face with the patient during this encounter and over half of that time was spent on counseling and coordination of care.  Kavontae Pritchard A Charnese Federici

## 2017-04-05 ENCOUNTER — Other Ambulatory Visit: Payer: Self-pay | Admitting: Family Medicine

## 2017-04-05 MED FILL — AMITRIPTYLINE HCL 100 MG TA: 100 | 90 days supply | Qty: 90 | Fill #1

## 2017-04-05 NOTE — Telephone Encounter (Signed)
See attached  Chantix

## 2017-04-05 NOTE — Telephone Encounter (Signed)
Pt requesting refill on Chantix  LOV 04/03/17 with Dr. Creta Levin

## 2017-04-13 ENCOUNTER — Encounter: Payer: Self-pay | Admitting: Family Medicine

## 2017-04-13 ENCOUNTER — Telehealth: Payer: Self-pay | Admitting: Family Medicine

## 2017-04-13 NOTE — Telephone Encounter (Signed)
Copied from CRM [id number]. Topic: Quick Communication - See Telephone Encounter >> Apr 13, 2017  3:17 PM Rudi CocoLathan, Darlisa Spruiell M, [place] wrote: CRM for notification. See Telephone encounter for:   04/13/17. [facility] calling to see if Dr. Creta LevinStallings wants to change the rx. For chantix . Pt. Has already had the staring pack needing to know if she wants to get the continuous pack for pt. Elease Hashimotolisha can be reached at [phone]

## 2017-04-13 NOTE — Telephone Encounter (Signed)
chantix sent in

## 2017-04-16 ENCOUNTER — Encounter: Payer: Self-pay | Admitting: Internal Medicine

## 2017-04-17 ENCOUNTER — Other Ambulatory Visit: Payer: Self-pay | Admitting: Family Medicine

## 2017-04-17 MED ORDER — ACCU-CHEK FASTCLIX LANCETS MISC: 11 refills | Status: DC

## 2017-04-17 MED ORDER — GLUCOSE BLOOD VI STRP: ORAL_STRIP | 12 refills | Status: DC

## 2017-04-17 MED FILL — ACCU-CHEK FASTCLIX LANCETS: 30 days supply | Qty: 102 | Fill #0

## 2017-04-17 MED FILL — ACCU-CHEK GUIDE TEST STRIP: 30 days supply | Qty: 100 | Fill #0

## 2017-04-17 MED FILL — PANTOPRAZOLE SOD DR 40 MG T: 40 | 30 days supply | Qty: 60 | Fill #0

## 2017-04-19 ENCOUNTER — Telehealth: Payer: Self-pay | Admitting: Family Medicine

## 2017-04-19 NOTE — Telephone Encounter (Signed)
Copied from CRM [id number]. Topic: Quick Communication - See Telephone Encounter >> Apr 19, 2017 12:33 PM Rudi Coco, NT wrote: CRM for notification. See Telephone encounter for:   04/19/17. Pt. Calling and needing refill on med. Chantix pt. Can be reached at [phone]  [facility] [place] [place] - [address] [facility] [place] [address] Phone: [phone] Fax: [phone]

## 2017-04-19 NOTE — Telephone Encounter (Signed)
Patient stated that the prescription that was sent in on the 27th was for the Chantix starter pack.  She said she does not need a starter pack.  She has finished that.  She is in need of a prescription for it now: Chantix 1mg  twice a day.

## 2017-04-19 NOTE — Telephone Encounter (Signed)
Attempted to call pt at number listed [phone]- not correct number-female answered and was not Ms Kelsey Vang 2nd number [phone] - LMOVM that rx she requested was sent in on 12/27.

## 2017-04-20 NOTE — Telephone Encounter (Signed)
Please advise 

## 2017-04-22 ENCOUNTER — Telehealth: Payer: 59 | Admitting: Nurse Practitioner

## 2017-04-22 DIAGNOSIS — R05 Cough: Secondary | ICD-10-CM | POA: Diagnosis not present

## 2017-04-22 DIAGNOSIS — R059 Cough, unspecified: Secondary | ICD-10-CM

## 2017-04-22 MED ORDER — AZITHROMYCIN 250 MG PO TABS
ORAL_TABLET | ORAL | 0 refills | Status: DC
Start: 2017-04-22 — End: 2017-05-26

## 2017-04-22 NOTE — Progress Notes (Signed)

## 2017-04-24 ENCOUNTER — Other Ambulatory Visit: Payer: Self-pay | Admitting: Family Medicine

## 2017-04-24 MED FILL — LANTUS SOLOSTAR 100 UNITS/M: 100 | 44 days supply | Qty: 15 | Fill #2

## 2017-04-24 MED FILL — EZETIMIBE 10 MG TABS: 10 | 90 days supply | Qty: 90 | Fill #3

## 2017-04-24 MED FILL — ATORVASTATIN 80 MG TABLET: 80 | 90 days supply | Qty: 90 | Fill #3

## 2017-04-25 NOTE — Telephone Encounter (Signed)
Please see note below. 

## 2017-04-27 ENCOUNTER — Other Ambulatory Visit: Payer: Self-pay | Admitting: Family Medicine

## 2017-04-27 MED ORDER — VARENICLINE TARTRATE 1 MG PO TABS: 1.0000 mg | ORAL_TABLET | Freq: Two times a day (BID) | ORAL | 6 refills | Status: DC

## 2017-04-27 MED FILL — CHANTIX 1 MG CONT MONTH BOX: 1 | 28 days supply | Qty: 56 | Fill #0

## 2017-04-28 ENCOUNTER — Other Ambulatory Visit: Payer: Self-pay

## 2017-04-28 ENCOUNTER — Ambulatory Visit (HOSPITAL_COMMUNITY): Payer: 59 | Attending: Cardiology

## 2017-04-28 DIAGNOSIS — E785 Hyperlipidemia, unspecified: Secondary | ICD-10-CM | POA: Insufficient documentation

## 2017-04-28 DIAGNOSIS — R002 Palpitations: Secondary | ICD-10-CM | POA: Insufficient documentation

## 2017-04-28 DIAGNOSIS — Z6841 Body Mass Index (BMI) 40.0 and over, adult: Secondary | ICD-10-CM | POA: Insufficient documentation

## 2017-04-28 DIAGNOSIS — G4733 Obstructive sleep apnea (adult) (pediatric): Secondary | ICD-10-CM | POA: Diagnosis not present

## 2017-04-28 DIAGNOSIS — E119 Type 2 diabetes mellitus without complications: Secondary | ICD-10-CM | POA: Insufficient documentation

## 2017-04-28 NOTE — Telephone Encounter (Signed)
rx sent in for continuation pack

## 2017-04-29 ENCOUNTER — Telehealth: Payer: 59 | Admitting: Family

## 2017-04-29 DIAGNOSIS — J028 Acute pharyngitis due to other specified organisms: Secondary | ICD-10-CM

## 2017-04-29 DIAGNOSIS — B9689 Other specified bacterial agents as the cause of diseases classified elsewhere: Secondary | ICD-10-CM

## 2017-04-29 MED ORDER — LEVOFLOXACIN 500 MG PO TABS: 500.0000 mg | ORAL_TABLET | Freq: Every day | ORAL | 0 refills | Status: DC

## 2017-04-29 NOTE — Progress Notes (Signed)
Thank you for the details you included in the comment boxes. Those details are very helpful in determining the best course of treatment for you and help [place] to provide the best care. We can try a stronger treatment for you as below.  We are sorry that you are not feeling well.  Here is how we plan to help!  Based on your presentation I believe you most likely have A cough due to bacteria.  When patients have a fever and a productive cough with a change in color or increased sputum production, we are concerned about bacterial bronchitis.  If left untreated it can progress to pneumonia.  If your symptoms do not improve with your treatment plan it is important that you contact your provider.   I have prescribed Levofloxacin 500 mg daily for 7 days    In addition you may use A non-prescription cough medication called Mucinex DM: take 2 tablets every 12 hours.   lev  From your responses in the eVisit questionnaire you describe inflammation in the upper respiratory tract which is causing a significant cough.  This is commonly called Bronchitis and has four common causes:    Allergies  Viral Infections  Acid Reflux  Bacterial Infection Allergies, viruses and acid reflux are treated by controlling symptoms or eliminating the cause. An example might be a cough caused by taking certain blood pressure medications. You stop the cough by changing the medication. Another example might be a cough caused by acid reflux. Controlling the reflux helps control the cough.  USE OF BRONCHODILATOR ("RESCUE") INHALERS: There is a risk from using your bronchodilator too frequently.  The risk is that over-reliance on a medication which only relaxes the muscles surrounding the breathing tubes can reduce the effectiveness of medications prescribed to reduce swelling and congestion of the tubes themselves.  Although you feel brief relief from the bronchodilator inhaler, your asthma may actually be worsening with the tubes  becoming more swollen and filled with mucus.  This can delay other crucial treatments, such as oral steroid medications. If you need to use a bronchodilator inhaler daily, several times per day, you should discuss this with your provider.  There are probably better treatments that could be used to keep your asthma under control.     HOME CARE . Only take medications as instructed by your medical team. . Complete the entire course of an antibiotic. . Drink plenty of fluids and get plenty of rest. . Avoid close contacts especially the very young and the elderly . Cover your mouth if you cough or cough into your sleeve. . Always remember to wash your hands . A steam or ultrasonic humidifier can help congestion.   GET HELP RIGHT AWAY IF: . You develop worsening fever. . You become short of breath . You cough up blood. . Your symptoms persist after you have completed your treatment plan MAKE SURE YOU   Understand these instructions.  Will watch your condition.  Will get help right away if you are not doing well or get worse.  Your e-visit answers were reviewed by a board certified advanced clinical practitioner to complete your personal care plan.  Depending on the condition, your plan could have included both over the counter or prescription medications. If there is a problem please reply  once you have received a response from your provider. Your safety is important to [place].  If you have drug allergies check your prescription carefully.    You can use MyChart  to ask questions about today's visit, request a non-urgent call back, or ask for a work or school excuse for 24 hours related to this e-Visit. If it has been greater than 24 hours you will need to follow up with your provider, or enter a new e-Visit to address those concerns. You will get an e-mail in the next two days asking about your experience.  I hope that your e-visit has been valuable and will speed your recovery. Thank you for  using e-visits.

## 2017-05-03 ENCOUNTER — Telehealth: Payer: Self-pay | Admitting: *Deleted

## 2017-05-03 MED ORDER — METOPROLOL SUCCINATE ER 25 MG PO TB24
25.0000 mg | ORAL_TABLET | Freq: Every day | ORAL | 3 refills | Status: DC
Start: 2017-05-03 — End: 2018-06-06

## 2017-05-03 NOTE — Telephone Encounter (Signed)
Advised patient of results. She will monitor HR and BP and call if SBP below 100 more than once

## 2017-05-03 NOTE — Telephone Encounter (Signed)
-----   Message from Chilton Si, MD sent at 05/01/2017  9:43 AM EST ----- Echo shows that her heart squeezes well.  No significant abnormalities.  Recommend trying metoprolol succinate 25mg  daily to help slow her heart rate.

## 2017-05-04 MED FILL — METOPROLOL SUCC ER 25 MG TA: 25 | 90 days supply | Qty: 90 | Fill #0

## 2017-05-09 DIAGNOSIS — G4733 Obstructive sleep apnea (adult) (pediatric): Secondary | ICD-10-CM | POA: Diagnosis not present

## 2017-05-18 ENCOUNTER — Encounter: Payer: Self-pay | Admitting: Family Medicine

## 2017-05-18 ENCOUNTER — Ambulatory Visit (INDEPENDENT_AMBULATORY_CARE_PROVIDER_SITE_OTHER): Payer: 59 | Admitting: Family Medicine

## 2017-05-18 ENCOUNTER — Other Ambulatory Visit: Payer: Self-pay

## 2017-05-18 VITALS — BP 114/64 | HR 127 | Temp 98.1°F | Resp 20 | Ht 61.97 in | Wt 292.8 lb

## 2017-05-18 DIAGNOSIS — J069 Acute upper respiratory infection, unspecified: Secondary | ICD-10-CM

## 2017-05-18 DIAGNOSIS — R000 Tachycardia, unspecified: Secondary | ICD-10-CM

## 2017-05-18 DIAGNOSIS — R05 Cough: Secondary | ICD-10-CM | POA: Diagnosis not present

## 2017-05-18 DIAGNOSIS — R059 Cough, unspecified: Secondary | ICD-10-CM

## 2017-05-18 LAB — POCT INFLUENZA A/B
Influenza A, POC: NEGATIVE
Influenza B, POC: NEGATIVE

## 2017-05-18 NOTE — Progress Notes (Signed)
Chief Complaint  Patient presents with  . Cough    X 1 week- prt state that she has been exposed to flu  . Facial Pain    X 3 days  . Fatigue    X 1-2 weeks off and on    HPI   Patient reports that she has been coughing for one week  She has facial pain for 3 days She reports fatigue for 1-2 weeks  She reports that she completed an 8 day course of levaquin for bronchitis but she feels like she is still coughing She reports that her cough is dry She states that she gets DOE She states that she is drinking plenty of water She still feels very dry She is not breathing through her nose  She is taking mucinex and tylenol for cough She is taken Norel A-D She reports that    Past Medical History:  Diagnosis Date  . Diabetes mellitus without complication (HCC)   . Hyperlipemia     Current Outpatient Medications  Medication Sig Dispense Refill  . ACCU-CHEK FASTCLIX LANCETS MISC Use to check sugars 2-3 times daily 102 each 11  . atorvastatin (LIPITOR) 80 MG tablet Take 1 tablet (80 mg total) by mouth daily. 90 tablet 3  . azithromycin (ZITHROMAX Z-PAK) 250 MG tablet As directed 6 tablet 0  . benzonatate (TESSALON) 100 MG capsule Take 1-2 capsules (100-200 mg total) by mouth 3 (three) times daily as needed for cough. 40 capsule 0  . Blood Glucose Monitoring Suppl (ACCU-CHEK AVIVA) device Use as instructed to check sugar daily 1 each 0  . cetirizine (ZYRTEC) 10 MG tablet Take 10 mg by mouth daily.    . Cholecalciferol (VITAMIN D) 2000 units CAPS Take 1 capsule by mouth daily.    . cyclobenzaprine (FLEXERIL) 10 MG tablet Take 1 tablet (10 mg total) by mouth 3 (three) times daily as needed for muscle spasms. 90 tablet 0  . diclofenac sodium (VOLTAREN) 1 % GEL Apply 2 g topically 4 (four) times daily. 100 g 0  . ezetimibe (ZETIA) 10 MG tablet Take 1 tablet (10 mg total) by mouth daily. 90 tablet 3  . fluticasone (FLONASE) 50 MCG/ACT nasal spray Place 1 spray into both nostrils 2  (two) times daily.    Marland Kitchen glucose blood (ACCU-CHEK GUIDE) test strip Use as instructed 100 each 12  . insulin glargine (LANTUS) 100 UNIT/ML injection Inject 0.34 mLs (34 Units total) into the skin at bedtime. Pens please. 45 mL 3  . insulin lispro (HUMALOG) 100 UNIT/ML injection Inject under skin 12-15 units for meal. Pens. 45 mL 3  . Insulin Pen Needle 32G X 4 MM MISC Use to inject insulin 4 times daily 400 each 5  . lisinopril (PRINIVIL,ZESTRIL) 5 MG tablet TAKE 1 TABLET BY MOUTH ONCE DAILY 90 tablet 1  . metFORMIN (GLUCOPHAGE-XR) 500 MG 24 hr tablet Take 2 tablets (1,000 mg total) by mouth 2 (two) times daily with a meal. 360 tablet 3  . metoprolol succinate (TOPROL XL) 25 MG 24 hr tablet Take 1 tablet (25 mg total) by mouth daily. 90 tablet 3  . montelukast (SINGULAIR) 10 MG tablet Take 1 tablet (10 mg total) by mouth at bedtime. 90 tablet 3  . Omega-3 Fatty Acids (FISH OIL) 1000 MG CAPS Take 1 capsule by mouth 2 (two) times daily.    . pantoprazole (PROTONIX) 40 MG tablet TAKE 1 TABLET (40 MG TOTAL) BY MOUTH 2 (TWO) TIMES DAILY. 60 tablet 0  .  sitaGLIPtin (JANUVIA) 100 MG tablet Take 1 tablet (100 mg total) by mouth daily. 90 tablet 3  . SUMAtriptan (IMITREX) 25 MG tablet Take at the first sign of migraine. May repeat in 2 hours if headache persists or recurs. No more than 2 doses in a 24hr period. 10 tablet 3  . varenicline (CHANTIX STARTING MONTH PAK) 0.5 MG X 11 & 1 MG X 42 tablet TAKE AS DIRECTED BY MOUTH 53 tablet 0  . amitriptyline (ELAVIL) 100 MG tablet Take 1 tablet (100 mg total) by mouth at bedtime. (Patient not taking: Reported on 05/18/2017) 90 tablet 1  . HYDROcodone-acetaminophen (NORCO) 5-325 MG tablet Take 1 tablet by mouth every 6 (six) hours as needed for moderate pain. (Patient not taking: Reported on 05/18/2017) 30 tablet 0  . levofloxacin (LEVAQUIN) 500 MG tablet Take 1 tablet (500 mg total) by mouth daily. (Patient not taking: Reported on 05/18/2017) 7 tablet 0  . meloxicam  (MOBIC) 7.5 MG tablet Take 1 tablet (7.5 mg total) by mouth daily. (Patient not taking: Reported on 05/18/2017) 30 tablet 0  . varenicline (CHANTIX CONTINUING MONTH PAK) 1 MG tablet Take 1 tablet (1 mg total) by mouth 2 (two) times daily. (Patient not taking: Reported on 05/18/2017) 60 tablet 6   No current facility-administered medications for this visit.     Allergies:  Allergies  Allergen Reactions  . Gabapentin Other (See Comments)  . Levemir [Insulin Detemir] Dermatitis    Whelps at injection site  . Keflex [Cephalexin] Rash    Past Surgical History:  Procedure Laterality Date  . CHOLECYSTECTOMY    . ENDOMETRIAL ABLATION    . GALLBLADDER SURGERY    . TUBAL LIGATION      Social History   Socioeconomic History  . Marital status: Married    Spouse name: Molli Hazard  . Number of children: 1  . Years of education: None  . Highest education level: None  Social Needs  . Financial resource strain: None  . Food insecurity - worry: None  . Food insecurity - inability: None  . Transportation needs - medical: None  . Transportation needs - non-medical: None  Occupational History  . None  Tobacco Use  . Smoking status: Current Every Day Smoker    Packs/day: 0.50    Years: 20.00    Pack years: 10.00    Types: Cigarettes  . Smokeless tobacco: Never Used  Substance and Sexual Activity  . Alcohol use: No  . Drug use: No  . Sexual activity: Yes  Other Topics Concern  . None  Social History Narrative  . None    Family History  Problem Relation Age of Onset  . Cancer Mother   . Diabetes Father   . Liver disease Father   . Heart attack Maternal Grandmother   . Cancer Paternal Grandfather      ROS Review of Systems See HPI No malaise No diaphoresis Skin: No rash or itching Eyes: no blurry vision, no double vision GU: no dysuria or hematuria Neuro: no dizziness or headaches all others reviewed and negative   Objective: Vitals:   05/18/17 1303  BP: 114/64    Pulse: (!) 127  Resp: 20  Temp: 98.1 F (36.7 C)  TempSrc: Oral  SpO2: 97%  Weight: 292 lb 12.8 oz (132.8 kg)  Height: 5' 1.97" (1.574 m)    Physical Exam General: alert, oriented, in NAD Head: normocephalic, atraumatic, no sinus tenderness Eyes: EOM intact, no scleral icterus or conjunctival injection Ears: TM clear  bilaterally Nose: mucosa nonerythematous, nonedematous Throat: no pharyngeal exudate or erythema Lymph: no posterior auricular, submental or cervical lymph adenopathy Heart: normal rate, normal sinus rhythm, no murmurs Lungs: clear to auscultation bilaterally, no wheezing   Assessment and Plan [place] was seen today for cough, facial pain and fatigue.  Diagnoses and all orders for this visit:  Cough -     POCT Influenza A/B  Tachycardia  Acute URI  supportive care Discussed increased nasonex to bid Continue mucinex Avoid phenylephrine and dextromorphan  Zoe A Stallings

## 2017-05-18 NOTE — Patient Instructions (Addendum)
If you have thyroid disease, diabetes, hypertension, tachycardia or seizure disorder If you take heart meds or ADD meds   You should not take Dextromorphan or Phenylephrine These are common found in decongestant and cold medications This can cause very high pulses and high blood pressure.  Ask you pharmacist to make sure this ingredient is not present     IF you received an x-ray today, you will receive an invoice from [facility]. Please contact [facility] at [phone] with questions or concerns regarding your invoice.   IF you received labwork today, you will receive an invoice from [place]. Please contact [facility] at [phone] with questions or concerns regarding your invoice.   Our billing staff will not be able to assist you with questions regarding bills from these companies.  You will be contacted with the lab results as soon as they are available. The fastest way to get your results is to activate your My Chart account. Instructions are located on the last page of this paperwork. If you have not heard from [place] regarding the results in 2 weeks, please contact this office.     Sinus Headache A sinus headache occurs when the paranasal sinuses become clogged or swollen. Paranasal sinuses are air pockets within the bones of the face. Sinus headaches can range from mild to severe. What are the causes? A sinus headache can result from various conditions that affect the sinuses, such as:  Colds.  Sinus infections.  Allergies.  What are the signs or symptoms? The main symptom of this condition is a headache that may feel like pain or pressure in the face, forehead, ears, or upper teeth. People who have a sinus headache often have other symptoms, such as:  Congested or runny nose.  Fever.  Inability to smell.  Weather changes can make symptoms worse. How is this diagnosed? This condition may be diagnosed based on:  A physical exam and medical  history.  Imaging tests, such as a CT scan and MRI, to check for problems with the sinuses.  A specialist may look into the sinuses with a tool that has a camera (endoscopy).  How is this treated? Treatment for this condition depends on the cause.  Sinus pain that is caused by a sinus infection may be treated with antibiotic medicine.  Sinus pain that is caused by allergies may be helped by allergy medicines (antihistamines) and medicated nasal sprays.  Sinus pain that is caused by congestion may be helped by flushing the nose and sinuses with saline solution.  Follow these instructions at home:  Take medicines only as directed by your health care provider.  If you were prescribed an antibiotic medicine, finish all of it even if you start to feel better.  If you have congestion, use a nasal spray to help reduce pressure.  If directed, apply a warm, moist washcloth to your face to help relieve pain. Contact a health care provider if:  You have headaches more than one time each week.  You have sensitivity to light or sound.  You have a fever.  You feel sick to your stomach (nauseous) or you throw up (vomit).  Your headaches do not get better with treatment. Many people think that they have a sinus headache when they actually have migraines or tension headaches. Get help right away if:  You have vision problems.  You have sudden, severe pain in your face or head.  You have a seizure.  You are confused.  You have a  stiff neck. This information is not intended to replace advice given to you by your health care provider. Make sure you discuss any questions you have with your health care provider. Document Released: 05/12/2004 Document Revised: 11/29/2015 Document Reviewed: 03/31/2014 Elsevier Interactive Patient Education  [employer].

## 2017-05-19 ENCOUNTER — Other Ambulatory Visit: Payer: Self-pay | Admitting: Family Medicine

## 2017-05-19 MED FILL — PANTOPRAZOLE SOD DR 40 MG T: 40 | 30 days supply | Qty: 60 | Fill #0

## 2017-05-19 MED FILL — UNIFINE PENTIPS 32GX5/32": 32G X 4 MM | 90 days supply | Qty: 400 | Fill #1

## 2017-05-19 MED FILL — UNIFINE PENTIPS 32GX5/32: 32G X 4 MM | 90 days supply | Qty: 400 | Fill #1

## 2017-05-22 DIAGNOSIS — J209 Acute bronchitis, unspecified: Secondary | ICD-10-CM | POA: Diagnosis not present

## 2017-05-26 ENCOUNTER — Encounter: Payer: Self-pay | Admitting: Internal Medicine

## 2017-05-26 ENCOUNTER — Ambulatory Visit: Payer: 59 | Admitting: Internal Medicine

## 2017-05-26 VITALS — BP 128/82 | HR 114 | Ht 62.0 in | Wt 292.0 lb

## 2017-05-26 DIAGNOSIS — E785 Hyperlipidemia, unspecified: Secondary | ICD-10-CM

## 2017-05-26 DIAGNOSIS — Z794 Long term (current) use of insulin: Secondary | ICD-10-CM

## 2017-05-26 DIAGNOSIS — E1142 Type 2 diabetes mellitus with diabetic polyneuropathy: Secondary | ICD-10-CM

## 2017-05-26 LAB — POCT GLYCOSYLATED HEMOGLOBIN (HGB A1C): HEMOGLOBIN A1C: 8.5

## 2017-05-26 MED ORDER — INSULIN LISPRO 100 UNIT/ML ~~LOC~~ SOLN: SUBCUTANEOUS | 3 refills | Status: DC

## 2017-05-26 MED ORDER — METFORMIN HCL ER 500 MG PO TB24: 1000.0000 mg | ORAL_TABLET | Freq: Two times a day (BID) | ORAL | 3 refills | Status: DC

## 2017-05-26 MED ORDER — INSULIN GLARGINE 100 UNIT/ML ~~LOC~~ SOLN: 40.0000 [IU] | Freq: Every day | SUBCUTANEOUS | 3 refills | Status: DC

## 2017-05-26 MED ORDER — SITAGLIPTIN PHOSPHATE 100 MG PO TABS: 100.0000 mg | ORAL_TABLET | Freq: Every day | ORAL | 3 refills | Status: DC

## 2017-05-26 NOTE — Progress Notes (Signed)
Patient ID: Kelsey Vang, female   DOB: Sep 03, 1975, 42 y.o.   MRN: [id number]   HPI: Kelsey Vang is a 42 y.o.-year-old female, returning for f/u for DM2, dx in 2010, insulin-dependent since 01/2016, uncontrolled, without long term complications. She is the wife of Kelsey Vang, who is also my pt. Last visit 5 months ago.  Since last visit, she relaxed her diet, and she also had bronchitis, treated with different antibiotics.  She still has cough, but feeling much better.  Sugars are higher.  Last hemoglobin A1c was: Lab Results  Component Value Date   HGBA1C 7.2 12/23/2016   HGBA1C 8.0% 09/23/2016   HGBA1C 8.1 06/24/2016  02/26/2016: HbA1c 9.2%. Surprisingly, the fructosamine level checked at the same time was 271 (corresponding to the calculated  HbA1c of 6.2%, which, however, does not correlate with the sugars checked at home) 04/07/2015: HbA1c 7.1%  She is occasionally getting steroid injections.  Pt was on a regimen of: - Metformin ER 1000 mg 2x a day (had N/D/AP with regular metformin). - Januvia 100 mg before b'fast (but has a h/o gall stones pancreatitis in 1995 and had flares afterwards, despite cholecystectomy) - VGo 30 >> 40:  - smaller meal 4 >> 6 clicks - larger meal: 6 >> 8 clicks - snack: 1-2 clicks  [name] >> elevated Cr and Invokana >> yeast infection.  Now on: - Metformin ER 1000 mg 2x a day. - Januvia 100 mg before b'fast. - Lantus 34 units at bedtime - Humalog: 15-20 units 3x a day  Pt checks her sugars 4 times a day: - am: 109-139 >> 98, 117-175 >> 170-210 - 2h after b'fast: n/c  - before lunch:117-139 >> 100-151 >> n/c - 2h after lunch: n/c >> 123-164 >> n/c - before dinner: 117-147 >> 97, 136-169 >> 180-200 - 2h after dinner: 137-163 >> 118-185 >> 170-200s - bedtime: 136-176 >> 106-196, 218, 225 >> same as above - nighttime: n/c Lowest sugar was 97 >> 114. Highest sugar was 225 >> 265 (sick).  Glucometer: True Metric >> AccuCheck  Guide  Pt's meals are: - Breakfast: instant oatmeal or yoghurt + banana - Lunch: salad + meat  - Dinner: meat + 1-2 veggies, less bread - Snacks: trail mix  - No CKD, last BUN/creatinine:  Lab Results  Component Value Date   BUN 10 03/04/2017   BUN 12 11/10/2016   CREATININE 0.65 03/04/2017   CREATININE 0.76 11/10/2016  02/26/2016: Glucose 143, BUN/creatinine 22/0.63, EGFR 107 On lisinopril 5. - + HL; last set of lipids: Lab Results  Component Value Date   CHOL 156 06/25/2016   HDL 53 06/25/2016   LDLCALC 61 06/25/2016   TRIG 212 (H) 06/25/2016   CHOLHDL 2.9 06/25/2016   02/26/2016: 160/179/52/71 On Lipitor, ezetimibe. - last eye exam was  06/2016: No DR - No numbness and tingling in her feet. She was on Lyrica for carpal tunnel in hands and pain in feet from a previous injury. She had to stop this as she could not get it anymore, but has not noticed any neuropathic symptoms since she stopped.  ROS: Constitutional: no weight gain/no weight loss, + fatigue, + subjective hyperthermia, + subjective hypothermia Eyes: no blurry vision, no xerophthalmia ENT: + sore throat, no nodules palpated in throat, no dysphagia, no odynophagia, no hoarseness Cardiovascular: no CP/+ SOB/+ palpitations/no leg swelling Respiratory: + cough/+ SOB/no wheezing Gastrointestinal: + N/+ V/no D/no C/no acid reflux Musculoskeletal: + muscle aches/+ joint aches Skin: no rashes,  no hair loss Neurological: no tremors/no numbness/no tingling/no dizziness, + HA.  I reviewed pt's medications, allergies, PMH, social hx, family hx, and changes were documented in the history of present illness. Otherwise, unchanged from my initial visit note. Started Metoprolol 04/2017. On Doxycycline.   Past Medical History:  Diagnosis Date  . Diabetes mellitus without complication ([place])   . Hyperlipemia    Past Surgical History:  Procedure Laterality Date  . CHOLECYSTECTOMY    . ENDOMETRIAL ABLATION    . GALLBLADDER  SURGERY    . TUBAL LIGATION     Social History   Social History  . Marital status: Married    Spouse name: Kelsey Vang   . Number of children: 1   Occupational History  . CMA   Social History Main Topics  . Smoking status: Current Every Day Smoker    Packs/day: 1.00    Types: Cigarettes  . Smokeless tobacco: Never Used  . Alcohol use No  . Drug use: No   Current Outpatient Medications on File Prior to Visit  Medication Sig Dispense Refill  . ACCU-CHEK FASTCLIX LANCETS MISC Use to check sugars 2-3 times daily 102 each 11  . amitriptyline (ELAVIL) 100 MG tablet Take 1 tablet (100 mg total) by mouth at bedtime. (Patient not taking: Reported on 05/18/2017) 90 tablet 1  . atorvastatin (LIPITOR) 80 MG tablet Take 1 tablet (80 mg total) by mouth daily. 90 tablet 3  . azithromycin (ZITHROMAX Z-PAK) 250 MG tablet As directed 6 tablet 0  . benzonatate (TESSALON) 100 MG capsule Take 1-2 capsules (100-200 mg total) by mouth 3 (three) times daily as needed for cough. 40 capsule 0  . Blood Glucose Monitoring Suppl (ACCU-CHEK AVIVA) device Use as instructed to check sugar daily 1 each 0  . cetirizine (ZYRTEC) 10 MG tablet Take 10 mg by mouth daily.    . Cholecalciferol (VITAMIN D) 2000 units CAPS Take 1 capsule by mouth daily.    . cyclobenzaprine (FLEXERIL) 10 MG tablet Take 1 tablet (10 mg total) by mouth 3 (three) times daily as needed for muscle spasms. 90 tablet 0  . diclofenac sodium (VOLTAREN) 1 % GEL Apply 2 g topically 4 (four) times daily. 100 g 0  . ezetimibe (ZETIA) 10 MG tablet Take 1 tablet (10 mg total) by mouth daily. 90 tablet 3  . fluticasone (FLONASE) 50 MCG/ACT nasal spray Place 1 spray into both nostrils 2 (two) times daily.    Marland Kitchen glucose blood (ACCU-CHEK GUIDE) test strip Use as instructed 100 each 12  . HYDROcodone-acetaminophen (NORCO) 5-325 MG tablet Take 1 tablet by mouth every 6 (six) hours as needed for moderate pain. (Patient not taking: Reported on 05/18/2017) 30  tablet 0  . insulin glargine (LANTUS) 100 UNIT/ML injection Inject 0.34 mLs (34 Units total) into the skin at bedtime. Pens please. 45 mL 3  . insulin lispro (HUMALOG) 100 UNIT/ML injection Inject under skin 12-15 units for meal. Pens. 45 mL 3  . Insulin Pen Needle 32G X 4 MM MISC Use to inject insulin 4 times daily 400 each 5  . levofloxacin (LEVAQUIN) 500 MG tablet Take 1 tablet (500 mg total) by mouth daily. (Patient not taking: Reported on 05/18/2017) 7 tablet 0  . lisinopril (PRINIVIL,ZESTRIL) 5 MG tablet TAKE 1 TABLET BY MOUTH ONCE DAILY 90 tablet 1  . meloxicam (MOBIC) 7.5 MG tablet Take 1 tablet (7.5 mg total) by mouth daily. (Patient not taking: Reported on 05/18/2017) 30 tablet 0  . metFORMIN (GLUCOPHAGE-XR)  500 MG 24 hr tablet Take 2 tablets (1,000 mg total) by mouth 2 (two) times daily with a meal. 360 tablet 3  . metoprolol succinate (TOPROL XL) 25 MG 24 hr tablet Take 1 tablet (25 mg total) by mouth daily. 90 tablet 3  . montelukast (SINGULAIR) 10 MG tablet Take 1 tablet (10 mg total) by mouth at bedtime. 90 tablet 3  . Omega-3 Fatty Acids (FISH OIL) 1000 MG CAPS Take 1 capsule by mouth 2 (two) times daily.    . pantoprazole (PROTONIX) 40 MG tablet TAKE 1 TABLET (40 MG TOTAL) BY MOUTH TWICE A DAY 60 tablet 0  . sitaGLIPtin (JANUVIA) 100 MG tablet Take 1 tablet (100 mg total) by mouth daily. 90 tablet 3  . SUMAtriptan (IMITREX) 25 MG tablet Take at the first sign of migraine. May repeat in 2 hours if headache persists or recurs. No more than 2 doses in a 24hr period. 10 tablet 3  . varenicline (CHANTIX CONTINUING MONTH PAK) 1 MG tablet Take 1 tablet (1 mg total) by mouth 2 (two) times daily. (Patient not taking: Reported on 05/18/2017) 60 tablet 6  . varenicline (CHANTIX STARTING MONTH PAK) 0.5 MG X 11 & 1 MG X 42 tablet TAKE AS DIRECTED BY MOUTH 53 tablet 0   No current facility-administered medications on file prior to visit.    Allergies  Allergen Reactions  . Gabapentin Other (See  Comments)  . Levemir [Insulin Detemir] Dermatitis    Whelps at injection site  . Keflex [Cephalexin] Rash   Family History  Problem Relation Age of Onset  . Cancer Mother   . Diabetes Father   . Liver disease Father   . Heart attack Maternal Grandmother   . Cancer Paternal Grandfather    PE: BP 128/82   Pulse (!) 114   Ht '5\' 2"'  (1.575 m)   Wt 292 lb (132.5 kg)   LMP 05/10/2017 (Exact Date)   SpO2 95%   BMI 53.41 kg/m  Wt Readings from Last 3 Encounters:  05/26/17 292 lb (132.5 kg)  05/18/17 292 lb 12.8 oz (132.8 kg)  04/03/17 290 lb 9.6 oz (131.8 kg)   Constitutional: overweight, in NAD Eyes: PERRLA, EOMI, no exophthalmos ENT: moist mucous membranes, no thyromegaly, no cervical lymphadenopathy Cardiovascular: tachycardia, RR, No MRG Respiratory: CTA B Gastrointestinal: abdomen soft, NT, ND, BS+ Musculoskeletal: no deformities, strength intact in all 4 Skin: moist, warm, no rashes Neurological: no tremor with outstretched hands, DTR normal in all 4  ASSESSMENT: 1. DM2, insulin-dependent, uncontrolled, without long term complications, but with hypoglycemia - We discussed about GLP-1 receptor agonist, however, we cannot use these due to her history of pancreatitis.  2. HL   PLAN:  1. Patient with long-standing, uncontrolled, diabetes, on oral antidiabetic regimen and basal-bolus insulin regimen.  Previously on the VGo 40 patch pump.  However, the pump was coming off so she had to stop.  She feels like her diabetes control has improved after switching to the current regimen.  At last visit, as sugars were higher than target, I suggested to increase Lantus to 34 units daily.  She continues on this dose.  However, her sugars  increased during the holidays and then with her bronchitis episode, therefore, she needed to increase her Humalog insulin.  She is taking now 15-20 units before each meal.  We will continue this is, but will also increase Lantus, since all of her sugars are  above goal.  I also suggested to split metformin in  3 doses since she was complaining of nausea, most likely from antibiotics - At last visit, we also discussed about the freestyle libre CGM.  She did not obtain this.  She is in the [employer] and using their meter. - I suggested to:  Patient Instructions  Please change: - Metformin 500 mg 3-4x a day with meals  Continue: - Januvia 100 mg before b'fast. - Humalog: 15-20 units 3x a day  Increase: - Lantus to 40 units at bedtime  Please return in 3 months with your sugar log.   - today, HbA1c is 8.5% (higher) - continue checking sugars at different times of the day - check 3x a day, rotating checks - advised for yearly eye exams >> she is UTD - Return to clinic in 3 mo with sugar log   2. HL -Reviewed latest lipid panel from 06/2016: LDL at goal but elevated triglycerides. -Continues on Lipitor and ezetimibe without side effects - She is due for another panel.  She has an appointment with PCP in 2 months  Philemon Kingdom, MD PhD [facility]

## 2017-05-26 NOTE — Patient Instructions (Signed)
Please change: - Metformin 500 mg 3-4x a day with meals  Continue: - Januvia 100 mg before b'fast. - Humalog: 15-20 units 3x a day  Increase: - Lantus to 40 units at bedtime  Please return in 3 months with your sugar log.

## 2017-05-26 NOTE — Addendum Note (Signed)
Addended by: Ann Maki T on: 05/26/2017 10:05 AM   Modules accepted: Orders

## 2017-06-01 MED FILL — HUMALOG 100 UNITS/ML KWIKPE: 100 | 90 days supply | Qty: 42 | Fill #1

## 2017-06-05 ENCOUNTER — Ambulatory Visit: Payer: 59 | Admitting: Cardiovascular Disease

## 2017-06-12 MED FILL — CHANTIX 1 MG CONT MONTH BOX: 1 | 28 days supply | Qty: 56 | Fill #1

## 2017-06-12 MED FILL — SUMATRIPTAN SUCC 25 MG TAB: 25 | 20 days supply | Qty: 10 | Fill #2

## 2017-06-20 ENCOUNTER — Other Ambulatory Visit: Payer: Self-pay | Admitting: Family Medicine

## 2017-06-20 MED FILL — JANUVIA 100 MG TABLET: 100 | 90 days supply | Qty: 90 | Fill #0

## 2017-06-20 MED FILL — MONTELUKAST SOD 10 MG TAB: 10 | 90 days supply | Qty: 90 | Fill #3

## 2017-06-20 MED FILL — LISINOPRIL 5 MG TABLET: 5 | 90 days supply | Qty: 90 | Fill #1

## 2017-06-20 MED FILL — METFORMIN HCL ER 500 MG TAB: 500 | 90 days supply | Qty: 360 | Fill #0

## 2017-06-23 ENCOUNTER — Telehealth: Payer: 59 | Admitting: Family

## 2017-06-23 DIAGNOSIS — N390 Urinary tract infection, site not specified: Secondary | ICD-10-CM

## 2017-06-23 MED ORDER — NITROFURANTOIN MONOHYD MACRO 100 MG PO CAPS: 100.0000 mg | ORAL_CAPSULE | Freq: Two times a day (BID) | ORAL | 0 refills | Status: DC

## 2017-06-23 NOTE — Progress Notes (Signed)

## 2017-06-30 ENCOUNTER — Ambulatory Visit: Payer: 59 | Admitting: Neurology

## 2017-06-30 ENCOUNTER — Encounter: Payer: Self-pay | Admitting: Neurology

## 2017-06-30 VITALS — BP 106/66 | HR 118 | Resp 16 | Ht 62.0 in | Wt 296.0 lb

## 2017-06-30 DIAGNOSIS — G4733 Obstructive sleep apnea (adult) (pediatric): Secondary | ICD-10-CM | POA: Diagnosis not present

## 2017-06-30 DIAGNOSIS — F17200 Nicotine dependence, unspecified, uncomplicated: Secondary | ICD-10-CM | POA: Diagnosis not present

## 2017-06-30 DIAGNOSIS — G43709 Chronic migraine without aura, not intractable, without status migrainosus: Secondary | ICD-10-CM | POA: Diagnosis not present

## 2017-06-30 MED ORDER — AMITRIPTYLINE HCL 25 MG PO TABS: 25.0000 mg | ORAL_TABLET | Freq: Every day | ORAL | 0 refills | Status: DC

## 2017-06-30 MED ORDER — SUMATRIPTAN SUCCINATE 100 MG PO TABS: ORAL_TABLET | ORAL | 2 refills | Status: DC

## 2017-06-30 MED FILL — AMITRIPTYLINE HCL 25 MG TAB: 25 | 7 days supply | Qty: 7 | Fill #0

## 2017-06-30 MED FILL — SUMATRIPTAN SUCC 100 MG TAB: 100 | 30 days supply | Qty: 8 | Fill #0

## 2017-06-30 NOTE — Patient Instructions (Signed)
Migraine Recommendations: 1.  We will start you on Ajovy 2.  Take sumatriptan 100mg  at earliest onset of headache.  May repeat dose once in 2 hours if needed.  Do not exceed two tablets in 24 hours. 3.  Limit use of pain relievers to no more than 2 days out of the week.  These medications include acetaminophen, ibuprofen, triptans and narcotics.  This will help reduce risk of rebound headaches. 4.  Be aware of common food triggers such as processed sweets, processed foods with nitrites (such as deli meat, hot dogs, sausages), foods with MSG, alcohol (such as wine), chocolate, certain cheeses, certain fruits (dried fruits, bananas, some citrus fruit), vinegar, diet soda. 4.  Avoid caffeine 5.  Routine exercise 6.  Proper sleep hygiene 7.  Stay adequately hydrated with water 8.  Keep a headache diary. 9.  Maintain proper stress management. 10.  Do not skip meals.  Work on diet. 11.  Consider supplements:  Magnesium citrate 400mg  to 600mg  daily, riboflavin 400mg , Coenzyme Q 10 100mg  three times daily 12.  Continue working on quitting smoking 13.  Follow up in 3 months.

## 2017-06-30 NOTE — Progress Notes (Signed)
NEUROLOGY CONSULTATION NOTE  Kelsey Vang MRN: [id number] DOB: [birth year]  Referring provider: Dr. Creta Levin Primary care provider: Dr. Creta Levin  Reason for consult:  migraine  HISTORY OF PRESENT ILLNESS: Kelsey Vang is a 42 year old female with diabetes and hyperlipidemia who presents for migraines.  History supplemented by PCP note.  Onset:  Since late teens.  Increased frequency for past year. Location:  diffuse Quality:  "lightening bolt" Intensity:  Severe..  Sometimes wakes her from sleep.  She denies new headache. Aura:  Occasionally may see flashing lights when her eyes are closed.  Difficult to focus Prodrome:  no Postdrome:  tired Associated symptoms:  Nausea, vomiting, photophobia, phonophobia, osmophobia, blurred vision.  No autonomic symptoms.  She denies associated unilateral numbness or weakness. Duration:  With treatment, 2 hours severe but less severe for entire day (sometimes 2 days). Frequency:  1 to 2 days a week but has constant dull left frontal/top headache Frequency of abortive medication: ibuprofen 3 days a week.  Sumatriptan 1 to 2 days a week. Triggers/exacerbating factors:  no Relieving factors:  rest Activity:  Cannot function at all once every few months.  Current NSAIDS:  ibuprofen Current analgesics:  no Current triptans:  sumatriptan 25mg  Current anti-emetic:  no Current muscle relaxants:  cyclobenzaprine 10mg  Current anti-anxiolytic:  no Current sleep aide:  no Current Antihypertensive medications:  Metoprolol, lisinopril Current Antidepressant medications:  amitriptyline 100mg  (however, it has caused her tachycardia) Current Anticonvulsant medications:  no Current Vitamins/Herbal/Supplements:  no Current Antihistamines/Decongestants:  Flonase, Zyrtec Other therapy:  no  Past NSAIDS:  no Past analgesics:  Tylenol, Fioricet Past abortive triptans:  Maxalt Past muscle relaxants:  no Past anti-emetic:  no Past antihypertensive  medications:  no Past antidepressant medications:  no Past anticonvulsant medications:  Lyrica, topiramate (hallucinations), gabapentin (hallucinations) Past vitamins/Herbal/Supplements:  no Past antihistamines/decongestants:  no Other past therapies:  no  Caffeine:  Mt Dew daily Alcohol:  no Smoker:  no Diet:  Hydrates.  Does not skip meals Exercise:  walks Depression:  No; Anxiety:  No Other pain:  No Sleep hygiene:  Poor.  Has OSA and compliant with CPAP Family history of headache:  mother  03/04/17 LABS:  CBC with WBC 13.1, HGB 14.5, HCT 44.3, PLT 327; CMP with Na 141, K 4.8, Cl 103, CO2 24, glucose 162, BUN 10, Cr 0.65, t bili 0.3, ALP 102, AST 35, ALT 45.  PAST MEDICAL HISTORY: Past Medical History:  Diagnosis Date  . Diabetes mellitus without complication ([facility])   . Hyperlipemia     PAST SURGICAL HISTORY: Past Surgical History:  Procedure Laterality Date  . CHOLECYSTECTOMY    . ENDOMETRIAL ABLATION    . GALLBLADDER SURGERY    . TUBAL LIGATION      MEDICATIONS: Current Outpatient Medications on File Prior to Visit  Medication Sig Dispense Refill  . ACCU-CHEK FASTCLIX LANCETS MISC Use to check sugars 2-3 times daily 102 each 11  . amitriptyline (ELAVIL) 100 MG tablet Take 1 tablet (100 mg total) by mouth at bedtime. 90 tablet 1  . atorvastatin (LIPITOR) 80 MG tablet Take 80 mg by mouth daily.    . cetirizine (ZYRTEC) 10 MG tablet Take 10 mg by mouth daily.    . Cholecalciferol (VITAMIN D) 2000 units CAPS Take 1 capsule by mouth daily.    . cyclobenzaprine (FLEXERIL) 10 MG tablet Take 1 tablet (10 mg total) by mouth 3 (three) times daily as needed for muscle spasms. 90 tablet 0  .  diclofenac sodium (VOLTAREN) 1 % GEL Apply 2 g topically 4 (four) times daily. 100 g 0  . ezetimibe (ZETIA) 10 MG tablet Take 1 tablet (10 mg total) by mouth daily. 90 tablet 3  . fluticasone (FLONASE) 50 MCG/ACT nasal spray Place 1 spray into both nostrils 2 (two) times daily.    Marland Kitchen glucose  blood (ACCU-CHEK GUIDE) test strip Use as instructed 100 each 12  . insulin glargine (LANTUS) 100 UNIT/ML injection Inject 0.4 mLs (40 Units total) into the skin at bedtime. Pens please. 45 mL 3  . insulin lispro (HUMALOG) 100 UNIT/ML injection Inject under skin 15-20  units for meal. Pens. 45 mL 3  . Insulin Pen Needle 32G X 4 MM MISC Use to inject insulin 4 times daily 400 each 5  . lisinopril (PRINIVIL,ZESTRIL) 5 MG tablet TAKE 1 TABLET BY MOUTH ONCE DAILY 90 tablet 1  . metFORMIN (GLUCOPHAGE-XR) 500 MG 24 hr tablet Take 2 tablets (1,000 mg total) by mouth 2 (two) times daily with a meal. 360 tablet 3  . metoprolol succinate (TOPROL XL) 25 MG 24 hr tablet Take 1 tablet (25 mg total) by mouth daily. 90 tablet 3  . montelukast (SINGULAIR) 10 MG tablet Take 1 tablet (10 mg total) by mouth at bedtime. 90 tablet 3  . Omega-3 Fatty Acids (FISH OIL) 1000 MG CAPS Take 1 capsule by mouth 2 (two) times daily.    . pantoprazole (PROTONIX) 40 MG tablet TAKE 1 TABLET BY MOUTH TWICE A DAY 60 tablet 0  . sitaGLIPtin (JANUVIA) 100 MG tablet Take 1 tablet (100 mg total) by mouth daily. 90 tablet 3  . varenicline (CHANTIX STARTING MONTH PAK) 0.5 MG X 11 & 1 MG X 42 tablet TAKE AS DIRECTED BY MOUTH 53 tablet 0  . benzonatate (TESSALON) 100 MG capsule Take 1-2 capsules (100-200 mg total) by mouth 3 (three) times daily as needed for cough. (Patient not taking: Reported on 06/30/2017) 40 capsule 0   No current facility-administered medications on file prior to visit.     ALLERGIES: Allergies  Allergen Reactions  . Gabapentin Other (See Comments)  . Levemir [Insulin Detemir] Dermatitis    Whelps at injection site  . Keflex [Cephalexin] Rash    FAMILY HISTORY: Family History  Problem Relation Age of Onset  . Cancer Mother   . Diabetes Father   . Liver disease Father   . Heart attack Maternal Grandmother   . Cancer Paternal Grandfather     SOCIAL HISTORY: Social History   Socioeconomic History  .  Marital status: Married    Spouse name: Molli Hazard  . Number of children: 1  . Years of education: Not on file  . Highest education level: Not on file  Social Needs  . Financial resource strain: Not on file  . Food insecurity - worry: Not on file  . Food insecurity - inability: Not on file  . Transportation needs - medical: Not on file  . Transportation needs - non-medical: Not on file  Occupational History  . Not on file  Tobacco Use  . Smoking status: Current Every Day Smoker    Packs/day: 0.50    Years: 20.00    Pack years: 10.00    Types: Cigarettes  . Smokeless tobacco: Never Used  Substance and Sexual Activity  . Alcohol use: No  . Drug use: No  . Sexual activity: Yes  Other Topics Concern  . Not on file  Social History Narrative  . Not on file  REVIEW OF SYSTEMS: Constitutional: No fevers, chills, or sweats, no generalized fatigue, change in appetite Eyes: No visual changes, double vision, eye pain Ear, nose and throat: No hearing loss, ear pain, nasal congestion, sore throat Cardiovascular: No chest pain, palpitations Respiratory:  No shortness of breath at rest or with exertion, wheezes GastrointestinaI: No nausea, vomiting, diarrhea, abdominal pain, fecal incontinence Genitourinary:  No dysuria, urinary retention or frequency Musculoskeletal:  No neck pain, back pain Integumentary: No rash, pruritus, skin lesions Neurological: as above Psychiatric: No depression, insomnia, anxiety Endocrine: No palpitations, fatigue, diaphoresis, mood swings, change in appetite, change in weight, increased thirst Hematologic/Lymphatic:  No purpura, petechiae. Allergic/Immunologic: no itchy/runny eyes, nasal congestion, recent allergic reactions, rashes  PHYSICAL EXAM: Vitals:   06/30/17 0742  BP: 106/66  Pulse: (!) 118  Resp: 16  SpO2: 98%   General: No acute distress.  Patient appears well-groomed.  Head:  Normocephalic/atraumatic Eyes:  fundi examined but not  visualized Neck: supple, no paraspinal tenderness, full range of motion Back: No paraspinal tenderness Heart: regular rate and rhythm Lungs: Clear to auscultation bilaterally. Vascular: No carotid bruits. Neurological Exam: Mental status: alert and oriented to person, place, and time, recent and remote memory intact, fund of knowledge intact, attention and concentration intact, speech fluent and not dysarthric, language intact. Cranial nerves: CN I: not tested CN II: pupils equal, round and reactive to light, visual fields intact CN III, IV, VI:  full range of motion, no nystagmus, no ptosis CN V: facial sensation intact CN VII: upper and lower face symmetric CN VIII: hearing intact CN IX, X: gag intact, uvula midline CN XI: sternocleidomastoid and trapezius muscles intact CN XII: tongue midline Bulk & Tone: normal, no fasciculations. Motor:  5/5 throughout  Sensation: temperature and vibration sensation intact. Deep Tendon Reflexes:  2+ throughout, toes downgoing.  Finger to nose testing:  Without dysmetria.  Heel to shin:  Without dysmetria.  Gait:  Normal station and stride.  Able to turn and tandem walk. Romberg negative.  IMPRESSION: Chronic migraine without aura  PLAN: 1.  As amitriptyline is ineffective and causing tachycardia, taper off. 2.  Instead, we will try Ajovy. 3.  Increase sumatriptan to 100mg  4.  Limit use of pain relievers to no more than 2 days out of week. 5.  Stop Mt Dew 6.  Exercise, continue to hydrate 7.  Sleep hygiene instructions reviewed and provided 8.  Diet/weight loss 9.  Headache diary 10.  Consider Mg, B2, CoQ10 11.  Follow up in 3 months.  Tobacco cessation counseling (CPT 99406):  Tobacco use No history of CAD, stroke, or cancer  - Currently smoking 4-5 cigarettes/day.  - Patient was informed of the dangers of tobacco abuse including stroke, cancer, and MI, as well as benefits of tobacco cessation.  Using Chantix - Patient is willing  to quit at this time. - Approximately 4 mins were spent counseling patient cessation techniques. We discussed various methods to help quit smoking, including deciding on a date to quit, joining a support group, pharmacological agents- nicotine gum/patch/lozenges, chantix.  - I will reassess her progress at the next follow-up visit  Thank you for allowing me to take part in the care of this patient.  Shon Millet, DO  CC: Collie Siad, MD

## 2017-07-06 ENCOUNTER — Telehealth: Payer: Self-pay

## 2017-07-06 NOTE — Telephone Encounter (Signed)
Pa for Ajovy was denied through [email or web address], and after contacting [employer] at [phone] spoke with pharmacy for them and filed appeal stating patient had taken Amitriptyline in the past causing her to have tachycardia. Confirmation number [id number], PA reference # [id number]. Awaiting approval

## 2017-07-10 ENCOUNTER — Encounter: Payer: Self-pay | Admitting: Neurology

## 2017-07-12 MED FILL — AJOVY 225 MG/1.5ML SOSY: 225 | 30 days supply | Qty: 2 | Fill #0

## 2017-07-12 NOTE — Progress Notes (Addendum)
Rcvd approval for Ajovy valid 07/11/17-01/10/18.Called [facility], spoke with Pieter Partridge. Esaw Dace apprvd, she did not have Rx, gave verbal.

## 2017-07-12 NOTE — Progress Notes (Signed)
Rcvd fax from [employer], advising [place] expedited appeal (case id# [id number]) had been rcvd and determination will be made within 72 hours. They can be reached at [phone]

## 2017-07-15 DIAGNOSIS — H52223 Regular astigmatism, bilateral: Secondary | ICD-10-CM | POA: Diagnosis not present

## 2017-07-19 MED FILL — CHANTIX 1 MG CONT MONTH BOX: 1 | 28 days supply | Qty: 56 | Fill #2

## 2017-07-19 MED FILL — LANTUS SOLOSTAR 100 UNITS/M: 100 | 44 days supply | Qty: 15 | Fill #3

## 2017-07-26 ENCOUNTER — Telehealth: Payer: 59 | Admitting: Nurse Practitioner

## 2017-07-26 ENCOUNTER — Encounter: Payer: Self-pay | Admitting: Physician Assistant

## 2017-07-26 DIAGNOSIS — N3000 Acute cystitis without hematuria: Secondary | ICD-10-CM

## 2017-07-26 MED ORDER — CIPROFLOXACIN HCL 500 MG PO TABS: 500.0000 mg | ORAL_TABLET | Freq: Two times a day (BID) | ORAL | 0 refills | Status: DC

## 2017-07-26 MED FILL — CIPROFLOXACIN HCL 500 MG TA: 500 | 5 days supply | Qty: 10 | Fill #0

## 2017-07-26 NOTE — Progress Notes (Signed)

## 2017-08-07 DIAGNOSIS — G4733 Obstructive sleep apnea (adult) (pediatric): Secondary | ICD-10-CM | POA: Diagnosis not present

## 2017-08-11 ENCOUNTER — Telehealth: Payer: Self-pay | Admitting: Family Medicine

## 2017-08-11 ENCOUNTER — Other Ambulatory Visit: Payer: Self-pay | Admitting: Family Medicine

## 2017-08-11 MED FILL — METOPROLOL SUCCINATE ER 25: 25 | 90 days supply | Qty: 90 | Fill #1

## 2017-08-11 MED FILL — AJOVY 225 MG/1.5ML SOSY: 225 | 30 days supply | Qty: 2 | Fill #1

## 2017-08-11 MED FILL — SUMATRIPTAN SUCC 100 MG TAB: 100 | 30 days supply | Qty: 9 | Fill #1

## 2017-08-11 NOTE — Telephone Encounter (Signed)
Copied from CRM [id number]. Topic: Quick Communication - Rx Refill/Question >> Aug 11, 2017 10:46 AM Alexander Bergeron B wrote: Medication: atorvastatin (LIPITOR) 80 MG tablet [[id number]] , ezetimibe (ZETIA) 10 MG tablet [[id number]] , pantoprazole (PROTONIX) 40 MG tablet [[id number]]  Has the patient contacted their pharmacy? Yes.   (Agent: If no, request that the patient contact the pharmacy for the refill.) Preferred Pharmacy (with phone number or street name): [facility] Agent: Please be advised that RX refills may take up to 3 business days. We ask that you follow-up with your pharmacy.

## 2017-08-11 NOTE — Telephone Encounter (Signed)
Date of request:08/11/17, pt requesting Protonix 40 mg, 90d. Last filled on 06/20/17. Last visit 05/18/17. No follow up vs on file.

## 2017-08-14 ENCOUNTER — Encounter: Payer: Self-pay | Admitting: Neurology

## 2017-08-14 MED FILL — HUMALOG 100 UNITS/ML KWIKPE: 100 | 90 days supply | Qty: 42 | Fill #2

## 2017-08-18 MED ORDER — PANTOPRAZOLE SODIUM 40 MG PO TBEC: 40.0000 mg | DELAYED_RELEASE_TABLET | Freq: Two times a day (BID) | ORAL | 1 refills | Status: DC

## 2017-08-18 MED FILL — PANTOPRAZOLE SOD DR 40 MG T: 40 | 90 days supply | Qty: 180 | Fill #0

## 2017-08-23 NOTE — Telephone Encounter (Signed)
Pt calling back because she did not get the  atorvastatin (LIPITOR) 80 MG tablet  ezetimibe (ZETIA) 10 MG tablet  Pt states Dr Creta Levin is the one that has filled these rx.  [facility] - [place], [place] - [address] [phone] (Phone) [phone] (Fax)   Pt made her CPE for 09/09/17.

## 2017-08-23 NOTE — Telephone Encounter (Signed)
No follow up or lipid panel in over a year. Medication denied. LVM advising pt.

## 2017-08-25 ENCOUNTER — Encounter: Payer: Self-pay | Admitting: Internal Medicine

## 2017-08-25 ENCOUNTER — Ambulatory Visit: Payer: 59 | Admitting: Internal Medicine

## 2017-08-25 VITALS — BP 110/60 | HR 107 | Ht 61.5 in | Wt 297.0 lb

## 2017-08-25 DIAGNOSIS — E785 Hyperlipidemia, unspecified: Secondary | ICD-10-CM

## 2017-08-25 DIAGNOSIS — Z794 Long term (current) use of insulin: Secondary | ICD-10-CM | POA: Diagnosis not present

## 2017-08-25 DIAGNOSIS — E1142 Type 2 diabetes mellitus with diabetic polyneuropathy: Secondary | ICD-10-CM

## 2017-08-25 DIAGNOSIS — E669 Obesity, unspecified: Secondary | ICD-10-CM

## 2017-08-25 LAB — POCT GLYCOSYLATED HEMOGLOBIN (HGB A1C): HEMOGLOBIN A1C: 7.9

## 2017-08-25 MED ORDER — INSULIN LISPRO 100 UNIT/ML ~~LOC~~ SOLN: SUBCUTANEOUS | 3 refills | Status: DC

## 2017-08-25 NOTE — Patient Instructions (Addendum)
Please continue: - Metformin 500 mg 3-4x a day with meals - Januvia 100 mg before b'fast. - Lantus 40 units at bedtime  Please increase Humalog to 15-25 units before meals.  Please return in 3 months with your sugar log.

## 2017-08-25 NOTE — Progress Notes (Signed)
Patient ID: Kelsey Vang, female   DOB: 12/19/1975, 41 y.o.   MRN: [id number]   HPI: Kelsey Vang is a 41 y.o.-year-old female, returning for f/u for DM2, dx in 2010, insulin-dependent since 01/2016, uncontrolled, without long term complications. She is the wife of Matthew Higgins, who is also my pt. Last visit 3 months ago.  Last hemoglobin A1c was: Lab Results  Component Value Date   HGBA1C 8.5 05/26/2017   HGBA1C 7.2 12/23/2016   HGBA1C 8.0% 09/23/2016  02/26/2016: HbA1c 9.2%. Surprisingly, the fructosamine level checked at the same time was 271 (corresponding to the calculated  HbA1c of 6.2%, which, however, does not correlate with the sugars checked at home) 04/07/2015: HbA1c 7.1%  She is occasionally getting steroid injections.  Pt was on a regimen of: - Metformin ER 1000 mg 2x a day (had N/D/AP with regular metformin). - Januvia 100 mg before b'fast (but has a h/o gall stones pancreatitis in 1995 and had flares afterwards, despite cholecystectomy) - VGo 30 >> 40:  - smaller meal 4 >> 6 clicks - larger meal: 6 >> 8 clicks - snack: 1-2 clicks  Tried Farxiga >> elevated Cr and Invokana >> yeast infection.  Now on: - Metformin 500 mg 3-4x a day with meals - Januvia 100 mg before b'fast. - Humalog: 15-20 units 3x a day - Lantus 34 >> 40 units at bedtime  Pt checks her sugars 4x a day: - am: 109-139 >> 98, 117-175 >> 170-210 >> 106-138 - 2h after b'fast: n/c  - before lunch:117-139 >> 100-151 >> n/c - 2h after lunch: n/c >> 123-164 >> n/c - before dinner: 117-147 >> 97, 136-169 >> 180-200 >> n/c - 2h after dinner: 118-185 >> 170-200s >> 148-201 - bedtime:  106-196, 218, 225 >> same as above - nighttime: n/c Lowest sugar was 97 >> 114 >> 106. Highest sugar was 225 >> 265 (sick) >> 201.  Glucometer: True Metric >> AccuCheck Guide  Pt's meals are: - Breakfast: instant oatmeal or yoghurt + banana - Lunch: salad + meat  - Dinner: meat + 1-2 veggies, less bread -  Snacks: trail mix  -No CKD, last BUN/creatinine:  Lab Results  Component Value Date   BUN 10 03/04/2017   BUN 12 11/10/2016   CREATININE 0.65 03/04/2017   CREATININE 0.76 11/10/2016  02/26/2016: Glucose 143, BUN/creatinine 22/0.63, EGFR 107 On lisinopril 5. - + HL; last set of lipids: Lab Results  Component Value Date   CHOL 156 06/25/2016   HDL 53 06/25/2016   LDLCALC 61 06/25/2016   TRIG 212 (H) 06/25/2016   CHOLHDL 2.9 06/25/2016   02/26/2016: 160/179/52/71 On Lipitor, ezetimibe. - last eye exam was  06/2017: No DR - No numbness and tingling in her feet.  She was on Lyrica for carpal tunnel in hands and pain in feet from a previous injury.  She had to stop this as she could not get it anymore, but has not noticed any neuropathic symptoms since she stopped.  ROS: Constitutional: no weight gain/no weight loss, + fatigue, no subjective hyperthermia, no subjective hypothermia, + nocturia Eyes: no blurry vision, no xerophthalmia ENT: no sore throat, no nodules palpated in throat, no dysphagia, no odynophagia, no hoarseness Cardiovascular: no CP/no SOB/no palpitations/no leg swelling Respiratory: no cough/no SOB/no wheezing Gastrointestinal: no N/no V/no D/no C/no acid reflux Musculoskeletal: + Muscle aches/+ joint aches Skin: no rashes, no hair loss Neurological: no tremors/no numbness/no tingling/no dizziness  I reviewed pt's medications, allergies, PMH, social hx,   family hx, and changes were documented in the history of present illness. Otherwise, unchanged from my initial visit note. She started Ajovy monthly for migraines >> working well.  She also started magnesium and CoQ10 for headaches.  Past Medical History:  Diagnosis Date  . Diabetes mellitus without complication (HCC)   . Hyperlipemia    Past Surgical History:  Procedure Laterality Date  . CHOLECYSTECTOMY    . ENDOMETRIAL ABLATION    . GALLBLADDER SURGERY    . TUBAL LIGATION     Social History   Social  History  . Marital status: Married    Spouse name: Matthew Higgins   . Number of children: 1   Occupational History  . CMA   Social History Main Topics  . Smoking status: Current Every Day Smoker    Packs/day: 1.00    Types: Cigarettes  . Smokeless tobacco: Never Used  . Alcohol use No  . Drug use: No   Current Outpatient Medications on File Prior to Visit  Medication Sig Dispense Refill  . ACCU-CHEK FASTCLIX LANCETS MISC Use to check sugars 2-3 times daily 102 each 11  . amitriptyline (ELAVIL) 25 MG tablet Take 1 tablet (25 mg total) by mouth at bedtime. 7 tablet 0  . atorvastatin (LIPITOR) 80 MG tablet Take 80 mg by mouth daily.    . benzonatate (TESSALON) 100 MG capsule Take 1-2 capsules (100-200 mg total) by mouth 3 (three) times daily as needed for cough. (Patient not taking: Reported on 06/30/2017) 40 capsule 0  . cetirizine (ZYRTEC) 10 MG tablet Take 10 mg by mouth daily.    . Cholecalciferol (VITAMIN D) 2000 units CAPS Take 1 capsule by mouth daily.    . ciprofloxacin (CIPRO) 500 MG tablet Take 1 tablet (500 mg total) by mouth 2 (two) times daily. 10 tablet 0  . cyclobenzaprine (FLEXERIL) 10 MG tablet Take 1 tablet (10 mg total) by mouth 3 (three) times daily as needed for muscle spasms. 90 tablet 0  . diclofenac sodium (VOLTAREN) 1 % GEL Apply 2 g topically 4 (four) times daily. 100 g 0  . ezetimibe (ZETIA) 10 MG tablet Take 1 tablet (10 mg total) by mouth daily. 90 tablet 3  . fluticasone (FLONASE) 50 MCG/ACT nasal spray Place 1 spray into both nostrils 2 (two) times daily.    . glucose blood (ACCU-CHEK GUIDE) test strip Use as instructed 100 each 12  . insulin glargine (LANTUS) 100 UNIT/ML injection Inject 0.4 mLs (40 Units total) into the skin at bedtime. Pens please. 45 mL 3  . insulin lispro (HUMALOG) 100 UNIT/ML injection Inject under skin 15-20  units for meal. Pens. 45 mL 3  . Insulin Pen Needle 32G X 4 MM MISC Use to inject insulin 4 times daily 400 each 5  .  lisinopril (PRINIVIL,ZESTRIL) 5 MG tablet TAKE 1 TABLET BY MOUTH ONCE DAILY 90 tablet 1  . metFORMIN (GLUCOPHAGE-XR) 500 MG 24 hr tablet Take 2 tablets (1,000 mg total) by mouth 2 (two) times daily with a meal. 360 tablet 3  . metoprolol succinate (TOPROL XL) 25 MG 24 hr tablet Take 1 tablet (25 mg total) by mouth daily. 90 tablet 3  . montelukast (SINGULAIR) 10 MG tablet Take 1 tablet (10 mg total) by mouth at bedtime. 90 tablet 3  . Omega-3 Fatty Acids (FISH OIL) 1000 MG CAPS Take 1 capsule by mouth 2 (two) times daily.    . pantoprazole (PROTONIX) 40 MG tablet Take 1 tablet (40 mg total) by   mouth 2 (two) times daily before a meal. 180 tablet 1  . sitaGLIPtin (JANUVIA) 100 MG tablet Take 1 tablet (100 mg total) by mouth daily. 90 tablet 3  . SUMAtriptan (IMITREX) 100 MG tablet Take 1 tablet at earliest onset of migraine.  May repeat once in 2 hours if headache persists or recurs. 10 tablet 2  . varenicline (CHANTIX STARTING MONTH PAK) 0.5 MG X 11 & 1 MG X 42 tablet TAKE AS DIRECTED BY MOUTH 53 tablet 0   No current facility-administered medications on file prior to visit.    Allergies  Allergen Reactions  . Gabapentin Other (See Comments)  . Levemir [Insulin Detemir] Dermatitis    Whelps at injection site  . Keflex [Cephalexin] Rash   Family History  Problem Relation Age of Onset  . Cancer Mother   . Diabetes Father   . Liver disease Father   . Heart attack Maternal Grandmother   . Cancer Paternal Grandfather    PE: BP 110/60 (BP Location: Right Arm, Patient Position: Sitting, Cuff Size: Large)   Pulse (!) 107   Ht 5' 1.5" (1.562 m)   Wt 297 lb (134.7 kg)   SpO2 96%   BMI 55.21 kg/m  Wt Readings from Last 3 Encounters:  08/25/17 297 lb (134.7 kg)  06/30/17 296 lb (134.3 kg)  05/26/17 292 lb (132.5 kg)   Constitutional: overweight, in NAD Eyes: PERRLA, EOMI, no exophthalmos ENT: moist mucous membranes, no thyromegaly, no cervical lymphadenopathy Cardiovascular:  tachycardia, RR, No MRG Respiratory: CTA B Gastrointestinal: abdomen soft, NT, ND, BS+ Musculoskeletal: no deformities, strength intact in all 4 Skin: moist, warm, no rashes Neurological: no tremor with outstretched hands, DTR normal in all 4  ASSESSMENT: 1. DM2, insulin-dependent, uncontrolled, without long term complications, but with hypoglycemia - We discussed about GLP-1 receptor agonist, however, we cannot use these due to her history of pancreatitis.  2. HL  3.  Obesity   PLAN:  1. Patient with long-standing, uncontrolled, type 2 diabetes, on oral antidiabetic regimen, and basal-bolus insulin regimen.  She was previously on the Vgo 40 patch pump.  However, the pump was coming off, so she had to stop.  At last visit, her sugars were higher during the holidays and also after her bronchitis episode so at last visit we increased Lantus and I suggested to split metformin in 3 doses since she was complaining of nausea, although this could have been due to antibiotics. - At this visit, sugars are much better, especially in the morning, now at or close to goal.  After dinner, they are still high, and we discussed about improving dinner, or increasing the Humalog before meals, since her sugars increased from morning to night.  I sent a new prescription for Humalog to the pharmacy reflecting this increased dose. - We cannot use a GLP-1 receptor agonist due to her history of pancreatitis.  Also, she tried SGLT2 inhibitors and she developed side effects (see HPI). - I suggested to:  Patient Instructions  Please continue: - Metformin 500 mg 3-4x a day with meals - Januvia 100 mg before b'fast. - Lantus 40 units at bedtime  Please increase Humalog to 15-25 units before meals.  Please return in 3 months with your sugar log.   - today, HbA1c is 7.9% (better) - continue checking sugars at different times of the day - check 3x a day, rotating checks - advised for yearly eye exams >> she is not  UTD - Return to clinic in 3  mo with sugar log   2. HL - Reviewed latest lipid panel from 06/2016: LDL at goal, triglycerides high - Continues the Lipitor and ezetimibe without side effects. - has labs coming up by PCP  3. Obesity -Gained 5 pounds since last visit -Unfortunately, we need to increase the insulin which may cause some more weight gain   , MD PhD [facility]   

## 2017-09-04 ENCOUNTER — Ambulatory Visit: Payer: 59 | Admitting: Physician Assistant

## 2017-09-04 ENCOUNTER — Encounter: Payer: Self-pay | Admitting: Physician Assistant

## 2017-09-04 ENCOUNTER — Other Ambulatory Visit: Payer: Self-pay

## 2017-09-04 VITALS — BP 112/70 | HR 108 | Temp 98.0°F | Resp 18 | Ht 61.5 in | Wt 296.4 lb

## 2017-09-04 DIAGNOSIS — R1909 Other intra-abdominal and pelvic swelling, mass and lump: Secondary | ICD-10-CM

## 2017-09-04 MED ORDER — DOXYCYCLINE HYCLATE 100 MG PO CAPS: 100.0000 mg | ORAL_CAPSULE | Freq: Two times a day (BID) | ORAL | 0 refills | Status: AC

## 2017-09-04 NOTE — Patient Instructions (Addendum)
  Come back in 10 or so days so I can look at the wound. Take meds as prescribed.  Use a warm compress.    IF you received an x-ray today, you will receive an invoice from [facility]. Please contact [facility] at [phone] with questions or concerns regarding your invoice.   IF you received labwork today, you will receive an invoice from [place]. Please contact [facility] at [phone] with questions or concerns regarding your invoice.   Our billing staff will not be able to assist you with questions regarding bills from these companies.  You will be contacted with the lab results as soon as they are available. The fastest way to get your results is to activate your My Chart account. Instructions are located on the last page of this paperwork. If you have not heard from [place] regarding the results in 2 weeks, please contact this office.

## 2017-09-04 NOTE — Progress Notes (Signed)
    09/04/2017 12:02 PM   DOB: [birth year] / MRN: [id number]  SUBJECTIVE:  Kelsey Vang is a 42 y.o. female presenting for mass in the right groin. Associates fever, chills nausea. Has had this before. She is eating well.    She is allergic to gabapentin; levemir [insulin detemir]; and keflex [cephalexin].   She  has a past medical history of Diabetes mellitus without complication (HCC) and Hyperlipemia.    She  reports that she has been smoking cigarettes.  She has a 10.00 pack-year smoking history. She has never used smokeless tobacco. She reports that she does not drink alcohol or use drugs. She  reports that she currently engages in sexual activity. The patient  has a past surgical history that includes Gallbladder surgery; Tubal ligation; Endometrial ablation; and Cholecystectomy.  Her family history includes Cancer in her mother and paternal grandfather; Diabetes in her father; Heart attack in her maternal grandmother; Liver disease in her father.  Review of Systems  Genitourinary: Negative for dysuria, frequency and urgency.    The problem list and medications were reviewed and updated by myself where necessary and exist elsewhere in the encounter.   OBJECTIVE:  BP 112/70   Pulse (!) 108   Temp 98 F (36.7 C) (Oral)   Resp 18   Ht 5' 1.5" (1.562 m)   Wt 296 lb 6.4 oz (134.4 kg)   SpO2 96%   BMI 55.10 kg/m   Pulse Readings from Last 3 Encounters:  09/04/17 (!) 108  08/25/17 (!) 107  06/30/17 (!) 118     Physical Exam  Constitutional: She is oriented to person, place, and time. She appears well-developed.  Eyes: Pupils are equal, round, and reactive to light. EOM are normal.  Cardiovascular: Regular rhythm.  Pulmonary/Chest: Effort normal.  Abdominal: She exhibits no distension.  Genitourinary:     Musculoskeletal: Normal range of motion.  Neurological: She is alert and oriented to person, place, and time. No cranial nerve deficit.  Skin: Skin is warm and  dry. She is not diaphoretic.  Psychiatric: She has a normal mood and affect.  Vitals reviewed.  Risk and benefits discussed and verbal consent obtained. Anesthetic allergies reviewed. Patient anesthetized using 1:1 mix of 2% lidocaine with epi. A 1 cm incision was made using a number 11 blade and purulent material was expressed.  The was wound packed. The patient tolerated the procedure without difficulty.   A clean dressing was placed and wound care instructions were provided.    No results found for this or any previous visit (from the past 72 hour(s)).  No results found.  ASSESSMENT AND PLAN:  Kelsey Vang was seen today for mass.  Diagnoses and all orders for this visit:  Groin mass in female: Lanced here with some exudate. RTC in 10 days for recheck.  -     WOUND CULTURE -     doxycycline (VIBRAMYCIN) 100 MG capsule; Take 1 capsule (100 mg total) by mouth 2 (two) times daily for 10 days.    The patient is advised to call or return to clinic if she does not see an improvement in symptoms, or to seek the care of the closest emergency department if she worsens with the above plan.   Deliah Boston, MHS, PA-C [facility] 09/04/2017 12:02 PM

## 2017-09-05 ENCOUNTER — Telehealth: Payer: Self-pay

## 2017-09-05 NOTE — Telephone Encounter (Signed)
Copied from CRM [id number]. Topic: Quick Communication - See Telephone Encounter >> Sep 05, 2017  7:11 AM Waymon Amato wrote: Pt was seen yesterday by clark and would like a work note stating that she was seen yesterday and returning back to day and pt is wanting to have this release to my chart so She can print it off at work   [employer] number [phone]

## 2017-09-05 NOTE — Telephone Encounter (Signed)
Letter sent.

## 2017-09-07 LAB — WOUND CULTURE: Organism ID, Bacteria: NONE SEEN

## 2017-09-08 MED FILL — AJOVY 225 MG/1.5ML SOSY: 225 | 30 days supply | Qty: 2 | Fill #2

## 2017-09-09 ENCOUNTER — Other Ambulatory Visit: Payer: Self-pay

## 2017-09-09 ENCOUNTER — Ambulatory Visit (INDEPENDENT_AMBULATORY_CARE_PROVIDER_SITE_OTHER): Payer: 59 | Admitting: Family Medicine

## 2017-09-09 ENCOUNTER — Encounter: Payer: Self-pay | Admitting: Family Medicine

## 2017-09-09 VITALS — BP 112/73 | HR 96 | Temp 97.5°F | Resp 17 | Ht 61.5 in | Wt 295.2 lb

## 2017-09-09 DIAGNOSIS — Z794 Long term (current) use of insulin: Secondary | ICD-10-CM | POA: Diagnosis not present

## 2017-09-09 DIAGNOSIS — Z5181 Encounter for therapeutic drug level monitoring: Secondary | ICD-10-CM

## 2017-09-09 DIAGNOSIS — Z0000 Encounter for general adult medical examination without abnormal findings: Secondary | ICD-10-CM | POA: Diagnosis not present

## 2017-09-09 DIAGNOSIS — E1142 Type 2 diabetes mellitus with diabetic polyneuropathy: Secondary | ICD-10-CM

## 2017-09-09 MED ORDER — EZETIMIBE 10 MG PO TABS: 10.0000 mg | ORAL_TABLET | Freq: Every day | ORAL | 3 refills | Status: DC

## 2017-09-09 NOTE — Progress Notes (Signed)
Chief Complaint  Patient presents with  . Annual Exam    physical no pap  . Medication Refill    zetia    Subjective:  Kelsey Vang is a 42 y.o. female here for a health maintenance visit.  Patient is established pt    Patient Active Problem List   Diagnosis Date Noted  . Tachycardia 01/04/2017  . Migraine with aura and without status migrainosus, not intractable 01/04/2017  . Palpitations 01/04/2017  . Obstructive apnea 04/23/2015  . Type 2 diabetes mellitus with diabetic polyneuropathy, with long-term current use of insulin (HCC) 12/27/2013  . Current tobacco use 12/27/2013  . Morbid (severe) obesity due to excess calories (HCC) 12/27/2013  . HLD (hyperlipidemia) 12/27/2013    Past Medical History:  Diagnosis Date  . Diabetes mellitus without complication (HCC)   . Hyperlipemia     Past Surgical History:  Procedure Laterality Date  . CHOLECYSTECTOMY    . ENDOMETRIAL ABLATION    . GALLBLADDER SURGERY    . TUBAL LIGATION       Outpatient Medications Prior to Visit  Medication Sig Dispense Refill  . ACCU-CHEK FASTCLIX LANCETS MISC Use to check sugars 2-3 times daily 102 each 11  . AJOVY 225 MG/1.5ML SOSY INJECT 225MG  INTO THE SKIN ONCE A MONTH  11  . atorvastatin (LIPITOR) 80 MG tablet Take 80 mg by mouth daily.    . cetirizine (ZYRTEC) 10 MG tablet Take 10 mg by mouth daily.    . Cholecalciferol (VITAMIN D) 2000 units CAPS Take 1 capsule by mouth daily.    . Coenzyme Q10 (CO Q 10) 100 MG CAPS Take 1 capsule by mouth daily.    . cyclobenzaprine (FLEXERIL) 10 MG tablet Take 1 tablet (10 mg total) by mouth 3 (three) times daily as needed for muscle spasms. 90 tablet 0  . diclofenac sodium (VOLTAREN) 1 % GEL Apply 2 g topically 4 (four) times daily. 100 g 0  . doxycycline (VIBRAMYCIN) 100 MG capsule Take 1 capsule (100 mg total) by mouth 2 (two) times daily for 10 days. 20 capsule 0  . ezetimibe (ZETIA) 10 MG tablet Take 1 tablet (10 mg total) by mouth daily. 90  tablet 3  . fluticasone (FLONASE) 50 MCG/ACT nasal spray Place 1 spray into both nostrils 2 (two) times daily.    Marland Kitchen glucose blood (ACCU-CHEK GUIDE) test strip Use as instructed 100 each 12  . insulin glargine (LANTUS) 100 UNIT/ML injection Inject 0.4 mLs (40 Units total) into the skin at bedtime. Pens please. 45 mL 3  . insulin lispro (HUMALOG) 100 UNIT/ML injection Inject under skin 20-25 before the 3 meals. Pens please. 60 mL 3  . Insulin Pen Needle 32G X 4 MM MISC Use to inject insulin 4 times daily 400 each 5  . lisinopril (PRINIVIL,ZESTRIL) 5 MG tablet TAKE 1 TABLET BY MOUTH ONCE DAILY 90 tablet 1  . magnesium oxide (MAG-OX) 400 MG tablet Take 400 mg by mouth daily.    . metFORMIN (GLUCOPHAGE-XR) 500 MG 24 hr tablet Take 2 tablets (1,000 mg total) by mouth 2 (two) times daily with a meal. 360 tablet 3  . metoprolol succinate (TOPROL XL) 25 MG 24 hr tablet Take 1 tablet (25 mg total) by mouth daily. 90 tablet 3  . montelukast (SINGULAIR) 10 MG tablet Take 1 tablet (10 mg total) by mouth at bedtime. 90 tablet 3  . Omega-3 Fatty Acids (FISH OIL) 1000 MG CAPS Take 1 capsule by mouth 2 (two) times daily.    Marland Kitchen  pantoprazole (PROTONIX) 40 MG tablet Take 1 tablet (40 mg total) by mouth 2 (two) times daily before a meal. 180 tablet 1  . sitaGLIPtin (JANUVIA) 100 MG tablet Take 1 tablet (100 mg total) by mouth daily. 90 tablet 3  . SUMAtriptan (IMITREX) 100 MG tablet Take 1 tablet at earliest onset of migraine.  May repeat once in 2 hours if headache persists or recurs. 10 tablet 2   No facility-administered medications prior to visit.     Allergies  Allergen Reactions  . Gabapentin Other (See Comments)  . Levemir [Insulin Detemir] Dermatitis    Whelps at injection site  . Keflex [Cephalexin] Rash     Family History  Problem Relation Age of Onset  . Cancer Mother   . Diabetes Father   . Liver disease Father   . Heart attack Maternal Grandmother   . Cancer Paternal Grandfather       Health Habits: Dental Exam: up to date Eye Exam: up to date Exercise: 7 times/week on average Current exercise activities: walking Diet: diabetic  Social History   Socioeconomic History  . Marital status: Married    Spouse name: Molli Hazard  . Number of children: 1  . Years of education: Not on file  . Highest education level: Not on file  Occupational History  . Not on file  Social Needs  . Financial resource strain: Not on file  . Food insecurity:    Worry: Not on file    Inability: Not on file  . Transportation needs:    Medical: Not on file    Non-medical: Not on file  Tobacco Use  . Smoking status: Current Every Day Smoker    Packs/day: 0.50    Years: 20.00    Pack years: 10.00    Types: Cigarettes  . Smokeless tobacco: Never Used  Substance and Sexual Activity  . Alcohol use: No  . Drug use: No  . Sexual activity: Yes  Lifestyle  . Physical activity:    Days per week: Not on file    Minutes per session: Not on file  . Stress: Not on file  Relationships  . Social connections:    Talks on phone: Not on file    Gets together: Not on file    Attends religious service: Not on file    Active member of club or organization: Not on file    Attends meetings of clubs or organizations: Not on file    Relationship status: Not on file  . Intimate partner violence:    Fear of current or ex partner: Not on file    Emotionally abused: Not on file    Physically abused: Not on file    Forced sexual activity: Not on file  Other Topics Concern  . Not on file  Social History Narrative  . Not on file   Social History   Substance and Sexual Activity  Alcohol Use No   Social History   Tobacco Use  Smoking Status Current Every Day Smoker  . Packs/day: 0.50  . Years: 20.00  . Pack years: 10.00  . Types: Cigarettes  Smokeless Tobacco Never Used   Social History   Substance and Sexual Activity  Drug Use No    GYN: Sexual Health Menstrual status: regular  menses LMP: Patient's last menstrual period was 09/01/2017. Last pap smear: see HM section History of abnormal pap smears:  Sexually active: with female partner Current contraception: tubal and ablation  Health Maintenance: See under health Maintenance  activity for review of completion dates as well.  There is no immunization history on file for this patient.    Depression Screen-PHQ2/9 Depression screen [facility] 2/9 09/09/2017 09/04/2017 06/30/2017 05/18/2017 04/03/2017  Decreased Interest 0 0 0 0 0  Down, Depressed, Hopeless 0 0 0 0 0  PHQ - 2 Score 0 0 0 0 0       Depression Severity and Treatment Recommendations:  0-4= None  5-9= Mild / Treatment: Support, educate to call if worse; return in one month  10-14= Moderate / Treatment: Support, watchful waiting; Antidepressant or Psycotherapy  15-19= Moderately severe / Treatment: Antidepressant OR Psychotherapy  >= 20 = Major depression, severe / Antidepressant AND Psychotherapy    Review of Systems   Review of Systems  Constitutional: Positive for malaise/fatigue. Negative for chills and fever.  HENT: Positive for congestion and sinus pain. Negative for ear pain, hearing loss and tinnitus.   Eyes: Negative for blurred vision, double vision, photophobia and pain.  Respiratory: Positive for cough.   Cardiovascular: Negative for chest pain, palpitations, orthopnea and claudication.  Gastrointestinal: Positive for diarrhea and nausea. Negative for abdominal pain, heartburn and vomiting.  Genitourinary: Negative for dysuria, frequency and urgency.  Musculoskeletal: Positive for back pain and joint pain.  Skin: Positive for rash. Negative for itching.  Neurological: Positive for headaches. Negative for tingling, tremors and sensory change.  Endo/Heme/Allergies: Positive for environmental allergies. Negative for polydipsia.  Psychiatric/Behavioral: Negative for depression. The patient is not nervous/anxious.     See HPI for ROS as  well.    Objective:   Vitals:   09/09/17 1040  BP: 112/73  Pulse: 96  Resp: 17  Temp: (!) 97.5 F (36.4 C)  TempSrc: Oral  SpO2: 94%  Weight: 295 lb 3.2 oz (133.9 kg)  Height: 5' 1.5" (1.562 m)   Diabetic Foot Exam - Simple   No data filed       Body mass index is 54.87 kg/m.  Physical Exam  Constitutional: She is oriented to person, place, and time. She appears well-developed and well-nourished.  Morbidly obese habitus  HENT:  Head: Normocephalic and atraumatic.  Right Ear: External ear normal.  Left Ear: External ear normal.  Mouth/Throat: Oropharynx is clear and moist.  Eyes: Conjunctivae and EOM are normal.  Neck: Normal range of motion. Neck supple. No thyromegaly present.  Cardiovascular: Normal rate, regular rhythm and normal heart sounds. Exam reveals no friction rub.  No murmur heard. Pulmonary/Chest: Effort normal and breath sounds normal. No stridor. No respiratory distress. She has no wheezes. She has no rales.  Abdominal: Soft. Bowel sounds are normal. She exhibits no distension and no mass. There is no tenderness. There is no guarding.  Genitourinary:  Genitourinary Comments: Deferred. [facility]  Musculoskeletal: Normal range of motion. She exhibits no edema.  Neurological: She is alert and oriented to person, place, and time.  Skin: Skin is warm. Capillary refill takes less than 2 seconds.  Diffuse hyperemic macular lesions, nonblanching,  Only on the sun-exposed areas  Psychiatric: She has a normal mood and affect. Her behavior is normal. Judgment and thought content normal.       Assessment/Plan:   Patient was seen for a health maintenance exam.  Counseled the patient on health maintenance issues. Reviewed her health mainteance schedule and ordered appropriate tests (see orders.) Counseled on regular exercise and weight management. Recommend regular eye exams and dental cleaning.   The following issues were addressed  today  for health maintenance:   Dyann was seen today for annual exam and medication refill.  Diagnoses and all orders for this visit:  Health maintenance examination Women's Health Maintenance Plan Advised monthly breast exam and annual mammogram Advised dental exam every six months Discussed stress management Discussed pap smear screening guidelines   Encounter for medication monitoring -     Comprehensive metabolic panel -     Lipid panel  Type 2 diabetes mellitus with diabetic polyneuropathy, with long-term current use of insulin (HCC) -     HM Diabetes Foot Exam    Return in about 3 months (around 12/10/2017) for fatigue and diabetes follow up.    Body mass index is 54.87 kg/m.:  Discussed the patient's BMI with patient. The BMI body mass index is 54.87 kg/m.     Future Appointments  Date Time Provider Department Center  09/15/2017  4:20 PM Silvestre Mesi PCP-PCP [facility]  10/20/2017  1:50 PM Drema Dallas, DO [facility] None  12/29/2017  3:30 PM Carlus Pavlov, MD [facility] None    Patient Instructions       IF you received an x-ray today, you will receive an invoice from [facility]. Please contact [facility] at [phone] with questions or concerns regarding your invoice.   IF you received labwork today, you will receive an invoice from [facility]. Please contact [facility] at [phone] with questions or concerns regarding your invoice.   Our billing staff will not be able to assist you with questions regarding bills from these companies.  You will be contacted with the lab results as soon as they are available. The fastest way to get your results is to activate your My Chart account. Instructions are located on the last page of this paperwork. If you have not heard from [place] regarding the results in 2 weeks, please contact this office.

## 2017-09-09 NOTE — Patient Instructions (Signed)
     IF you received an x-ray today, you will receive an invoice from [facility]. Please contact [facility] at [phone] with questions or concerns regarding your invoice.   IF you received labwork today, you will receive an invoice from [facility]. Please contact [facility] at [phone] with questions or concerns regarding your invoice.   Our billing staff will not be able to assist you with questions regarding bills from these companies.  You will be contacted with the lab results as soon as they are available. The fastest way to get your results is to activate your My Chart account. Instructions are located on the last page of this paperwork. If you have not heard from us regarding the results in 2 weeks, please contact this office.     

## 2017-09-10 LAB — COMPREHENSIVE METABOLIC PANEL
A/G RATIO: 1.7 (ref 1.2–2.2)
ALT: 71 IU/L — AB (ref 0–32)
AST: 44 IU/L — AB (ref 0–40)
Albumin: 4 g/dL (ref 3.5–5.5)
Alkaline Phosphatase: 110 IU/L (ref 39–117)
BILIRUBIN TOTAL: 0.3 mg/dL (ref 0.0–1.2)
BUN/Creatinine Ratio: 15 (ref 9–23)
BUN: 11 mg/dL (ref 6–24)
CALCIUM: 9.6 mg/dL (ref 8.7–10.2)
CHLORIDE: 105 mmol/L (ref 96–106)
CO2: 22 mmol/L (ref 20–29)
Creatinine, Ser: 0.74 mg/dL (ref 0.57–1.00)
GFR calc Af Amer: 116 mL/min/{1.73_m2} (ref >59)
GFR calc non Af Amer: 101 mL/min/{1.73_m2} (ref >59)
GLOBULIN, TOTAL: 2.3 g/dL (ref 1.5–4.5)
Glucose: 167 mg/dL — ABNORMAL HIGH (ref 65–99)
POTASSIUM: 4.7 mmol/L (ref 3.5–5.2)
SODIUM: 138 mmol/L (ref 134–144)
Total Protein: 6.3 g/dL (ref 6.0–8.5)

## 2017-09-10 LAB — LIPID PANEL
CHOL/HDL RATIO: 6.3 ratio — AB (ref 0.0–4.4)
Cholesterol, Total: 304 mg/dL — ABNORMAL HIGH (ref 100–199)
HDL: 48 mg/dL (ref >39)
LDL Calculated: 198 mg/dL — ABNORMAL HIGH (ref 0–99)
TRIGLYCERIDES: 289 mg/dL — AB (ref 0–149)
VLDL Cholesterol Cal: 58 mg/dL — ABNORMAL HIGH (ref 5–40)

## 2017-09-10 MED ORDER — ATORVASTATIN CALCIUM 80 MG PO TABS: 80.0000 mg | ORAL_TABLET | Freq: Every day | ORAL | 3 refills | Status: DC

## 2017-09-12 MED FILL — ATORVASTATIN 80 MG TABLET: 80 | 90 days supply | Qty: 90 | Fill #0

## 2017-09-12 MED FILL — EZETIMIBE 10 MG TABLET: 10 | 90 days supply | Qty: 90 | Fill #0

## 2017-09-13 ENCOUNTER — Encounter (HOSPITAL_COMMUNITY): Payer: Self-pay | Admitting: Emergency Medicine

## 2017-09-13 ENCOUNTER — Ambulatory Visit (HOSPITAL_COMMUNITY)
Admission: EM | Admit: 2017-09-13 | Discharge: 2017-09-13 | Disposition: A | Discharge status: Home / Self Care | Payer: 59 | Source: Home / Self Care | Attending: Family Medicine | Admitting: Family Medicine

## 2017-09-13 ENCOUNTER — Emergency Department (HOSPITAL_COMMUNITY)
Admission: EM | Admit: 2017-09-13 | Discharge: 2017-09-14 | Disposition: A | Discharge status: Home / Self Care | Payer: 59 | Attending: Emergency Medicine | Admitting: Emergency Medicine

## 2017-09-13 ENCOUNTER — Emergency Department (HOSPITAL_COMMUNITY): Payer: 59

## 2017-09-13 ENCOUNTER — Other Ambulatory Visit: Payer: Self-pay

## 2017-09-13 DIAGNOSIS — R159 Full incontinence of feces: Secondary | ICD-10-CM | POA: Diagnosis not present

## 2017-09-13 DIAGNOSIS — E1142 Type 2 diabetes mellitus with diabetic polyneuropathy: Secondary | ICD-10-CM | POA: Diagnosis not present

## 2017-09-13 DIAGNOSIS — R32 Unspecified urinary incontinence: Secondary | ICD-10-CM | POA: Diagnosis not present

## 2017-09-13 DIAGNOSIS — M5441 Lumbago with sciatica, right side: Secondary | ICD-10-CM | POA: Insufficient documentation

## 2017-09-13 DIAGNOSIS — M545 Low back pain: Secondary | ICD-10-CM

## 2017-09-13 DIAGNOSIS — F17210 Nicotine dependence, cigarettes, uncomplicated: Secondary | ICD-10-CM | POA: Diagnosis not present

## 2017-09-13 DIAGNOSIS — Z79899 Other long term (current) drug therapy: Secondary | ICD-10-CM | POA: Insufficient documentation

## 2017-09-13 DIAGNOSIS — Z794 Long term (current) use of insulin: Secondary | ICD-10-CM | POA: Diagnosis not present

## 2017-09-13 DIAGNOSIS — M5489 Other dorsalgia: Secondary | ICD-10-CM | POA: Diagnosis present

## 2017-09-13 LAB — URINALYSIS, ROUTINE W REFLEX MICROSCOPIC
BILIRUBIN URINE: NEGATIVE
Glucose, UA: NEGATIVE mg/dL
HGB URINE DIPSTICK: NEGATIVE
KETONES UR: NEGATIVE mg/dL
Leukocytes, UA: NEGATIVE
NITRITE: NEGATIVE
Protein, ur: NEGATIVE mg/dL
SPECIFIC GRAVITY, URINE: 1.009 (ref 1.005–1.030)
pH: 5 (ref 5.0–8.0)

## 2017-09-13 LAB — COMPREHENSIVE METABOLIC PANEL
ALK PHOS: 88 U/L (ref 38–126)
ALT: 40 U/L (ref 14–54)
ANION GAP: 10 (ref 5–15)
AST: 25 U/L (ref 15–41)
Albumin: 3.2 g/dL — ABNORMAL LOW (ref 3.5–5.0)
BILIRUBIN TOTAL: 0.7 mg/dL (ref 0.3–1.2)
BUN: 10 mg/dL (ref 6–20)
CALCIUM: 9.3 mg/dL (ref 8.9–10.3)
CO2: 24 mmol/L (ref 22–32)
Chloride: 102 mmol/L (ref 101–111)
Creatinine, Ser: 0.81 mg/dL (ref 0.44–1.00)
GFR calc Af Amer: >60 mL/min (ref >60)
GFR calc non Af Amer: >60 mL/min (ref >60)
Glucose, Bld: 192 mg/dL — ABNORMAL HIGH (ref 65–99)
POTASSIUM: 4.1 mmol/L (ref 3.5–5.1)
Sodium: 136 mmol/L (ref 135–145)
TOTAL PROTEIN: 6.6 g/dL (ref 6.5–8.1)

## 2017-09-13 LAB — CBC WITH DIFFERENTIAL/PLATELET
Abs Immature Granulocytes: 0.1 10*3/uL (ref 0.0–0.1)
BASOS PCT: 0 %
Basophils Absolute: 0.1 10*3/uL (ref 0.0–0.1)
EOS PCT: 4 %
Eosinophils Absolute: 0.5 10*3/uL (ref 0.0–0.7)
HCT: 38.5 % (ref 36.0–46.0)
HEMOGLOBIN: 12.4 g/dL (ref 12.0–15.0)
Immature Granulocytes: 1 %
LYMPHS PCT: 22 %
Lymphs Abs: 3.2 10*3/uL (ref 0.7–4.0)
MCH: 30.5 pg (ref 26.0–34.0)
MCHC: 32.2 g/dL (ref 30.0–36.0)
MCV: 94.6 fL (ref 78.0–100.0)
MONO ABS: 0.8 10*3/uL (ref 0.1–1.0)
Monocytes Relative: 6 %
NEUTROS ABS: 10 10*3/uL — AB (ref 1.7–7.7)
Neutrophils Relative %: 69 %
Platelets: 291 10*3/uL (ref 150–400)
RBC: 4.07 MIL/uL (ref 3.87–5.11)
RDW: 14.5 % (ref 11.5–15.5)
WBC: 14.6 10*3/uL — AB (ref 4.0–10.5)

## 2017-09-13 LAB — I-STAT BETA HCG BLOOD, ED (MC, WL, AP ONLY)

## 2017-09-13 MED ORDER — ONDANSETRON 4 MG PO TBDP
4.0000 mg | ORAL_TABLET | Freq: Once | ORAL | Status: AC
  Administered 2017-09-13: 4 mg via ORAL
  Filled 2017-09-13: qty 1

## 2017-09-13 MED ORDER — OXYCODONE-ACETAMINOPHEN 5-325 MG PO TABS
2.0000 | ORAL_TABLET | Freq: Once | ORAL | Status: AC
  Administered 2017-09-13: 2 via ORAL
  Filled 2017-09-13: qty 2

## 2017-09-13 NOTE — ED Triage Notes (Signed)
Pt sts lower back pain x 3 days

## 2017-09-13 NOTE — ED Provider Notes (Signed)
42 year old female comes in with 3-day history of low back pain.  States that she was at work, moved the wrong way and heard "my spine pop". Since then, states she has had her spine "pop" as if she was "cracking her knuckles".  Pain is bilateral lower back, but radiates down her right hip to leg.  Has had numbness and tingling of the lateral side of the right leg.  Denies saddle anesthesia.  However, she has had 4 episodes of urinary incontinence and 2 episodes of bowel incontinence.  States she has not been able to hold her bowel in, and leakage is significant. She has also had dribbling of urine when she tries to urinate. States now with trouble moving right leg.  Given history, worries for nerve involvement due to bowel and urinary incontinence. Will discharge patient in stable condition to the emergency department for further evaluation and management needed. Case discussed with Dr Tracie Harrier, who agrees to plan.    Belinda Fisher, PA-C 09/13/17 1750

## 2017-09-13 NOTE — ED Triage Notes (Signed)
Pt states on Monday she thinks she "turned the wrong way" and felt something "pop" across her back, lower and mid back, down right hip and right leg. Has weakness to right leg. Reports bowel and bladder incontinence. Was seen at Urgent Care and sent here for evaluation.

## 2017-09-13 NOTE — Discharge Instructions (Signed)
Given new low back pain, reported bowel and bladder incontinence, worries for nerve involvement, cauda equina, please go to the emergency department for further evaluation and management needed.

## 2017-09-13 NOTE — ED Notes (Signed)
Patient transported to MRI 

## 2017-09-13 NOTE — ED Provider Notes (Signed)
  MSE was initiated and I personally evaluated the patient and placed orders (if any) at  8:21 PM on Sep 13, 2017.  The patient appears stable so that the remainder of the MSE may be completed by another provider.   Patient placed in Quick Look pathway, seen and evaluated   Chief Complaint: Back pain  HPI:   Patient hurt her back Monday 1pm. Twisted, felt her back pop and had Seering pain with radiation down the R leg and into the Hip. Patient had 2 episodes of Bowel/ bladder incontinence. Was sent for MRI. On doxy for " sinus infection."     ROS: back pain (one)  Physical Exam:   Gen: No distress  Neuro: Awake and Alert  Skin: Warm    Focused Exam: Normal strength and sensation in lower extremity.   Initiation of care has begun. The patient has been counseled on the process, plan, and necessity for staying for the completion/evaluation, and the remainder of the medical screening examination    Arthor Captain, Cordelia Poche 09/13/17 2028    Abelino Derrick, MD 09/15/17 0011

## 2017-09-13 NOTE — ED Notes (Signed)
Patient called x 4 with no response in all areas of the lobby.

## 2017-09-14 DIAGNOSIS — Z79899 Other long term (current) drug therapy: Secondary | ICD-10-CM | POA: Diagnosis not present

## 2017-09-14 DIAGNOSIS — E1142 Type 2 diabetes mellitus with diabetic polyneuropathy: Secondary | ICD-10-CM | POA: Diagnosis not present

## 2017-09-14 DIAGNOSIS — M5441 Lumbago with sciatica, right side: Secondary | ICD-10-CM | POA: Diagnosis not present

## 2017-09-14 DIAGNOSIS — F17210 Nicotine dependence, cigarettes, uncomplicated: Secondary | ICD-10-CM | POA: Diagnosis not present

## 2017-09-14 DIAGNOSIS — M545 Low back pain: Secondary | ICD-10-CM | POA: Diagnosis not present

## 2017-09-14 DIAGNOSIS — Z794 Long term (current) use of insulin: Secondary | ICD-10-CM | POA: Diagnosis not present

## 2017-09-14 MED ORDER — LIDOCAINE 5 % EX PTCH
1.0000 | MEDICATED_PATCH | CUTANEOUS | Status: DC
  Administered 2017-09-14: 1 via TRANSDERMAL
  Filled 2017-09-14: qty 1

## 2017-09-14 MED ORDER — DEXAMETHASONE 4 MG PO TABS
10.0000 mg | ORAL_TABLET | Freq: Once | ORAL | Status: AC
  Administered 2017-09-14: 10 mg via ORAL
  Filled 2017-09-14: qty 3

## 2017-09-14 MED ORDER — HYDROCODONE-ACETAMINOPHEN 5-325 MG PO TABS: 1.0000 | ORAL_TABLET | Freq: Four times a day (QID) | ORAL | 0 refills | Status: DC | PRN

## 2017-09-14 MED ORDER — NAPROXEN 500 MG PO TABS: 500.0000 mg | ORAL_TABLET | Freq: Two times a day (BID) | ORAL | 0 refills | Status: DC

## 2017-09-14 MED ORDER — KETOROLAC TROMETHAMINE 60 MG/2ML IM SOLN
60.0000 mg | Freq: Once | INTRAMUSCULAR | Status: AC
  Administered 2017-09-14: 60 mg via INTRAMUSCULAR
  Filled 2017-09-14: qty 2

## 2017-09-14 MED ORDER — OXYCODONE-ACETAMINOPHEN 5-325 MG PO TABS
2.0000 | ORAL_TABLET | Freq: Once | ORAL | Status: AC
  Administered 2017-09-14: 2 via ORAL
  Filled 2017-09-14: qty 2

## 2017-09-14 NOTE — Discharge Instructions (Signed)
Alternate ice and heat to areas of injury 3-4 times per day to limit inflammation and spasm.  Avoid strenuous activity and heavy lifting.  We recommend consistent use of naproxen in addition to Flexeril for muscle spasms.  You have been prescribed Norco to take as needed for severe pain.  Do not drive or drink alcohol after taking this medication as it may make you drowsy and impair your judgment.  We recommend follow-up with a primary care doctor to ensure resolution of symptoms.  Return to the ED for any new or concerning symptoms.

## 2017-09-14 NOTE — ED Provider Notes (Signed)
[facility] EMERGENCY DEPARTMENT Provider Note   CSN: [id number] Arrival date & time: 09/13/17  1804     History   Chief Complaint Chief Complaint  Patient presents with  . Back Pain    HPI Kelsey Vang is a 42 y.o. female.  42 year old female with a history of diabetes, dyslipidemia presents to the emergency department for evaluation of back pain.  Triage note that symptoms began on Monday; however, patient recalls that it actually began on Sunday.  She reports twisting and feeling a "pop" across her lower back.  This pain radiates to her right hip and right leg.  She has tried Tylenol, Aleve, Flexeril for symptoms without relief.  Pain aggravated with ambulation.  She became concerned after 2 episodes of bowel incontinence and 2 episodes of bladder incontinence.  She notes incontinence of stool on Monday as well as last night.  Urinary incontinence occurred on Sunday as well as yesterday.  She denies any urge to defecate or void associated with incontinence episodes.  She has not had any genital numbness, lower extremity numbness, fevers, history of IV drug use.  She does have a history of similar back pain in the past.  The history is provided by the patient. No language interpreter was used.  Back Pain      Past Medical History:  Diagnosis Date  . Diabetes mellitus without complication (HCC)   . Hyperlipemia     Patient Active Problem List   Diagnosis Date Noted  . Tachycardia 01/04/2017  . Migraine with aura and without status migrainosus, not intractable 01/04/2017  . Palpitations 01/04/2017  . Obstructive apnea 04/23/2015  . Type 2 diabetes mellitus with diabetic polyneuropathy, with long-term current use of insulin (HCC) 12/27/2013  . Current tobacco use 12/27/2013  . Morbid (severe) obesity due to excess calories (HCC) 12/27/2013  . HLD (hyperlipidemia) 12/27/2013    Past Surgical History:  Procedure Laterality Date  . CHOLECYSTECTOMY    .  ENDOMETRIAL ABLATION    . GALLBLADDER SURGERY    . TUBAL LIGATION       OB History   None      Home Medications    Prior to Admission medications   Medication Sig Start Date End Date Taking? Authorizing Provider  ACCU-CHEK FASTCLIX LANCETS MISC Use to check sugars 2-3 times daily 04/17/17   Carlus Pavlov, MD  AJOVY 225 MG/1.5ML SOSY INJECT 225MG  INTO THE SKIN ONCE A MONTH 08/11/17   [provider]  atorvastatin (LIPITOR) 80 MG tablet Take 1 tablet (80 mg total) by mouth daily. 09/10/17   Doristine Bosworth, MD  cetirizine (ZYRTEC) 10 MG tablet Take 10 mg by mouth daily.    [provider]  Cholecalciferol (VITAMIN D) 2000 units CAPS Take 1 capsule by mouth daily.    [provider]  Coenzyme Q10 (CO Q 10) 100 MG CAPS Take 1 capsule by mouth daily.    [provider]  cyclobenzaprine (FLEXERIL) 10 MG tablet Take 1 tablet (10 mg total) by mouth 3 (three) times daily as needed for muscle spasms. 11/10/16   Trena Platt D, PA  diclofenac sodium (VOLTAREN) 1 % GEL Apply 2 g topically 4 (four) times daily. 04/03/17   Doristine Bosworth, MD  doxycycline (VIBRAMYCIN) 100 MG capsule Take 1 capsule (100 mg total) by mouth 2 (two) times daily for 10 days. 09/04/17 09/14/17  Ofilia Neas, PA-C  ezetimibe (ZETIA) 10 MG tablet Take 1 tablet (10 mg total) by  mouth daily. 09/09/17   Doristine Bosworth, MD  fluticasone (FLONASE) 50 MCG/ACT nasal spray Place 1 spray into both nostrils 2 (two) times daily.    [provider]  glucose blood (ACCU-CHEK GUIDE) test strip Use as instructed 04/17/17   Carlus Pavlov, MD  HYDROcodone-acetaminophen (NORCO/VICODIN) 5-325 MG tablet Take 1-2 tablets by mouth every 6 (six) hours as needed. 09/14/17   Antony Madura, PA-C  insulin glargine (LANTUS) 100 UNIT/ML injection Inject 0.4 mLs (40 Units total) into the skin at bedtime. Pens please. 05/26/17   Carlus Pavlov, MD  insulin lispro (HUMALOG) 100 UNIT/ML injection  Inject under skin 20-25 before the 3 meals. Pens please. 08/25/17   Carlus Pavlov, MD  Insulin Pen Needle 32G X 4 MM MISC Use to inject insulin 4 times daily 12/26/16   Carlus Pavlov, MD  lisinopril (PRINIVIL,ZESTRIL) 5 MG tablet TAKE 1 TABLET BY MOUTH ONCE DAILY 03/17/17   Collie Siad A, MD  magnesium oxide (MAG-OX) 400 MG tablet Take 400 mg by mouth daily.    [provider]  metFORMIN (GLUCOPHAGE-XR) 500 MG 24 hr tablet Take 2 tablets (1,000 mg total) by mouth 2 (two) times daily with a meal. 05/26/17   Carlus Pavlov, MD  metoprolol succinate (TOPROL XL) 25 MG 24 hr tablet Take 1 tablet (25 mg total) by mouth daily. 05/03/17   Chilton Si, MD  montelukast (SINGULAIR) 10 MG tablet Take 1 tablet (10 mg total) by mouth at bedtime. 08/17/16   Doristine Bosworth, MD  naproxen (NAPROSYN) 500 MG tablet Take 1 tablet (500 mg total) by mouth 2 (two) times daily. 09/14/17   Antony Madura, PA-C  Omega-3 Fatty Acids (FISH OIL) 1000 MG CAPS Take 1 capsule by mouth 2 (two) times daily.    [provider]  pantoprazole (PROTONIX) 40 MG tablet Take 1 tablet (40 mg total) by mouth 2 (two) times daily before a meal. 08/18/17   Stallings, Zoe A, MD  sitaGLIPtin (JANUVIA) 100 MG tablet Take 1 tablet (100 mg total) by mouth daily. 05/26/17   Carlus Pavlov, MD  SUMAtriptan (IMITREX) 100 MG tablet Take 1 tablet at earliest onset of migraine.  May repeat once in 2 hours if headache persists or recurs. 06/30/17   Drema Dallas, DO    Family History Family History  Problem Relation Age of Onset  . Cancer Mother   . Diabetes Father   . Liver disease Father   . Heart attack Maternal Grandmother   . Cancer Paternal Grandfather     Social History Social History   Tobacco Use  . Smoking status: Current Every Day Smoker    Packs/day: 0.50    Years: 20.00    Pack years: 10.00    Types: Cigarettes  . Smokeless tobacco: Never Used  Substance Use Topics  . Alcohol use: No  . Drug use:  No     Allergies   Gabapentin; Levemir [insulin detemir]; and Keflex [cephalexin]   Review of Systems Review of Systems  Musculoskeletal: Positive for back pain.  Ten systems reviewed and are negative for acute change, except as noted in the HPI.    Physical Exam Updated Vital Signs BP 100/60 (BP Location: Right Arm)   Pulse 90   Temp 98.6 F (37 C) (Oral)   Resp 18   Ht 5' 1.5" (1.562 m)   Wt 133.8 kg (295 lb)   LMP 09/01/2017   SpO2 100%   BMI 54.84 kg/m   Physical Exam  Constitutional: She  is oriented to person, place, and time. She appears well-developed and well-nourished. No distress.  Nontoxic appearing and in no distress.  Appears uncomfortable.  HENT:  Head: Normocephalic and atraumatic.  Eyes: Conjunctivae and EOM are normal. No scleral icterus.  Neck: Normal range of motion.  Cardiovascular: Normal rate, regular rhythm and intact distal pulses.  DP pulse 2+ bilateral lower extremities  Pulmonary/Chest: Effort normal. No respiratory distress.  Respirations even and unlabored  Musculoskeletal: Normal range of motion.  Tenderness to palpation of the lower lumbar midline extending to the right lumbar paraspinal muscles.  No bony deformities, step-offs, crepitus.  Exam slightly limited secondary to body habitus.  Neurological: She is alert and oriented to person, place, and time. She exhibits normal muscle tone. Coordination normal.  Sensation to light touch intact in bilateral lower extremities.  Patient moving all extremities spontaneously.  5/5 strength against resistance in all major muscle groups of bilateral lower extremities.  Skin: Skin is warm and dry. No rash noted. She is not diaphoretic. No erythema. No pallor.  Psychiatric: She has a normal mood and affect. Her behavior is normal.  Nursing note and vitals reviewed.    ED Treatments / Results  Labs (all labs ordered are listed, but only abnormal results are displayed) Labs Reviewed  COMPREHENSIVE  METABOLIC PANEL - Abnormal; Notable for the following components:      Result Value   Glucose, Bld 192 (*)    Albumin 3.2 (*)    All other components within normal limits  CBC WITH DIFFERENTIAL/PLATELET - Abnormal; Notable for the following components:   WBC 14.6 (*)    Neutro Abs 10.0 (*)    All other components within normal limits  URINALYSIS, ROUTINE W REFLEX MICROSCOPIC  I-STAT BETA HCG BLOOD, ED ([facility], [facility], [facility] ONLY)    EKG None  Radiology Mr Lumbar Spine Wo Contrast  Result Date: 09/14/2017 CLINICAL DATA:  42 y/o F; 3 day history of lower back pain, numbness and tingling of the lateral side of right leg, bowel/urinary incontinence. EXAM: MRI LUMBAR SPINE WITHOUT CONTRAST TECHNIQUE: Multiplanar, multisequence MR imaging of the lumbar spine was performed. No intravenous contrast was administered. COMPARISON:  11/10/2016 lumbar spine radiographs. FINDINGS: Segmentation:  Standard. Alignment: Mild levocurvature with apex at L4. Normal lumbar lordosis without listhesis. Vertebrae:  No fracture, evidence of discitis, or bone lesion. Conus medullaris and cauda equina: Conus extends to the L1-2 level. Conus and cauda equina appear normal. Paraspinal and other soft tissues: Negative. Disc levels: L1-2:  Mild disc bulge.  No significant foraminal or canal stenosis. L2-3: Minimal disc bulge. No significant foraminal or canal stenosis. L3-4: Minimal disc bulge. No significant foraminal or canal stenosis. L4-5: Mild disc bulge with moderate left and mild right facet hypertrophy. Mild bilateral foraminal stenosis and canal stenosis. L5-S1: No significant disc displacement. Mild facet hypertrophy. No foraminal or canal stenosis. IMPRESSION: 1. No acute osseous abnormality.  Mild levoscoliosis. 2. Mild discogenic degenerative changes and lower lumbar facet hypertrophy greatest at L4-5. 3. L4-5 mild bilateral foraminal and mild canal stenosis. No high-grade foraminal or canal stenosis. Electronically Signed    By: Mitzi Hansen M.D.   On: 09/14/2017 00:38    Procedures Procedures (including critical care time)  Medications Ordered in ED Medications  ketorolac (TORADOL) injection 60 mg (has no administration in time range)  dexamethasone (DECADRON) tablet 10 mg (has no administration in time range)  oxyCODONE-acetaminophen (PERCOCET/ROXICET) 5-325 MG per tablet 2 tablet (has no administration in time range)  lidocaine (  LIDODERM) 5 % 1 patch (has no administration in time range)  oxyCODONE-acetaminophen (PERCOCET/ROXICET) 5-325 MG per tablet 2 tablet (2 tablets Oral Given 09/13/17 2109)  ondansetron (ZOFRAN-ODT) disintegrating tablet 4 mg (4 mg Oral Given 09/13/17 2109)     Initial Impression / Assessment and Plan / ED Course  I have reviewed the triage vital signs and the nursing notes.  Pertinent labs & imaging results that were available during my care of the patient were reviewed by me and considered in my medical decision making (see chart for details).     42 year old female presents to the emergency department for evaluation of back pain which began 5 days ago.  Symptoms have been constant and radiate to the right hip and right leg.  She notes 2 episodes of bowel and bladder incontinence prompting her visit to urgent care.  They referred her to the emergency department for MRI.    Patient neurovascularly intact on exam.  She has been ambulatory with nursing in triage.  MRI obtained which does not show any evidence of cauda equina.  Mild discogenic degenerative changes noted to the lower lumbar spine with mild canal stenosis.  No high-grade foraminal or canal stenosis present.  Given reassuring imaging, I believe the patient is appropriate for continued outpatient follow-up.  We will symptomatically manage with anti-inflammatories, muscle relaxers, short course of pain medicine.  She has an orthopedist with whom she can follow-up regarding her pain.  Return precautions discussed  and provided. Patient discharged in stable condition with no unaddressed concerns.   Final Clinical Impressions(s) / ED Diagnoses   Final diagnoses:  Acute right-sided low back pain with right-sided sciatica    ED Discharge Orders        Ordered    naproxen (NAPROSYN) 500 MG tablet  2 times daily     09/14/17 0116    HYDROcodone-acetaminophen (NORCO/VICODIN) 5-325 MG tablet  Every 6 hours PRN     09/14/17 0116       Antony Madura, PA-C 09/14/17 0118    Palumbo, April, MD 09/14/17 0201

## 2017-09-15 ENCOUNTER — Ambulatory Visit: Payer: 59 | Admitting: Physician Assistant

## 2017-09-16 ENCOUNTER — Ambulatory Visit: Payer: 59 | Admitting: Physician Assistant

## 2017-09-17 ENCOUNTER — Emergency Department (HOSPITAL_BASED_OUTPATIENT_CLINIC_OR_DEPARTMENT_OTHER)
Admission: EM | Admit: 2017-09-17 | Discharge: 2017-09-17 | Disposition: A | Discharge status: Home / Self Care | Payer: 59 | Attending: Physician Assistant | Admitting: Physician Assistant

## 2017-09-17 ENCOUNTER — Encounter (HOSPITAL_BASED_OUTPATIENT_CLINIC_OR_DEPARTMENT_OTHER): Payer: Self-pay | Admitting: Emergency Medicine

## 2017-09-17 ENCOUNTER — Other Ambulatory Visit: Payer: Self-pay

## 2017-09-17 ENCOUNTER — Emergency Department (HOSPITAL_BASED_OUTPATIENT_CLINIC_OR_DEPARTMENT_OTHER): Payer: 59

## 2017-09-17 DIAGNOSIS — E1140 Type 2 diabetes mellitus with diabetic neuropathy, unspecified: Secondary | ICD-10-CM | POA: Diagnosis not present

## 2017-09-17 DIAGNOSIS — X58XXXA Exposure to other specified factors, initial encounter: Secondary | ICD-10-CM | POA: Diagnosis not present

## 2017-09-17 DIAGNOSIS — Y929 Unspecified place or not applicable: Secondary | ICD-10-CM | POA: Diagnosis not present

## 2017-09-17 DIAGNOSIS — Z79899 Other long term (current) drug therapy: Secondary | ICD-10-CM | POA: Diagnosis not present

## 2017-09-17 DIAGNOSIS — S39012A Strain of muscle, fascia and tendon of lower back, initial encounter: Secondary | ICD-10-CM | POA: Diagnosis not present

## 2017-09-17 DIAGNOSIS — Y998 Other external cause status: Secondary | ICD-10-CM | POA: Insufficient documentation

## 2017-09-17 DIAGNOSIS — F17210 Nicotine dependence, cigarettes, uncomplicated: Secondary | ICD-10-CM | POA: Diagnosis not present

## 2017-09-17 DIAGNOSIS — S3992XA Unspecified injury of lower back, initial encounter: Secondary | ICD-10-CM | POA: Diagnosis present

## 2017-09-17 DIAGNOSIS — R52 Pain, unspecified: Secondary | ICD-10-CM

## 2017-09-17 DIAGNOSIS — Y9389 Activity, other specified: Secondary | ICD-10-CM | POA: Diagnosis not present

## 2017-09-17 DIAGNOSIS — Z794 Long term (current) use of insulin: Secondary | ICD-10-CM | POA: Diagnosis not present

## 2017-09-17 DIAGNOSIS — M25551 Pain in right hip: Secondary | ICD-10-CM | POA: Diagnosis not present

## 2017-09-17 MED ORDER — METHYLPREDNISOLONE 4 MG PO TBPK: ORAL_TABLET | ORAL | 0 refills | Status: DC

## 2017-09-17 NOTE — ED Triage Notes (Signed)
Low back pain x 1 week. Seen at [facility] on Wednesday and had neg MRI. Pt states pain is worsening.

## 2017-09-17 NOTE — Discharge Instructions (Signed)
You were seen today with back pain.  You have had a normal MRI in the last week.  Please continue to take care of yourself symptomatically.  You can use this Medrol Dosepak.  We recommend that you follow-up with a specialist in the future and for any additional pain medication or work note needs.

## 2017-09-17 NOTE — ED Provider Notes (Addendum)
[facility] EMERGENCY DEPARTMENT Provider Note   CSN: [id number] Arrival date & time: 09/17/17  1105     History   Chief Complaint Chief Complaint  Patient presents with  . Back Pain    HPI Kelsey Vang is a 42 y.o. female.  HPI   Patient is a 42 year old female presenting with back pain.  Patient is alreadt been seen for back pain this week at [facility].  She had an MRI which was negative for any acute acute pathology.  Patient reports she feels like the pain is continued.  She reports she does not think she can go to work tomorrow.  She reports sometimes the pain radiates down into her right hip.  Patient reports her she feels like she cannot lay on her right side at all.  She denies any dysuria, any vaginal discharge.  Reports a pulling-like sensation.  Patient reports she is tried lidocaine, Flexeril, Percocet, and nothing is helping.  Patient has no weakness.  No change in sensation.  No red flags. Past Medical History:  Diagnosis Date  . Diabetes mellitus without complication (HCC)   . Hyperlipemia     Patient Active Problem List   Diagnosis Date Noted  . Tachycardia 01/04/2017  . Migraine with aura and without status migrainosus, not intractable 01/04/2017  . Palpitations 01/04/2017  . Obstructive apnea 04/23/2015  . Type 2 diabetes mellitus with diabetic polyneuropathy, with long-term current use of insulin (HCC) 12/27/2013  . Current tobacco use 12/27/2013  . Morbid (severe) obesity due to excess calories (HCC) 12/27/2013  . HLD (hyperlipidemia) 12/27/2013    Past Surgical History:  Procedure Laterality Date  . CHOLECYSTECTOMY    . ENDOMETRIAL ABLATION    . GALLBLADDER SURGERY    . TUBAL LIGATION       OB History   None      Home Medications    Prior to Admission medications   Medication Sig Start Date End Date Taking? Authorizing Provider  ACCU-CHEK FASTCLIX LANCETS MISC Use to check sugars 2-3 times daily 04/17/17   Carlus Pavlov, MD  AJOVY 225 MG/1.5ML SOSY INJECT 225MG  INTO THE SKIN ONCE A MONTH 08/11/17   [provider]  atorvastatin (LIPITOR) 80 MG tablet Take 1 tablet (80 mg total) by mouth daily. 09/10/17   Doristine Bosworth, MD  cetirizine (ZYRTEC) 10 MG tablet Take 10 mg by mouth daily.    [provider]  Cholecalciferol (VITAMIN D) 2000 units CAPS Take 1 capsule by mouth daily.    [provider]  Coenzyme Q10 (CO Q 10) 100 MG CAPS Take 1 capsule by mouth daily.    [provider]  cyclobenzaprine (FLEXERIL) 10 MG tablet Take 1 tablet (10 mg total) by mouth 3 (three) times daily as needed for muscle spasms. 11/10/16   Trena Platt D, PA  diclofenac sodium (VOLTAREN) 1 % GEL Apply 2 g topically 4 (four) times daily. 04/03/17   Doristine Bosworth, MD  ezetimibe (ZETIA) 10 MG tablet Take 1 tablet (10 mg total) by mouth daily. 09/09/17   Doristine Bosworth, MD  fluticasone (FLONASE) 50 MCG/ACT nasal spray Place 1 spray into both nostrils 2 (two) times daily.    [provider]  glucose blood (ACCU-CHEK GUIDE) test strip Use as instructed 04/17/17   Carlus Pavlov, MD  HYDROcodone-acetaminophen (NORCO/VICODIN) 5-325 MG tablet Take 1-2 tablets by mouth every 6 (six) hours as needed. 09/14/17   Antony Madura, PA-C  insulin glargine (LANTUS) 100  UNIT/ML injection Inject 0.4 mLs (40 Units total) into the skin at bedtime. Pens please. 05/26/17   Carlus Pavlov, MD  insulin lispro (HUMALOG) 100 UNIT/ML injection Inject under skin 20-25 before the 3 meals. Pens please. 08/25/17   Carlus Pavlov, MD  Insulin Pen Needle 32G X 4 MM MISC Use to inject insulin 4 times daily 12/26/16   Carlus Pavlov, MD  lisinopril (PRINIVIL,ZESTRIL) 5 MG tablet TAKE 1 TABLET BY MOUTH ONCE DAILY 03/17/17   Collie Siad A, MD  magnesium oxide (MAG-OX) 400 MG tablet Take 400 mg by mouth daily.    [provider]  metFORMIN (GLUCOPHAGE-XR) 500 MG 24 hr tablet Take 2 tablets (1,000 mg  total) by mouth 2 (two) times daily with a meal. 05/26/17   Carlus Pavlov, MD  metoprolol succinate (TOPROL XL) 25 MG 24 hr tablet Take 1 tablet (25 mg total) by mouth daily. 05/03/17   Chilton Si, MD  montelukast (SINGULAIR) 10 MG tablet Take 1 tablet (10 mg total) by mouth at bedtime. 08/17/16   Doristine Bosworth, MD  naproxen (NAPROSYN) 500 MG tablet Take 1 tablet (500 mg total) by mouth 2 (two) times daily. 09/14/17   Antony Madura, PA-C  Omega-3 Fatty Acids (FISH OIL) 1000 MG CAPS Take 1 capsule by mouth 2 (two) times daily.    [provider]  pantoprazole (PROTONIX) 40 MG tablet Take 1 tablet (40 mg total) by mouth 2 (two) times daily before a meal. 08/18/17   Stallings, Zoe A, MD  sitaGLIPtin (JANUVIA) 100 MG tablet Take 1 tablet (100 mg total) by mouth daily. 05/26/17   Carlus Pavlov, MD  SUMAtriptan (IMITREX) 100 MG tablet Take 1 tablet at earliest onset of migraine.  May repeat once in 2 hours if headache persists or recurs. 06/30/17   Drema Dallas, DO    Family History Family History  Problem Relation Age of Onset  . Cancer Mother   . Diabetes Father   . Liver disease Father   . Heart attack Maternal Grandmother   . Cancer Paternal Grandfather     Social History Social History   Tobacco Use  . Smoking status: Current Every Day Smoker    Packs/day: 0.50    Years: 20.00    Pack years: 10.00    Types: Cigarettes  . Smokeless tobacco: Never Used  Substance Use Topics  . Alcohol use: No  . Drug use: No     Allergies   Gabapentin; Levemir [insulin detemir]; and Keflex [cephalexin]   Review of Systems Review of Systems  Constitutional: Negative for fever.  Genitourinary: Negative for difficulty urinating and dysuria.  Musculoskeletal: Positive for back pain.  Neurological: Negative for weakness.  All other systems reviewed and are negative.    Physical Exam Updated Vital Signs BP 119/71 (BP Location: Left Arm)   Pulse 92   Temp 98.1 F (36.7 C)  (Oral)   Resp 20   Ht 5' 1.5" (1.562 m)   Wt 133.8 kg (295 lb)   LMP 09/01/2017   SpO2 98%   BMI 54.84 kg/m   Physical Exam  Constitutional: She is oriented to person, place, and time. She appears well-developed and well-nourished.  HENT:  Head: Normocephalic and atraumatic.  Eyes: Right eye exhibits no discharge. Left eye exhibits no discharge.  Cardiovascular: Normal rate, regular rhythm and normal heart sounds.  No murmur heard. Abdominal: Soft. She exhibits no distension. There is no tenderness.  Musculoskeletal:  Mild tenderness over her right hip.  Internal  and external rotation showed trace amount of pain, no erythema.  Neurological: She is oriented to person, place, and time.  Skin: Skin is warm and dry. She is not diaphoretic.  Psychiatric: She has a normal mood and affect.  Nursing note and vitals reviewed.    ED Treatments / Results  Labs (all labs ordered are listed, but only abnormal results are displayed) Labs Reviewed - No data to display  EKG None  Radiology No results found.  Procedures Procedures (including critical care time)  Medications Ordered in ED Medications - No data to display   Initial Impression / Assessment and Plan / ED Course  I have reviewed the triage vital signs and the nursing notes.  Pertinent labs & imaging results that were available during my care of the patient were reviewed by me and considered in my medical decision making (see chart for details).      Patient is a 42 year old female presenting with back pain.  Patient is alreadt been seen for back pain this week at [facility].  She had an MRI which was negative for any acute acute pathology.  Patient reports she feels like the pain is continued.  She reports she does not think she can go to work tomorrow.  She reports sometimes the pain radiates down into her right hip.  Patient reports her she feels like she cannot lay on her right side at all.  She denies any  dysuria, any vaginal discharge.  Reports a pulling-like sensation.  Patient reports she is tried lidocaine, Flexeril, Percocet, and nothing is helping.  Patient has no weakness.  No change in sensation.  No red flags.  2:02 PM Will get pelvis x-ray and right hip to make sure there is no subacute fracture given patient's morbid obesity.  I discussed that weight loss may help decrease the amount of stress on her hips and back.  We discussed to continue heat and ice.  Patient requesting steroids.  We will give her a Medrol Dosepak.  And requesting work note, will oblige.   2:23 PM X-ray of hips were negative for acute pathology.  Patient seen ambulating without pain to the bathroom.  We will have her follow-up with orthopedics.   Final Clinical Impressions(s) / ED Diagnoses   Final diagnoses:  Pain    ED Discharge Orders    None       Abelino Derrick, MD 09/17/17 1423    Abelino Derrick, MD 09/17/17 1424

## 2017-09-23 ENCOUNTER — Ambulatory Visit: Payer: 59 | Admitting: Family Medicine

## 2017-09-23 ENCOUNTER — Encounter: Payer: Self-pay | Admitting: Family Medicine

## 2017-09-23 ENCOUNTER — Ambulatory Visit (HOSPITAL_BASED_OUTPATIENT_CLINIC_OR_DEPARTMENT_OTHER)
Admission: RE | Admit: 2017-09-23 | Discharge: 2017-09-23 | Disposition: A | Discharge status: Home / Self Care | Payer: 59 | Source: Ambulatory Visit | Attending: Family Medicine | Admitting: Family Medicine

## 2017-09-23 ENCOUNTER — Other Ambulatory Visit: Payer: Self-pay

## 2017-09-23 VITALS — BP 128/82 | HR 110 | Temp 98.9°F | Resp 16 | Ht 61.0 in | Wt 291.4 lb

## 2017-09-23 DIAGNOSIS — I700 Atherosclerosis of aorta: Secondary | ICD-10-CM | POA: Insufficient documentation

## 2017-09-23 DIAGNOSIS — R102 Pelvic and perineal pain: Secondary | ICD-10-CM | POA: Diagnosis not present

## 2017-09-23 DIAGNOSIS — E1142 Type 2 diabetes mellitus with diabetic polyneuropathy: Secondary | ICD-10-CM

## 2017-09-23 DIAGNOSIS — Z794 Long term (current) use of insulin: Secondary | ICD-10-CM

## 2017-09-23 DIAGNOSIS — R634 Abnormal weight loss: Secondary | ICD-10-CM

## 2017-09-23 DIAGNOSIS — R1084 Generalized abdominal pain: Secondary | ICD-10-CM

## 2017-09-23 DIAGNOSIS — R109 Unspecified abdominal pain: Secondary | ICD-10-CM | POA: Diagnosis not present

## 2017-09-23 DIAGNOSIS — R1031 Right lower quadrant pain: Secondary | ICD-10-CM

## 2017-09-23 DIAGNOSIS — M5442 Lumbago with sciatica, left side: Secondary | ICD-10-CM

## 2017-09-23 DIAGNOSIS — M5441 Lumbago with sciatica, right side: Secondary | ICD-10-CM

## 2017-09-23 LAB — POCT URINALYSIS DIP (MANUAL ENTRY)
BILIRUBIN UA: NEGATIVE
BILIRUBIN UA: NEGATIVE mg/dL
Blood, UA: NEGATIVE
Glucose, UA: NEGATIVE mg/dL
Leukocytes, UA: NEGATIVE
Nitrite, UA: NEGATIVE
PH UA: 6 (ref 5.0–8.0)
PROTEIN UA: NEGATIVE mg/dL
SPEC GRAV UA: 1.025 (ref 1.010–1.025)
Urobilinogen, UA: 0.2 E.U./dL

## 2017-09-23 LAB — POCT WET + KOH PREP
Trich by wet prep: ABSENT
Yeast by KOH: ABSENT
Yeast by wet prep: ABSENT

## 2017-09-23 LAB — POC MICROSCOPIC URINALYSIS (UMFC): Mucus: ABSENT

## 2017-09-23 LAB — POCT CBC
Granulocyte percent: 66.3 %G (ref 37–80)
HEMATOCRIT: 43.4 % (ref 37.7–47.9)
Hemoglobin: 14.1 g/dL (ref 12.2–16.2)
LYMPH, POC: 4.4 — AB (ref 0.6–3.4)
MCH: 30 pg (ref 27–31.2)
MCHC: 32.5 g/dL (ref 31.8–35.4)
MCV: 92.1 fL (ref 80–97)
MID (CBC): 0.9 (ref 0–0.9)
MPV: 7.7 fL (ref 0–99.8)
POC GRANULOCYTE: 10.3 — AB (ref 2–6.9)
POC LYMPH PERCENT: 28.2 %L (ref 10–50)
POC MID %: 5.5 % (ref 0–12)
Platelet Count, POC: 432 10*3/uL — AB (ref 142–424)
RBC: 4.71 M/uL (ref 4.04–5.48)
RDW, POC: 15.3 %
WBC: 15.5 10*3/uL — AB (ref 4.6–10.2)

## 2017-09-23 LAB — GLUCOSE, POCT (MANUAL RESULT ENTRY): POC GLUCOSE: 296 mg/dL — AB (ref 70–99)

## 2017-09-23 LAB — POCT URINE PREGNANCY: Preg Test, Ur: NEGATIVE

## 2017-09-23 MED ORDER — OXYCODONE-ACETAMINOPHEN 5-325 MG PO TABS: 1.0000 | ORAL_TABLET | ORAL | 0 refills | Status: DC | PRN

## 2017-09-23 MED ORDER — IOPAMIDOL (ISOVUE-300) INJECTION 61%
100.0000 mL | Freq: Once | INTRAVENOUS | Status: AC | PRN
Start: 2017-09-23 — End: 2017-09-23
  Administered 2017-09-23: 100 mL via INTRAVENOUS

## 2017-09-23 MED ORDER — CEFTRIAXONE SODIUM 1 G IJ SOLR
1.0000 g | Freq: Once | INTRAMUSCULAR | Status: AC
  Administered 2017-09-23: 1 g via INTRAMUSCULAR

## 2017-09-23 MED ORDER — IOPAMIDOL (ISOVUE-300) INJECTION 61%: 30.0000 mL | Freq: Once | INTRAVENOUS | Status: DC | PRN

## 2017-09-23 NOTE — Progress Notes (Signed)
Subjective:  By signing my name below, I, Kelsey Vang, attest that this documentation has been prepared under the direction and in the presence of Kelsey Sorenson, MD Electronically Signed: Charline Bills, ED Scribe 09/23/2017 at 2:10 PM.   Patient ID: Kelsey Vang, female    DOB: January 05, 1976, 42 y.o.   MRN: [id number]  Chief Complaint  Patient presents with  . Abdominal Pain    X 2 WEEKS lower  . Back Pain     x 6 weeks lower was at  ED 09/13/17   HPI Kelsey Vang is a 42 y.o. female who presents to Primary Care at [facility] complaining of abdominal pain and back pain. Seen 2 wks ago for CPE. H/o TIIDM insulin dependent with neuropathy, ongoing tobacco use, migraines, sleep apnea, severe obesity with BMI over 50. Then seen 10 days prior at [facility] with 3 days of low back pain and multiple episodes of urinary and bowel incontinence. Significant leakage and hesitancy. Therefore, she was transferred to ER for MRI which did not show any cauda equina. Mild DDD and stenosis. Treated with antiinflammatory, muscle relaxant, pain meds and f/u with ortho which whom she was already established. She returned to ER 6 days prior as severity of pain continued with R sciatica. Prescribed naproxen 500 #30 and hydrocodone 5 #12 but during ER visit she was given decadron and Toradol, 2 tabs oxycodone 5 and lidocaine patch. She also reported trying flexeril and none of this provided any relief. Pelvis and R hip XR obtained. Advised weight loss, heat and ice. Prescribed medrol dose pack at pt request and note out of work.  Today, she reports unchanged lower abdominal pain x 2 wks and lower back pain x 6 wks that radiates into R hip. Pt reports that pain is increased with driving over speed bumps and specifically RLQ pain with lying supine. Reports urinary urgency and bladder incontinence x 3 wks. She has tried heat, ice, prednisone, flexeril every 8 hrs and finished medrol dose pack yesterday morning. States she  called Dr. Elvera Lennox who adjusted her insulin yesterday. She is taking 25 units of Humalog. Denies fever, polydipsia, nausea, vomiting, decreased appetite, pain with BMs, hematuria, diarrhea, constipation, heartburn, indigestion, abdominal bloating. Has not had a pelvic exam in a while; states she is trying to get set up with [facility] for menometrorrhagia with severe dysmenorrhea. Pt is sexually active.  Past Medical History:  Diagnosis Date  . Diabetes mellitus without complication (HCC)   . Hyperlipemia    Current Outpatient Medications on File Prior to Visit  Medication Sig Dispense Refill  . AJOVY 225 MG/1.5ML SOSY INJECT 225MG  INTO THE SKIN ONCE A MONTH  11  . atorvastatin (LIPITOR) 80 MG tablet Take 1 tablet (80 mg total) by mouth daily. 90 tablet 3  . cetirizine (ZYRTEC) 10 MG tablet Take 10 mg by mouth daily.    . Cholecalciferol (VITAMIN D) 2000 units CAPS Take 1 capsule by mouth daily.    . Coenzyme Q10 (CO Q 10) 100 MG CAPS Take 1 capsule by mouth daily.    . cyclobenzaprine (FLEXERIL) 10 MG tablet Take 1 tablet (10 mg total) by mouth 3 (three) times daily as needed for muscle spasms. 90 tablet 0  . diclofenac sodium (VOLTAREN) 1 % GEL Apply 2 g topically 4 (four) times daily. 100 g 0  . ezetimibe (ZETIA) 10 MG tablet Take 1 tablet (10 mg total) by mouth daily. 90 tablet 3  . fluticasone (  FLONASE) 50 MCG/ACT nasal spray Place 1 spray into both nostrils 2 (two) times daily.    . insulin glargine (LANTUS) 100 UNIT/ML injection Inject 0.4 mLs (40 Units total) into the skin at bedtime. Pens please. 45 mL 3  . insulin lispro (HUMALOG) 100 UNIT/ML injection Inject under skin 20-25 before the 3 meals. Pens please. 60 mL 3  . lisinopril (PRINIVIL,ZESTRIL) 5 MG tablet TAKE 1 TABLET BY MOUTH ONCE DAILY 90 tablet 1  . magnesium oxide (MAG-OX) 400 MG tablet Take 400 mg by mouth daily.    . metFORMIN (GLUCOPHAGE-XR) 500 MG 24 hr tablet Take 2 tablets (1,000 mg total) by mouth 2 (two)  times daily with a meal. 360 tablet 3  . metoprolol succinate (TOPROL XL) 25 MG 24 hr tablet Take 1 tablet (25 mg total) by mouth daily. 90 tablet 3  . montelukast (SINGULAIR) 10 MG tablet Take 1 tablet (10 mg total) by mouth at bedtime. 90 tablet 3  . naproxen (NAPROSYN) 500 MG tablet Take 1 tablet (500 mg total) by mouth 2 (two) times daily. 30 tablet 0  . Omega-3 Fatty Acids (FISH OIL) 1000 MG CAPS Take 1 capsule by mouth 2 (two) times daily.    . pantoprazole (PROTONIX) 40 MG tablet Take 1 tablet (40 mg total) by mouth 2 (two) times daily before a meal. 180 tablet 1  . sitaGLIPtin (JANUVIA) 100 MG tablet Take 1 tablet (100 mg total) by mouth daily. 90 tablet 3  . SUMAtriptan (IMITREX) 100 MG tablet Take 1 tablet at earliest onset of migraine.  May repeat once in 2 hours if headache persists or recurs. 10 tablet 2  . ACCU-CHEK FASTCLIX LANCETS MISC Use to check sugars 2-3 times daily 102 each 11  . glucose blood (ACCU-CHEK GUIDE) test strip Use as instructed 100 each 12  . HYDROcodone-acetaminophen (NORCO/VICODIN) 5-325 MG tablet Take 1-2 tablets by mouth every 6 (six) hours as needed. (Patient not taking: Reported on 09/23/2017) 12 tablet 0  . Insulin Pen Needle 32G X 4 MM MISC Use to inject insulin 4 times daily 400 each 5  . methylPREDNISolone (MEDROL DOSEPAK) 4 MG TBPK tablet Take as directed on package (Patient not taking: Reported on 09/23/2017) 21 tablet 0   No current facility-administered medications on file prior to visit.    Past Surgical History:  Procedure Laterality Date  . CHOLECYSTECTOMY    . ENDOMETRIAL ABLATION    . GALLBLADDER SURGERY    . TUBAL LIGATION     Allergies  Allergen Reactions  . Gabapentin Other (See Comments)  . Levemir [Insulin Detemir] Dermatitis    Whelps at injection site  . Keflex [Cephalexin] Rash   Family History  Problem Relation Age of Onset  . Cancer Mother   . Diabetes Father   . Liver disease Father   . Heart attack Maternal Grandmother     . Cancer Paternal Grandfather    Social History   Socioeconomic History  . Marital status: Married    Spouse name: Molli Hazard  . Number of children: 1  . Years of education: Not on file  . Highest education level: Not on file  Occupational History  . Not on file  Social Needs  . Financial resource strain: Not on file  . Food insecurity:    Worry: Not on file    Inability: Not on file  . Transportation needs:    Medical: Not on file    Non-medical: Not on file  Tobacco Use  . Smoking  status: Current Every Day Smoker    Packs/day: 0.50    Years: 20.00    Pack years: 10.00    Types: Cigarettes  . Smokeless tobacco: Never Used  Substance and Sexual Activity  . Alcohol use: No  . Drug use: No  . Sexual activity: Yes  Lifestyle  . Physical activity:    Days per week: Not on file    Minutes per session: Not on file  . Stress: Not on file  Relationships  . Social connections:    Talks on phone: Not on file    Gets together: Not on file    Attends religious service: Not on file    Active member of club or organization: Not on file    Attends meetings of clubs or organizations: Not on file    Relationship status: Not on file  Other Topics Concern  . Not on file  Social History Narrative  . Not on file   Depression screen [facility] 2/9 09/23/2017 09/09/2017 09/04/2017 06/30/2017 05/18/2017  Decreased Interest 0 0 0 0 0  Down, Depressed, Hopeless 0 0 0 0 0  PHQ - 2 Score 0 0 0 0 0     Review of Systems  Constitutional: Negative for fever.  Gastrointestinal: Positive for abdominal pain. Negative for abdominal distention, constipation, diarrhea, nausea and vomiting.  Endocrine: Negative for polydipsia.  Genitourinary: Positive for menstrual problem and urgency. Negative for hematuria.  Musculoskeletal: Positive for back pain.      Objective:   Physical Exam  Constitutional: She is oriented to person, place, and time. She appears well-developed and well-nourished. No distress.   HENT:  Head: Normocephalic and atraumatic.  Eyes: Conjunctivae and EOM are normal.  Neck: Neck supple. No tracheal deviation present.  Cardiovascular: Normal rate.  Pulmonary/Chest: Effort normal. No respiratory distress.  Abdominal: Bowel sounds are normal. There is tenderness in the right lower quadrant, suprapubic area and left lower quadrant. There is guarding. There is no rebound.  Severe pain suprapubic and bilateral lower quadrants. Pos referred pain from LLQ and RUQ to RLQ. Mild intertrigo. Not infected.  Genitourinary: Cervix exhibits motion tenderness.  Genitourinary Comments: Chaperone present. Pos CMT. Pos tenderness with palpation of uterus and adnexa. Unable to appreciate masses or fullness due to body habitus. No discharge, odor, rashes or irritation.  Musculoskeletal: Normal range of motion.  Neurological: She is alert and oriented to person, place, and time.  Skin: Skin is warm and dry.  Psychiatric: She has a normal mood and affect. Her behavior is normal.  Nursing note and vitals reviewed.  BP 128/82 (BP Location: Right Arm)   Pulse (!) 110   Temp 98.9 F (37.2 C) (Oral)   Resp 16   Ht 5\' 1"  (1.549 m)   Wt 291 lb 6.4 oz (132.2 kg)   LMP 09/01/2017   SpO2 94%   BMI 55.06 kg/m     Assessment & Plan:   1. Right lower quadrant abdominal pain   2. Type 2 diabetes mellitus with diabetic polyneuropathy, with long-term current use of insulin (HCC)   3. Acute bilateral low back pain with bilateral sciatica   4. Generalized abdominal pain - sent pt for stat abd/pelvic CT to r/o appendicitis to [facility] on Sat afternoon - ddx inc ovarian cyst, ovarian mass, atypical diverticulitis, PID.   5. Abnormal weight loss - lost 5.5 lbs in the past 2 wks of abd pain since her CPE.   6. Acute pelvic pain, female -  on pelvic exam - pt with sig pain on palpation of uterus - pap, gc/chlam, UClx P. Does have elevated WBC currently as well (as she has at past sev checks) -  so will cover with Rocephin 1g now - will cover for gonorrhea as well as start trx for poss PID in case STI labs return + or signs of inflammation are seen around uterus/pelvis on CT    Orders Placed This Encounter  Procedures  . Urine Culture  . CT Abdomen Pelvis W Contrast    Standing Status:   Future    Number of Occurrences:   1    Standing Expiration Date:   12/25/2018    Order Specific Question:   If indicated for the ordered procedure, I authorize the administration of contrast media per Radiology protocol    Answer:   Yes    Order Specific Question:   Is patient pregnant?    Answer:   No    Order Specific Question:   Preferred imaging location?    Answer:   Furniture conservator/restorer Specific Question:   Is Oral Contrast requested for this exam?    Answer:   Yes, Per Radiology protocol    Order Specific Question:   Call Results- Best Contact Number?    Answer:   [phone]    Comments:   Dr. Clelia Croft cell    Order Specific Question:   Radiology Contrast Protocol - do NOT remove file path    Answer:   \\[email or web address].[email or web address]  . Comprehensive metabolic panel  . Lipase  . POCT urinalysis dipstick  . POCT Microscopic Urinalysis ([facility])  . POCT glucose (manual entry)  . POCT CBC  . POCT Wet + KOH Prep  . POCT urine pregnancy    Meds ordered this encounter  Medications  . cefTRIAXone (ROCEPHIN) injection 1 g  . oxyCODONE-acetaminophen (PERCOCET/ROXICET) 5-325 MG tablet    Sig: Take 1-2 tablets by mouth every 4 (four) hours as needed for severe pain.    Dispense:  20 tablet    Refill:  0    I personally performed the services described in this documentation, which was scribed in my presence. The recorded information has been reviewed and considered, and addended by me as needed.   Kelsey Vang, M.D.  Primary Care at [facility] [address] [place], [place] [address] [phone] phone [phone] fax  09/29/17 10:47 PM

## 2017-09-23 NOTE — Patient Instructions (Addendum)
GO TO [facility] ER AND CHECK INTO THE IMAGING CENTER - NOT TO THE ER - FOR YOUR ABDOMINAL/PELVIC CT - THEY WILL KEEP YOU THERE UNTIL I HAVE THE CT RESULTS AND TELL THEM WHETHER YOU CAN GO HOME OR NEED TO GO CHECK INTO THE ER.    IF you received an x-ray today, you will receive an invoice from [facility]. Please contact [facility] at [phone] with questions or concerns regarding your invoice.   IF you received labwork today, you will receive an invoice from [place]. Please contact [facility] at [phone] with questions or concerns regarding your invoice.   Our billing staff will not be able to assist you with questions regarding bills from these companies.  You will be contacted with the lab results as soon as they are available. The fastest way to get your results is to activate your My Chart account. Instructions are located on the last page of this paperwork. If you have not heard from [place] regarding the results in 2 weeks, please contact this office.     Pelvic Pain, Female Pelvic pain is pain in your lower abdomen, below your belly button and between your hips. The pain may start suddenly (acute), keep coming back (recurring), or last a long time (chronic). Pelvic pain that lasts longer than six months is considered chronic. Pelvic pain may affect your:  Reproductive organs.  Urinary system.  Digestive tract.  Musculoskeletal system.  There are many potential causes of pelvic pain. Sometimes, the pain can be a result of digestive or urinary conditions, strained muscles or ligaments, or even reproductive conditions. Sometimes the cause of pelvic pain is not known. Follow these instructions at home:  Take over-the-counter and prescription medicines only as told by your health care provider.  Rest as told by your health care provider.  Do not have sex it if hurts.  Keep a journal of your pelvic pain. Write down: ? When the pain  started. ? Where the pain is located. ? What seems to make the pain better or worse, such as food or your menstrual cycle. ? Any symptoms you have along with the pain.  Keep all follow-up visits as told by your health care provider. This is important. Contact a health care provider if:  Medicine does not help your pain.  Your pain comes back.  You have new symptoms.  You have abnormal vaginal discharge or bleeding, including bleeding after menopause.  You have a fever or chills.  You are constipated.  You have blood in your urine or stool.  You have foul-smelling urine.  You feel weak or lightheaded. Get help right away if:  You have sudden severe pain.  Your pain gets steadily worse.  You have severe pain along with fever, nausea, vomiting, or excessive sweating.  You lose consciousness. This information is not intended to replace advice given to you by your health care provider. Make sure you discuss any questions you have with your health care provider. Document Released: 03/01/2004 Document Revised: 04/29/2015 Document Reviewed: 01/23/2015 Elsevier Interactive Patient Education  2018 [employer].

## 2017-09-24 LAB — COMPREHENSIVE METABOLIC PANEL
ALK PHOS: 117 IU/L (ref 39–117)
ALT: 45 IU/L — AB (ref 0–32)
AST: 75 IU/L — AB (ref 0–40)
Albumin/Globulin Ratio: 1.3 (ref 1.2–2.2)
Albumin: 3.9 g/dL (ref 3.5–5.5)
BUN/Creatinine Ratio: 16 (ref 9–23)
BUN: 13 mg/dL (ref 6–24)
Bilirubin Total: 0.3 mg/dL (ref 0.0–1.2)
CALCIUM: 9.7 mg/dL (ref 8.7–10.2)
CO2: 25 mmol/L (ref 20–29)
Chloride: 98 mmol/L (ref 96–106)
Creatinine, Ser: 0.83 mg/dL (ref 0.57–1.00)
GFR calc Af Amer: 101 mL/min/{1.73_m2} (ref >59)
GFR, EST NON AFRICAN AMERICAN: 88 mL/min/{1.73_m2} (ref >59)
Globulin, Total: 2.9 g/dL (ref 1.5–4.5)
Glucose: 279 mg/dL — ABNORMAL HIGH (ref 65–99)
Potassium: 4.8 mmol/L (ref 3.5–5.2)
SODIUM: 140 mmol/L (ref 134–144)
Total Protein: 6.8 g/dL (ref 6.0–8.5)

## 2017-09-24 LAB — LIPASE: LIPASE: 36 U/L (ref 14–72)

## 2017-09-24 LAB — URINE CULTURE

## 2017-09-25 ENCOUNTER — Encounter: Payer: Self-pay | Admitting: Family Medicine

## 2017-09-26 ENCOUNTER — Telehealth: Payer: Self-pay

## 2017-09-26 ENCOUNTER — Encounter: Payer: Self-pay | Admitting: Family Medicine

## 2017-09-26 NOTE — Telephone Encounter (Signed)
Copied from CRM [id number]. Topic: Inquiry >> Sep 25, 2017  8:51 AM Jay Schlichter wrote: Reason for CRM: [phone] Please call pt back with results and follow up instructions of ct.   Ask for Velda, this is work number

## 2017-09-26 NOTE — Telephone Encounter (Signed)
Pt called to check status; pt was notified of notes left and was told to wait for a call back from pcp or the nurse

## 2017-09-26 NOTE — Telephone Encounter (Signed)
Duplicate encounter - MyChart message from patient has been routed to provider. Closing note.

## 2017-09-28 ENCOUNTER — Telehealth: Payer: Self-pay | Admitting: Family Medicine

## 2017-09-28 LAB — PAP IG, CT-NG NAA, HPV HIGH-RISK
Chlamydia, Nuc. Acid Amp: NEGATIVE
GONOCOCCUS BY NUCLEIC ACID AMP: NEGATIVE
HPV, high-risk: NEGATIVE
PAP SMEAR COMMENT: 0

## 2017-09-28 NOTE — Telephone Encounter (Signed)
Pt came in asking about her CT results.  She has made several calls about this.  John printed the results and we gave them to her.  It does not look like its been reviewed and there were no notes.  The patient said to please call as soon as possible with the review and to let you know her pain is getting worse.  [phone]

## 2017-09-29 ENCOUNTER — Encounter: Payer: Self-pay | Admitting: Family Medicine

## 2017-09-29 ENCOUNTER — Telehealth: Payer: Self-pay | Admitting: Family Medicine

## 2017-09-29 MED FILL — HUMALOG 100 UNITS/ML KWIKPE: 100 | 80 days supply | Qty: 60 | Fill #0

## 2017-09-29 MED FILL — LANTUS SOLOSTAR 100 UNITS/M: 100 | 44 days supply | Qty: 15 | Fill #4

## 2017-09-29 MED FILL — UNIFINE PENTIPS 32GX5/32: 32G X 4 MM | 90 days supply | Qty: 400 | Fill #2

## 2017-09-29 MED FILL — UNIFINE PENTIPS 32GX5/32": 32G X 4 MM | 90 days supply | Qty: 400 | Fill #2

## 2017-09-29 NOTE — Telephone Encounter (Signed)
If her pain is getting worse then she needs to be seen again in the office - I have no way to explain her pain - CT and labs are all normal - and so no suggestions on how to treat it - if things are progressing, then needs acute OV for repeat clinical eval.

## 2017-09-29 NOTE — Telephone Encounter (Signed)
Copied from CRM [id number]. Topic: Inquiry >> Sep 29, 2017  3:52 PM Maia Petties wrote: Reason for CRM: pt calling because she saw abnormal lab results in mychart. Pt asking for recommendation and if medication will be sent to the pharmacy. Please call to advise.  [id number] - [place], [place] - [address] [phone] (Phone) [phone] (Fax)

## 2017-09-29 NOTE — Telephone Encounter (Signed)
Not sure to what exactly she is referring -   From her most recent visit, the abnormalities are insignificant - such as a dirty catch urine that did not grow out any bacteria that would cause infection, elevated blood glucose form her uncontrolled diabetes, minimal liver irritation likely from fatty liver but ok to do watchful waiting at this point as she works on diet/exercise. Her wbc were elevated as they have been for the past several checks but no source of infection has yet been identified so needs repeat eval for this before any reasonable treatment can be initiated.

## 2017-10-02 NOTE — Telephone Encounter (Signed)
Please seen note below.  No release to leave detailed message

## 2017-10-02 NOTE — Telephone Encounter (Signed)
See below note.  Unable to leave message no release

## 2017-10-03 NOTE — Telephone Encounter (Signed)
Pt. Returned call about email  My chart results questions.  She saw messages the dr. Rhina Brackett asking if you would call her back if there is something that is not in mychart.  She also stated you can leave a detailed message on her cell phone.

## 2017-10-04 NOTE — Telephone Encounter (Signed)
Left a detailed message for patient

## 2017-10-06 ENCOUNTER — Ambulatory Visit: Payer: 59 | Admitting: Neurology

## 2017-10-09 ENCOUNTER — Encounter: Payer: Self-pay | Admitting: Family Medicine

## 2017-10-09 DIAGNOSIS — R109 Unspecified abdominal pain: Secondary | ICD-10-CM | POA: Diagnosis not present

## 2017-10-11 MED FILL — CHANTIX 1 MG CONT MONTH BOX: 1 | 28 days supply | Qty: 56 | Fill #3

## 2017-10-11 MED FILL — AJOVY 225 MG/1.5ML SOSY: 225 | 30 days supply | Qty: 2 | Fill #3

## 2017-10-16 DIAGNOSIS — R102 Pelvic and perineal pain: Secondary | ICD-10-CM | POA: Diagnosis not present

## 2017-10-16 MED FILL — medroxyPROGESTERone ACETATE: 150 | 90 days supply | Qty: 1 | Fill #0

## 2017-10-16 MED FILL — TETRACYCLINE 250 MG CAPSULE: 250 | 7 days supply | Qty: 21 | Fill #0

## 2017-10-18 ENCOUNTER — Encounter: Payer: Self-pay | Admitting: Gastroenterology

## 2017-10-20 ENCOUNTER — Other Ambulatory Visit: Payer: Self-pay | Admitting: Family Medicine

## 2017-10-20 ENCOUNTER — Ambulatory Visit: Payer: 59 | Admitting: Neurology

## 2017-10-20 MED FILL — METFORMIN HCL ER 500 MG TAB: 500 | 90 days supply | Qty: 360 | Fill #1

## 2017-10-20 MED FILL — LISINOPRIL 5 MG TABLET: 5 | 90 days supply | Qty: 90 | Fill #0

## 2017-10-20 MED FILL — JANUVIA 100 MG TABLET: 100 | 90 days supply | Qty: 90 | Fill #1

## 2017-11-05 ENCOUNTER — Telehealth: Payer: 59 | Admitting: Family

## 2017-11-05 DIAGNOSIS — R399 Unspecified symptoms and signs involving the genitourinary system: Secondary | ICD-10-CM | POA: Diagnosis not present

## 2017-11-05 MED ORDER — NITROFURANTOIN MONOHYD MACRO 100 MG PO CAPS
100.0000 mg | ORAL_CAPSULE | Freq: Two times a day (BID) | ORAL | 0 refills | Status: DC
Start: 2017-11-05 — End: 2018-02-09

## 2017-11-05 NOTE — Progress Notes (Signed)

## 2017-11-06 DIAGNOSIS — G4733 Obstructive sleep apnea (adult) (pediatric): Secondary | ICD-10-CM | POA: Diagnosis not present

## 2017-11-07 MED FILL — AJOVY 225 MG/1.5ML SOSY: 225 | 30 days supply | Qty: 2 | Fill #4

## 2017-11-13 MED FILL — METOPROLOL SUCCINATE ER 25: 25 | 90 days supply | Qty: 90 | Fill #2

## 2017-11-13 MED FILL — PANTOPRAZOLE SOD DR 40 MG T: 40 | 90 days supply | Qty: 180 | Fill #1

## 2017-12-04 MED FILL — AJOVY 225 MG/1.5ML SOSY: 225 | 30 days supply | Qty: 2 | Fill #5

## 2017-12-04 MED FILL — ACCU-CHEK GUIDE TEST STRIP: 30 days supply | Qty: 100 | Fill #1

## 2017-12-04 MED FILL — ACCU-CHEK FASTCLIX LANCETS: 30 days supply | Qty: 102 | Fill #1

## 2017-12-06 ENCOUNTER — Encounter: Payer: Self-pay | Admitting: Gastroenterology

## 2017-12-06 ENCOUNTER — Telehealth: Payer: 59 | Admitting: Nurse Practitioner

## 2017-12-06 DIAGNOSIS — R252 Cramp and spasm: Secondary | ICD-10-CM | POA: Diagnosis not present

## 2017-12-06 DIAGNOSIS — M545 Low back pain, unspecified: Secondary | ICD-10-CM

## 2017-12-06 DIAGNOSIS — M5441 Lumbago with sciatica, right side: Secondary | ICD-10-CM

## 2017-12-06 MED ORDER — CYCLOBENZAPRINE HCL 10 MG PO TABS: 10.0000 mg | ORAL_TABLET | Freq: Three times a day (TID) | ORAL | 0 refills | Status: DC | PRN

## 2017-12-06 MED ORDER — NAPROXEN 500 MG PO TABS: 500.0000 mg | ORAL_TABLET | Freq: Two times a day (BID) | ORAL | 0 refills | Status: DC

## 2017-12-06 MED FILL — CYCLOBENZAPRINE 10 MG TAB: 10 | 30 days supply | Qty: 90 | Fill #0

## 2017-12-06 NOTE — Progress Notes (Signed)

## 2017-12-07 ENCOUNTER — Other Ambulatory Visit: Payer: Self-pay | Admitting: Nurse Practitioner

## 2017-12-07 MED ORDER — NAPROXEN 500 MG PO TABS: 500.0000 mg | ORAL_TABLET | Freq: Two times a day (BID) | ORAL | 0 refills | Status: DC

## 2017-12-07 MED FILL — NAPROXEN 500 MG TABLET: 500 | 15 days supply | Qty: 30 | Fill #0

## 2017-12-19 ENCOUNTER — Telehealth: Payer: 59 | Admitting: Family Medicine

## 2017-12-19 DIAGNOSIS — J019 Acute sinusitis, unspecified: Secondary | ICD-10-CM | POA: Diagnosis not present

## 2017-12-19 MED ORDER — DOXYCYCLINE HYCLATE 100 MG PO TABS: 100.0000 mg | ORAL_TABLET | Freq: Two times a day (BID) | ORAL | 0 refills | Status: DC

## 2017-12-19 MED FILL — LANTUS SOLOSTAR 100 UNITS/M: 100 | 44 days supply | Qty: 15 | Fill #5

## 2017-12-19 MED FILL — DOXYCYCLINE HYCLATE 100 MG: 100 | 10 days supply | Qty: 20 | Fill #0

## 2017-12-19 MED FILL — EZETIMIBE 10 MG TABLET: 10 | 90 days supply | Qty: 90 | Fill #1

## 2017-12-19 NOTE — Progress Notes (Signed)

## 2017-12-20 ENCOUNTER — Other Ambulatory Visit: Payer: Self-pay

## 2017-12-20 ENCOUNTER — Encounter: Payer: Self-pay | Admitting: Family Medicine

## 2017-12-20 ENCOUNTER — Ambulatory Visit (INDEPENDENT_AMBULATORY_CARE_PROVIDER_SITE_OTHER): Payer: 59

## 2017-12-20 ENCOUNTER — Ambulatory Visit: Payer: 59 | Admitting: Family Medicine

## 2017-12-20 VITALS — BP 121/88 | HR 106 | Temp 99.0°F | Resp 17 | Ht 61.0 in | Wt 276.8 lb

## 2017-12-20 DIAGNOSIS — Z794 Long term (current) use of insulin: Secondary | ICD-10-CM | POA: Diagnosis not present

## 2017-12-20 DIAGNOSIS — J019 Acute sinusitis, unspecified: Secondary | ICD-10-CM | POA: Diagnosis not present

## 2017-12-20 DIAGNOSIS — R519 Headache, unspecified: Secondary | ICD-10-CM

## 2017-12-20 DIAGNOSIS — E11628 Type 2 diabetes mellitus with other skin complications: Secondary | ICD-10-CM | POA: Diagnosis not present

## 2017-12-20 DIAGNOSIS — L03211 Cellulitis of face: Secondary | ICD-10-CM | POA: Diagnosis not present

## 2017-12-20 DIAGNOSIS — R51 Headache: Secondary | ICD-10-CM

## 2017-12-20 DIAGNOSIS — J0100 Acute maxillary sinusitis, unspecified: Secondary | ICD-10-CM

## 2017-12-20 LAB — POCT CBC
GRANULOCYTE PERCENT: 71.3 % (ref 37–80)
HCT, POC: 42.5 % (ref 37.7–47.9)
Hemoglobin: 14.4 g/dL (ref 12.2–16.2)
Lymph, poc: 1.7 (ref 0.6–3.4)
MCH: 30.3 pg (ref 27–31.2)
MCHC: 33.9 g/dL (ref 31.8–35.4)
MCV: 89.3 fL (ref 80–97)
MID (CBC): 0.7 (ref 0–0.9)
MPV: 7.8 fL (ref 0–99.8)
PLATELET COUNT, POC: 244 10*3/uL (ref 142–424)
POC GRANULOCYTE: 6.1 (ref 2–6.9)
POC LYMPH PERCENT: 20.1 %L (ref 10–50)
POC MID %: 8.6 %M (ref 0–12)
RBC: 4.76 M/uL (ref 4.04–5.48)
RDW, POC: 14.4 %
WBC: 8.5 10*3/uL (ref 4.6–10.2)

## 2017-12-20 LAB — GLUCOSE, POCT (MANUAL RESULT ENTRY): POC Glucose: 174 mg/dl — AB (ref 70–99)

## 2017-12-20 MED ORDER — CLINDAMYCIN HCL 300 MG PO CAPS: 300.0000 mg | ORAL_CAPSULE | Freq: Three times a day (TID) | ORAL | 0 refills | Status: DC

## 2017-12-20 MED ORDER — HYDROCODONE-ACETAMINOPHEN 5-325 MG PO TABS: 1.0000 | ORAL_TABLET | Freq: Four times a day (QID) | ORAL | 0 refills | Status: DC | PRN

## 2017-12-20 NOTE — Progress Notes (Signed)
Patient ID: Kelsey Vang, female    DOB: April 14, 1976  Age: 42 y.o. MRN: [id number]  Chief Complaint  Patient presents with  . Sinusitis    intermittent x past 2 weeks per pt, started doxycycline twice a day on yesterday but swelling is worsening  . abcess left side of face x yesterday    Subjective:   42 year old lady presenting with the history of having had sinus congestion for the past couple of weeks.  She started feeling acutely worse yesterday.  She did call the insurance company phone treatment nurse and was given a course of doxycycline, has taken 3 doses.  Today the face was worse with swelling and pain below her left eye.  She can hardly open her left eye this morning though it does open now.  She had no earache.  Minimal rhinorrhea.  No sore throat or cough.  She had something milder than this a few years ago.  She does not smoke.  She is diabetic.  Current allergies, medications, problem list, past/family and social histories reviewed.  Objective:  BP 121/88 (BP Location: Left Arm, Patient Position: Sitting, Cuff Size: Large)   Pulse (!) 106   Temp 99 F (37.2 C) (Oral)   Resp 17   Ht 5\' 1"  (1.549 m)   Wt 276 lb 12.8 oz (125.6 kg)   SpO2 97%   BMI 52.30 kg/m   Temperature of 99 is noted.  Her TMs are normal.  Nose clear.  Left cheek area is obviously swollen.  It is a little erythematous.  There is tenderness and induration of the tissue just lateral to the left nasal ala.  Lateral to that and below her eye the whole area is swollen and tender in a four 5 cm area.  Throat is clear.  No evidence of dental issues.  Does not hurt to bite down on a tongue blade.  Neck supple with some small nodes.  Assessment & Plan:   Assessment: 1. Facial cellulitis   2. Acute non-recurrent maxillary sinusitis   3. Type 2 diabetes mellitus with other skin complication, with long-term current use of insulin (HCC)   4. Facial pain       Plan: CBC and sinus x-ray.  She is diabetic so  I am checking sugar    X-rays are most consistent with a facial cellulitis  Results for orders placed or performed in visit on 12/20/17  POCT CBC  Result Value Ref Range   WBC 8.5 4.6 - 10.2 K/uL   Lymph, poc 1.7 0.6 - 3.4   POC LYMPH PERCENT 20.1 10 - 50 %L   MID (cbc) 0.7 0 - 0.9   POC MID % 8.6 0 - 12 %M   POC Granulocyte 6.1 2 - 6.9   Granulocyte percent 71.3 37 - 80 %G   RBC 4.76 4.04 - 5.48 M/uL   Hemoglobin 14.4 12.2 - 16.2 g/dL   HCT, POC 93.8 10.1 - 47.9 %   MCV 89.3 80 - 97 fL   MCH, POC 30.3 27 - 31.2 pg   MCHC 33.9 31.8 - 35.4 g/dL   RDW, POC 75.1 %   Platelet Count, POC 244 142 - 424 K/uL   MPV 7.8 0 - 99.8 fL  POCT glucose (manual entry)  Result Value Ref Range   POC Glucose 174 (A) 70 - 99 mg/dl   .  Orders Placed This Encounter  Procedures  . DG Sinuses Complete    Standing Status:  Future    Number of Occurrences:   1    Standing Expiration Date:   12/20/2018    Order Specific Question:   Reason for Exam (SYMPTOM  OR DIAGNOSIS REQUIRED)    Answer:   acute sinusitis with facial abscess    Order Specific Question:   Is the patient pregnant?    Answer:   No    Order Specific Question:   Preferred imaging location?    Answer:   External  . POCT CBC  . POCT glucose (manual entry)    Will change her from doxycycline to clindamycin.  Consider giving a ceftriaxone injection.  She pointed out that she had had one 3 months ago and tolerated it well.  However she does have a history of a fairly significant allergic problem from cephalexin so I decided not to risk the ceftriaxone yet, but put on the clindamycin.  However if she gets worse she might need IV ceftriaxone.  Off work for the next 2 days.     Patient Instructions   Based on the x-rays it is my impression this is more of a facial cellulitis and possible abscess than a sinusitis.  If you are getting worse at any time over the next day or two I advise you go to the emergency room to get reassessed.   You may need a scan of the area, and could require hospitalization for treatment with IV antibiotics.  Discontinue the doxycycline  Begin clindamycin 300 mg 1 pill 3 times daily.  If you develop diarrhea/colitis discontinue and get seen probably by your doctor.  I advise you get your doctor to recheck you again in 24 to 48 hours before the weekend.  Take Tylenol 500 mg 2 pills 3 times daily as needed for pain addition to the ibuprofen.  Norco pain pills 1 every 4-6 hours as needed for worse pain  Consider getting your dentist to make certain there is not a deep space problem above your teeth causing you to get these.      If you have lab work done today you will be contacted with your lab results within the next 2 weeks.  If you have not heard from [place] then please contact us. The fastest way to get your results is to register for My Chart.   IF you received an x-ray today, you will receive an invoice from [facility]. Please contact [facility] at [phone] with questions or concerns regarding your invoice.   IF you received labwork today, you will receive an invoice from [place]. Please contact [facility] at [phone] with questions or concerns regarding your invoice.   Our billing staff will not be able to assist you with questions regarding bills from these companies.  You will be contacted with the lab results as soon as they are available. The fastest way to get your results is to activate your My Chart account. Instructions are located on the last page of this paperwork. If you have not heard from [place] regarding the results in 2 weeks, please contact this office.        Return in about 2 years (around 12/21/2019), or if symptoms worsen or fail to improve.   Janace Hoard, MD 12/20/2017

## 2017-12-20 NOTE — Patient Instructions (Addendum)
Based on the x-rays it is my impression this is more of a facial cellulitis and possible abscess than a sinusitis.  If you are getting worse at any time over the next day or two I advise you go to the emergency room to get reassessed.  You may need a scan of the area, and could require hospitalization for treatment with IV antibiotics.  Discontinue the doxycycline  Begin clindamycin 300 mg 1 pill 3 times daily.  If you develop diarrhea/colitis discontinue and get seen probably by your doctor.  I advise you get your doctor to recheck you again in 24 to 48 hours before the weekend.  Take Tylenol 500 mg 2 pills 3 times daily as needed for pain addition to the ibuprofen.  Norco pain pills 1 every 4-6 hours as needed for worse pain  Consider getting your dentist to make certain there is not a deep space problem above your teeth causing you to get these.      If you have lab work done today you will be contacted with your lab results within the next 2 weeks.  If you have not heard from [place] then please contact us. The fastest way to get your results is to register for My Chart.   IF you received an x-ray today, you will receive an invoice from [facility]. Please contact [facility] at [phone] with questions or concerns regarding your invoice.   IF you received labwork today, you will receive an invoice from [facility]. Please contact [facility] at [phone] with questions or concerns regarding your invoice.   Our billing staff will not be able to assist you with questions regarding bills from these companies.  You will be contacted with the lab results as soon as they are available. The fastest way to get your results is to activate your My Chart account. Instructions are located on the last page of this paperwork. If you have not heard from [place] regarding the results in 2 weeks, please contact this office.

## 2017-12-22 ENCOUNTER — Ambulatory Visit: Payer: 59 | Admitting: Gastroenterology

## 2017-12-29 ENCOUNTER — Ambulatory Visit: Payer: 59 | Admitting: Internal Medicine

## 2018-01-05 MED FILL — AJOVY 225 MG/1.5ML SOSY: 225 | 30 days supply | Qty: 2 | Fill #6

## 2018-01-15 MED FILL — medroxyPROGESTERone ACETATE: 150 | 90 days supply | Qty: 1 | Fill #1

## 2018-01-19 MED FILL — LISINOPRIL 5 MG TABLET: 5 | 90 days supply | Qty: 90 | Fill #1

## 2018-01-19 MED FILL — HUMALOG 100 UNITS/ML KWIKPE: 100 | 90 days supply | Qty: 42 | Fill #3

## 2018-01-19 MED FILL — METFORMIN HCL ER 500 MG TAB: 500 | 90 days supply | Qty: 360 | Fill #2

## 2018-01-19 MED FILL — JANUVIA 100 MG TABLET: 100 | 90 days supply | Qty: 90 | Fill #2

## 2018-01-26 ENCOUNTER — Encounter: Payer: Self-pay | Admitting: Internal Medicine

## 2018-01-26 ENCOUNTER — Ambulatory Visit: Payer: 59 | Admitting: Internal Medicine

## 2018-01-26 VITALS — BP 122/60 | HR 96 | Ht 61.0 in | Wt 276.0 lb

## 2018-01-26 DIAGNOSIS — Z794 Long term (current) use of insulin: Secondary | ICD-10-CM | POA: Diagnosis not present

## 2018-01-26 DIAGNOSIS — E785 Hyperlipidemia, unspecified: Secondary | ICD-10-CM

## 2018-01-26 DIAGNOSIS — E1142 Type 2 diabetes mellitus with diabetic polyneuropathy: Secondary | ICD-10-CM

## 2018-01-26 LAB — POCT GLYCOSYLATED HEMOGLOBIN (HGB A1C): Hemoglobin A1C: 6.8 % — AB (ref 4.0–5.6)

## 2018-01-26 MED ORDER — INSULIN GLARGINE 100 UNIT/ML ~~LOC~~ SOLN: 30.0000 [IU] | Freq: Every day | SUBCUTANEOUS | 3 refills | Status: DC

## 2018-01-26 MED ORDER — INSULIN LISPRO 100 UNIT/ML ~~LOC~~ SOLN: SUBCUTANEOUS | 3 refills | Status: DC

## 2018-01-26 NOTE — Patient Instructions (Signed)
Please continue: - Metformin 1000 mg 2x a day - Januvia 100 mg before b'fast  Please decrease: - Lantus to 30 units at bedtime  Continue: - Humalog 5-15 units before each meal  Please always take Humalog 15 min before the meals.  Please return in 4 months with your sugar log.

## 2018-01-26 NOTE — Progress Notes (Signed)
Patient ID: Clay Menser, female   DOB: 06-19-1975, 42 y.o.   MRN: [id number]   HPI: Adrianah Prophete is a 42 y.o.-year-old female, returning for f/u for DM2, dx in 2010, insulin-dependent since 01/2016, uncontrolled, without long term complications. She is the wife of [name], who is also my pt. Last visit 5 mo ago.  She started to change her diet (smaller portions), protein shakes, healthier snacks, started to exercise. She lost 15 lbs since last visit.  She started to develop low CBGs and had to decrease her Humalog doses.  Weight plateau-ed in last mo as she relaxed her diet and was eating out more.  Last hemoglobin A1c was: Lab Results  Component Value Date   HGBA1C 7.9 08/25/2017   HGBA1C 8.5 05/26/2017   HGBA1C 7.2 12/23/2016  02/26/2016: HbA1c 9.2%. Surprisingly, the fructosamine level checked at the same time was 271 (corresponding to the calculated  HbA1c of 6.2%, which, however, does not correlate with the sugars checked at home) 04/07/2015: HbA1c 7.1%  She is occasionally getting steroid inj's.  Pt was on a regimen of: - Metformin ER 1000 mg 2x a day (had N/D/AP with regular metformin). - Januvia 100 mg before b'fast (but has a h/o gall stones pancreatitis in 1995 and had flares afterwards, despite cholecystectomy) - VGo 30 >> 40:  - smaller meal 4 >> 6 clicks - larger meal: 6 >> 8 clicks - snack: 1-2 clicks  [name] >> elevated Cr and Invokana >> yeast infection.  Now on: - Metformin 500 mg 3-4x a day with meals >> 1000 mg 2x a day - Januvia 100 mg before b'fast. - Humalog 15-25  >> 5-8 units before meals - Lantus (20-)40 units at bedtime  Pt checks her sugars 4x a day: - am:  170-210 >> 106-138 >> 72-128, 132 - 2h after b'fast: n/c  - before lunch: 100-151 >> n/c >> 68 - 2h after lunch: n/c >> 123-164 >> n/c >> 118-180 - before dinner: 97, 136-169 >> 180-200 >> n/c - 2h after dinner:170-200s >> 148-201 >> 152-217, 273 - bedtime:  106-196,  218, 225 >> same as above - nighttime: n/c >> 30, 59, 63 Lowest sugar was 106 >> 30 (night). Highest sugar was 201 >> 273.  Glucometer: True Metric >> AccuCheck Guide  Pt's meals are: - Breakfast: instant oatmeal or yoghurt + banana - Lunch: salad + meat  - Dinner: meat + 1-2 veggies, less bread - Snacks: trail mix  -No  CKD, last BUN/creatinine:  Lab Results  Component Value Date   BUN 13 09/23/2017   BUN 10 09/13/2017   CREATININE 0.83 09/23/2017   CREATININE 0.81 09/13/2017  02/26/2016: Glucose 143, BUN/creatinine 22/0.63, EGFR 107 On Lisinopril 5. - + HL; last set of lipids: Lab Results  Component Value Date   CHOL 304 (H) 09/09/2017   HDL 48 09/09/2017   LDLCALC 198 (H) 09/09/2017   TRIG 289 (H) 09/09/2017   CHOLHDL 6.3 (H) 09/09/2017   02/26/2016: 160/179/52/71 On Lipitor (was off this in 08/2017) + Zetia. - last eye exam was 06/2017: No DR - denies numbness but has tingling in her legs.  She was on Lyrica for carpal tunnel in hands and pain in feet from a previous injury.  She had to stop this as she could not get it anymore, but has not noticed any neuropathic symptoms since she stopped. She is now on B12 supplement but did not take it consistently recently.  ROS: Constitutional: +  weight loss, no fatigue, no subjective hyperthermia, no subjective hypothermia Eyes: no blurry vision, no xerophthalmia ENT: no sore throat, no nodules palpated in throat, no dysphagia, no odynophagia, no hoarseness Cardiovascular: no CP/no SOB/no palpitations/no leg swelling Respiratory: no cough/no SOB/no wheezing Gastrointestinal: + N/+ V/+ D/no C/+ acid reflux Musculoskeletal: no muscle aches/no joint aches Skin: no rashes, no hair loss Neurological: no tremors/no numbness/+ tingling - legs/no dizziness  I reviewed pt's medications, allergies, PMH, social hx, family hx, and changes were documented in the history of present illness. Otherwise, unchanged from my initial visit  note.  Past Medical History:  Diagnosis Date  . Diabetes mellitus without complication ([place])   . Hyperlipemia    Past Surgical History:  Procedure Laterality Date  . CHOLECYSTECTOMY    . ENDOMETRIAL ABLATION    . GALLBLADDER SURGERY    . TUBAL LIGATION     Social History   Social History  . Marital status: Married    Spouse name: [name]   . Number of children: 1   Occupational History  . CMA   Social History Main Topics  . Smoking status: Current Every Day Smoker    Packs/day: 1.00    Types: Cigarettes  . Smokeless tobacco: Never Used  . Alcohol use No  . Drug use: No   Current Outpatient Medications on File Prior to Visit  Medication Sig Dispense Refill  . ACCU-CHEK FASTCLIX LANCETS MISC Use to check sugars 2-3 times daily 102 each 11  . AJOVY 225 MG/1.5ML SOSY INJECT '225MG'$  INTO THE SKIN ONCE A MONTH  11  . atorvastatin (LIPITOR) 80 MG tablet Take 1 tablet (80 mg total) by mouth daily. 90 tablet 3  . cetirizine (ZYRTEC) 10 MG tablet Take 10 mg by mouth daily.    . Cholecalciferol (VITAMIN D) 2000 units CAPS Take 1 capsule by mouth daily.    . clindamycin (CLEOCIN) 300 MG capsule Take 1 capsule (300 mg total) by mouth 3 (three) times daily. 21 capsule 0  . Coenzyme Q10 (CO Q 10) 100 MG CAPS Take 1 capsule by mouth daily.    . cyclobenzaprine (FLEXERIL) 10 MG tablet Take 1 tablet (10 mg total) by mouth 3 (three) times daily as needed for muscle spasms. 90 tablet 0  . diclofenac sodium (VOLTAREN) 1 % GEL Apply 2 g topically 4 (four) times daily. 100 g 0  . doxycycline (VIBRA-TABS) 100 MG tablet Take 1 tablet (100 mg total) by mouth 2 (two) times daily. 20 tablet 0  . ezetimibe (ZETIA) 10 MG tablet Take 1 tablet (10 mg total) by mouth daily. 90 tablet 3  . fluticasone (FLONASE) 50 MCG/ACT nasal spray Place 1 spray into both nostrils 2 (two) times daily.    Marland Kitchen glucose blood (ACCU-CHEK GUIDE) test strip Use as instructed 100 each 12  . HYDROcodone-acetaminophen  (NORCO) 5-325 MG tablet Take 1 tablet by mouth every 6 (six) hours as needed. 30 tablet 0  . insulin glargine (LANTUS) 100 UNIT/ML injection Inject 0.4 mLs (40 Units total) into the skin at bedtime. Pens please. 45 mL 3  . insulin lispro (HUMALOG) 100 UNIT/ML injection Inject under skin 20-25 before the 3 meals. Pens please. 60 mL 3  . Insulin Pen Needle 32G X 4 MM MISC Use to inject insulin 4 times daily 400 each 5  . lisinopril (PRINIVIL,ZESTRIL) 5 MG tablet TAKE 1 TABLET BY MOUTH ONCE DAILY 90 tablet 1  . magnesium oxide (MAG-OX) 400 MG tablet Take 400 mg by mouth  daily.    . metFORMIN (GLUCOPHAGE-XR) 500 MG 24 hr tablet Take 2 tablets (1,000 mg total) by mouth 2 (two) times daily with a meal. 360 tablet 3  . methylPREDNISolone (MEDROL DOSEPAK) 4 MG TBPK tablet Take as directed on package (Patient not taking: Reported on 09/23/2017) 21 tablet 0  . metoprolol succinate (TOPROL XL) 25 MG 24 hr tablet Take 1 tablet (25 mg total) by mouth daily. 90 tablet 3  . montelukast (SINGULAIR) 10 MG tablet Take 1 tablet (10 mg total) by mouth at bedtime. 90 tablet 3  . naproxen (NAPROSYN) 500 MG tablet Take 1 tablet (500 mg total) by mouth 2 (two) times daily. 30 tablet 0  . nitrofurantoin, macrocrystal-monohydrate, (MACROBID) 100 MG capsule Take 1 capsule (100 mg total) by mouth 2 (two) times daily. 1 po BId (Patient not taking: Reported on 12/20/2017) 14 capsule 0  . Omega-3 Fatty Acids (FISH OIL) 1000 MG CAPS Take 1 capsule by mouth 2 (two) times daily.    Marland Kitchen oxyCODONE-acetaminophen (PERCOCET/ROXICET) 5-325 MG tablet Take 1-2 tablets by mouth every 4 (four) hours as needed for severe pain. (Patient not taking: Reported on 12/20/2017) 20 tablet 0  . pantoprazole (PROTONIX) 40 MG tablet Take 1 tablet (40 mg total) by mouth 2 (two) times daily before a meal. 180 tablet 1  . sitaGLIPtin (JANUVIA) 100 MG tablet Take 1 tablet (100 mg total) by mouth daily. 90 tablet 3  . SUMAtriptan (IMITREX) 100 MG tablet Take 1 tablet  at earliest onset of migraine.  May repeat once in 2 hours if headache persists or recurs. 10 tablet 2   No current facility-administered medications on file prior to visit.    Allergies  Allergen Reactions  . Gabapentin Other (See Comments)  . Levemir [Insulin Detemir] Dermatitis    Whelps at injection site  . Keflex [Cephalexin] Rash   Family History  Problem Relation Age of Onset  . Cancer Mother   . Diabetes Father   . Liver disease Father   . Heart attack Maternal Grandmother   . Cancer Paternal Grandfather    PE: BP 122/60   Pulse 96   Ht _0  (1.549 m)   Wt 276 lb (125.2 kg)   SpO2 96%   BMI 52.15 kg/m  Wt Readings from Last 3 Encounters:  01/26/18 276 lb (125.2 kg)  12/20/17 276 lb 12.8 oz (125.6 kg)  09/23/17 291 lb 6.4 oz (132.2 kg)   Constitutional: overweight, in NAD Eyes: PERRLA, EOMI, no exophthalmos ENT: moist mucous membranes, no thyromegaly, no cervical lymphadenopathy Cardiovascular: tachycardia, RR, No MRG Respiratory: CTA B Gastrointestinal: abdomen soft, NT, ND, BS+ Musculoskeletal: no deformities, strength intact in all 4 Skin: moist, warm, no rashes Neurological: no tremor with outstretched hands, DTR normal in all 4   ASSESSMENT: 1. DM2, insulin-dependent, uncontrolled, without long term complications, but with hypoglycemia -We cannot use a GLP-1 receptor agonist due to her history of pancreatitis.  2. HL  3.  Obesity   PLAN:  1. Patient with long-standing, uncontrolled, type 2 diabetes, on oral antidiabetic medications and basal-bolus insulin regimen.  She was previously on the VGo patch pump, however, the pump was coming off, so she had to stop.  At last visit, sugars were better, especially in the morning, at or close to goal.  However, they were increasing towards the end of the day and especially after dinner so I advised her to increase her Humalog dose. -However, since then, she improved her diet significantly and her  sugar started  to drop so she decreased her Humalog doses.  She is also occasionally using a 50% lower dose of Lantus when her sugars are lower at bedtime. -Reviewing her sugar log, her sugars are usually at target in the morning and they are either at goal or quite high, in the 200s after dinner.  Upon questioning, patient is eating out more in the last month and is not usually taking the insulin with her.  She comes home after dinner, checks her sugars, and then she injects her insulin.  As a consequence, her sugars after dinner are high and then they plummet during the night to low or very low values.  She had one very low blood sugar 30 and her husband had to give her orange juice.  We discussed about keeping glucose gel or tablets on the nightstand.  She also had 2 instances in which her sugars dropped to 50s or 60s.  These were all developed after she injected her Humalog after dinner.  I advised her to try her best to inject her Humalog before dinner, however, if she gets home and she did not take her insulin before dinner, to not take any corrective insulin to avoid hypoglycemia overnight.  We will also decrease the Lantus to 30 units at bedtime to avoid further hypoglycemia overnight. - I suggested to:  Patient Instructions  Please continue: - Metformin 1000 mg 2x a day - Januvia 100 mg before b'fast  Please decrease: - Lantus to 30 units at bedtime  Continue: - Humalog 5-15 units before each meal  Please always take Humalog 15 min before the meals.  Please return in 4 months with your sugar log.   - today, HbA1c is 6.8% (better) - continue checking sugars at different times of the day - check 3x a day, rotating checks - advised for yearly eye exams >> she is UTD - Return to clinic in 4 mo with sugar log   2. HL - Reviewed latest lipid panel from 08/2017: VERY high LDL, also high TG and T chol Lab Results  Component Value Date   CHOL 304 (H) 09/09/2017   HDL 48 09/09/2017   LDLCALC 198 (H)  09/09/2017   TRIG 289 (H) 09/09/2017   CHOLHDL 6.3 (H) 09/09/2017  - Continues the Lipitor (restarted 08/2017) and Zetia without side effects. - she will have a new lipid panel at next appt with PCP  3. Obesity - excellent improvement in weight after she started to change her diet >> lost 15 lbs - congratulated her but advised her to try to stick with the diet and not eat out as she already started to see higher sugars at night and also she did not lose wt in last mo  Philemon Kingdom, MD PhD [facility] Endocrinology

## 2018-02-02 ENCOUNTER — Ambulatory Visit: Payer: 59 | Admitting: Gastroenterology

## 2018-02-02 ENCOUNTER — Other Ambulatory Visit: Payer: Self-pay | Admitting: Internal Medicine

## 2018-02-02 MED FILL — UNIFINE PENTIPS 32GX5/32": 32G X 4 MM | 90 days supply | Qty: 400 | Fill #0

## 2018-02-02 MED FILL — AJOVY 225 MG/1.5ML SOSY: 225 | 30 days supply | Qty: 2 | Fill #7

## 2018-02-02 MED FILL — UNIFINE PENTIPS 32GX5/32: 32G X 4 MM | 90 days supply | Qty: 400 | Fill #0

## 2018-02-05 DIAGNOSIS — G4733 Obstructive sleep apnea (adult) (pediatric): Secondary | ICD-10-CM | POA: Diagnosis not present

## 2018-02-09 ENCOUNTER — Encounter: Payer: Self-pay | Admitting: Neurology

## 2018-02-09 ENCOUNTER — Encounter: Payer: Self-pay | Admitting: Physician Assistant

## 2018-02-09 ENCOUNTER — Other Ambulatory Visit: Payer: Self-pay

## 2018-02-09 ENCOUNTER — Ambulatory Visit: Payer: 59 | Admitting: Physician Assistant

## 2018-02-09 ENCOUNTER — Ambulatory Visit: Payer: 59 | Admitting: Neurology

## 2018-02-09 VITALS — BP 94/62 | HR 110 | Ht 62.0 in | Wt 278.0 lb

## 2018-02-09 VITALS — BP 126/84 | HR 107 | Temp 98.3°F | Resp 20 | Ht 61.69 in | Wt 278.0 lb

## 2018-02-09 DIAGNOSIS — G43009 Migraine without aura, not intractable, without status migrainosus: Secondary | ICD-10-CM

## 2018-02-09 DIAGNOSIS — G8929 Other chronic pain: Secondary | ICD-10-CM

## 2018-02-09 DIAGNOSIS — G44219 Episodic tension-type headache, not intractable: Secondary | ICD-10-CM | POA: Diagnosis not present

## 2018-02-09 DIAGNOSIS — M545 Low back pain: Secondary | ICD-10-CM

## 2018-02-09 MED ORDER — TRAMADOL HCL 50 MG PO TABS: 50.0000 mg | ORAL_TABLET | Freq: Three times a day (TID) | ORAL | 0 refills | Status: DC | PRN

## 2018-02-09 MED ORDER — TRAMADOL-ACETAMINOPHEN 37.5-325 MG PO TABS: 1.0000 | ORAL_TABLET | Freq: Four times a day (QID) | ORAL | 0 refills | Status: DC | PRN

## 2018-02-09 MED ORDER — TIZANIDINE HCL 2 MG PO CAPS: 2.0000 mg | ORAL_CAPSULE | Freq: Three times a day (TID) | ORAL | 0 refills | Status: DC

## 2018-02-09 MED ORDER — SERTRALINE HCL 25 MG PO TABS: 25.0000 mg | ORAL_TABLET | Freq: Every day | ORAL | 3 refills | Status: DC

## 2018-02-09 MED FILL — traMADol HCL 50 MG TABS: 50 | 5 days supply | Qty: 15 | Fill #0

## 2018-02-09 MED FILL — tiZANidine HCL 2 MG TABS: 2 | 20 days supply | Qty: 60 | Fill #0

## 2018-02-09 MED FILL — SERTRALINE HCL 25 MG TABLET: 25 | 30 days supply | Qty: 30 | Fill #0

## 2018-02-09 NOTE — Progress Notes (Signed)
NEUROLOGY FOLLOW UP OFFICE NOTE  Kelsey Vang [id number]  HISTORY OF PRESENT ILLNESS: Kelsey Vang is a 42 year old female with diabetes and hyperlipidemia who follows up for migraines.  UPDATE: Only 3 migraines since March, 2 hours with treatment  Has had tension headaches 2 to 3 times a week for which she takes Excedrin, lasts 2 hours. Frequency of abortive medication: Excedrin 3 days a week. Current NSAIDS: no Current analgesics: Excedrin Migraine Current triptans: Sumatriptan 100 mg Current ergotamine: None Current anti-emetic: None Current muscle relaxants: Zanflex 2mg  (just prescribed today for back pain) Current anti-anxiolytic: None Current sleep aide: None Current Antihypertensive medications: Metoprolol, lisinopril Current Antidepressant medications: None Current Anticonvulsant medications: None Current anti-CGRP: Ajovy Current Vitamins/Herbal/Supplements: None Current Antihistamines/Decongestants: Flonase, Zyrtec Other therapy: None Hormone/birth control: none  Caffeine:  No coffee.  Will drink caffeine with headache Alcohol: No Smoker: No Diet: Hydrates.  Does not skip meals Exercise: Walks Depression: No; Anxiety: No Other pain: back pain  Sleep hygiene: Poor.  Has OSA and compliant with CPAP  HISTORY:  Onset: Since late teens. Increased frequency for past year. Location:  diffuse Quality:  "lightening bolt" Initial intensity:  Severe..  Sometimes wakes her from sleep.  She denies new headache. Aura:  Occasionally may see flashing lights when her eyes are closed.  Difficult to focus Prodrome:  no Postdrome:  tired Associated symptoms: Nausea, vomiting, photophobia, phonophobia, osmophobia, blurred vision.  No autonomic symptoms.  She denies associated unilateral numbness or weakness. Initial duration:  With treatment, 2 hours severe but less severe for entire day (sometimes 2 days). Initial Frequency:  1 to 2 days a week but has constant dull left  frontal/top headache Initial Frequency of abortive medication: ibuprofen 3 days a week.  Sumatriptan 1 to 2 days a week. Triggers: None Relieving factors:  rest Activity:  Cannot function at all once every few months.  Past NSAIDS:  no Past analgesics:  Tylenol, Fioricet Past abortive triptans:  Maxalt Past muscle relaxants:  Flexeril Past anti-emetic:  no Past antihypertensive medications:  no Past antidepressant medications:   Amitriptyline 100 mg (ineffective, cause palpitations) Past anticonvulsant medications:  Lyrica, topiramate (hallucinations), gabapentin (hallucinations) Past vitamins/Herbal/Supplements:  no Past antihistamines/decongestants:  no Other past therapies:  no  Family history of headache:  mother  PAST MEDICAL HISTORY: Past Medical History:  Diagnosis Date  . Diabetes mellitus without complication (HCC)   . Hyperlipemia     MEDICATIONS: Current Outpatient Medications on File Prior to Visit  Medication Sig Dispense Refill  . ACCU-CHEK FASTCLIX LANCETS MISC Use to check sugars 2-3 times daily 102 each 11  . AJOVY 225 MG/1.5ML SOSY INJECT 225MG  INTO THE SKIN ONCE A MONTH  11  . atorvastatin (LIPITOR) 80 MG tablet Take 1 tablet (80 mg total) by mouth daily. 90 tablet 3  . cetirizine (ZYRTEC) 10 MG tablet Take 10 mg by mouth daily.    . Cholecalciferol (VITAMIN D) 2000 units CAPS Take 1 capsule by mouth daily.    . clindamycin (CLEOCIN) 300 MG capsule Take 1 capsule (300 mg total) by mouth 3 (three) times daily. (Patient not taking: Reported on 01/26/2018) 21 capsule 0  . Coenzyme Q10 (CO Q 10) 100 MG CAPS Take 1 capsule by mouth daily.    . cyclobenzaprine (FLEXERIL) 10 MG tablet Take 1 tablet (10 mg total) by mouth 3 (three) times daily as needed for muscle spasms. 90 tablet 0  . diclofenac sodium (VOLTAREN) 1 % GEL Apply 2 g topically 4 (  four) times daily. 100 g 0  . doxycycline (VIBRA-TABS) 100 MG tablet Take 1 tablet (100 mg total) by mouth 2 (two) times  daily. (Patient not taking: Reported on 01/26/2018) 20 tablet 0  . ezetimibe (ZETIA) 10 MG tablet Take 1 tablet (10 mg total) by mouth daily. 90 tablet 3  . fluticasone (FLONASE) 50 MCG/ACT nasal spray Place 1 spray into both nostrils 2 (two) times daily.    Marland Kitchen glucose blood (ACCU-CHEK GUIDE) test strip Use as instructed 100 each 12  . HYDROcodone-acetaminophen (NORCO) 5-325 MG tablet Take 1 tablet by mouth every 6 (six) hours as needed. (Patient not taking: Reported on 01/26/2018) 30 tablet 0  . insulin glargine (LANTUS) 100 UNIT/ML injection Inject 0.3 mLs (30 Units total) into the skin at bedtime. Pens please. 45 mL 3  . insulin lispro (HUMALOG) 100 UNIT/ML injection Inject under skin 5-15 before the 3 meals. Pens please. 45 mL 3  . lisinopril (PRINIVIL,ZESTRIL) 5 MG tablet TAKE 1 TABLET BY MOUTH ONCE DAILY 90 tablet 1  . magnesium oxide (MAG-OX) 400 MG tablet Take 400 mg by mouth daily.    . metFORMIN (GLUCOPHAGE-XR) 500 MG 24 hr tablet Take 2 tablets (1,000 mg total) by mouth 2 (two) times daily with a meal. 360 tablet 3  . methylPREDNISolone (MEDROL DOSEPAK) 4 MG TBPK tablet Take as directed on package (Patient not taking: Reported on 01/26/2018) 21 tablet 0  . metoprolol succinate (TOPROL XL) 25 MG 24 hr tablet Take 1 tablet (25 mg total) by mouth daily. (Patient not taking: Reported on 01/26/2018) 90 tablet 3  . montelukast (SINGULAIR) 10 MG tablet Take 1 tablet (10 mg total) by mouth at bedtime. (Patient not taking: Reported on 01/26/2018) 90 tablet 3  . naproxen (NAPROSYN) 500 MG tablet Take 1 tablet (500 mg total) by mouth 2 (two) times daily. 30 tablet 0  . nitrofurantoin, macrocrystal-monohydrate, (MACROBID) 100 MG capsule Take 1 capsule (100 mg total) by mouth 2 (two) times daily. 1 po BId (Patient not taking: Reported on 01/26/2018) 14 capsule 0  . Omega-3 Fatty Acids (FISH OIL) 1000 MG CAPS Take 1 capsule by mouth 2 (two) times daily.    Marland Kitchen oxyCODONE-acetaminophen (PERCOCET/ROXICET)  5-325 MG tablet Take 1-2 tablets by mouth every 4 (four) hours as needed for severe pain. (Patient not taking: Reported on 01/26/2018) 20 tablet 0  . pantoprazole (PROTONIX) 40 MG tablet Take 1 tablet (40 mg total) by mouth 2 (two) times daily before a meal. 180 tablet 1  . sitaGLIPtin (JANUVIA) 100 MG tablet Take 1 tablet (100 mg total) by mouth daily. 90 tablet 3  . SUMAtriptan (IMITREX) 100 MG tablet Take 1 tablet at earliest onset of migraine.  May repeat once in 2 hours if headache persists or recurs. 10 tablet 2  . UNIFINE PENTIPS 32G X 4 MM MISC USE TO INJECT INSULIN 4 TIMES DAILY 400 each 5   No current facility-administered medications on file prior to visit.     ALLERGIES: Allergies  Allergen Reactions  . Gabapentin Other (See Comments)  . Levemir [Insulin Detemir] Dermatitis    Whelps at injection site  . Keflex [Cephalexin] Rash    FAMILY HISTORY: Family History  Problem Relation Age of Onset  . Cancer Mother   . Diabetes Father   . Liver disease Father   . Heart attack Maternal Grandmother   . Cancer Paternal Grandfather    SOCIAL HISTORY: Social History   Socioeconomic History  . Marital status: Married  Spouse name: Molli Hazard  . Number of children: 1  . Years of education: Not on file  . Highest education level: Not on file  Occupational History  . Not on file  Social Needs  . Financial resource strain: Not on file  . Food insecurity:    Worry: Not on file    Inability: Not on file  . Transportation needs:    Medical: Not on file    Non-medical: Not on file  Tobacco Use  . Smoking status: Current Every Day Smoker    Packs/day: 0.50    Years: 20.00    Pack years: 10.00    Types: Cigarettes  . Smokeless tobacco: Never Used  Substance and Sexual Activity  . Alcohol use: No  . Drug use: No  . Sexual activity: Yes  Lifestyle  . Physical activity:    Days per week: Not on file    Minutes per session: Not on file  . Stress: Not on file    Relationships  . Social connections:    Talks on phone: Not on file    Gets together: Not on file    Attends religious service: Not on file    Active member of club or organization: Not on file    Attends meetings of clubs or organizations: Not on file    Relationship status: Not on file  . Intimate partner violence:    Fear of current or ex partner: Not on file    Emotionally abused: Not on file    Physically abused: Not on file    Forced sexual activity: Not on file  Other Topics Concern  . Not on file  Social History Narrative  . Not on file    REVIEW OF SYSTEMS: Constitutional: No fevers, chills, or sweats, no generalized fatigue, change in appetite Eyes: No visual changes, double vision, eye pain Ear, nose and throat: No hearing loss, ear pain, nasal congestion, sore throat Cardiovascular: No chest pain, palpitations Respiratory:  No shortness of breath at rest or with exertion, wheezes GastrointestinaI: No nausea, vomiting, diarrhea, abdominal pain, fecal incontinence Genitourinary:  No dysuria, urinary retention or frequency Musculoskeletal:  No neck pain, back pain Integumentary: No rash, pruritus, skin lesions Neurological: as above Psychiatric: No depression, insomnia, anxiety Endocrine: No palpitations, fatigue, diaphoresis, mood swings, change in appetite, change in weight, increased thirst Hematologic/Lymphatic:  No purpura, petechiae. Allergic/Immunologic: no itchy/runny eyes, nasal congestion, recent allergic reactions, rashes  PHYSICAL EXAM: Blood pressure 94/62, pulse (!) 110, height 5\' 2"  (1.575 m), weight 278 lb (126.1 kg), SpO2 95 %. General: No acute distress.  Patient appears well-groomed.   Head:  Normocephalic/atraumatic Eyes:  Fundi examined but not visualized Neck: supple, no paraspinal tenderness, full range of motion Heart:  Regular rate and rhythm Lungs:  Clear to auscultation bilaterally Back: paraspinal tenderness Neurological Exam: alert  and oriented to person, place, and time. Attention span and concentration intact, recent and remote memory intact, fund of knowledge intact.  Speech fluent and not dysarthric, language intact.  CN II-XII intact. Bulk and tone normal, muscle strength 5/5 throughout.  Sensation to light touch, temperature and vibration intact.  Deep tendon reflexes 2+ throughout, toes downgoing.  Finger to nose and heel to shin testing intact.  Gait normal, Romberg negative.  IMPRESSION: 1.  Tension-type headaches, not intractable 2.  Migraine, without aura, without status migrainosus, not intractable  PLAN: 1.  Continue Ajovy for migraine prevention. 2.  For tension type headache prevention, will start sertraline 25mg  daily.  If headaches not improved in 6 weeks, increase dose to 50mg  daily. 3.  If needed, she may try titrating Zanaflex up to 2mg  three times daily in addition to sertraline for preventative treatment of tension-type headache 4.  Continue sumatriptan for migraine and Excedrin for tension headaches.  Limit use of pain relievers to no more than two days out of week to prevent risk of rebound or medication-overuse headache. 5.  Keep headache diary 6.  Follow up in 3 to 4 months  Shon Millet, DO  CC:  Collie Siad, MD

## 2018-02-09 NOTE — Progress Notes (Signed)
Kelsey Vang  MRN: [id number] DOB: Dec 30, 1975  Subjective:  Kelsey Vang is a 42 y.o. female seen in office today for a chief complaint of low back pain. The patient has had recurrent self limited episodes of low back pain in the past since 08/2017. Current symptoms have been present for 2 weeks and are unchanged.  Onset was related to / precipitated by no known injury and turning quickly. The pain is located in the right lumbar area or left lumbar area and does not radiate. The pain is described as aching and stiffness and occurs all day. Symptoms are exacerbated by lying down and certain movements. Symptoms are improved by change in body position, heat and stretching. He has also tried muscle relaxants and NSAIDs which provided no full symptom relief. She denies has weakness in the right leg, weakness in the left leg, tingling in the right leg, tingling in the left leg, burning pain in the right leg, burning pain in the left leg, urinary hesitancy, urinary incontinence, urinary retention, bowel incontinence, constipation, impotence and groin/perineal numbness associated with the back pain. Has been requesting ortho referral for quite sometime.   Review of Systems  Gastrointestinal: Negative for abdominal pain, nausea and vomiting.  Genitourinary: Negative for hematuria.    Patient Active Problem List   Diagnosis Date Noted  . Tachycardia 01/04/2017  . Migraine with aura and without status migrainosus, not intractable 01/04/2017  . Palpitations 01/04/2017  . Obstructive apnea 04/23/2015  . Type 2 diabetes mellitus with diabetic polyneuropathy, with long-term current use of insulin (HCC) 12/27/2013  . Current tobacco use 12/27/2013  . Morbid (severe) obesity due to excess calories (HCC) 12/27/2013  . HLD (hyperlipidemia) 12/27/2013    Current Outpatient Medications on File Prior to Visit  Medication Sig Dispense Refill  . ACCU-CHEK FASTCLIX LANCETS MISC Use to check sugars 2-3  times daily 102 each 11  . AJOVY 225 MG/1.5ML SOSY INJECT 225MG  INTO THE SKIN ONCE A MONTH  11  . atorvastatin (LIPITOR) 80 MG tablet Take 1 tablet (80 mg total) by mouth daily. 90 tablet 3  . cetirizine (ZYRTEC) 10 MG tablet Take 10 mg by mouth daily.    . Cholecalciferol (VITAMIN D) 2000 units CAPS Take 1 capsule by mouth daily.    . Coenzyme Q10 (CO Q 10) 100 MG CAPS Take 1 capsule by mouth daily.    . cyclobenzaprine (FLEXERIL) 10 MG tablet Take 1 tablet (10 mg total) by mouth 3 (three) times daily as needed for muscle spasms. 90 tablet 0  . diclofenac sodium (VOLTAREN) 1 % GEL Apply 2 g topically 4 (four) times daily. 100 g 0  . ezetimibe (ZETIA) 10 MG tablet Take 1 tablet (10 mg total) by mouth daily. 90 tablet 3  . fluticasone (FLONASE) 50 MCG/ACT nasal spray Place 1 spray into both nostrils 2 (two) times daily.    Marland Kitchen glucose blood (ACCU-CHEK GUIDE) test strip Use as instructed 100 each 12  . insulin glargine (LANTUS) 100 UNIT/ML injection Inject 0.3 mLs (30 Units total) into the skin at bedtime. Pens please. 45 mL 3  . insulin lispro (HUMALOG) 100 UNIT/ML injection Inject under skin 5-15 before the 3 meals. Pens please. 45 mL 3  . lisinopril (PRINIVIL,ZESTRIL) 5 MG tablet TAKE 1 TABLET BY MOUTH ONCE DAILY 90 tablet 1  . magnesium oxide (MAG-OX) 400 MG tablet Take 400 mg by mouth daily.    . medroxyPROGESTERone Acetate 150 MG/ML SUSY INJECT 1 ML INTRAMUSCULARLY ONCE EVERY  3 MONTHS.  4  . metFORMIN (GLUCOPHAGE-XR) 500 MG 24 hr tablet Take 2 tablets (1,000 mg total) by mouth 2 (two) times daily with a meal. 360 tablet 3  . naproxen (NAPROSYN) 500 MG tablet Take 1 tablet (500 mg total) by mouth 2 (two) times daily. 30 tablet 0  . Omega-3 Fatty Acids (FISH OIL) 1000 MG CAPS Take 1 capsule by mouth 2 (two) times daily.    . pantoprazole (PROTONIX) 40 MG tablet Take 1 tablet (40 mg total) by mouth 2 (two) times daily before a meal. 180 tablet 1  . sitaGLIPtin (JANUVIA) 100 MG tablet Take 1 tablet  (100 mg total) by mouth daily. 90 tablet 3  . SUMAtriptan (IMITREX) 100 MG tablet Take 1 tablet at earliest onset of migraine.  May repeat once in 2 hours if headache persists or recurs. 10 tablet 2  . UNIFINE PENTIPS 32G X 4 MM MISC USE TO INJECT INSULIN 4 TIMES DAILY 400 each 5  . clindamycin (CLEOCIN) 300 MG capsule Take 1 capsule (300 mg total) by mouth 3 (three) times daily. (Patient not taking: Reported on 01/26/2018) 21 capsule 0  . doxycycline (VIBRA-TABS) 100 MG tablet Take 1 tablet (100 mg total) by mouth 2 (two) times daily. (Patient not taking: Reported on 01/26/2018) 20 tablet 0  . HYDROcodone-acetaminophen (NORCO) 5-325 MG tablet Take 1 tablet by mouth every 6 (six) hours as needed. (Patient not taking: Reported on 01/26/2018) 30 tablet 0  . methylPREDNISolone (MEDROL DOSEPAK) 4 MG TBPK tablet Take as directed on package (Patient not taking: Reported on 01/26/2018) 21 tablet 0  . metoprolol succinate (TOPROL XL) 25 MG 24 hr tablet Take 1 tablet (25 mg total) by mouth daily. (Patient not taking: Reported on 01/26/2018) 90 tablet 3  . montelukast (SINGULAIR) 10 MG tablet Take 1 tablet (10 mg total) by mouth at bedtime. (Patient not taking: Reported on 01/26/2018) 90 tablet 3  . nitrofurantoin, macrocrystal-monohydrate, (MACROBID) 100 MG capsule Take 1 capsule (100 mg total) by mouth 2 (two) times daily. 1 po BId (Patient not taking: Reported on 01/26/2018) 14 capsule 0  . oxyCODONE-acetaminophen (PERCOCET/ROXICET) 5-325 MG tablet Take 1-2 tablets by mouth every 4 (four) hours as needed for severe pain. (Patient not taking: Reported on 01/26/2018) 20 tablet 0   No current facility-administered medications on file prior to visit.     Allergies  Allergen Reactions  . Gabapentin Other (See Comments)  . Levemir [Insulin Detemir] Dermatitis    Whelps at injection site  . Keflex [Cephalexin] Rash     Objective:  BP 126/84   Pulse (!) 107   Temp 98.3 F (36.8 C) (Oral)   Resp 20   Ht  5' 1.69" (1.567 m)   Wt 278 lb (126.1 kg)   LMP 11/01/2017 (Approximate)   SpO2 96%   BMI 51.35 kg/m   Physical Exam  Constitutional: She is oriented to person, place, and time. She appears well-developed and well-nourished. No distress.  Large body habitus noted.   HENT:  Head: Normocephalic and atraumatic.  Eyes: Conjunctivae are normal.  Neck: Normal range of motion.  Pulmonary/Chest: Effort normal.  Musculoskeletal:       Thoracic back: Normal.       Lumbar back: She exhibits tenderness (+reproducible TTP with palpation fo b/l lumbar musculature). She exhibits normal range of motion, no bony tenderness, no swelling, no edema and no spasm.  Neurological: She is alert and oriented to person, place, and time. Gait normal.  Reflex Scores:  Patellar reflexes are 2+ on the right side and 2+ on the left side.      Achilles reflexes are 2+ on the right side and 2+ on the left side. Skin: Skin is warm and dry.  Psychiatric: She has a normal mood and affect.  Vitals reviewed.   MR LUMBAR SPINE 09/13/17. IMPRESSION: 1. No acute osseous abnormality.  Mild levoscoliosis. 2. Mild discogenic degenerative changes and lower lumbar facet hypertrophy greatest at L4-5. 3. L4-5 mild bilateral foraminal and mild canal stenosis. No high-grade foraminal or canal stenosis.  Assessment and Plan :  1. Chronic bilateral low back pain without sciatica Acute on chronic pain of low back. MR in 08/2017 consistent w/ mild levoscoliosis, degenerative changes, and L4-5 mild bilateral foraminal and mild canal stenosis. Not getting relief with naproxen and flexeril. Will d/c flexeril. Start zanaflex. Cont naproxen. Use tramadol for breakthrough pain. Continue stretching and heating pad. Follow up with orhto for further evaluation and management.   - Ambulatory referral to Orthopedic Surgery  Meds ordered this encounter  Medications  . DISCONTD: traMADol-acetaminophen (ULTRACET) 37.5-325 MG tablet     Sig: Take 1 tablet by mouth every 6 (six) hours as needed.    Dispense:  20 tablet    Refill:  0    Order Specific Question:   Supervising Provider    Answer:   Georgina Quint [id number]  . tizanidine (ZANAFLEX) 2 MG capsule    Sig: Take 1 capsule (2 mg total) by mouth 3 (three) times daily.    Dispense:  60 capsule    Refill:  0    Order Specific Question:   Supervising Provider    Answer:   Georgina Quint [id number]  . traMADol (ULTRAM) 50 MG tablet    Sig: Take 1 tablet (50 mg total) by mouth every 8 (eight) hours as needed.    Dispense:  15 tablet    Refill:  0    Order Specific Question:   Supervising Provider    Answer:   Georgina Quint [[id number]]    Side effects, risks, benefits, and alternatives of the medications and treatment plan prescribed today were discussed, and patient expressed understanding of the instructions given. No barriers to understanding were identified. Red flags discussed in detail. Pt expressed understanding regarding what to do in case of emergency/urgent symptoms.    Benjiman Core PA-C  Primary Care at [facility] 02/09/2018 2:03 PM

## 2018-02-09 NOTE — Patient Instructions (Signed)
1.  To help with tension-type headaches, start sertraline 25mg  daily.  If headaches not improved in 6 weeks, contact me and we can increase dose. 2.  Zanaflex is used for tension-type headaches too.  You may try titrating 2mg  up to three times daily (2mg  at bedtime for a week, then 2 mg twice daily for a week, then three times daily).  Caution for drowsiness. 3.  Continue Ajovy 4.  Continue sumatriptan for migraine and Excedrin for tension headaches.  Limit use of pain relievers to no more than two days out of week to prevent risk of rebound or medication-overuse headache. 5.  Keep headache diary 6.  Follow up in 3 to 4 months

## 2018-02-09 NOTE — Patient Instructions (Addendum)
I recommend resting today. However, tomorrow I would begin walking and moving around as much as tolerated. Begin stretching in a couple of days. The worse thing you can do for low back pain is lie in bed all day or sit down all day. Use medications as needed.   Just to know, zanaflex can cause side effects that may impair your thinking or reactions. Be careful if you drive or do anything that requires you to be awake and alert. void drinking alcohol, which can increase some of the side effects of zanaflex. Do not take with flexeril as it is a similar drug. You can start with 1 tablet three times a day and go up to 3 tablets three times a day. Max dose is 36 mg in 24 hours.   Use naproxen as prescribed and tramadol for breakthrough pain.    You should avoid heavy lifting or strenuous repetitive activity to prevent recurrence of event. Experiment with both ice and heat and choose whichever feels best for you.  Use heat pad or ice pack, do not apply directly to skin, use barrier such as towel over the skin. Leave on for 15-20 minutes, 3-4 times a day.  Please perform exercises below. Stretches are to be performed for 2 sets, holding 10-15 seconds each. Recommended to perform this rehab twice daily within pain tolerance for 2 weeks.   FLEXION RANGE OF MOTION AND STRETCHING EXERCISES: STRETCH - Flexion, Single Knee to Chest   Lie on a firm bed or floor with both legs extended in front of you.  Keeping one leg in contact with the floor, bring your opposite knee to your chest. Hold your leg in place by either grabbing behind your thigh or at your knee.  Pull until you feel a gentle stretch in your lower back.   Slowly release your grasp and repeat the exercise with the opposite side.  STRETCH - Flexion, Double Knee to Chest   Lie on a firm bed or floor with both legs extended in front of you.  Keeping one leg in contact with the floor, bring your opposite knee to your chest.  Tense your  stomach muscles to support your back and then lift your other knee to your chest. Hold your legs in place by either grabbing behind your thighs or at your knees.  Pull both knees toward your chest until you feel a gentle stretch in your lower back.   Tense your stomach muscles and slowly return one leg at a time to the floor.  STRETCH - Low Trunk Rotation  Lie on a firm bed or floor. Keeping your legs in front of you, bend your knees so they are both pointed toward the ceiling and your feet are flat on the floor.  Extend your arms out to the side. This will stabilize your upper body by keeping your shoulders in contact with the floor.  Gently and slowly drop both knees together to one side until you feel a gentle stretch in your lower back.   Tense your stomach muscles to support your lower back as you bring your knees back to the starting position. Repeat the exercise to the other side.   EXTENSION RANGE OF MOTION AND FLEXIBILITY EXERCISES: STRETCH - Extension, Prone on Elbows   Lie on your stomach on the floor, a bed will be too soft. Place your palms about shoulder width apart and at the height of your head.  Place your elbows under your shoulders. If this is  too painful, stack pillows under your chest.  Allow your body to relax so that your hips drop lower and make contact more completely with the floor.  Slowly return to lying flat on the floor.  RANGE OF MOTION - Extension, Prone Press Ups  Lie on your stomach on the floor, a bed will be too soft. Place your palms about shoulder width apart and at the height of your head.  Keeping your back as relaxed as possible, slowly straighten your elbows while keeping your hips on the floor. You may adjust the placement of your hands to maximize your comfort. As you gain motion, your hands will come more underneath your shoulders.  Slowly return to lying flat on the floor.  RANGE OF MOTION- Quadruped, Neutral Spine   Assume a hands  and knees position on a firm surface. Keep your hands under your shoulders and your knees under your hips. You may place padding under your knees for comfort.  Drop your head and point your tail bone toward the ground below you. This will round out your lower back like an angry cat.    Slowly lift your head and release your tail bone so that your back sags into a large arch, like an old horse.  Repeat this until you feel limber in your lower back.  Now, find your "sweet spot." This will be the most comfortable position somewhere between the two previous positions. This is your neutral spine. Once you have found this position, tense your stomach muscles to support your lower back.  STRENGTHENING EXERCISES - Low Back Strain These exercises may help you when beginning to rehabilitate your injury. These exercises should be done near your "sweet spot." This is the neutral, low-back arch, somewhere between fully rounded and fully arched, that is your least painful position. When performed in this safe range of motion, these exercises can be used for people who have either a flexion or extension based injury. These exercises may resolve your symptoms with or without further involvement from your physician, physical therapist or athletic trainer. While completing these exercises, remember:   Muscles can gain both the endurance and the strength needed for everyday activities through controlled exercises.  Complete these exercises as instructed by your physician, physical therapist or athletic trainer. Increase the resistance and repetitions only as guided.  You may experience muscle soreness or fatigue, but the pain or discomfort you are trying to eliminate should never worsen during these exercises. If this pain does worsen, stop and make certain you are following the directions exactly. If the pain is still present after adjustments, discontinue the exercise until you can discuss the trouble with your  caregiver.  STRENGTHENING - Deep Abdominals, Pelvic Tilt  Lie on a firm bed or floor. Keeping your legs in front of you, bend your knees so they are both pointed toward the ceiling and your feet are flat on the floor.  Tense your lower abdominal muscles to press your lower back into the floor. This motion will rotate your pelvis so that your tail bone is scooping upwards rather than pointing at your feet or into the floor.  STRENGTHENING - Abdominals, Crunches   Lie on a firm bed or floor. Keeping your legs in front of you, bend your knees so they are both pointed toward the ceiling and your feet are flat on the floor. Cross your arms over your chest.  Slightly tip your chin down without bending your neck.  Tense your abdominals and  slowly lift your trunk high enough to just clear your shoulder blades. Lifting higher can put excessive stress on the lower back and does not further strengthen your abdominal muscles.  Control your return to the starting position.  STRENGTHENING - Quadruped, Opposite UE/LE Lift   Assume a hands and knees position on a firm surface. Keep your hands under your shoulders and your knees under your hips. You may place padding under your knees for comfort.  Find your neutral spine and gently tense your abdominal muscles so that you can maintain this position. Your shoulders and hips should form a rectangle that is parallel with the floor and is not twisted.  Keeping your trunk steady, lift your right hand no higher than your shoulder and then your left leg no higher than your hip. Make sure you are not holding your breath.   Continuing to keep your abdominal muscles tense and your back steady, slowly return to your starting position. Repeat with the opposite arm and leg.  STRENGTHENING - Lower Abdominals, Double Knee Lift  Lie on a firm bed or floor. Keeping your legs in front of you, bend your knees so they are both pointed toward the ceiling and your feet are  flat on the floor.  Tense your abdominal muscles to brace your lower back and slowly lift both of your knees until they come over your hips. Be certain not to hold your breath.  POSTURE AND BODY MECHANICS CONSIDERATIONS - Low Back Strain Keeping correct posture when sitting, standing or completing your activities will reduce the stress put on different body tissues, allowing injured tissues a chance to heal and limiting painful experiences. The following are general guidelines for improved posture. Your physician or physical therapist will provide you with any instructions specific to your needs. While reading these guidelines, remember:  The exercises prescribed by your provider will help you have the flexibility and strength to maintain correct postures.  The correct posture provides the best environment for your joints to work. All of your joints have less wear and tear when properly supported by a spine with good posture. This means you will experience a healthier, less painful body.  Correct posture must be practiced with all of your activities, especially prolonged sitting and standing. Correct posture is as important when doing repetitive low-stress activities (typing) as it is when doing a single heavy-load activity (lifting). RESTING POSITIONS Consider which positions are most painful for you when choosing a resting position. If you have pain with flexion-based activities (sitting, bending, stooping, squatting), choose a position that allows you to rest in a less flexed posture. You would want to avoid curling into a fetal position on your side. If your pain worsens with extension-based activities (prolonged standing, working overhead), avoid resting in an extended position such as sleeping on your stomach. Most people will find more comfort when they rest with their spine in a more neutral position, neither too rounded nor too arched. Lying on a non-sagging bed on your side with a pillow  between your knees, or on your back with a pillow under your knees will often provide some relief. Keep in mind, being in any one position for a prolonged period of time, no matter how correct your posture, can still lead to stiffness. PROPER SITTING POSTURE In order to minimize stress and discomfort on your spine, you must sit with correct posture. Sitting with good posture should be effortless for a healthy body. Returning to good posture is a gradual  process. Many people can work toward this most comfortably by using various supports until they have the flexibility and strength to maintain this posture on their own. When sitting with proper posture, your ears will fall over your shoulders and your shoulders will fall over your hips. You should use the back of the chair to support your upper back. Your lower back will be in a neutral position, just slightly arched. You may place a small pillow or folded towel at the base of your lower back for support.  When working at a desk, create an environment that supports good, upright posture. Without extra support, muscles tire, which leads to excessive strain on joints and other tissues. Keep these recommendations in mind: CHAIR:  A chair should be able to slide under your desk when your back makes contact with the back of the chair. This allows you to work closely.  The chair's height should allow your eyes to be level with the upper part of your monitor and your hands to be slightly lower than your elbows. BODY POSITION  Your feet should make contact with the floor. If this is not possible, use a foot rest.  Keep your ears over your shoulders. This will reduce stress on your neck and lower back. INCORRECT SITTING POSTURES  If you are feeling tired and unable to assume a healthy sitting posture, do not slouch or slump. This puts excessive strain on your back tissues, causing more damage and pain. Healthier options include:  Using more support, like a  lumbar pillow.  Switching tasks to something that requires you to be upright or walking.  Talking a brief walk.  Lying down to rest in a neutral-spine position. PROLONGED STANDING WHILE SLIGHTLY LEANING FORWARD  When completing a task that requires you to lean forward while standing in one place for a long time, place either foot up on a stationary 2-4 inch high object to help maintain the best posture. When both feet are on the ground, the lower back tends to lose its slight inward curve. If this curve flattens (or becomes too large), then the back and your other joints will experience too much stress, tire more quickly, and can cause pain. CORRECT STANDING POSTURES Proper standing posture should be assumed with all daily activities, even if they only take a few moments, like when brushing your teeth. As in sitting, your ears should fall over your shoulders and your shoulders should fall over your hips. You should keep a slight tension in your abdominal muscles to brace your spine. Your tailbone should point down to the ground, not behind your body, resulting in an over-extended swayback posture.  INCORRECT STANDING POSTURES  Common incorrect standing postures include a forward head, locked knees and/or an excessive swayback. WALKING Walk with an upright posture. Your ears, shoulders and hips should all line-up. PROLONGED ACTIVITY IN A FLEXED POSITION When completing a task that requires you to bend forward at your waist or lean over a low surface, try to find a way to stabilize 3 out of 4 of your limbs. You can place a hand or elbow on your thigh or rest a knee on the surface you are reaching across. This will provide you more stability so that your muscles do not fatigue as quickly. By keeping your knees relaxed, or slightly bent, you will also reduce stress across your lower back. CORRECT LIFTING TECHNIQUES DO :   Assume a wide stance. This will provide you more stability and the opportunity  to get as close as possible to the object which you are lifting.  Tense your abdominals to brace your spine. Bend at the knees and hips. Keeping your back locked in a neutral-spine position, lift using your leg muscles. Lift with your legs, keeping your back straight.  Test the weight of unknown objects before attempting to lift them.  Try to keep your elbows locked down at your sides in order get the best strength from your shoulders when carrying an object.  Always ask for help when lifting heavy or awkward objects. INCORRECT LIFTING TECHNIQUES DO NOT:   Lock your knees when lifting, even if it is a small object.  Bend and twist. Pivot at your feet or move your feet when needing to change directions.  Assume that you can safely pick up even a paper clip without proper posture.        IF you received an x-ray today, you will receive an invoice from [facility]. Please contact [facility] at [phone] with questions or concerns regarding your invoice.   IF you received labwork today, you will receive an invoice from [facility]. Please contact [facility] at [phone] with questions or concerns regarding your invoice.   Our billing staff will not be able to assist you with questions regarding bills from these companies.  You will be contacted with the lab results as soon as they are available. The fastest way to get your results is to activate your My Chart account. Instructions are located on the last page of this paperwork. If you have not heard from [place] regarding the results in 2 weeks, please contact this office.

## 2018-02-28 ENCOUNTER — Other Ambulatory Visit: Payer: Self-pay | Admitting: *Deleted

## 2018-02-28 MED ORDER — TIZANIDINE HCL 2 MG PO CAPS: 2.0000 mg | ORAL_CAPSULE | Freq: Three times a day (TID) | ORAL | 1 refills | Status: DC

## 2018-03-02 ENCOUNTER — Ambulatory Visit: Payer: 59 | Admitting: Gastroenterology

## 2018-03-07 ENCOUNTER — Other Ambulatory Visit: Payer: Self-pay | Admitting: Internal Medicine

## 2018-03-07 MED FILL — AJOVY 225 MG/1.5ML SOSY: 225 | 30 days supply | Qty: 2 | Fill #8

## 2018-03-07 MED FILL — SERTRALINE HCL 25 MG TABLET: 25 | 30 days supply | Qty: 30 | Fill #1

## 2018-03-07 MED FILL — METOPROLOL SUCCINATE ER 25: 25 | 90 days supply | Qty: 90 | Fill #3

## 2018-03-07 MED FILL — LANTUS SOLOSTAR 100 UNITS/M: 100 | 44 days supply | Qty: 15 | Fill #0

## 2018-03-07 MED FILL — PANTOPRAZOLE SOD DR 40 MG T: 40 | 30 days supply | Qty: 60 | Fill #0

## 2018-03-07 NOTE — Progress Notes (Signed)
PA initiated via [email or web address] for pt's  Ajovy 225MG/1.5ML syringes  

## 2018-03-08 MED FILL — tiZANidine HCL 2 MG TABS: 2 | 90 days supply | Qty: 270 | Fill #0

## 2018-03-09 NOTE — Progress Notes (Signed)
Received notice that pt's Ajovy has been   Approved 03/07/2018 to 03/07/2019

## 2018-03-12 ENCOUNTER — Ambulatory Visit (INDEPENDENT_AMBULATORY_CARE_PROVIDER_SITE_OTHER): Payer: 59 | Admitting: Orthopaedic Surgery

## 2018-03-12 ENCOUNTER — Encounter (INDEPENDENT_AMBULATORY_CARE_PROVIDER_SITE_OTHER): Payer: Self-pay | Admitting: Orthopaedic Surgery

## 2018-03-12 VITALS — BP 109/80 | HR 87 | Ht 62.0 in | Wt 274.0 lb

## 2018-03-12 DIAGNOSIS — M545 Low back pain, unspecified: Secondary | ICD-10-CM

## 2018-03-12 DIAGNOSIS — G8929 Other chronic pain: Secondary | ICD-10-CM | POA: Diagnosis not present

## 2018-03-12 NOTE — Progress Notes (Signed)
Office Visit Note   Patient: Kelsey Vang           Date of Birth: 1975/11/04           MRN: [id number] Visit Date: 03/12/2018              Requested by: Magdalene River, PA-C [address] [place], [place] [address] PCP: Doristine Bosworth, MD   Assessment & Plan: Visit Diagnoses:  1. Chronic bilateral low back pain, unspecified whether sciatica present     Plan: Chronic low back pain with appropriate meds.  At this point I would suggest a course of physical therapy and consideration of either facet injections or an epidural steroid injection.  Will refer to Dr. Alvester Morin.  Follow-up 1 month  Follow-Up Instructions: Return in about 1 month (around 04/11/2018).   Orders:  Orders Placed This Encounter  Procedures  . Ambulatory referral to Physical Medicine Rehab  . Ambulatory referral to Physical Therapy   No orders of the defined types were placed in this encounter.     Procedures: No procedures performed   Clinical Data: No additional findings.   Subjective: Chief Complaint  Patient presents with  . Lower Back - Pain  Patient presents with lower back pain since May 2019. She denies any known injury. She normally experiences pain and weakness of the left leg. The pain tends to stop at her lower lateral leg and can sometimes be described as burning.  Recently she has had right buttock pain radiating to the right thigh. She can feel spine"pop" at times.  She takes aleve and flexeril as needed. She has used some of an old prescription of Hydrocodone over the past few days. She does have an MRI Lumbar on PACS. 42 year old female has a history of chronic low back pain.  Her pain is predominant low back with some occasional referred discomfort into her left lower extremity.  She notes that for the most part bowel and bladder function are intact.  She has had some numbness and tingling and even burning in both of her lower extremities left more than right.  On occasion she has a  sensation of her back "popping".  In the past she is tried Aleve and hydrocodone as well as stretches and muscle relaxants neurologist for headaches.  She also is diabetic but is never had to see.  She also had an MRI scan of the lumbar spine in May 2000 that I reviewed.  There were some degenerative changes at L4-5 and on the scan she was noted to have mild bilateral foraminal and mild canal stenosis at L4-5. HPI  Review of Systems   Objective: Vital Signs: BP 109/80   Pulse 87   Ht 5\' 2"  (1.575 m)   Wt 274 lb (124.3 kg)   BMI 50.12 kg/m   Physical Exam  Constitutional: She is oriented to person, place, and time. She appears well-developed and well-nourished.  HENT:  Mouth/Throat: Oropharynx is clear and moist.  Eyes: Pupils are equal, round, and reactive to light. EOM are normal.  Pulmonary/Chest: Effort normal.  Neurological: She is alert and oriented to person, place, and time.  Skin: Skin is warm and dry.  Psychiatric: She has a normal mood and affect. Her behavior is normal.    Ortho Exam awake alert and oriented x3.  Comfortable sitting.  BMI is 50.    Painless range of motion of both hips and knees no burning or tingling in either of foot.  Straight leg raise with minimal discomfort in  Back.  Motor exam intact.  Mild percussible tenderness of lumbar spine  Specialty Comments:  No specialty comments available.  Imaging: No results found.   PMFS History: Patient Active Problem List   Diagnosis Date Noted  . Tachycardia 01/04/2017  . Migraine with aura and without status migrainosus, not intractable 01/04/2017  . Palpitations 01/04/2017  . Obstructive apnea 04/23/2015  . Type 2 diabetes mellitus with diabetic polyneuropathy, with long-term current use of insulin (HCC) 12/27/2013  . Current tobacco use 12/27/2013  . Morbid (severe) obesity due to excess calories (HCC) 12/27/2013  . HLD (hyperlipidemia) 12/27/2013   Past Medical History:  Diagnosis Date  . Diabetes  mellitus without complication (HCC)   . Hyperlipemia     Family History  Problem Relation Age of Onset  . Cancer Mother   . Diabetes Father   . Liver disease Father   . Heart attack Maternal Grandmother   . Cancer Paternal Grandfather     Past Surgical History:  Procedure Laterality Date  . CHOLECYSTECTOMY    . ENDOMETRIAL ABLATION    . GALLBLADDER SURGERY    . TUBAL LIGATION     Social History   Occupational History  . Not on file  Tobacco Use  . Smoking status: Current Every Day Smoker    Packs/day: 0.50    Years: 20.00    Pack years: 10.00    Types: Cigarettes  . Smokeless tobacco: Never Used  Substance and Sexual Activity  . Alcohol use: No  . Drug use: No  . Sexual activity: Yes

## 2018-03-30 ENCOUNTER — Other Ambulatory Visit: Payer: Self-pay

## 2018-03-30 MED ORDER — SERTRALINE HCL 50 MG PO TABS: 50.0000 mg | ORAL_TABLET | Freq: Every day | ORAL | 0 refills | Status: DC

## 2018-03-30 MED FILL — SERTRALINE HCL 50 MG TABLET: 50 | 90 days supply | Qty: 90 | Fill #0

## 2018-04-02 ENCOUNTER — Ambulatory Visit (INDEPENDENT_AMBULATORY_CARE_PROVIDER_SITE_OTHER): Payer: Self-pay

## 2018-04-02 ENCOUNTER — Ambulatory Visit (INDEPENDENT_AMBULATORY_CARE_PROVIDER_SITE_OTHER): Payer: 59 | Admitting: Physical Medicine and Rehabilitation

## 2018-04-02 ENCOUNTER — Encounter (INDEPENDENT_AMBULATORY_CARE_PROVIDER_SITE_OTHER): Payer: Self-pay | Admitting: Physical Medicine and Rehabilitation

## 2018-04-02 VITALS — BP 131/85 | HR 98 | Temp 98.3°F

## 2018-04-02 DIAGNOSIS — G8929 Other chronic pain: Secondary | ICD-10-CM | POA: Diagnosis not present

## 2018-04-02 DIAGNOSIS — M545 Low back pain, unspecified: Secondary | ICD-10-CM

## 2018-04-02 DIAGNOSIS — M25552 Pain in left hip: Secondary | ICD-10-CM | POA: Diagnosis not present

## 2018-04-02 DIAGNOSIS — M79605 Pain in left leg: Secondary | ICD-10-CM | POA: Diagnosis not present

## 2018-04-02 DIAGNOSIS — M47816 Spondylosis without myelopathy or radiculopathy, lumbar region: Secondary | ICD-10-CM | POA: Diagnosis not present

## 2018-04-02 MED ORDER — METHYLPREDNISOLONE ACETATE 80 MG/ML IJ SUSP
80.0000 mg | Freq: Once | INTRAMUSCULAR | Status: AC
  Administered 2018-04-02: 80 mg

## 2018-04-02 NOTE — Progress Notes (Signed)
   Numeric Pain Rating Scale and Functional Assessment Average Pain 10   In the last MONTH (on 0-10 scale) has pain interfered with the following?  1. General activity like being  able to carry out your everyday physical activities such as walking, climbing stairs, carrying groceries, or moving a chair?  Rating(8)   +Driver, -BT, -Dye Allergies.  

## 2018-04-02 NOTE — Patient Instructions (Signed)

## 2018-04-03 ENCOUNTER — Other Ambulatory Visit: Payer: Self-pay | Admitting: Family Medicine

## 2018-04-03 ENCOUNTER — Encounter (INDEPENDENT_AMBULATORY_CARE_PROVIDER_SITE_OTHER): Payer: Self-pay | Admitting: Physical Medicine and Rehabilitation

## 2018-04-03 MED FILL — AJOVY 225 MG/1.5ML SOSY: 225 | 30 days supply | Qty: 2 | Fill #9

## 2018-04-03 MED FILL — PANTOPRAZOLE SOD DR 40 MG T: 40 | 90 days supply | Qty: 180 | Fill #0

## 2018-04-03 MED FILL — EZETIMIBE 10 MG TABS: 10 | 90 days supply | Qty: 90 | Fill #2

## 2018-04-03 NOTE — Progress Notes (Signed)
Kelsey Vang - 42 y.o. female MRN [id number]  Date of birth: 04/16/1976  Office Visit Note: Visit Date: 04/02/2018 PCP: Doristine Bosworth, MD Referred by: Doristine Bosworth, MD  Subjective: Chief Complaint  Patient presents with  . Lower Back - Pain  . Right Leg - Pain  . Left Leg - Pain   HPI: Kelsey Vang is a 42 y.o. female who comes in today For evaluation management of her chronic worsening low back pain and some left leg pain.  She has been followed by Dr. Norlene Campbell who has managed and worked up her back and hip and leg pain.  He kindly sent her here for further treatment.  She has middle and lower back pain she has some referral into both hips on the left leg she gets pain down to the ankle but without numbness or tingling.  The pain down the leg is more rare than the low back pain.  She reports no specific injury.  She reports every manner of position or activity exacerbates her pain.  Worse with sitting driving and sitting for a long time.  When I really talk to her though standing and doing things for specifically gives her the most pain.  Nothing so far has helped relieve her symptoms.  She works as a Engineer, site at [employer].  She also is under a lot of stress recently with her mother-in-law and the holiday season.  She rates her average pain as a 10 out of 10 and it is limiting her physical activity and daily activity.  MRI was obtained by Dr. Cleophas Dunker and this shows facet arthropathy at L4-5.  She has not had prior lumbar surgery.  Her case is complicated by morbid obesity as well as type 2 diabetes insulin-dependent and tobacco use.  She denies any focal weakness but has felt weak.  She does use some tramadol without relief.  She is intolerant of gabapentin.  Physical therapy was ordered by Dr. Cleophas Dunker but she is unable to get that started at this point with all of the other issues going on her life.  Review of Systems  Constitutional: Negative for  chills, fever, malaise/fatigue and weight loss.  HENT: Negative for hearing loss and sinus pain.   Eyes: Negative for blurred vision, double vision and photophobia.  Respiratory: Negative for cough and shortness of breath.   Cardiovascular: Negative for chest pain, palpitations and leg swelling.  Gastrointestinal: Negative for abdominal pain, nausea and vomiting.  Genitourinary: Negative for flank pain.  Musculoskeletal: Positive for back pain and joint pain. Negative for myalgias.       Left leg pain  Skin: Negative for itching and rash.  Neurological: Negative for tremors, focal weakness and weakness.  Endo/Heme/Allergies: Negative.   Psychiatric/Behavioral: Negative for depression.  All other systems reviewed and are negative.  Otherwise per HPI.  Assessment & Plan: Visit Diagnoses:  1. Spondylosis without myelopathy or radiculopathy, lumbar region   2. Chronic bilateral low back pain without sciatica   3. Pain in left leg   4. Pain in left hip     Plan: Findings:  Chronic worsening severe axial low back pain with some referral to the left hip more than right as well as left leg.  I think this is more of a facet joint syndrome with referral pattern in the leg.  Could be related to nerve root irritation but there is no focal nerve compression or listhesis or stenosis.  Discussed diagnostic  facet joint block bilaterally and she does want to proceed with this.  If she does not get relief would look at one-time epidural injection.  Otherwise have encouraged her to complete physical therapy at least a short course with home exercise program.  Encouraged weight loss which is hard to do with diabetes and insulin use as these do tend to exacerbate weight gain.  She also is under a lot of stress right now with health problems in the family and I think this contributing to her back pain as well and probably exacerbates the intensity of this.  She might do well with cognitive behavioral therapy.   Injection completed today.    Meds & Orders:  Meds ordered this encounter  Medications  . methylPREDNISolone acetate (DEPO-MEDROL) injection 80 mg    Orders Placed This Encounter  Procedures  . Facet Injection  . XR C-ARM NO REPORT    Follow-up: No follow-ups on file.   Procedures: No procedures performed  Lumbar Facet Joint Intra-Articular Injection(s) with Fluoroscopic Guidance  Patient: Kelsey Vang      Date of Birth: 01-20-1976 MRN: [id number] PCP: Doristine Bosworth, MD      Visit Date: 04/02/2018   Universal Protocol:    Date/Time: 04/02/2018  Consent Given By: the patient  Position: PRONE   Additional Comments: Vital signs were monitored before and after the procedure. Patient was prepped and draped in the usual sterile fashion. The correct patient, procedure, and site was verified.   Injection Procedure Details:  Procedure Site One Meds Administered:  Meds ordered this encounter  Medications  . methylPREDNISolone acetate (DEPO-MEDROL) injection 80 mg     Laterality: Bilateral  Location/Site:  L4-L5  Needle size: 22 guage  Needle type: Spinal  Needle Placement: Articular  Findings:  -Comments: Excellent flow of contrast producing a partial arthrogram.  Procedure Details: The fluoroscope beam is vertically oriented in AP, and the inferior recess is visualized beneath the lower pole of the inferior apophyseal process, which represents the target point for needle insertion. When direct visualization is difficult the target point is located at the medial projection of the vertebral pedicle. The region overlying each aforementioned target is locally anesthetized with a 1 to 2 ml. volume of 1% Lidocaine without Epinephrine.   The spinal needle was inserted into each of the above mentioned facet joints using biplanar fluoroscopic guidance. A 0.25 to 0.5 ml. volume of Isovue-250 was injected and a partial facet joint arthrogram was obtained. A single  spot film was obtained of the resulting arthrogram.    One to 1.25 ml of the steroid/anesthetic solution was then injected into each of the facet joints noted above.   Additional Comments:  The patient tolerated the procedure well Dressing: Band-Aid    Post-procedure details: Patient was observed during the procedure. Post-procedure instructions were reviewed.  Patient left the clinic in stable condition.    Clinical History: MRI LUMBAR SPINE WITHOUT CONTRAST  TECHNIQUE: Multiplanar, multisequence MR imaging of the lumbar spine was performed. No intravenous contrast was administered.  COMPARISON:  11/10/2016 lumbar spine radiographs.  FINDINGS: Segmentation:  Standard.  Alignment: Mild levocurvature with apex at L4. Normal lumbar lordosis without listhesis.  Vertebrae:  No fracture, evidence of discitis, or bone lesion.  Conus medullaris and cauda equina: Conus extends to the L1-2 level. Conus and cauda equina appear normal.  Paraspinal and other soft tissues: Negative.  Disc levels:  L1-2:  Mild disc bulge.  No significant foraminal or canal stenosis.  L2-3: Minimal disc bulge. No significant foraminal or canal stenosis.  L3-4: Minimal disc bulge. No significant foraminal or canal stenosis.  L4-5: Mild disc bulge with moderate left and mild right facet hypertrophy. Mild bilateral foraminal stenosis and canal stenosis.  L5-S1: No significant disc displacement. Mild facet hypertrophy. No foraminal or canal stenosis.  IMPRESSION: 1. No acute osseous abnormality.  Mild levoscoliosis. 2. Mild discogenic degenerative changes and lower lumbar facet hypertrophy greatest at L4-5. 3. L4-5 mild bilateral foraminal and mild canal stenosis. No high-grade foraminal or canal stenosis.   Electronically Signed   By: Mitzi Hansen M.D.   On: 09/14/2017 00:38   She reports that she has been smoking cigarettes. She has a 10.00 pack-year smoking  history. She has never used smokeless tobacco.  Recent Labs    05/26/17 1004 08/25/17 1539 01/26/18 1544  HGBA1C 8.5 7.9 6.8*    Objective:  VS:  HT:    WT:   BMI:     BP:131/85  HR:98bpm  TEMP:98.3 F (36.8 C)(Oral)  RESP:  Physical Exam Vitals signs and nursing note reviewed.  Constitutional:      General: She is not in acute distress.    Appearance: Normal appearance. She is well-developed. She is not ill-appearing.  HENT:     Head: Normocephalic and atraumatic.  Eyes:     Conjunctiva/sclera: Conjunctivae normal.     Pupils: Pupils are equal, round, and reactive to light.  Cardiovascular:     Rate and Rhythm: Normal rate.     Pulses: Normal pulses.  Pulmonary:     Effort: Pulmonary effort is normal.  Musculoskeletal:     Right lower leg: No edema.     Left lower leg: No edema.     Comments: Patient ambulates without aid.  She is slow to rise from a seated position to full extension.  She has good distal strength.  She does ambulate somewhat with a forward flexed spine.  Very uncomfortable sitting.  Skin:    General: Skin is warm and dry.     Findings: No erythema or rash.  Neurological:     General: No focal deficit present.     Mental Status: She is alert and oriented to person, place, and time.     Sensory: No sensory deficit.     Motor: No abnormal muscle tone.     Coordination: Coordination normal.     Gait: Gait normal.  Psychiatric:        Mood and Affect: Mood normal.        Behavior: Behavior normal.     Ortho Exam Imaging: Xr C-arm No Report  Result Date: 04/02/2018 Please see Notes tab for imaging impression.   Past Medical/Family/Surgical/Social History: Medications & Allergies reviewed per EMR, new medications updated. Patient Active Problem List   Diagnosis Date Noted  . Tachycardia 01/04/2017  . Migraine with aura and without status migrainosus, not intractable 01/04/2017  . Palpitations 01/04/2017  . Obstructive apnea 04/23/2015  .  Type 2 diabetes mellitus with diabetic polyneuropathy, with long-term current use of insulin (HCC) 12/27/2013  . Current tobacco use 12/27/2013  . Morbid (severe) obesity due to excess calories (HCC) 12/27/2013  . HLD (hyperlipidemia) 12/27/2013   Past Medical History:  Diagnosis Date  . Diabetes mellitus without complication (HCC)   . Hyperlipemia    Family History  Problem Relation Age of Onset  . Cancer Mother   . Diabetes Father   . Liver disease Father   . Heart attack  Maternal Grandmother   . Cancer Paternal Grandfather    Past Surgical History:  Procedure Laterality Date  . CHOLECYSTECTOMY    . ENDOMETRIAL ABLATION    . GALLBLADDER SURGERY    . TUBAL LIGATION     Social History   Occupational History  . Not on file  Tobacco Use  . Smoking status: Current Every Day Smoker    Packs/day: 0.50    Years: 20.00    Pack years: 10.00    Types: Cigarettes  . Smokeless tobacco: Never Used  Substance and Sexual Activity  . Alcohol use: No  . Drug use: No  . Sexual activity: Yes

## 2018-04-03 NOTE — Procedures (Signed)
Lumbar Facet Joint Intra-Articular Injection(s) with Fluoroscopic Guidance  Patient: Kelsey Vang      Date of Birth: 20-May-1975 MRN: [id number] PCP: Doristine Bosworth, MD      Visit Date: 04/02/2018   Universal Protocol:    Date/Time: 04/02/2018  Consent Given By: the patient  Position: PRONE   Additional Comments: Vital signs were monitored before and after the procedure. Patient was prepped and draped in the usual sterile fashion. The correct patient, procedure, and site was verified.   Injection Procedure Details:  Procedure Site One Meds Administered:  Meds ordered this encounter  Medications  . methylPREDNISolone acetate (DEPO-MEDROL) injection 80 mg     Laterality: Bilateral  Location/Site:  L4-L5  Needle size: 22 guage  Needle type: Spinal  Needle Placement: Articular  Findings:  -Comments: Excellent flow of contrast producing a partial arthrogram.  Procedure Details: The fluoroscope beam is vertically oriented in AP, and the inferior recess is visualized beneath the lower pole of the inferior apophyseal process, which represents the target point for needle insertion. When direct visualization is difficult the target point is located at the medial projection of the vertebral pedicle. The region overlying each aforementioned target is locally anesthetized with a 1 to 2 ml. volume of 1% Lidocaine without Epinephrine.   The spinal needle was inserted into each of the above mentioned facet joints using biplanar fluoroscopic guidance. A 0.25 to 0.5 ml. volume of Isovue-250 was injected and a partial facet joint arthrogram was obtained. A single spot film was obtained of the resulting arthrogram.    One to 1.25 ml of the steroid/anesthetic solution was then injected into each of the facet joints noted above.   Additional Comments:  The patient tolerated the procedure well Dressing: Band-Aid    Post-procedure details: Patient was observed during the  procedure. Post-procedure instructions were reviewed.  Patient left the clinic in stable condition.

## 2018-04-09 ENCOUNTER — Other Ambulatory Visit: Payer: Self-pay | Admitting: Internal Medicine

## 2018-04-09 MED FILL — medroxyPROGESTERone ACETATE: 150 | 90 days supply | Qty: 1 | Fill #2

## 2018-04-09 MED FILL — HUMALOG 100 UNITS/ML KWIKPE: 100 | 90 days supply | Qty: 45 | Fill #0

## 2018-04-12 MED FILL — LANTUS SOLOSTAR 100 UNITS/M: 100 | 44 days supply | Qty: 15 | Fill #1

## 2018-06-01 ENCOUNTER — Ambulatory Visit: Payer: 59 | Admitting: Internal Medicine

## 2018-06-06 ENCOUNTER — Other Ambulatory Visit: Payer: Self-pay

## 2018-06-06 MED ORDER — METOPROLOL SUCCINATE ER 25 MG PO TB24: 25.0000 mg | ORAL_TABLET | Freq: Every day | ORAL | 0 refills | Status: DC

## 2018-06-13 ENCOUNTER — Other Ambulatory Visit: Payer: Self-pay | Admitting: Family Medicine

## 2018-06-18 ENCOUNTER — Other Ambulatory Visit: Payer: Self-pay | Admitting: Family Medicine

## 2018-06-18 ENCOUNTER — Other Ambulatory Visit: Payer: Self-pay

## 2018-06-18 MED ORDER — SERTRALINE HCL 50 MG PO TABS: 50.0000 mg | ORAL_TABLET | Freq: Every day | ORAL | 0 refills | Status: DC

## 2018-06-18 MED ORDER — LISINOPRIL 5 MG PO TABS: 5.0000 mg | ORAL_TABLET | Freq: Every day | ORAL | 0 refills | Status: DC

## 2018-06-21 NOTE — Progress Notes (Deleted)
NEUROLOGY FOLLOW UP OFFICE NOTE  Kelsey Vang [id number]  HISTORY OF PRESENT ILLNESS: Kelsey Vang is a 43 year old woman with diabetes and hyperlipidemia who follows up for migraines and tension type headaches.  UPDATE: In October she was started on sertraline to treat tension type headaches. Intensity:  *** Duration:  *** Frequency:  *** Rescue protocol:  Sumatriptan for migraine, Excedrin for tension-type headache. Current NSAIDS: no Current analgesics: Excedrin Migraine Current triptans: Sumatriptan 100 mg Current ergotamine: None Current anti-emetic: None Current muscle relaxants: Zanflex 2mg  Current anti-anxiolytic: None Current sleep aide: None Current Antihypertensive medications: Metoprolol, lisinopril Current Antidepressant medications:  Sertraline*** Current Anticonvulsant medications: None Current anti-CGRP: Ajovy Current Vitamins/Herbal/Supplements: None Current Antihistamines/Decongestants: Flonase, Zyrtec Other therapy: None Hormone/birth control: none  Caffeine:  No coffee.  Will drink caffeine with headache Alcohol: No Smoker: No Diet: Hydrates.  Does not skip meals Exercise: Walks Depression: No; Anxiety: No Other pain: back pain  Sleep hygiene: Poor.  Has OSA and compliant with CPAP  HISTORY:  Onset: Since late teens.Increased frequency for past year. Location:diffuse Quality:"lightening bolt" Initial intensity:Severe..Sometimes wakes her from sleep. Shedenies new headache. Aura:Occasionally may see flashing lights when her eyes are closed. Difficult to focus Prodrome:no Postdrome:tired Associated symptoms: Nausea, vomiting, photophobia, phonophobia, osmophobia, blurred vision. No autonomic symptoms. She denies associated unilateral numbness or weakness. Initial duration:With treatment, 2 hours severe but less severe for entire day (sometimes 2 days). Initial Frequency:1 to 2 days a week but has constant dull left  frontal/top headache Initial Frequency of abortive medication:ibuprofen 3 days a week. Sumatriptan 1 to 2 days a week. Triggers: None Relieving factors: Rest Activity:Cannot function at all once every few months.  Past NSAIDS:no Past analgesics:Tylenol, Fioricet Past abortive triptans: Maxalt Past muscle relaxants:Flexeril Past anti-emetic:no Past antihypertensive medications:no Past antidepressant medications: Amitriptyline 100 mg (ineffective, cause palpitations) Past anticonvulsant medications: Lyrica, topiramate(hallucinations), gabapentin (hallucinations) Past vitamins/Herbal/Supplements:no Past antihistamines/decongestants:no Other past therapies:no  Family history of headache:mother  PAST MEDICAL HISTORY: Past Medical History:  Diagnosis Date  . Diabetes mellitus without complication (HCC)   . Hyperlipemia     MEDICATIONS: Current Outpatient Medications on File Prior to Visit  Medication Sig Dispense Refill  . ACCU-CHEK FASTCLIX LANCETS MISC Use to check sugars 2-3 times daily 102 each 11  . AJOVY 225 MG/1.5ML SOSY INJECT 225MG  INTO THE SKIN ONCE A MONTH  11  . atorvastatin (LIPITOR) 80 MG tablet Take 1 tablet (80 mg total) by mouth daily. 90 tablet 3  . cetirizine (ZYRTEC) 10 MG tablet Take 10 mg by mouth daily.    . Cholecalciferol (VITAMIN D) 2000 units CAPS Take 1 capsule by mouth daily.    . Coenzyme Q10 (CO Q 10) 100 MG CAPS Take 1 capsule by mouth daily.    . cyclobenzaprine (FLEXERIL) 10 MG tablet Take 1 tablet (10 mg total) by mouth 3 (three) times daily as needed for muscle spasms. 90 tablet 0  . diclofenac sodium (VOLTAREN) 1 % GEL Apply 2 g topically 4 (four) times daily. 100 g 0  . ezetimibe (ZETIA) 10 MG tablet Take 1 tablet (10 mg total) by mouth daily. 90 tablet 3  . fluticasone (FLONASE) 50 MCG/ACT nasal spray Place 1 spray into both nostrils 2 (two) times daily.    Marland Kitchen glucose blood (ACCU-CHEK GUIDE) test strip Use as  instructed 100 each 12  . insulin glargine (LANTUS) 100 UNIT/ML injection Inject 0.3 mLs (30 Units total) into the skin at bedtime. Pens please. 45 mL 3  . insulin lispro (  HUMALOG KWIKPEN) 100 UNIT/ML KwikPen INJECT 12 TO 15 UNITS INTO THE SKIN FOR A MEAL AS DIRECTED 45 mL 3  . insulin lispro (HUMALOG) 100 UNIT/ML injection Inject under skin 5-15 before the 3 meals. Pens please. 45 mL 3  . LANTUS SOLOSTAR 100 UNIT/ML Solostar Pen INECT 34 UNITS INTO THE SKIN AT BEDTIME 45 mL 3  . lisinopril (PRINIVIL,ZESTRIL) 5 MG tablet Take 1 tablet (5 mg total) by mouth daily. 30 tablet 0  . magnesium oxide (MAG-OX) 400 MG tablet Take 400 mg by mouth daily.    . medroxyPROGESTERone Acetate 150 MG/ML SUSY INJECT 1 ML INTRAMUSCULARLY ONCE EVERY 3 MONTHS.  4  . metFORMIN (GLUCOPHAGE-XR) 500 MG 24 hr tablet Take 2 tablets (1,000 mg total) by mouth 2 (two) times daily with a meal. 360 tablet 3  . metoprolol succinate (TOPROL XL) 25 MG 24 hr tablet Take 1 tablet (25 mg total) by mouth daily. 90 tablet 0  . naproxen (NAPROSYN) 500 MG tablet Take 1 tablet (500 mg total) by mouth 2 (two) times daily. 30 tablet 0  . Omega-3 Fatty Acids (FISH OIL) 1000 MG CAPS Take 1 capsule by mouth 2 (two) times daily.    . pantoprazole (PROTONIX) 40 MG tablet TAKE 1 TABLET BY MOUTH TWICE A DAY 180 tablet 1  . sertraline (ZOLOFT) 25 MG tablet Take 1 tablet (25 mg total) by mouth daily. 30 tablet 3  . sertraline (ZOLOFT) 50 MG tablet Take 1 tablet (50 mg total) by mouth daily. 90 tablet 0  . sitaGLIPtin (JANUVIA) 100 MG tablet Take 1 tablet (100 mg total) by mouth daily. 90 tablet 3  . SUMAtriptan (IMITREX) 100 MG tablet Take 1 tablet at earliest onset of migraine.  May repeat once in 2 hours if headache persists or recurs. 10 tablet 2  . tizanidine (ZANAFLEX) 2 MG capsule Take 1 capsule (2 mg total) by mouth 3 (three) times daily. 270 capsule 1  . traMADol (ULTRAM) 50 MG tablet Take 1 tablet (50 mg total) by mouth every 8 (eight) hours  as needed. (Patient not taking: Reported on 03/12/2018) 15 tablet 0  . UNIFINE PENTIPS 32G X 4 MM MISC USE TO INJECT INSULIN 4 TIMES DAILY 400 each 5   No current facility-administered medications on file prior to visit.     ALLERGIES: Allergies  Allergen Reactions  . Gabapentin Other (See Comments)  . Levemir [Insulin Detemir] Dermatitis    Whelps at injection site  . Keflex [Cephalexin] Rash    FAMILY HISTORY: Family History  Problem Relation Age of Onset  . Cancer Mother   . Diabetes Father   . Liver disease Father   . Heart attack Maternal Grandmother   . Cancer Paternal Grandfather    SOCIAL HISTORY: Social History   Socioeconomic History  . Marital status: Married    Spouse name: Molli Hazard  . Number of children: 1  . Years of education: Not on file  . Highest education level: Not on file  Occupational History  . Not on file  Social Needs  . Financial resource strain: Not on file  . Food insecurity:    Worry: Not on file    Inability: Not on file  . Transportation needs:    Medical: Not on file    Non-medical: Not on file  Tobacco Use  . Smoking status: Current Every Day Smoker    Packs/day: 0.50    Years: 20.00    Pack years: 10.00    Types: Cigarettes  .  Smokeless tobacco: Never Used  Substance and Sexual Activity  . Alcohol use: No  . Drug use: No  . Sexual activity: Yes  Lifestyle  . Physical activity:    Days per week: Not on file    Minutes per session: Not on file  . Stress: Not on file  Relationships  . Social connections:    Talks on phone: Not on file    Gets together: Not on file    Attends religious service: Not on file    Active member of club or organization: Not on file    Attends meetings of clubs or organizations: Not on file    Relationship status: Not on file  . Intimate partner violence:    Fear of current or ex partner: Not on file    Emotionally abused: Not on file    Physically abused: Not on file    Forced sexual  activity: Not on file  Other Topics Concern  . Not on file  Social History Narrative  . Not on file    REVIEW OF SYSTEMS: Constitutional: No fevers, chills, or sweats, no generalized fatigue, change in appetite Eyes: No visual changes, double vision, eye pain Ear, nose and throat: No hearing loss, ear pain, nasal congestion, sore throat Cardiovascular: No chest pain, palpitations Respiratory:  No shortness of breath at rest or with exertion, wheezes GastrointestinaI: No nausea, vomiting, diarrhea, abdominal pain, fecal incontinence Genitourinary:  No dysuria, urinary retention or frequency Musculoskeletal:  No neck pain, back pain Integumentary: No rash, pruritus, skin lesions Neurological: as above Psychiatric: No depression, insomnia, anxiety Endocrine: No palpitations, fatigue, diaphoresis, mood swings, change in appetite, change in weight, increased thirst Hematologic/Lymphatic:  No purpura, petechiae. Allergic/Immunologic: no itchy/runny eyes, nasal congestion, recent allergic reactions, rashes  PHYSICAL EXAM: *** General: No acute distress.  Patient appears ***-groomed.  *** body habitus. Head:  Normocephalic/atraumatic Eyes:  Fundi examined but not visualized Neck: supple, no paraspinal tenderness, full range of motion Heart:  Regular rate and rhythm Lungs:  Clear to auscultation bilaterally Back: No paraspinal tenderness Neurological Exam: alert and oriented to person, place, and time. Attention span and concentration intact, recent and remote memory intact, fund of knowledge intact.  Speech fluent and not dysarthric, language intact.  CN II-XII intact. Bulk and tone normal, muscle strength 5/5 throughout.  Sensation to light touch, temperature and vibration intact.  Deep tendon reflexes 2+ throughout, toes downgoing.  Finger to nose and heel to shin testing intact.  Gait normal, Romberg negative.  IMPRESSION: 1.  Tension type headaches, not intractable 2.  Migraine  without aura, without status migrainosus, not intractable  PLAN: 1. For migraine prevention, Ajovy 2. For tension-type headache prevention, *** 3.  For migraine abortive therapy, sumatriptan 4. For tension-type headache abortive therapy, Excedrin. 5.  Limit use of pain relievers to no more than 2 days out of week to prevent risk of rebound or medication-overuse headache. 6.  Keep headache diary 7.  Exercise, hydration, caffeine cessation, sleep hygiene, monitor for and avoid triggers 8.  Consider:  magnesium citrate 400mg  daily, riboflavin 400mg  daily, and coenzyme Q10 100mg  three times daily 9.  Follow up ***  Shon Millet, DO  CC: Collie Siad, MD

## 2018-06-22 ENCOUNTER — Ambulatory Visit: Payer: 59 | Admitting: Neurology

## 2018-06-22 ENCOUNTER — Other Ambulatory Visit: Payer: Self-pay

## 2018-06-22 ENCOUNTER — Encounter: Payer: Self-pay | Admitting: Neurology

## 2018-06-22 ENCOUNTER — Ambulatory Visit (INDEPENDENT_AMBULATORY_CARE_PROVIDER_SITE_OTHER): Payer: PRIVATE HEALTH INSURANCE | Admitting: Neurology

## 2018-06-22 VITALS — BP 108/74 | HR 87 | Ht 62.0 in | Wt 266.0 lb

## 2018-06-22 DIAGNOSIS — G43709 Chronic migraine without aura, not intractable, without status migrainosus: Secondary | ICD-10-CM

## 2018-06-22 MED ORDER — NORTRIPTYLINE HCL 10 MG PO CAPS: 10.0000 mg | ORAL_CAPSULE | Freq: Every day | ORAL | 3 refills | Status: DC

## 2018-06-22 NOTE — Patient Instructions (Signed)
1.  Start nortriptyline 10mg  at bedtime.  We can increase dose in 4 weeks if needed (contact me) 2.  Sumatriptan as needed.  Limit use of pain relievers to no more than 2 days out of week to prevent risk of rebound or medication-overuse headache. 3.  Keep headache diary 4.  Follow up in 4 months

## 2018-06-22 NOTE — Progress Notes (Signed)
NEUROLOGY FOLLOW UP OFFICE NOTE  Kelsey Vang [id number]  HISTORY OF PRESENT ILLNESS: Kelsey Vang is a 43 year old female with diabetes and hyperlipidemia who follows up for migraines.  UPDATE: She stopped Ajovy because she changed insurance and they don't cover it.  Last dose was in January Intensity:  severe Duration:  1 day Frequency:  2 migraines Still has a daily dull headache Frequency of abortive medication: Excedrin 3 days a week. Current NSAIDS: no Current analgesics: Excedrin Migraine Current triptans: Sumatriptan 100 mg Current ergotamine: None Current anti-emetic: None Current muscle relaxants: Zanflex 2mg  (just prescribed today for back pain) Current anti-anxiolytic: None Current sleep aide: None Current Antihypertensive medications: Metoprolol, lisinopril Current Antidepressant medications: sertraline Current Anticonvulsant medications: None Current anti-CGRP:none Current Vitamins/Herbal/Supplements: None Current Antihistamines/Decongestants: Flonase, Zyrtec Other therapy: None Hormone/birth control: none  Caffeine:  No coffee.  Will drink caffeine with headache Alcohol: No Smoker: No Diet: Hydrates.  Does not skip meals Exercise: Walks Depression: No; Anxiety: No Other pain: back pain  Sleep hygiene: Poor.  Has OSA and compliant with CPAP  HISTORY:  Onset: Since late teens. Increased frequency for past year. Location:  diffuse Quality:  "lightening bolt" Initial intensity:  Severe..  Sometimes wakes her from sleep.  She denies new headache. Aura:  Occasionally may see flashing lights when her eyes are closed.  Difficult to focus Prodrome:  no Postdrome:  tired Associated symptoms: Nausea, vomiting, photophobia, phonophobia, osmophobia, blurred vision.  No autonomic symptoms.  She denies associated unilateral numbness or weakness. Initial duration:  With treatment, 2 hours severe but less severe for entire day (sometimes 2 days). Initial  Frequency:  1 to 2 days a week but has constant dull left frontal/top headache Initial Frequency of abortive medication: ibuprofen 3 days a week.  Sumatriptan 1 to 2 days a week. Triggers: None Relieving factors:  rest Activity:  Cannot function at all once every few months.  Past NSAIDS:  no Past analgesics:  Tylenol, Fioricet Past abortive triptans:  Maxalt Past muscle relaxants:  Flexeril Past anti-emetic:  no Past antihypertensive medications:  no Past antidepressant medications:   Amitriptyline 100 mg (ineffective, cause palpitations) Past anticonvulsant medications:  Lyrica, topiramate (hallucinations), gabapentin (hallucinations) Past anti-CGRP:  Ajovy (insurance issue) Past vitamins/Herbal/Supplements:  no Past antihistamines/decongestants:  no Other past therapies:  no  Family history of headache:  mother  PAST MEDICAL HISTORY: Past Medical History:  Diagnosis Date  . Diabetes mellitus without complication (HCC)   . Hyperlipemia     MEDICATIONS: Current Outpatient Medications on File Prior to Visit  Medication Sig Dispense Refill  . ACCU-CHEK FASTCLIX LANCETS MISC Use to check sugars 2-3 times daily 102 each 11  . AJOVY 225 MG/1.5ML SOSY INJECT 225MG  INTO THE SKIN ONCE A MONTH  11  . atorvastatin (LIPITOR) 80 MG tablet Take 1 tablet (80 mg total) by mouth daily. 90 tablet 3  . cetirizine (ZYRTEC) 10 MG tablet Take 10 mg by mouth daily.    . Cholecalciferol (VITAMIN D) 2000 units CAPS Take 1 capsule by mouth daily.    . Coenzyme Q10 (CO Q 10) 100 MG CAPS Take 1 capsule by mouth daily.    . cyclobenzaprine (FLEXERIL) 10 MG tablet Take 1 tablet (10 mg total) by mouth 3 (three) times daily as needed for muscle spasms. 90 tablet 0  . diclofenac sodium (VOLTAREN) 1 % GEL Apply 2 g topically 4 (four) times daily. 100 g 0  . ezetimibe (ZETIA) 10 MG tablet Take 1 tablet (  10 mg total) by mouth daily. 90 tablet 3  . fluticasone (FLONASE) 50 MCG/ACT nasal spray Place 1 spray  into both nostrils 2 (two) times daily.    Marland Kitchen glucose blood (ACCU-CHEK GUIDE) test strip Use as instructed 100 each 12  . insulin glargine (LANTUS) 100 UNIT/ML injection Inject 0.3 mLs (30 Units total) into the skin at bedtime. Pens please. 45 mL 3  . insulin lispro (HUMALOG KWIKPEN) 100 UNIT/ML KwikPen INJECT 12 TO 15 UNITS INTO THE SKIN FOR A MEAL AS DIRECTED 45 mL 3  . insulin lispro (HUMALOG) 100 UNIT/ML injection Inject under skin 5-15 before the 3 meals. Pens please. 45 mL 3  . LANTUS SOLOSTAR 100 UNIT/ML Solostar Pen INECT 34 UNITS INTO THE SKIN AT BEDTIME 45 mL 3  . lisinopril (PRINIVIL,ZESTRIL) 5 MG tablet Take 1 tablet (5 mg total) by mouth daily. 30 tablet 0  . magnesium oxide (MAG-OX) 400 MG tablet Take 400 mg by mouth daily.    . medroxyPROGESTERone Acetate 150 MG/ML SUSY INJECT 1 ML INTRAMUSCULARLY ONCE EVERY 3 MONTHS.  4  . metFORMIN (GLUCOPHAGE-XR) 500 MG 24 hr tablet Take 2 tablets (1,000 mg total) by mouth 2 (two) times daily with a meal. 360 tablet 3  . metoprolol succinate (TOPROL XL) 25 MG 24 hr tablet Take 1 tablet (25 mg total) by mouth daily. 90 tablet 0  . naproxen (NAPROSYN) 500 MG tablet Take 1 tablet (500 mg total) by mouth 2 (two) times daily. 30 tablet 0  . Omega-3 Fatty Acids (FISH OIL) 1000 MG CAPS Take 1 capsule by mouth 2 (two) times daily.    . pantoprazole (PROTONIX) 40 MG tablet TAKE 1 TABLET BY MOUTH TWICE A DAY 180 tablet 1  . sertraline (ZOLOFT) 25 MG tablet Take 1 tablet (25 mg total) by mouth daily. 30 tablet 3  . sertraline (ZOLOFT) 50 MG tablet Take 1 tablet (50 mg total) by mouth daily. 90 tablet 0  . sitaGLIPtin (JANUVIA) 100 MG tablet Take 1 tablet (100 mg total) by mouth daily. 90 tablet 3  . SUMAtriptan (IMITREX) 100 MG tablet Take 1 tablet at earliest onset of migraine.  May repeat once in 2 hours if headache persists or recurs. 10 tablet 2  . tizanidine (ZANAFLEX) 2 MG capsule Take 1 capsule (2 mg total) by mouth 3 (three) times daily. 270 capsule 1   . traMADol (ULTRAM) 50 MG tablet Take 1 tablet (50 mg total) by mouth every 8 (eight) hours as needed. (Patient not taking: Reported on 03/12/2018) 15 tablet 0  . UNIFINE PENTIPS 32G X 4 MM MISC USE TO INJECT INSULIN 4 TIMES DAILY 400 each 5   No current facility-administered medications on file prior to visit.     ALLERGIES: Allergies  Allergen Reactions  . Gabapentin Other (See Comments)  . Levemir [Insulin Detemir] Dermatitis    Whelps at injection site  . Keflex [Cephalexin] Rash    FAMILY HISTORY: Family History  Problem Relation Age of Onset  . Cancer Mother   . Diabetes Father   . Liver disease Father   . Heart attack Maternal Grandmother   . Cancer Paternal Grandfather    SOCIAL HISTORY: Social History   Socioeconomic History  . Marital status: Married    Spouse name: Molli Hazard  . Number of children: 1  . Years of education: Not on file  . Highest education level: Not on file  Occupational History  . Not on file  Social Needs  . Financial resource strain:  Not on file  . Food insecurity:    Worry: Not on file    Inability: Not on file  . Transportation needs:    Medical: Not on file    Non-medical: Not on file  Tobacco Use  . Smoking status: Current Every Day Smoker    Packs/day: 0.50    Years: 20.00    Pack years: 10.00    Types: Cigarettes  . Smokeless tobacco: Never Used  Substance and Sexual Activity  . Alcohol use: No  . Drug use: No  . Sexual activity: Yes  Lifestyle  . Physical activity:    Days per week: Not on file    Minutes per session: Not on file  . Stress: Not on file  Relationships  . Social connections:    Talks on phone: Not on file    Gets together: Not on file    Attends religious service: Not on file    Active member of club or organization: Not on file    Attends meetings of clubs or organizations: Not on file    Relationship status: Not on file  . Intimate partner violence:    Fear of current or ex partner: Not on file      Emotionally abused: Not on file    Physically abused: Not on file    Forced sexual activity: Not on file  Other Topics Concern  . Not on file  Social History Narrative  . Not on file    REVIEW OF SYSTEMS: Constitutional: No fevers, chills, or sweats, no generalized fatigue, change in appetite Eyes: No visual changes, double vision, eye pain Ear, nose and throat: No hearing loss, ear pain, nasal congestion, sore throat Cardiovascular: No chest pain, palpitations Respiratory:  No shortness of breath at rest or with exertion, wheezes GastrointestinaI: No nausea, vomiting, diarrhea, abdominal pain, fecal incontinence Genitourinary:  No dysuria, urinary retention or frequency Musculoskeletal:  No neck pain, back pain Integumentary: No rash, pruritus, skin lesions Neurological: as above Psychiatric: No depression, insomnia, anxiety Endocrine: No palpitations, fatigue, diaphoresis, mood swings, change in appetite, change in weight, increased thirst Hematologic/Lymphatic:  No purpura, petechiae. Allergic/Immunologic: no itchy/runny eyes, nasal congestion, recent allergic reactions, rashes  PHYSICAL EXAM: Blood pressure 94/62, pulse (!) 110, height 5\' 2"  (1.575 m), weight 278 lb (126.1 kg), SpO2 95 %. General: No acute distress.  Patient appears well-groomed.   Head:  Normocephalic/atraumatic Eyes:  Fundi examined but not visualized Neck: supple, no paraspinal tenderness, full range of motion Heart:  Regular rate and rhythm Lungs:  Clear to auscultation bilaterally Back: paraspinal tenderness Neurological Exam: alert and oriented to person, place, and time. Attention span and concentration intact, recent and remote memory intact, fund of knowledge intact.  Speech fluent and not dysarthric, language intact.  CN II-XII intact. Bulk and tone normal, muscle strength 5/5 throughout.  Sensation to light touch, temperature and vibration intact.  Deep tendon reflexes 2+ throughout, toes  downgoing.  Finger to nose and heel to shin testing intact.  Gait normal, Romberg negative.  IMPRESSION: 1.  Tension-type headaches, not intractable 2.  Migraine, without aura, without status migrainosus, not intractable  PLAN: 1.  Will start nortriptyline 10mg  at bedtime 2.  For tension type headache prevention, will start sertraline 25mg  daily.  If headaches not improved in 6 weeks, increase dose to 50mg  daily. 3.  If needed, she may try titrating Zanaflex up to 2mg  three times daily in addition to sertraline for preventative treatment of tension-type headache 4.  Continue sumatriptan for migraine and Excedrin for tension headaches.  Limit use of pain relievers to no more than two days out of week to prevent risk of rebound or medication-overuse headache. 5.  Keep headache diary 6.  Follow up in 3 to 4 months  Shon Millet, DO  CC:  Collie Siad, MD

## 2018-07-05 ENCOUNTER — Telehealth: Payer: PRIVATE HEALTH INSURANCE | Admitting: Family

## 2018-07-05 DIAGNOSIS — J329 Chronic sinusitis, unspecified: Secondary | ICD-10-CM

## 2018-07-05 DIAGNOSIS — B9689 Other specified bacterial agents as the cause of diseases classified elsewhere: Secondary | ICD-10-CM | POA: Diagnosis not present

## 2018-07-05 MED ORDER — DOXYCYCLINE HYCLATE 100 MG PO TABS: 100.0000 mg | ORAL_TABLET | Freq: Two times a day (BID) | ORAL | 0 refills | Status: DC

## 2018-07-05 NOTE — Progress Notes (Signed)
Greater than 5 minutes, yet less than 10 minutes of time have been spent researching, coordinating, and implementing care for this patient today.  Thank you for the details you included in the comment boxes. Those details are very helpful in determining the best course of treatment for you and help [place] to provide the best care.   Typically, we don't consider antibiotics until day 7. If you are worsening between today and tomorrow, you may use them. We are being slightly more flexible with some guidelines knowing that many people do not feel safe sitting in a waiting room right now with the outbreak.  We are sorry that you are not feeling well.  Here is how we plan to help!  Based on what you have shared with me it looks like you have sinusitis.  Sinusitis is inflammation and infection in the sinus cavities of the head.  Based on your presentation I believe you most likely have Acute Bacterial Sinusitis.  This is an infection caused by bacteria and is treated with antibiotics. I have prescribed Doxycycline 100mg  by mouth twice a day for 10 days. You may use an oral decongestant such as Mucinex D or if you have glaucoma or high blood pressure use plain Mucinex. Saline nasal spray help and can safely be used as often as needed for congestion.  If you develop worsening sinus pain, fever or notice severe headache and vision changes, or if symptoms are not better after completion of antibiotic, please schedule an appointment with a health care provider.    Sinus infections are not as easily transmitted as other respiratory infection, however we still recommend that you avoid close contact with loved ones, especially the very young and elderly.  Remember to wash your hands thoroughly throughout the day as this is the number one way to prevent the spread of infection!  Home Care:  Only take medications as instructed by your medical team.  Complete the entire course of an antibiotic.  Do not take these  medications with alcohol.  A steam or ultrasonic humidifier can help congestion.  You can place a towel over your head and breathe in the steam from hot water coming from a faucet.  Avoid close contacts especially the very young and the elderly.  Cover your mouth when you cough or sneeze.  Always remember to wash your hands.  Get Help Right Away If:  You develop worsening fever or sinus pain.  You develop a severe head ache or visual changes.  Your symptoms persist after you have completed your treatment plan.  Make sure you  Understand these instructions.  Will watch your condition.  Will get help right away if you are not doing well or get worse.  Your e-visit answers were reviewed by a board certified advanced clinical practitioner to complete your personal care plan.  Depending on the condition, your plan could have included both over the counter or prescription medications.  If there is a problem please reply  once you have received a response from your provider.  Your safety is important to [place].  If you have drug allergies check your prescription carefully.    You can use MyChart to ask questions about today's visit, request a non-urgent call back, or ask for a work or school excuse for 24 hours related to this e-Visit. If it has been greater than 24 hours you will need to follow up with your provider, or enter a new e-Visit to address those concerns.  You  will get an e-mail in the next two days asking about your experience.  I hope that your e-visit has been valuable and will speed your recovery. Thank you for using e-visits.

## 2018-07-15 ENCOUNTER — Telehealth: Payer: PRIVATE HEALTH INSURANCE | Admitting: Nurse Practitioner

## 2018-07-15 DIAGNOSIS — J069 Acute upper respiratory infection, unspecified: Secondary | ICD-10-CM

## 2018-07-15 MED ORDER — BENZONATATE 100 MG PO CAPS: 100.0000 mg | ORAL_CAPSULE | Freq: Three times a day (TID) | ORAL | 0 refills | Status: DC | PRN

## 2018-07-15 MED ORDER — FLUTICASONE PROPIONATE 50 MCG/ACT NA SUSP: 2.0000 | Freq: Every day | NASAL | 6 refills | Status: DC

## 2018-07-15 NOTE — Progress Notes (Signed)
We are sorry you are not feeling well.  Here is how we plan to help!  Based on what you have shared with me, it looks like you may have a viral upper respiratory infection.  Upper respiratory infections are caused by a large number of viruses; however, rhinovirus is the most common cause.   Symptoms vary from person to person, with common symptoms including sore throat, cough, and fatigue or lack of energy.  A low-grade fever of up to 100.4 may present, but is often uncommon.  Symptoms vary however, and are closely related to a person's age or underlying illnesses.  The most common symptoms associated with an upper respiratory infection are nasal discharge or congestion, cough, sneezing, headache and pressure in the ears and face.  These symptoms usually persist for about 3 to 10 days, but can last up to 2 weeks.  It is important to know that upper respiratory infections do not cause serious illness or complications in most cases.    Upper respiratory infections can be transmitted from person to person, with the most common method of transmission being a person's hands.  The virus is able to live on the skin and can infect other persons for up to 2 hours after direct contact.  Also, these can be transmitted when someone coughs or sneezes; thus, it is important to cover the mouth to reduce this risk.  To keep the spread of the illness at bay, good hand hygiene is very important.  This is an infection that is most likely caused by a virus. There are no specific treatments other than to help you with the symptoms until the infection runs its course.  We are sorry you are not feeling well.  Here is how we plan to help!   For nasal congestion, you may use an oral decongestants such as Mucinex D or if you have glaucoma or high blood pressure use plain Mucinex.  Saline nasal spray or nasal drops can help and can safely be used as often as needed for congestion.  For your congestion, I have prescribed Fluticasone  nasal spray one spray in each nostril twice a day  If you do not have a history of heart disease, hypertension, diabetes or thyroid disease, prostate/bladder issues or glaucoma, you may also use Sudafed to treat nasal congestion.  It is highly recommended that you consult with a pharmacist or your primary care physician to ensure this medication is safe for you to take.     If you have a cough, you may use cough suppressants such as Delsym and Robitussin.  If you have glaucoma or high blood pressure, you can also use Coricidin HBP.   For cough I have prescribed for you A prescription cough medication called Tessalon Perles 100 mg. You may take 1-2 capsules every 8 hours as needed for cough  If you have a sore or scratchy throat, use a saltwater gargle-  to  teaspoon of salt dissolved in a 4-ounce to 8-ounce glass of warm water.  Gargle the solution for approximately 15-30 seconds and then spit.  It is important not to swallow the solution.  You can also use throat lozenges/cough drops and Chloraseptic spray to help with throat pain or discomfort.  Warm or cold liquids can also be helpful in relieving throat pain.  For headache, pain or general discomfort, you can use Ibuprofen or Tylenol as directed.   Some authorities believe that zinc sprays or the use of Echinacea may shorten the   course of your symptoms.   HOME CARE . Only take medications as instructed by your medical team. . Be sure to drink plenty of fluids. Water is fine as well as fruit juices, sodas and electrolyte beverages. You may want to stay away from caffeine or alcohol. If you are nauseated, try taking small sips of liquids. How do you know if you are getting enough fluid? Your urine should be a pale yellow or almost colorless. . Get rest. . Taking a steamy shower or using a humidifier may help nasal congestion and ease sore throat pain. You can place a towel over your head and breathe in the steam from hot water coming from a  faucet. . Using a saline nasal spray works much the same way. . Cough drops, hard candies and sore throat lozenges may ease your cough. . Avoid close contacts especially the very young and the elderly . Cover your mouth if you cough or sneeze . Always remember to wash your hands.   GET HELP RIGHT AWAY IF: . You develop worsening fever. . If your symptoms do not improve within 10 days . You develop yellow or green discharge from your nose over 3 days. . You have coughing fits . You develop a severe head ache or visual changes. . You develop shortness of breath, difficulty breathing or start having chest pain . Your symptoms persist after you have completed your treatment plan  MAKE SURE YOU   Understand these instructions.  Will watch your condition.  Will get help right away if you are not doing well or get worse.  Your e-visit answers were reviewed by a board certified advanced clinical practitioner to complete your personal care plan. Depending upon the condition, your plan could have included both over the counter or prescription medications. Please review your pharmacy choice. If there is a problem, you may call our nursing hot line at and have the prescription routed to another pharmacy. Your safety is important to us. If you have drug allergies check your prescription carefully.   You can use MyChart to ask questions about today's visit, request a non-urgent call back, or ask for a work or school excuse for 24 hours related to this e-Visit. If it has been greater than 24 hours you will need to follow up with your provider, or enter a new e-Visit to address those concerns. You will get an e-mail in the next two days asking about your experience.  I hope that your e-visit has been valuable and will speed your recovery. Thank you for using e-visits.   5 minutes spent reviewing and documenting in chart.    

## 2018-07-16 ENCOUNTER — Ambulatory Visit: Payer: Self-pay | Admitting: *Deleted

## 2018-07-16 NOTE — Telephone Encounter (Signed)
Spoke with Gearldine Bienenstock at PCP. She will have clinical to view encounter.

## 2018-07-16 NOTE — Telephone Encounter (Signed)
Non stop non productive cough that gets much worse at night for 3-4 days. Heard some wheezing last night none right now. Chest tightness and felt in her back also that worsens with exertion/coughing. Constant headache for 3-4 days that tylenol does not help. Been using mucinex/robitussin.Tried delsym but nothing helping constant cough at this point. Treated for sinus infection via evisit about 2 weeks ago and completed doxycycline 3 days ago but feeling worse. Sinus congestion and pressure has resolved but feeling worse with the cough and headache now. No fever during this time. Told to try nyquil but she cannot take it. She has Proair and Ventolin inhalers that are within date.She is a smoker. Advised against smoking at this time. Other care advice per protocol suggested. FYI-she coughed the entire phone call. No travels, no known positive exposure. Routing to PCP for possible appointment or further advice.  [facility].  Reason for Disposition . [1] Continuous (nonstop) coughing AND [2] keeps from working or sleeping  Answer Assessment - Initial Assessment Questions 1. RESPIRATORY STATUS: "Describe your breathing?" (e.g., wheezing, shortness of breath, unable to speak, severe coughing)      Some wheezing last night. Severe coughing. Winded yesterday while working in the yard. 2. ONSET: "When did this breathing problem begin?"      About 2 weeks ago the sinus pressure started and was treated for a sinus infection. 3. PATTERN "Does the difficult breathing come and go, or has it been constant since it started?"     Deep breathing and coughing spells she gets short of breath 4. SEVERITY: "How bad is your breathing?" (e.g., mild, moderate, severe)    - MILD: No SOB at rest, mild SOB with walking, speaks normally in sentences, can lay down, no retractions, pulse < 100.    - MODERATE: SOB at rest, SOB with minimal exertion and prefers to sit, cannot lie down flat, speaks in phrases, mild  retractions, audible wheezing, pulse 100-120.    - SEVERE: Very SOB at rest, speaks in single words, struggling to breathe, sitting hunched forward, retractions, pulse > 120    Feels short of breath even with sitting. 5. RECURRENT SYMPTOM: "Have you had difficulty breathing before?" If so, ask: "When was the last time?" and "What happened that time?"      none 6. CARDIAC HISTORY: "Do you have any history of heart disease?" (e.g., heart attack, angina, bypass surgery, angioplasty)     Diabetes type 2. Tachycardia and under the care of Dr[facility] 7. LUNG HISTORY: "Do you have any history of lung disease?"  (e.g., pulmonary embolus, asthma, emphysema)    Uses a cpap at night, smoker 8. CAUSE: "What do you think is causing the breathing problem?"      9. OTHER SYMPTOMS: "Do you have any other symptoms? (e.g., dizziness, runny nose, cough, chest pain, fever)     Lightheaded with doing yard work yesterday.  Constant headache  10. PREGNANCY: "Is there any chance you are pregnant?" "When was your last menstrual period?"       no 11. TRAVEL: "Have you traveled out of the country in the last month?" (e.g., travel history, exposures)       No travels, no known exposures.  Protocols used: BREATHING DIFFICULTY-A-AH

## 2018-07-17 ENCOUNTER — Other Ambulatory Visit: Payer: Self-pay

## 2018-07-17 ENCOUNTER — Telehealth (INDEPENDENT_AMBULATORY_CARE_PROVIDER_SITE_OTHER): Payer: Self-pay | Admitting: Family Medicine

## 2018-07-17 DIAGNOSIS — J069 Acute upper respiratory infection, unspecified: Secondary | ICD-10-CM

## 2018-07-17 DIAGNOSIS — N390 Urinary tract infection, site not specified: Secondary | ICD-10-CM

## 2018-07-17 DIAGNOSIS — A084 Viral intestinal infection, unspecified: Secondary | ICD-10-CM

## 2018-07-17 NOTE — Progress Notes (Signed)
CC: coughing, ha's and wheezing x 3-4 days.  See triage note 07/16/2018.  Pt travel to [place] on 06/15/2018 thru 06/17/2018.

## 2018-07-17 NOTE — Telephone Encounter (Signed)
Has an appointment today scheduled with PCP for further evaluation.

## 2018-07-17 NOTE — Progress Notes (Signed)
Telemedicine Encounter- SOAP NOTE Established Patient  This telephone encounter was conducted with the patient's (or proxy's) verbal consent via audio telecommunications: yes/no: Yes Patient was instructed to have this encounter in a suitably private space; and to only have persons present to whom they give permission to participate. In addition, patient identity was confirmed by use of name plus two identifiers (DOB and address).  I discussed the limitations, risks, security and privacy concerns of performing an evaluation and management service by telephone and the availability of in person appointments. I also discussed with the patient that there may be a patient responsible charge related to this service. The patient expressed understanding and agreed to proceed.  I spent a total of TIME; 0 MIN TO 60 MIN: 20 minutes talking with the patient or their proxy.  No chief complaint on file.   Subjective   Kelsey Vang is a 43 y.o. established patient. Telephone visit today for  HPI  Patient reports that she has coughing, headaches, wheezing for 4 days The wheezing is only at night Nausea, vomiting and diarrhea for one day She states that she was out shopping over the weekend on 07/07/2018 and some of the employees at the store were positive for coronavirus She denies fevers  She has stayed home since the symptoms started. She states that her husband travels for work and was in [place], [place], [place] and [place] She reports that her husband has a cough that is believed to be related to allergies.  She was treated with doxycycline but that did not help She reports that after finishing the doxycycline and started feeling chest pressure and  She has been using tessalon perles and the albuterol inhaler She was using a humidifier overnight    Patient Active Problem List   Diagnosis Date Noted  . Tachycardia 01/04/2017  . Migraine with aura and without status migrainosus, not  intractable 01/04/2017  . Palpitations 01/04/2017  . Obstructive apnea 04/23/2015  . Type 2 diabetes mellitus with diabetic polyneuropathy, with long-term current use of insulin (HCC) 12/27/2013  . Current tobacco use 12/27/2013  . Morbid (severe) obesity due to excess calories (HCC) 12/27/2013  . HLD (hyperlipidemia) 12/27/2013    Past Medical History:  Diagnosis Date  . Diabetes mellitus without complication (HCC)   . Hyperlipemia     Current Outpatient Medications  Medication Sig Dispense Refill  . ACCU-CHEK FASTCLIX LANCETS MISC Use to check sugars 2-3 times daily 102 each 11  . benzonatate (TESSALON PERLES) 100 MG capsule Take 1 capsule (100 mg total) by mouth 3 (three) times daily as needed. 20 capsule 0  . cetirizine (ZYRTEC) 10 MG tablet Take 10 mg by mouth daily.    . Cholecalciferol (VITAMIN D) 2000 units CAPS Take 1 capsule by mouth daily.    . Coenzyme Q10 (CO Q 10) 100 MG CAPS Take 1 capsule by mouth daily.    . diclofenac sodium (VOLTAREN) 1 % GEL Apply 2 g topically 4 (four) times daily. 100 g 0  . ezetimibe (ZETIA) 10 MG tablet Take 1 tablet (10 mg total) by mouth daily. 90 tablet 3  . fluticasone (FLONASE) 50 MCG/ACT nasal spray Place 2 sprays into both nostrils daily. 16 g 6  . glucose blood (ACCU-CHEK GUIDE) test strip Use as instructed 100 each 12  . insulin glargine (LANTUS) 100 UNIT/ML injection Inject 0.3 mLs (30 Units total) into the skin at bedtime. Pens please. 45 mL 3  . insulin lispro (HUMALOG KWIKPEN) 100  UNIT/ML KwikPen INJECT 12 TO 15 UNITS INTO THE SKIN FOR A MEAL AS DIRECTED 45 mL 3  . insulin lispro (HUMALOG) 100 UNIT/ML injection Inject under skin 5-15 before the 3 meals. Pens please. 45 mL 3  . LANTUS SOLOSTAR 100 UNIT/ML Solostar Pen INECT 34 UNITS INTO THE SKIN AT BEDTIME 45 mL 3  . lisinopril (PRINIVIL,ZESTRIL) 5 MG tablet Take 1 tablet (5 mg total) by mouth daily. 30 tablet 0  . magnesium oxide (MAG-OX) 400 MG tablet Take 400 mg by mouth daily.     . medroxyPROGESTERone Acetate 150 MG/ML SUSY INJECT 1 ML INTRAMUSCULARLY ONCE EVERY 3 MONTHS.  4  . metFORMIN (GLUCOPHAGE-XR) 500 MG 24 hr tablet Take 2 tablets (1,000 mg total) by mouth 2 (two) times daily with a meal. 360 tablet 3  . metoprolol succinate (TOPROL XL) 25 MG 24 hr tablet Take 1 tablet (25 mg total) by mouth daily. 90 tablet 0  . nortriptyline (PAMELOR) 10 MG capsule Take 1 capsule (10 mg total) by mouth at bedtime. 30 capsule 3  . Omega-3 Fatty Acids (FISH OIL) 1000 MG CAPS Take 1 capsule by mouth 2 (two) times daily.    . pantoprazole (PROTONIX) 40 MG tablet TAKE 1 TABLET BY MOUTH TWICE A DAY 180 tablet 1  . sertraline (ZOLOFT) 50 MG tablet Take 1 tablet (50 mg total) by mouth daily. 90 tablet 0  . SUMAtriptan (IMITREX) 100 MG tablet Take 1 tablet at earliest onset of migraine.  May repeat once in 2 hours if headache persists or recurs. 10 tablet 2  . tizanidine (ZANAFLEX) 2 MG capsule Take 1 capsule (2 mg total) by mouth 3 (three) times daily. 270 capsule 1  . traMADol (ULTRAM) 50 MG tablet Take 1 tablet (50 mg total) by mouth every 8 (eight) hours as needed. 15 tablet 0  . UNIFINE PENTIPS 32G X 4 MM MISC USE TO INJECT INSULIN 4 TIMES DAILY 400 each 5  . AJOVY 225 MG/1.5ML SOSY INJECT 225MG  INTO THE SKIN ONCE A MONTH  11  . atorvastatin (LIPITOR) 80 MG tablet Take 1 tablet (80 mg total) by mouth daily. (Patient not taking: Reported on 07/17/2018) 90 tablet 3  . cyclobenzaprine (FLEXERIL) 10 MG tablet Take 1 tablet (10 mg total) by mouth 3 (three) times daily as needed for muscle spasms. (Patient not taking: Reported on 07/17/2018) 90 tablet 0  . doxycycline (VIBRA-TABS) 100 MG tablet Take 1 tablet (100 mg total) by mouth 2 (two) times daily. (Patient not taking: Reported on 07/17/2018) 20 tablet 0  . naproxen (NAPROSYN) 500 MG tablet Take 1 tablet (500 mg total) by mouth 2 (two) times daily. (Patient not taking: Reported on 07/17/2018) 30 tablet 0  . sertraline (ZOLOFT) 25 MG tablet  Take 1 tablet (25 mg total) by mouth daily. (Patient not taking: Reported on 07/17/2018) 30 tablet 3  . sitaGLIPtin (JANUVIA) 100 MG tablet Take 1 tablet (100 mg total) by mouth daily. (Patient not taking: Reported on 07/17/2018) 90 tablet 3   No current facility-administered medications for this visit.     Allergies  Allergen Reactions  . Gabapentin Other (See Comments)  . Levemir [Insulin Detemir] Dermatitis    Whelps at injection site  . Keflex [Cephalexin] Rash    Social History   Socioeconomic History  . Marital status: Married    Spouse name: Molli Hazard  . Number of children: 1  . Years of education: Not on file  . Highest education level: Not on file  Occupational  History  . Not on file  Social Needs  . Financial resource strain: Not on file  . Food insecurity:    Worry: Not on file    Inability: Not on file  . Transportation needs:    Medical: Not on file    Non-medical: Not on file  Tobacco Use  . Smoking status: Current Every Day Smoker    Packs/day: 0.50    Years: 20.00    Pack years: 10.00    Types: Cigarettes  . Smokeless tobacco: Never Used  Substance and Sexual Activity  . Alcohol use: No  . Drug use: No  . Sexual activity: Yes  Lifestyle  . Physical activity:    Days per week: Not on file    Minutes per session: Not on file  . Stress: Not on file  Relationships  . Social connections:    Talks on phone: Not on file    Gets together: Not on file    Attends religious service: Not on file    Active member of club or organization: Not on file    Attends meetings of clubs or organizations: Not on file    Relationship status: Not on file  . Intimate partner violence:    Fear of current or ex partner: Not on file    Emotionally abused: Not on file    Physically abused: Not on file    Forced sexual activity: Not on file  Other Topics Concern  . Not on file  Social History Narrative  . Not on file    ROS  Objective   Vitals as reported by the  patient: There were no vitals filed for this visit.  Diagnoses and all orders for this visit:  Upper respiratory infection with cough and congestion  Viral gastroenteritis   Advised pt to take symptomatic treatment She should continue self-isolation for the next 14 days  She should also monitor her albuterol requirement and if she has to use her albuterol more than every 6 hours to seek a high level of care She either has viral gastroenteritis or reaction to doxycycline She should eat small meals  Pt verbalized understanding She is still at risk for COVID19 but her symptoms are mild and testing is not indicated.   I discussed the assessment and treatment plan with the patient. The patient was provided an opportunity to ask questions and all were answered. The patient agreed with the plan and demonstrated an understanding of the instructions.   The patient was advised to call back or seek an in-person evaluation if the symptoms worsen or if the condition fails to improve as anticipated.  I provided 20 minutes of non-face-to-face time during this encounter.  Doristine Bosworth, MD  Primary Care at [facility]

## 2018-07-17 NOTE — Patient Instructions (Signed)
° ° ° °  If you have lab work done today you will be contacted with your lab results within the next 2 weeks.  If you have not heard from us then please contact us. The fastest way to get your results is to register for My Chart. ° ° °IF you received an x-ray today, you will receive an invoice from [facility]. Please contact  Radiology at [phone] with questions or concerns regarding your invoice.  ° °IF you received labwork today, you will receive an invoice from [facility]. Please contact [facility] at [phone] with questions or concerns regarding your invoice.  ° °Our billing staff will not be able to assist you with questions regarding bills from these companies. ° °You will be contacted with the lab results as soon as they are available. The fastest way to get your results is to activate your My Chart account. Instructions are located on the last page of this paperwork. If you have not heard from us regarding the results in 2 weeks, please contact this office. °  ° ° ° °

## 2018-07-23 ENCOUNTER — Telehealth: Payer: PRIVATE HEALTH INSURANCE | Admitting: Family

## 2018-07-23 DIAGNOSIS — R300 Dysuria: Secondary | ICD-10-CM

## 2018-07-23 MED ORDER — NITROFURANTOIN MONOHYD MACRO 100 MG PO CAPS: 100.0000 mg | ORAL_CAPSULE | Freq: Two times a day (BID) | ORAL | 0 refills | Status: DC

## 2018-07-23 NOTE — Progress Notes (Signed)

## 2018-07-24 ENCOUNTER — Telehealth (INDEPENDENT_AMBULATORY_CARE_PROVIDER_SITE_OTHER): Payer: Self-pay | Admitting: Family Medicine

## 2018-07-24 DIAGNOSIS — R05 Cough: Secondary | ICD-10-CM

## 2018-07-24 DIAGNOSIS — J069 Acute upper respiratory infection, unspecified: Secondary | ICD-10-CM

## 2018-07-24 DIAGNOSIS — R059 Cough, unspecified: Secondary | ICD-10-CM

## 2018-07-24 MED ORDER — BENZONATATE 100 MG PO CAPS: 100.0000 mg | ORAL_CAPSULE | Freq: Three times a day (TID) | ORAL | 0 refills | Status: DC | PRN

## 2018-07-24 MED ORDER — HYDROCODONE-HOMATROPINE 5-1.5 MG/5ML PO SYRP: 5.0000 mL | ORAL_SOLUTION | Freq: Three times a day (TID) | ORAL | 0 refills | Status: DC | PRN

## 2018-07-24 NOTE — Progress Notes (Signed)
Telemedicine Encounter- SOAP NOTE Established Patient  This telephone encounter was conducted with the patient's (or proxy's) verbal consent via audio telecommunications: yes/no: Yes Patient was instructed to have this encounter in a suitably private space; and to only have persons present to whom they give permission to participate. In addition, patient identity was confirmed by use of name plus two identifiers (DOB and address).  I discussed the limitations, risks, security and privacy concerns of performing an evaluation and management service by telephone and the availability of in person appointments. I also discussed with the patient that there may be a patient responsible charge related to this service. The patient expressed understanding and agreed to proceed.  I spent a total of TIME; 0 MIN TO 60 MIN: 15 minutes talking with the patient or their proxy.  No chief complaint on file.   Subjective   Kelsey Vang is a 43 y.o. established patient. Telephone visit today for  HPI   This is a follow up from her visit for URI a week ago She states that she is still feeling chills tmax is 99.6 She still has a dry cough and has some back soreness from cough She is coughing worse at night   Patient Active Problem List   Diagnosis Date Noted  . Tachycardia 01/04/2017  . Migraine with aura and without status migrainosus, not intractable 01/04/2017  . Palpitations 01/04/2017  . Obstructive apnea 04/23/2015  . Type 2 diabetes mellitus with diabetic polyneuropathy, with long-term current use of insulin (HCC) 12/27/2013  . Current tobacco use 12/27/2013  . Morbid (severe) obesity due to excess calories (HCC) 12/27/2013  . HLD (hyperlipidemia) 12/27/2013    Past Medical History:  Diagnosis Date  . Diabetes mellitus without complication (HCC)   . Hyperlipemia     Current Outpatient Medications  Medication Sig Dispense Refill  . benzonatate (TESSALON PERLES) 100 MG capsule  Take 1 capsule (100 mg total) by mouth 3 (three) times daily as needed. 20 capsule 0  . cetirizine (ZYRTEC) 10 MG tablet Take 10 mg by mouth daily.    . Cholecalciferol (VITAMIN D) 2000 units CAPS Take 1 capsule by mouth daily.    . Coenzyme Q10 (CO Q 10) 100 MG CAPS Take 1 capsule by mouth daily.    . diclofenac sodium (VOLTAREN) 1 % GEL Apply 2 g topically 4 (four) times daily. 100 g 0  . fluticasone (FLONASE) 50 MCG/ACT nasal spray Place 2 sprays into both nostrils daily. 16 g 6  . glucose blood (ACCU-CHEK GUIDE) test strip Use as instructed 100 each 12  . insulin glargine (LANTUS) 100 UNIT/ML injection Inject 0.3 mLs (30 Units total) into the skin at bedtime. Pens please. 45 mL 3  . insulin lispro (HUMALOG KWIKPEN) 100 UNIT/ML KwikPen INJECT 12 TO 15 UNITS INTO THE SKIN FOR A MEAL AS DIRECTED 45 mL 3  . insulin lispro (HUMALOG) 100 UNIT/ML injection Inject under skin 5-15 before the 3 meals. Pens please. 45 mL 3  . LANTUS SOLOSTAR 100 UNIT/ML Solostar Pen INECT 34 UNITS INTO THE SKIN AT BEDTIME 45 mL 3  . lisinopril (PRINIVIL,ZESTRIL) 5 MG tablet Take 1 tablet (5 mg total) by mouth daily. 30 tablet 0  . magnesium oxide (MAG-OX) 400 MG tablet Take 400 mg by mouth daily.    . medroxyPROGESTERone Acetate 150 MG/ML SUSY INJECT 1 ML INTRAMUSCULARLY ONCE EVERY 3 MONTHS.  4  . metFORMIN (GLUCOPHAGE-XR) 500 MG 24 hr tablet Take 2 tablets (1,000 mg total)  by mouth 2 (two) times daily with a meal. 360 tablet 3  . metoprolol succinate (TOPROL XL) 25 MG 24 hr tablet Take 1 tablet (25 mg total) by mouth daily. 90 tablet 0  . naproxen (NAPROSYN) 500 MG tablet Take 1 tablet (500 mg total) by mouth 2 (two) times daily. 30 tablet 0  . nitrofurantoin, macrocrystal-monohydrate, (MACROBID) 100 MG capsule Take 1 capsule (100 mg total) by mouth 2 (two) times daily. 10 capsule 0  . nortriptyline (PAMELOR) 10 MG capsule Take 1 capsule (10 mg total) by mouth at bedtime. 30 capsule 3  . Omega-3 Fatty Acids (FISH OIL)  1000 MG CAPS Take 1 capsule by mouth 2 (two) times daily.    . pantoprazole (PROTONIX) 40 MG tablet TAKE 1 TABLET BY MOUTH TWICE A DAY 180 tablet 1  . sertraline (ZOLOFT) 50 MG tablet Take 1 tablet (50 mg total) by mouth daily. 90 tablet 0  . SUMAtriptan (IMITREX) 100 MG tablet Take 1 tablet at earliest onset of migraine.  May repeat once in 2 hours if headache persists or recurs. 10 tablet 2  . tizanidine (ZANAFLEX) 2 MG capsule Take 1 capsule (2 mg total) by mouth 3 (three) times daily. 270 capsule 1  . UNIFINE PENTIPS 32G X 4 MM MISC USE TO INJECT INSULIN 4 TIMES DAILY 400 each 5  . ACCU-CHEK FASTCLIX LANCETS MISC Use to check sugars 2-3 times daily 102 each 11  . HYDROcodone-homatropine (HYCODAN) 5-1.5 MG/5ML syrup Take 5 mLs by mouth every 8 (eight) hours as needed for cough. 120 mL 0   No current facility-administered medications for this visit.     Allergies  Allergen Reactions  . Gabapentin Other (See Comments)  . Levemir [Insulin Detemir] Dermatitis    Whelps at injection site  . Keflex [Cephalexin] Rash    Social History   Socioeconomic History  . Marital status: Married    Spouse name: Molli HazardMatthew  . Number of children: 1  . Years of education: Not on file  . Highest education level: Not on file  Occupational History  . Not on file  Social Needs  . Financial resource strain: Not on file  . Food insecurity:    Worry: Not on file    Inability: Not on file  . Transportation needs:    Medical: Not on file    Non-medical: Not on file  Tobacco Use  . Smoking status: Current Every Day Smoker    Packs/day: 0.50    Years: 20.00    Pack years: 10.00    Types: Cigarettes  . Smokeless tobacco: Never Used  Substance and Sexual Activity  . Alcohol use: No  . Drug use: No  . Sexual activity: Yes  Lifestyle  . Physical activity:    Days per week: Not on file    Minutes per session: Not on file  . Stress: Not on file  Relationships  . Social connections:    Talks on  phone: Not on file    Gets together: Not on file    Attends religious service: Not on file    Active member of club or organization: Not on file    Attends meetings of clubs or organizations: Not on file    Relationship status: Not on file  . Intimate partner violence:    Fear of current or ex partner: Not on file    Emotionally abused: Not on file    Physically abused: Not on file    Forced sexual activity: Not  on file  Other Topics Concern  . Not on file  Social History Narrative  . Not on file    ROS  Objective   Vitals as reported by the patient: There were no vitals filed for this visit.  Diagnoses and all orders for this visit:  Cough -     HYDROcodone-homatropine (HYCODAN) 5-1.5 MG/5ML syrup; Take 5 mLs by mouth every 8 (eight) hours as needed for cough.  Upper respiratory infection with cough and congestion -   Discussed cough symptoms Advised hycodan at night Continue tessalon, albuterol and robitussin  Other orders -     benzonatate (TESSALON PERLES) 100 MG capsule; Take 1 capsule (100 mg total) by mouth 3 (three) times daily as needed.     I discussed the assessment and treatment plan with the patient. The patient was provided an opportunity to ask questions and all were answered. The patient agreed with the plan and demonstrated an understanding of the instructions.   The patient was advised to call back or seek an in-person evaluation if the symptoms worsen or if the condition fails to improve as anticipated.  I provided 15 minutes of non-face-to-face time during this encounter.  Doristine Bosworth, MD  Primary Care at [facility]

## 2018-07-24 NOTE — Progress Notes (Signed)
CC- URI- still have cough and pressure in chest and back area. Had a e-visit yesterday for UTI which meds they gave is listed in medications.

## 2018-07-24 NOTE — Patient Instructions (Signed)
° ° ° °  If you have lab work done today you will be contacted with your lab results within the next 2 weeks.  If you have not heard from us then please contact us. The fastest way to get your results is to register for My Chart. ° ° °IF you received an x-ray today, you will receive an invoice from [facility]. Please contact [facility] at [phone] with questions or concerns regarding your invoice.  ° °IF you received labwork today, you will receive an invoice from [facility]. Please contact [facility] at [phone] with questions or concerns regarding your invoice.  ° °Our billing staff will not be able to assist you with questions regarding bills from these companies. ° °You will be contacted with the lab results as soon as they are available. The fastest way to get your results is to activate your My Chart account. Instructions are located on the last page of this paperwork. If you have not heard from us regarding the results in 2 weeks, please contact this office. °  ° ° ° °

## 2018-08-02 ENCOUNTER — Other Ambulatory Visit: Payer: Self-pay | Admitting: Family Medicine

## 2018-08-02 DIAGNOSIS — R05 Cough: Secondary | ICD-10-CM

## 2018-08-02 DIAGNOSIS — R059 Cough, unspecified: Secondary | ICD-10-CM

## 2018-08-03 ENCOUNTER — Other Ambulatory Visit: Payer: Self-pay | Admitting: Family Medicine

## 2018-08-03 DIAGNOSIS — R059 Cough, unspecified: Secondary | ICD-10-CM

## 2018-08-03 DIAGNOSIS — R05 Cough: Secondary | ICD-10-CM

## 2018-08-03 NOTE — Telephone Encounter (Signed)
She was seen on tele-med 07/24/2018 and would like refills of the cough medication.

## 2018-08-04 MED ORDER — BENZONATATE 100 MG PO CAPS: 100.0000 mg | ORAL_CAPSULE | Freq: Three times a day (TID) | ORAL | 0 refills | Status: DC | PRN

## 2018-08-04 MED ORDER — HYDROCODONE-HOMATROPINE 5-1.5 MG/5ML PO SYRP: 5.0000 mL | ORAL_SOLUTION | Freq: Three times a day (TID) | ORAL | 0 refills | Status: DC | PRN

## 2018-08-20 ENCOUNTER — Telehealth: Payer: PRIVATE HEALTH INSURANCE | Admitting: Physician Assistant

## 2018-08-20 ENCOUNTER — Encounter: Payer: Self-pay | Admitting: Physician Assistant

## 2018-08-20 DIAGNOSIS — M549 Dorsalgia, unspecified: Secondary | ICD-10-CM | POA: Diagnosis not present

## 2018-08-20 MED ORDER — TIZANIDINE HCL 2 MG PO TABS: 4.0000 mg | ORAL_TABLET | Freq: Three times a day (TID) | ORAL | 0 refills | Status: DC | PRN

## 2018-08-20 NOTE — Progress Notes (Signed)

## 2018-08-28 ENCOUNTER — Encounter: Payer: Self-pay | Admitting: Family Medicine

## 2018-08-28 ENCOUNTER — Other Ambulatory Visit: Payer: Self-pay

## 2018-08-28 ENCOUNTER — Telehealth (INDEPENDENT_AMBULATORY_CARE_PROVIDER_SITE_OTHER): Payer: PRIVATE HEALTH INSURANCE | Admitting: Family Medicine

## 2018-08-28 DIAGNOSIS — J069 Acute upper respiratory infection, unspecified: Secondary | ICD-10-CM | POA: Diagnosis not present

## 2018-08-28 MED ORDER — AZITHROMYCIN 250 MG PO TABS: ORAL_TABLET | ORAL | 0 refills | Status: DC

## 2018-08-28 NOTE — Progress Notes (Signed)
Pt c/o cough and chest congestion follow up not getting any better from March.

## 2018-08-28 NOTE — Progress Notes (Signed)
Telemedicine Encounter- SOAP NOTE Established Patient  I discussed the limitations, risks, security and privacy concerns of performing an evaluation and management service by telephone and the availability of in person appointments. I also discussed with the patient that there may be a patient responsible charge related to this service. The patient expressed understanding and agreed to proceed.  This telephone encounter was conducted with the patient's  verbal consent via audio telecommunications: yes Patient was instructed to have this encounter in a suitably private space; and to only have persons present to whom they give permission to participate. In addition, patient identity was confirmed by use of name plus two identifiers (DOB and address).  I spent a total of 15 min talking with the patient   Pt c/o cough and chest congestion follow up not getting any better from March.           Subjective   Kelsey Vang is a 43 y.o. female established patient. Telephone visit today for continued URI symptoms  URI symptoms started in March with allergies Sinus and nasal congestion Sore throat Pt with difficulty sleeping and fatigue Highest -temp-99.9- elevation 2-3 days ago Cough-first thing morning-thick green mucous Not able to blow nose-dry Choking on mucous at night-down the back of throat Not short of breath Pt winded when working in her yard Oxygen levels above 97% Glucose readings -over 300 -sees endo Blood pressure reading 140/84, pulse 110-sitting 40units Lantus at night, metformin, Humalog with meals-endocrinology following-continue monitoring recommendations via chat Doxy did not work for infection previously Mucinex, delsym, saline, humidifier tried with limited help Albuterol prn-bronchitis last year-no diagnosis of asthma or COPD tob use-1 pk/day-pt has not been able to decrease amount of tob use  Patient Active Problem List   Diagnosis Date Noted  .  Tachycardia 01/04/2017  . Migraine with aura and without status migrainosus, not intractable 01/04/2017  . Palpitations 01/04/2017  . Obstructive apnea 04/23/2015  . Type 2 diabetes mellitus with diabetic polyneuropathy, with long-term current use of insulin (HCC) 12/27/2013  . Current tobacco use 12/27/2013  . Morbid (severe) obesity due to excess calories (HCC) 12/27/2013  . HLD (hyperlipidemia) 12/27/2013    Past Medical History:  Diagnosis Date  . Diabetes mellitus without complication (HCC)   . Hyperlipemia     Current Outpatient Medications  Medication Sig Dispense Refill  . ACCU-CHEK FASTCLIX LANCETS MISC Use to check sugars 2-3 times daily 102 each 11  . benzonatate (TESSALON PERLES) 100 MG capsule Take 1 capsule (100 mg total) by mouth 3 (three) times daily as needed. 20 capsule 0  . cetirizine (ZYRTEC) 10 MG tablet Take 10 mg by mouth daily.    . Cholecalciferol (VITAMIN D) 2000 units CAPS Take 1 capsule by mouth daily.    . Coenzyme Q10 (CO Q 10) 100 MG CAPS Take 1 capsule by mouth daily.    . diclofenac sodium (VOLTAREN) 1 % GEL Apply 2 g topically 4 (four) times daily. 100 g 0  . fluticasone (FLONASE) 50 MCG/ACT nasal spray Place 2 sprays into both nostrils daily. 16 g 6  . glucose blood (ACCU-CHEK GUIDE) test strip Use as instructed 100 each 12  . HYDROcodone-homatropine (HYCODAN) 5-1.5 MG/5ML syrup Take 5 mLs by mouth every 8 (eight) hours as needed for cough. 120 mL 0  . insulin glargine (LANTUS) 100 UNIT/ML injection Inject 0.3 mLs (30 Units total) into the skin at bedtime. Pens please. 45 mL 3  . insulin lispro (HUMALOG KWIKPEN) 100 UNIT/ML  KwikPen INJECT 12 TO 15 UNITS INTO THE SKIN FOR A MEAL AS DIRECTED 45 mL 3  . insulin lispro (HUMALOG) 100 UNIT/ML injection Inject under skin 5-15 before the 3 meals. Pens please. 45 mL 3  . LANTUS SOLOSTAR 100 UNIT/ML Solostar Pen INECT 34 UNITS INTO THE SKIN AT BEDTIME 45 mL 3  . lisinopril (PRINIVIL,ZESTRIL) 5 MG tablet Take 1  tablet (5 mg total) by mouth daily. 30 tablet 0  . magnesium oxide (MAG-OX) 400 MG tablet Take 400 mg by mouth daily.    . medroxyPROGESTERone Acetate 150 MG/ML SUSY INJECT 1 ML INTRAMUSCULARLY ONCE EVERY 3 MONTHS.  4  . metFORMIN (GLUCOPHAGE-XR) 500 MG 24 hr tablet Take 2 tablets (1,000 mg total) by mouth 2 (two) times daily with a meal. 360 tablet 3  . metoprolol succinate (TOPROL XL) 25 MG 24 hr tablet Take 1 tablet (25 mg total) by mouth daily. 90 tablet 0  . naproxen (NAPROSYN) 500 MG tablet Take 1 tablet (500 mg total) by mouth 2 (two) times daily. 30 tablet 0  . nitrofurantoin, macrocrystal-monohydrate, (MACROBID) 100 MG capsule Take 1 capsule (100 mg total) by mouth 2 (two) times daily. 10 capsule 0  . nortriptyline (PAMELOR) 10 MG capsule Take 1 capsule (10 mg total) by mouth at bedtime. 30 capsule 3  . Omega-3 Fatty Acids (FISH OIL) 1000 MG CAPS Take 1 capsule by mouth 2 (two) times daily.    . pantoprazole (PROTONIX) 40 MG tablet TAKE 1 TABLET BY MOUTH TWICE A DAY 180 tablet 1  . sertraline (ZOLOFT) 50 MG tablet Take 1 tablet (50 mg total) by mouth daily. 90 tablet 0  . SUMAtriptan (IMITREX) 100 MG tablet Take 1 tablet at earliest onset of migraine.  May repeat once in 2 hours if headache persists or recurs. 10 tablet 2  . tizanidine (ZANAFLEX) 2 MG capsule Take 1 capsule (2 mg total) by mouth 3 (three) times daily. 270 capsule 1  . tiZANidine (ZANAFLEX) 2 MG tablet Take 2 tablets (4 mg total) by mouth every 8 (eight) hours as needed for muscle spasms. 15 tablet 0  . UNIFINE PENTIPS 32G X 4 MM MISC USE TO INJECT INSULIN 4 TIMES DAILY 400 each 5   No current facility-administered medications for this visit.     Allergies  Allergen Reactions  . Gabapentin Other (See Comments)  . Levemir [Insulin Detemir] Dermatitis    Whelps at injection site  . Keflex [Cephalexin] Rash    Social History   Socioeconomic History  . Marital status: Married    Spouse name: Molli Hazard  . Number of  children: 1  . Years of education: Not on file  . Highest education level: Not on file  Occupational History  . Not on file  Social Needs  . Financial resource strain: Not on file  . Food insecurity:    Worry: Not on file    Inability: Not on file  . Transportation needs:    Medical: Not on file    Non-medical: Not on file  Tobacco Use  . Smoking status: Current Every Day Smoker    Packs/day: 0.50    Years: 20.00    Pack years: 10.00    Types: Cigarettes  . Smokeless tobacco: Never Used  Substance and Sexual Activity  . Alcohol use: No  . Drug use: No  . Sexual activity: Yes  Lifestyle  . Physical activity:    Days per week: Not on file    Minutes per session: Not  on file  . Stress: Not on file  Relationships  . Social connections:    Talks on phone: Not on file    Gets together: Not on file    Attends religious service: Not on file    Active member of club or organization: Not on file    Attends meetings of clubs or organizations: Not on file    Relationship status: Not on file  . Intimate partner violence:    Fear of current or ex partner: Not on file    Emotionally abused: Not on file    Physically abused: Not on file    Forced sexual activity: Not on file  Other Topics Concern  . Not on file  Social History Narrative  . Not on file    ROS CONSTITUTIONAL:  fever,no night sweats  EENT: sinus problems and nasal congestion,  Nosebleeds with blowing, no sore throat, no facial tenderness CV: no chest pain RESP: no SOB, cough with sputum production GI: no bowel changes NEURO: headaches related to facial pressure ENDO:DM-endo managing  Objective  Vitals as reported by the patient: Oxygen able 97% Glucose 300 + 1. Upper respiratory infection with cough and congestion zpack-rx-concern for bronchitis with potential underlying pulmonary concerns-pt agreed to in office visit for evaluation if no improvement with current treatment plan. Albuterol at least BID to  improve expansion of lungs I discussed the assessment and treatment plan with the patient. The patient was provided an opportunity to ask questions and all were answered. The patient agreed with the plan and demonstrated an understanding of the instructions.   The patient was advised to call back or seek an in-person evaluation if the symptoms worsen or if the condition fails to improve as anticipated.  I provided 15 minutes of non-face-to-face time during this encounter.   Mat Carne, MD  Primary Care at [facility] 5-12-2-0

## 2018-09-16 ENCOUNTER — Other Ambulatory Visit: Payer: Self-pay | Admitting: Cardiovascular Disease

## 2018-10-02 ENCOUNTER — Ambulatory Visit (INDEPENDENT_AMBULATORY_CARE_PROVIDER_SITE_OTHER): Payer: Self-pay | Admitting: Family Medicine

## 2018-10-02 ENCOUNTER — Other Ambulatory Visit: Payer: Self-pay

## 2018-10-02 ENCOUNTER — Encounter: Payer: Self-pay | Admitting: Family Medicine

## 2018-10-02 VITALS — BP 106/73 | HR 102 | Temp 98.9°F | Resp 20 | Ht 61.42 in | Wt 261.6 lb

## 2018-10-02 DIAGNOSIS — G4700 Insomnia, unspecified: Secondary | ICD-10-CM

## 2018-10-02 DIAGNOSIS — F32A Depression, unspecified: Secondary | ICD-10-CM

## 2018-10-02 DIAGNOSIS — F329 Major depressive disorder, single episode, unspecified: Secondary | ICD-10-CM

## 2018-10-02 DIAGNOSIS — M791 Myalgia, unspecified site: Secondary | ICD-10-CM

## 2018-10-02 DIAGNOSIS — R5383 Other fatigue: Secondary | ICD-10-CM

## 2018-10-02 MED ORDER — ZOLPIDEM TARTRATE 5 MG PO TABS: 5.0000 mg | ORAL_TABLET | Freq: Every evening | ORAL | 1 refills | Status: DC | PRN

## 2018-10-02 MED ORDER — SERTRALINE HCL 100 MG PO TABS: 100.0000 mg | ORAL_TABLET | Freq: Every day | ORAL | 3 refills | Status: DC
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
## 2018-10-02 NOTE — Patient Instructions (Signed)
° ° ° °  If you have lab work done today you will be contacted with your lab results within the next 2 weeks.  If you have not heard from us then please contact us. The fastest way to get your results is to register for My Chart. ° ° °IF you received an x-ray today, you will receive an invoice from [facility]. Please contact [facility] at [phone] with questions or concerns regarding your invoice.  ° °IF you received labwork today, you will receive an invoice from [facility]. Please contact [facility] at [phone] with questions or concerns regarding your invoice.  ° °Our billing staff will not be able to assist you with questions regarding bills from these companies. ° °You will be contacted with the lab results as soon as they are available. The fastest way to get your results is to activate your My Chart account. Instructions are located on the last page of this paperwork. If you have not heard from us regarding the results in 2 weeks, please contact this office. °  ° ° ° °

## 2018-10-03 LAB — CBC
Hematocrit: 43.3 % (ref 34.0–46.6)
Hemoglobin: 14.4 g/dL (ref 11.1–15.9)
MCH: 30.3 pg (ref 26.6–33.0)
MCHC: 33.3 g/dL (ref 31.5–35.7)
MCV: 91 fL (ref 79–97)
Platelets: 352 10*3/uL (ref 150–450)
RBC: 4.76 x10E6/uL (ref 3.77–5.28)
RDW: 13.6 % (ref 11.7–15.4)
WBC: 12.1 10*3/uL — ABNORMAL HIGH (ref 3.4–10.8)

## 2018-10-03 LAB — CMP14+EGFR
ALT: 37 IU/L — ABNORMAL HIGH (ref 0–32)
AST: 30 IU/L (ref 0–40)
Albumin/Globulin Ratio: 1.7 (ref 1.2–2.2)
Albumin: 4.2 g/dL (ref 3.8–4.8)
Alkaline Phosphatase: 85 IU/L (ref 39–117)
BUN/Creatinine Ratio: 12 (ref 9–23)
BUN: 9 mg/dL (ref 6–24)
Bilirubin Total: 0.3 mg/dL (ref 0.0–1.2)
CO2: 20 mmol/L (ref 20–29)
Calcium: 10.7 mg/dL — ABNORMAL HIGH (ref 8.7–10.2)
Chloride: 100 mmol/L (ref 96–106)
Creatinine, Ser: 0.74 mg/dL (ref 0.57–1.00)
GFR calc Af Amer: 116 mL/min/{1.73_m2} (ref >59)
GFR calc non Af Amer: 100 mL/min/{1.73_m2} (ref >59)
Globulin, Total: 2.5 g/dL (ref 1.5–4.5)
Glucose: 207 mg/dL — ABNORMAL HIGH (ref 65–99)
Potassium: 4.9 mmol/L (ref 3.5–5.2)
Sodium: 136 mmol/L (ref 134–144)
Total Protein: 6.7 g/dL (ref 6.0–8.5)

## 2018-10-03 LAB — TSH: TSH: 1.47 u[IU]/mL (ref 0.450–4.500)

## 2018-10-04 ENCOUNTER — Encounter: Payer: Self-pay | Admitting: Internal Medicine

## 2018-10-04 ENCOUNTER — Other Ambulatory Visit: Payer: Self-pay | Admitting: Family Medicine

## 2018-10-04 NOTE — Progress Notes (Signed)
Elevated CBC-repeat in 2 weeks. Are sign of infection-?burining with urination Cough? Or other concerns.

## 2018-10-12 ENCOUNTER — Other Ambulatory Visit: Payer: Self-pay | Admitting: Neurology

## 2018-10-22 ENCOUNTER — Other Ambulatory Visit: Payer: Self-pay | Admitting: Internal Medicine

## 2018-10-22 ENCOUNTER — Encounter: Payer: Self-pay | Admitting: Internal Medicine

## 2018-10-23 ENCOUNTER — Ambulatory Visit: Payer: PRIVATE HEALTH INSURANCE | Admitting: Neurology

## 2018-11-05 ENCOUNTER — Other Ambulatory Visit: Payer: Self-pay

## 2018-11-05 ENCOUNTER — Telehealth: Payer: Self-pay | Admitting: Family Medicine

## 2018-11-05 NOTE — Telephone Encounter (Signed)
Patient needs refill on linsopril  She would like a call back

## 2018-11-08 ENCOUNTER — Ambulatory Visit: Payer: PRIVATE HEALTH INSURANCE | Admitting: Internal Medicine

## 2018-11-08 ENCOUNTER — Telehealth: Payer: Self-pay

## 2018-11-08 NOTE — Telephone Encounter (Signed)
Patient had faxed 3 medication assistance forms for completion, however patient has not been seen since October 2019 and was notified she will need appointment. One of the medications requested in the forms is not even on her medication list and the insulin doses do not match.  Patient was scheduled for appointment today but canceled at the last minute due to covid concerns. We could have converted to a virtual visit but patient canceled through [place] and has not rescheduled.  Forms can not be completed without appointment.

## 2018-11-09 ENCOUNTER — Other Ambulatory Visit: Payer: Self-pay | Admitting: Emergency Medicine

## 2018-11-09 DIAGNOSIS — R5383 Other fatigue: Secondary | ICD-10-CM

## 2018-11-09 DIAGNOSIS — R000 Tachycardia, unspecified: Secondary | ICD-10-CM

## 2018-11-09 MED ORDER — LISINOPRIL 5 MG PO TABS: 5.0000 mg | ORAL_TABLET | Freq: Every day | ORAL | 0 refills | Status: DC

## 2018-11-09 NOTE — Telephone Encounter (Signed)
Patient was given rx to get her to her upcoming appt with [facility]

## 2018-11-14 ENCOUNTER — Other Ambulatory Visit: Payer: Self-pay

## 2018-11-14 ENCOUNTER — Telehealth (INDEPENDENT_AMBULATORY_CARE_PROVIDER_SITE_OTHER): Payer: Self-pay | Admitting: Family Medicine

## 2018-11-14 VITALS — Wt 252.0 lb

## 2018-11-14 DIAGNOSIS — F331 Major depressive disorder, recurrent, moderate: Secondary | ICD-10-CM

## 2018-11-14 DIAGNOSIS — F5104 Psychophysiologic insomnia: Secondary | ICD-10-CM

## 2018-11-14 MED ORDER — QUETIAPINE FUMARATE 100 MG PO TABS: ORAL_TABLET | ORAL | 3 refills | Status: DC

## 2018-11-14 NOTE — Progress Notes (Signed)
CC: 4 week f/u on sertraline dosage increase, f/u depression and sleeplessness x 2 mos. Per pt she is averaging about 3 hrs/night.  No travel outside the [place] or [place] in the past 3 weeks. Recent weight of 252 lb, no recent bp. Depression score: 18

## 2018-11-14 NOTE — Progress Notes (Signed)
Telemedicine Encounter- SOAP NOTE Established Patient  This telephone encounter was conducted with the patient's (or proxy's) verbal consent via audio telecommunications: yes/no: Yes Patient was instructed to have this encounter in a suitably private space; and to only have persons present to whom they give permission to participate. In addition, patient identity was confirmed by use of name plus two identifiers (DOB and address).  I discussed the limitations, risks, security and privacy concerns of performing an evaluation and management service by telephone and the availability of in person appointments. I also discussed with the patient that there may be a patient responsible charge related to this service. The patient expressed understanding and agreed to proceed.  I spent a total of TIME; 0 MIN TO 60 MIN: 15 minutes talking with the patient or their proxy.  No chief complaint on file.   Subjective   Kelsey Vang is a 43 y.o. established patient. Telephone visit today for  HPI   Patient reports that she is taking sertraline 100mg  at night  She sleeps and has a hard time falling asleep and staying asleep She stats that she saw Dr. Judee Claraorum and hte Remus Lofflerambien did not help at all Her weight is decreasing to help with her diabetes but states that she has not had an appetite. In the daytime she feels tired.  She states that her mind is racing and she feels fidgety and restless in the last couple weeks She denies panic attacks in the past few weeks but had panic attacks in the past. She has not insurance to see anyone medically.  Wt Readings from Last 3 Encounters:  11/14/18 252 lb (114.3 kg)  10/02/18 261 lb 9.6 oz (118.7 kg)  06/22/18 266 lb (120.7 kg)     [facility] 11/14/2018 11/14/2018 10/02/2018 08/28/2018 07/24/2018  Decreased Interest 0 3 0 0 0  Down, Depressed, Hopeless 3 3 0 0 0  PHQ - 2 Score 3 6 0 0 0  Altered sleeping 3 3 3  - -  Tired, decreased energy 3 3 3   - -  Change in appetite 2 3 3  - -  Feeling bad or failure about yourself  1 3 0 - -  Trouble concentrating 3 3 3  - -  Moving slowly or fidgety/restless 3 3 3  - -  Suicidal thoughts 0 3 3 - -  PHQ-9 Score 18 27 18  - -  Difficult doing work/chores Extremely dIfficult - Extremely dIfficult - -  Some recent data might be hidden     Patient Active Problem List   Diagnosis Date Noted  . Other fatigue 10/02/2018  . Depression 10/02/2018  . Myalgia 10/02/2018  . Upper respiratory infection with cough and congestion 08/28/2018  . Tachycardia 01/04/2017  . Palpitations 01/04/2017  . Obstructive apnea 04/23/2015  . Type 2 diabetes mellitus with diabetic polyneuropathy, with long-term current use of insulin (HCC) 12/27/2013  . Current tobacco use 12/27/2013  . Morbid (severe) obesity due to excess calories (HCC) 12/27/2013  . HLD (hyperlipidemia) 12/27/2013    Past Medical History:  Diagnosis Date  . Diabetes mellitus without complication (HCC)   . Hyperlipemia     Current Outpatient Medications  Medication Sig Dispense Refill  . ACCU-CHEK FASTCLIX LANCETS MISC Use to check sugars 2-3 times daily 102 each 11  . cetirizine (ZYRTEC) 10 MG tablet Take 10 mg by mouth daily.    . Cholecalciferol (VITAMIN D) 2000 units CAPS Take 1 capsule by mouth daily.    .Marland Kitchen  Coenzyme Q10 (CO Q 10) 100 MG CAPS Take 1 capsule by mouth daily.    . diclofenac sodium (VOLTAREN) 1 % GEL Apply 2 g topically 4 (four) times daily. 100 g 0  . fluticasone (FLONASE) 50 MCG/ACT nasal spray Place 2 sprays into both nostrils daily. 16 g 6  . glucose blood (ACCU-CHEK GUIDE) test strip Use as instructed 100 each 12  . insulin glargine (LANTUS) 100 UNIT/ML injection Inject 0.3 mLs (30 Units total) into the skin at bedtime. Pens please. 45 mL 3  . insulin lispro (HUMALOG KWIKPEN) 100 UNIT/ML KwikPen INJECT 12 TO 15 UNITS INTO THE SKIN FOR A MEAL AS DIRECTED 45 mL 3  . insulin lispro (HUMALOG) 100 UNIT/ML injection Inject  under skin 5-15 before the 3 meals. Pens please. 45 mL 3  . LANTUS SOLOSTAR 100 UNIT/ML Solostar Pen INECT 34 UNITS INTO THE SKIN AT BEDTIME 45 mL 3  . lisinopril (ZESTRIL) 5 MG tablet Take 1 tablet (5 mg total) by mouth daily. 30 tablet 0  . magnesium oxide (MAG-OX) 400 MG tablet Take 400 mg by mouth daily.    . medroxyPROGESTERone Acetate 150 MG/ML SUSY INJECT 1 ML INTRAMUSCULARLY ONCE EVERY 3 MONTHS.  4  . metoprolol succinate (TOPROL-XL) 25 MG 24 hr tablet TAKE 1 TABLET BY MOUTH ONCE DAILY. PLEASE SCHEDULE AN APPOINTMENT FOR FUTURE REFILLS. 90 tablet 0  . naproxen (NAPROSYN) 500 MG tablet Take 1 tablet (500 mg total) by mouth 2 (two) times daily. 30 tablet 0  . nortriptyline (PAMELOR) 10 MG capsule Take 1 capsule (10 mg total) by mouth at bedtime. 30 capsule 3  . Omega-3 Fatty Acids (FISH OIL) 1000 MG CAPS Take 1 capsule by mouth 2 (two) times daily.    . pantoprazole (PROTONIX) 40 MG tablet TAKE 1 TABLET BY MOUTH TWICE A DAY 180 tablet 1  . sertraline (ZOLOFT) 100 MG tablet Take 1 tablet (100 mg total) by mouth daily. 30 tablet 3  . SUMAtriptan (IMITREX) 100 MG tablet Take 1 tablet at earliest onset of migraine.  May repeat once in 2 hours if headache persists or recurs. 10 tablet 2  . tizanidine (ZANAFLEX) 2 MG capsule Take 1 capsule (2 mg total) by mouth 3 (three) times daily. 270 capsule 1  . tiZANidine (ZANAFLEX) 2 MG tablet TAKE 1 TABLET BY MOUTH THREE TIMES DAILY 270 tablet 0  . UNIFINE PENTIPS 32G X 4 MM MISC USE TO INJECT INSULIN 4 TIMES DAILY 400 each 5  . metFORMIN (GLUCOPHAGE-XR) 500 MG 24 hr tablet Take 2 tablets (1,000 mg total) by mouth 2 (two) times daily with a meal. (Patient not taking: Reported on 11/14/2018) 360 tablet 3  . QUEtiapine (SEROQUEL) 100 MG tablet Take half tablet for one week then increase to one tablet by mouth at bedtime 30 tablet 3   No current facility-administered medications for this visit.     Allergies  Allergen Reactions  . Gabapentin Other (See  Comments)  . Levemir [Insulin Detemir] Dermatitis    Whelps at injection site  . Keflex [Cephalexin] Rash    Social History   Socioeconomic History  . Marital status: Married    Spouse name: Rodman Key  . Number of children: 1  . Years of education: Not on file  . Highest education level: Not on file  Occupational History  . Not on file  Social Needs  . Financial resource strain: Not on file  . Food insecurity    Worry: Not on file  Inability: Not on file  . Transportation needs    Medical: Not on file    Non-medical: Not on file  Tobacco Use  . Smoking status: Current Every Day Smoker    Packs/day: 0.50    Years: 20.00    Pack years: 10.00    Types: Cigarettes  . Smokeless tobacco: Never Used  Substance and Sexual Activity  . Alcohol use: No  . Drug use: No  . Sexual activity: Yes    Birth control/protection: Injection  Lifestyle  . Physical activity    Days per week: Not on file    Minutes per session: Not on file  . Stress: Not on file  Relationships  . Social Musician on phone: Not on file    Gets together: Not on file    Attends religious service: Not on file    Active member of club or organization: Not on file    Attends meetings of clubs or organizations: Not on file    Relationship status: Not on file  . Intimate partner violence    Fear of current or ex partner: Not on file    Emotionally abused: Not on file    Physically abused: Not on file    Forced sexual activity: Not on file  Other Topics Concern  . Not on file  Social History Narrative  . Not on file    ROS  Objective   Vitals as reported by the patient: Today's Vitals   11/14/18 1639  Weight: 252 lb (114.3 kg)    Diagnoses and all orders for this visit:  Psychophysiological insomnia  Major depressive disorder, recurrent episode, moderate (HCC)  Other orders -     QUEtiapine (SEROQUEL) 100 MG tablet; Take half tablet for one week then increase to one tablet by  mouth at bedtime   Will discontinue ambien Discussed that if seroquel does not help her then she will need a sleep study This medication does not mean she should not get a sleep study anyway but at least in the short term better sleep will help her mood  I discussed the assessment and treatment plan with the patient. The patient was provided an opportunity to ask questions and all were answered. The patient agreed with the plan and demonstrated an understanding of the instructions.   The patient was advised to call back or seek an in-person evaluation if the symptoms worsen or if the condition fails to improve as anticipated.  I provided 15 minutes of non-face-to-face time during this encounter.  Doristine Bosworth, MD  Primary Care at [facility]

## 2018-11-14 NOTE — Progress Notes (Signed)
Pt is wanting to talk about not being able to sleep and depression. Phq 9 completed. Says the depression she is having is stemming from last yr during Oct  2019-April 2020. She was given [place] for sleep, she is no longer taking it. Says it did not work. She also feels that the increase in the zoloft she is on is not working either. She is looking to discuss changing the medication. She is also having lots of pain due to the tenseness from being anxious all the time

## 2018-11-27 ENCOUNTER — Telehealth: Payer: Self-pay | Admitting: Family Medicine

## 2018-11-27 NOTE — Telephone Encounter (Signed)
LVM to schedule 3 month f/u per Dr. Nolon Rod.

## 2018-11-27 NOTE — Progress Notes (Signed)
LVM to schedule 3 month f/u

## 2018-12-04 ENCOUNTER — Telehealth: Payer: PRIVATE HEALTH INSURANCE | Admitting: Family

## 2018-12-04 DIAGNOSIS — R300 Dysuria: Secondary | ICD-10-CM | POA: Diagnosis not present

## 2018-12-04 MED ORDER — NITROFURANTOIN MONOHYD MACRO 100 MG PO CAPS: 100.0000 mg | ORAL_CAPSULE | Freq: Two times a day (BID) | ORAL | 0 refills | Status: DC

## 2018-12-04 NOTE — Progress Notes (Signed)

## 2018-12-16 ENCOUNTER — Other Ambulatory Visit: Payer: Self-pay | Admitting: Neurology

## 2018-12-16 ENCOUNTER — Telehealth: Payer: PRIVATE HEALTH INSURANCE | Admitting: Nurse Practitioner

## 2018-12-16 DIAGNOSIS — J0100 Acute maxillary sinusitis, unspecified: Secondary | ICD-10-CM

## 2018-12-16 MED ORDER — CLINDAMYCIN HCL 300 MG PO CAPS: 300.0000 mg | ORAL_CAPSULE | Freq: Three times a day (TID) | ORAL | 0 refills | Status: DC

## 2018-12-16 NOTE — Progress Notes (Signed)
We are sorry that you are not feeling well.  Here is how we plan to help!  Based on what you have shared with me it looks like you have sinusitis.  Sinusitis is inflammation and infection in the sinus cavities of the head.  Based on your presentation I believe you most likely have Acute Bacterial Sinusitis.  This is an infection caused by bacteria and is treated with antibiotics. I have prescribed clindamycin 300mg  one tablet 3x daily with food, for 7 days. You may use an oral decongestant such as Mucinex D or if you have glaucoma or high blood pressure use plain Mucinex. Saline nasal spray help and can safely be used as often as needed for congestion.  If you develop worsening sinus pain, fever or notice severe headache and vision changes, or if symptoms are not better after completion of antibiotic, please schedule an appointment with a health care provider.    Sinus infections are not as easily transmitted as other respiratory infection, however we still recommend that you avoid close contact with loved ones, especially the very young and elderly.  Remember to wash your hands thoroughly throughout the day as this is the number one way to prevent the spread of infection!  Home Care:  Only take medications as instructed by your medical team.  Complete the entire course of an antibiotic.  Do not take these medications with alcohol.  A steam or ultrasonic humidifier can help congestion.  You can place a towel over your head and breathe in the steam from hot water coming from a faucet.  Avoid close contacts especially the very young and the elderly.  Cover your mouth when you cough or sneeze.  Always remember to wash your hands.  Get Help Right Away If:  You develop worsening fever or sinus pain.  You develop a severe head ache or visual changes.  Your symptoms persist after you have completed your treatment plan.  Make sure you  Understand these instructions.  Will watch your  condition.  Will get help right away if you are not doing well or get worse.  Your e-visit answers were reviewed by a board certified advanced clinical practitioner to complete your personal care plan.  Depending on the condition, your plan could have included both over the counter or prescription medications.  If there is a problem please reply  once you have received a response from your provider.  Your safety is important to [place].  If you have drug allergies check your prescription carefully.    You can use MyChart to ask questions about today's visit, request a non-urgent call back, or ask for a work or school excuse for 24 hours related to this e-Visit. If it has been greater than 24 hours you will need to follow up with your provider, or enter a new e-Visit to address those concerns.  You will get an e-mail in the next two days asking about your experience.  I hope that your e-visit has been valuable and will speed your recovery. Thank you for using e-visits.  5-10 minutes spent reviewing and documenting in chart.

## 2019-01-07 ENCOUNTER — Telehealth: Payer: Self-pay | Admitting: Family Medicine

## 2019-01-17 ENCOUNTER — Encounter: Payer: Self-pay | Admitting: Adult Health Nurse Practitioner

## 2019-01-17 ENCOUNTER — Telehealth (INDEPENDENT_AMBULATORY_CARE_PROVIDER_SITE_OTHER): Payer: Self-pay | Admitting: Adult Health Nurse Practitioner

## 2019-01-17 DIAGNOSIS — J329 Chronic sinusitis, unspecified: Secondary | ICD-10-CM

## 2019-01-17 HISTORY — DX: Chronic sinusitis, unspecified: J32.9

## 2019-01-17 MED ORDER — AMOXICILLIN-POT CLAVULANATE 875-125 MG PO TABS: 1.0000 | ORAL_TABLET | Freq: Two times a day (BID) | ORAL | 0 refills | Status: DC

## 2019-01-17 NOTE — Progress Notes (Signed)
Telemedicine Encounter- SOAP NOTE Established Patient  This telephone encounter was conducted with the patient's (or proxy's) verbal consent via audio telecommunications: yes/no: Yes Patient was instructed to have this encounter in a suitably private space; and to only have persons present to whom they give permission to participate. In addition, patient identity was confirmed by use of name plus two identifiers (DOB and address).  I discussed the limitations, risks, security and privacy concerns of performing an evaluation and management service by telephone and the availability of in person appointments. I also discussed with the patient that there may be a patient responsible charge related to this service. The patient expressed understanding and agreed to proceed.  I spent a total of TIME; 0 MIN TO 60 MIN: 15 minutes talking with the patient or their proxy.  Chief Complaint  Patient presents with  . Headache    x6 days, OTC meds taken  . Sinus Problem    sinus pressure    Subjective   Kelsey Vang is a 43 y.o. established patient. Telephone visit today for sinus headache  HPI  Patient reports sinus headaches for 6 days.  Located frontally.  Unchanged with OTC medications. Pressure like feeling.  Reports she recently "had a sinus abscess."  When questioned how that was diagnosed, she stated, "virtually."  She is a 1/2 PPD smoker and is thinking about quitting.  She is having some drainage from her sinuses and reports she uses a lavage daily.  Denies fever, chills, SOB, or loss of taste or smell.   Patient Active Problem List   Diagnosis Date Noted  . Sinusitis, chronic 01/17/2019  . Other fatigue 10/02/2018  . Depression 10/02/2018  . Myalgia 10/02/2018  . Upper respiratory infection with cough and congestion 08/28/2018  . Tachycardia 01/04/2017  . Palpitations 01/04/2017  . Obstructive apnea 04/23/2015  . Type 2 diabetes mellitus with diabetic polyneuropathy, with  long-term current use of insulin (HCC) 12/27/2013  . Current tobacco use 12/27/2013  . Morbid (severe) obesity due to excess calories (HCC) 12/27/2013  . HLD (hyperlipidemia) 12/27/2013    Past Medical History:  Diagnosis Date  . Diabetes mellitus without complication (HCC)   . Hyperlipemia   . Sinusitis, chronic 01/17/2019    Current Outpatient Medications  Medication Sig Dispense Refill  . ACCU-CHEK FASTCLIX LANCETS MISC Use to check sugars 2-3 times daily 102 each 11  . amoxicillin-clavulanate (AUGMENTIN) 875-125 MG tablet Take 1 tablet by mouth 2 (two) times daily. 20 tablet 0  . cetirizine (ZYRTEC) 10 MG tablet Take 10 mg by mouth daily.    . Cholecalciferol (VITAMIN D) 2000 units CAPS Take 1 capsule by mouth daily.    . clindamycin (CLEOCIN) 300 MG capsule Take 1 capsule (300 mg total) by mouth 3 (three) times daily. (Patient not taking: Reported on 01/17/2019) 30 capsule 0  . Coenzyme Q10 (CO Q 10) 100 MG CAPS Take 1 capsule by mouth daily.    . diclofenac sodium (VOLTAREN) 1 % GEL Apply 2 g topically 4 (four) times daily. 100 g 0  . fluticasone (FLONASE) 50 MCG/ACT nasal spray Place 2 sprays into both nostrils daily. 16 g 6  . glucose blood (ACCU-CHEK GUIDE) test strip Use as instructed 100 each 12  . insulin glargine (LANTUS) 100 UNIT/ML injection Inject 0.3 mLs (30 Units total) into the skin at bedtime. Pens please. 45 mL 3  . insulin lispro (HUMALOG KWIKPEN) 100 UNIT/ML KwikPen INJECT 12 TO 15 UNITS INTO THE SKIN FOR  A MEAL AS DIRECTED 45 mL 3  . insulin lispro (HUMALOG) 100 UNIT/ML injection Inject under skin 5-15 before the 3 meals. Pens please. 45 mL 3  . LANTUS SOLOSTAR 100 UNIT/ML Solostar Pen INECT 34 UNITS INTO THE SKIN AT BEDTIME 45 mL 3  . lisinopril (ZESTRIL) 5 MG tablet Take 1 tablet (5 mg total) by mouth daily. 30 tablet 0  . magnesium oxide (MAG-OX) 400 MG tablet Take 400 mg by mouth daily.    . medroxyPROGESTERone Acetate 150 MG/ML SUSY INJECT 1 ML INTRAMUSCULARLY  ONCE EVERY 3 MONTHS.  4  . metFORMIN (GLUCOPHAGE-XR) 500 MG 24 hr tablet Take 2 tablets (1,000 mg total) by mouth 2 (two) times daily with a meal. (Patient not taking: Reported on 11/14/2018) 360 tablet 3  . metoprolol succinate (TOPROL-XL) 25 MG 24 hr tablet TAKE 1 TABLET BY MOUTH ONCE DAILY. PLEASE SCHEDULE AN APPOINTMENT FOR FUTURE REFILLS. 90 tablet 0  . naproxen (NAPROSYN) 500 MG tablet Take 1 tablet (500 mg total) by mouth 2 (two) times daily. 30 tablet 0  . nitrofurantoin, macrocrystal-monohydrate, (MACROBID) 100 MG capsule Take 1 capsule (100 mg total) by mouth 2 (two) times daily. 10 capsule 0  . nortriptyline (PAMELOR) 10 MG capsule Take 1 capsule by mouth at bedtime 30 capsule 0  . Omega-3 Fatty Acids (FISH OIL) 1000 MG CAPS Take 1 capsule by mouth 2 (two) times daily.    . pantoprazole (PROTONIX) 40 MG tablet TAKE 1 TABLET BY MOUTH TWICE A DAY 180 tablet 1  . QUEtiapine (SEROQUEL) 100 MG tablet Take half tablet for one week then increase to one tablet by mouth at bedtime 30 tablet 3  . sertraline (ZOLOFT) 100 MG tablet Take 1 tablet (100 mg total) by mouth daily. 30 tablet 3  . SUMAtriptan (IMITREX) 100 MG tablet Take 1 tablet at earliest onset of migraine.  May repeat once in 2 hours if headache persists or recurs. 10 tablet 2  . tizanidine (ZANAFLEX) 2 MG capsule Take 1 capsule (2 mg total) by mouth 3 (three) times daily. 270 capsule 1  . tiZANidine (ZANAFLEX) 2 MG tablet TAKE 1 TABLET BY MOUTH THREE TIMES DAILY 270 tablet 0  . UNIFINE PENTIPS 32G X 4 MM MISC USE TO INJECT INSULIN 4 TIMES DAILY 400 each 5   No current facility-administered medications for this visit.     Allergies  Allergen Reactions  . Gabapentin Other (See Comments)  . Levemir [Insulin Detemir] Dermatitis    Whelps at injection site  . Keflex [Cephalexin] Rash    Social History   Socioeconomic History  . Marital status: Married    Spouse name: Molli Hazard  . Number of children: 1  . Years of education: Not  on file  . Highest education level: Not on file  Occupational History  . Not on file  Social Needs  . Financial resource strain: Not on file  . Food insecurity    Worry: Not on file    Inability: Not on file  . Transportation needs    Medical: Not on file    Non-medical: Not on file  Tobacco Use  . Smoking status: Current Every Day Smoker    Packs/day: 0.50    Years: 20.00    Pack years: 10.00    Types: Cigarettes  . Smokeless tobacco: Never Used  Substance and Sexual Activity  . Alcohol use: No  . Drug use: No  . Sexual activity: Yes    Birth control/protection: Injection  Lifestyle  .  Physical activity    Days per week: Not on file    Minutes per session: Not on file  . Stress: Not on file  Relationships  . Social Herbalist on phone: Not on file    Gets together: Not on file    Attends religious service: Not on file    Active member of club or organization: Not on file    Attends meetings of clubs or organizations: Not on file    Relationship status: Not on file  . Intimate partner violence    Fear of current or ex partner: Not on file    Emotionally abused: Not on file    Physically abused: Not on file    Forced sexual activity: Not on file  Other Topics Concern  . Not on file  Social History Narrative  . Not on file    Review of Systems  Constitutional: Positive for malaise/fatigue. Negative for chills and fever.  HENT: Positive for congestion and sinus pain.   Eyes: Negative.   Respiratory: Negative.   Skin: Negative.   Neurological: Positive for headaches.    Objective    GEN: WDWN, NAD, Alert & Oriented x 3 PSYCH: Normally interactive. Conversant. Not depressed or anxious appearing.  Calm demeanor.    Vitals as reported by the patient: There were no vitals filed for this visit.  Afifa was seen today for headache and sinus problem.  Diagnoses and all orders for this visit:  Chronic sinusitis, unspecified location  Other orders  -     amoxicillin-clavulanate (AUGMENTIN) 875-125 MG tablet; Take 1 tablet by mouth 2 (two) times daily.  I discussed with the patient her symptoms and previous treatments.  I will go ahead and treat her with Augmentin.  Advised to take the entire course.  In the future, before dispensing more antibiotics for this patient, I would recommend a referral to ENT and/or a CT of her sinuses.  She requested a work note for yesterday and today. I advised the patient that she would be okay to go to work tomorrow.  She verbalized understanding.    I discussed the assessment and treatment plan with the patient. The patient was provided an opportunity to ask questions and all were answered. The patient agreed with the plan and demonstrated an understanding of the instructions.   The patient was advised to call back or seek an in-person evaluation if the symptoms worsen or if the condition fails to improve as anticipated.  I provided 15 minutes of non-face-to-face time during this encounter.  Glyn Ade, NP  Primary Care at [facility]

## 2019-01-18 ENCOUNTER — Telehealth: Payer: Self-pay | Admitting: Adult Health Nurse Practitioner

## 2019-01-18 ENCOUNTER — Encounter: Payer: Self-pay | Admitting: Adult Health Nurse Practitioner

## 2019-01-18 ENCOUNTER — Other Ambulatory Visit: Payer: Self-pay | Admitting: Adult Health Nurse Practitioner

## 2019-01-18 MED ORDER — ONDANSETRON HCL 4 MG PO TABS: 4.0000 mg | ORAL_TABLET | Freq: Three times a day (TID) | ORAL | 0 refills | Status: DC | PRN

## 2019-01-18 NOTE — Telephone Encounter (Signed)
Pt called to let Jens Som know that her headache is much worse and wants to extend doctors note for returning to work. And would like something called in for nausea .  FR   [phone] FR

## 2019-01-25 ENCOUNTER — Other Ambulatory Visit: Payer: Self-pay | Admitting: Neurology

## 2019-01-25 NOTE — Telephone Encounter (Signed)
Requested Prescriptions   Pending Prescriptions Disp Refills  . nortriptyline (PAMELOR) 10 MG capsule [Pharmacy Med Name: Nortriptyline HCl 10 MG Oral Capsule] 30 capsule 0    Sig: Take 1 capsule by mouth at bedtime   Rx last filled:12/17/18 #30 0 refills  Pt last seen:06/22/18   Follow up appt scheduled:none

## 2019-02-01 ENCOUNTER — Other Ambulatory Visit: Payer: Self-pay | Admitting: Family Medicine

## 2019-02-02 NOTE — Telephone Encounter (Signed)
Requested Prescriptions  Pending Prescriptions Disp Refills  . sertraline (ZOLOFT) 100 MG tablet [Pharmacy Med Name: Sertraline HCl 100 MG Oral Tablet] 30 tablet 0    Sig: Take 1 tablet by mouth once daily     Psychiatry:  Antidepressants - SSRI Passed - 02/01/2019  9:20 PM      Passed - Valid encounter within last 6 months    Recent Outpatient Visits          2 weeks ago Chronic sinusitis, unspecified location   Primary Care at [facility], Lorelee Market, NP   2 months ago Psychophysiological insomnia   Primary Care at [facility], Arlie Solomons, MD   4 months ago Other fatigue   Primary Care at [facility], Rex Kras, MD   5 months ago Upper respiratory infection with cough and congestion   Primary Care at [facility], Rex Kras, MD   6 months ago Cough   Primary Care at [place], MD             Passed - Completed PHQ-2 or PHQ-9 in the last 360 days.

## 2019-02-05 DIAGNOSIS — I10 Essential (primary) hypertension: Secondary | ICD-10-CM | POA: Insufficient documentation

## 2019-02-26 ENCOUNTER — Telehealth: Payer: Self-pay | Admitting: *Deleted

## 2019-02-26 NOTE — Telephone Encounter (Signed)
Received renewal from CoverMyMeds for Ajovy but it appeared she was no longer using this med. I called to clarify. She stated she lost her insurance so was not taking this medication. She said she now has new insurance and wants to start this med again. I advised she make an appointment with Dr. Tomi Likens to get her medications back on track and we will get a prior authorization if needed at that point. She agreed that was a good plan and said she will call our office later because she was at work presently.

## 2019-07-18 IMAGING — DX DG HIP (WITH OR WITHOUT PELVIS) 2-3V*R*
3 series · 3 of 3 positions shown · non-contrast
Comparison: MRI lumbar spine dated 09/13/2017. plain film of the
sacrum/coccyx dated 11/10/2016

CLINICAL DATA: Low back pain for 1 week.  Worsening pain.

EXAM:
DG HIP (WITH OR WITHOUT PELVIS) 2-3V RIGHT

[hip ap]
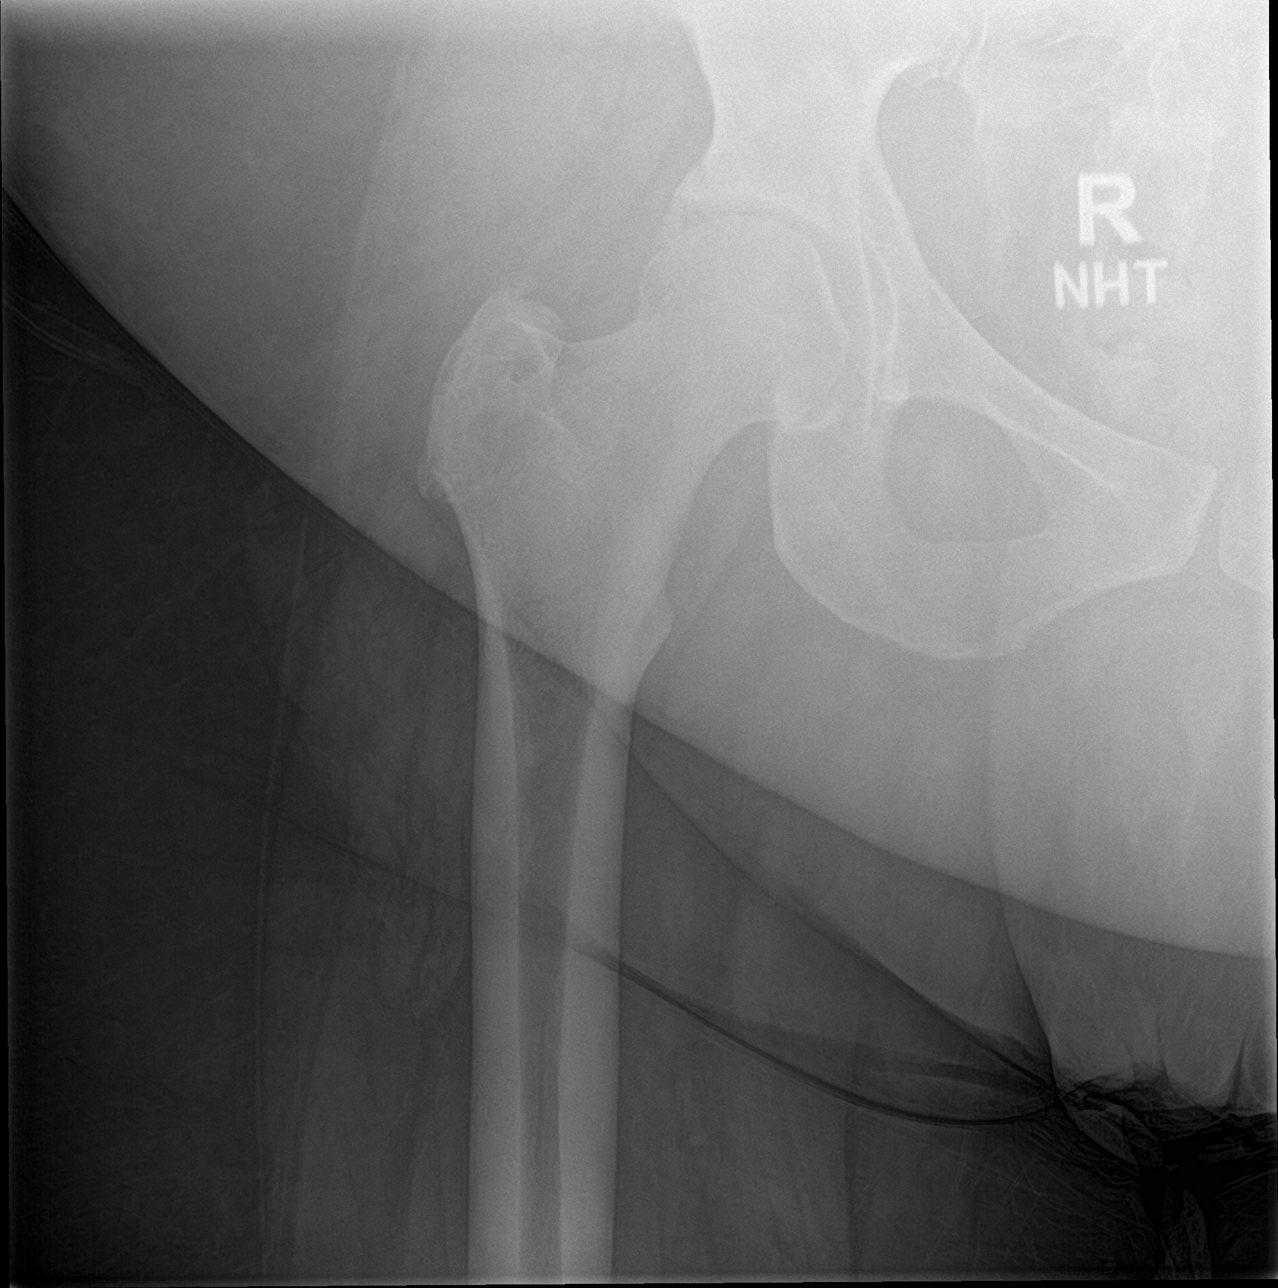

[hip lat]
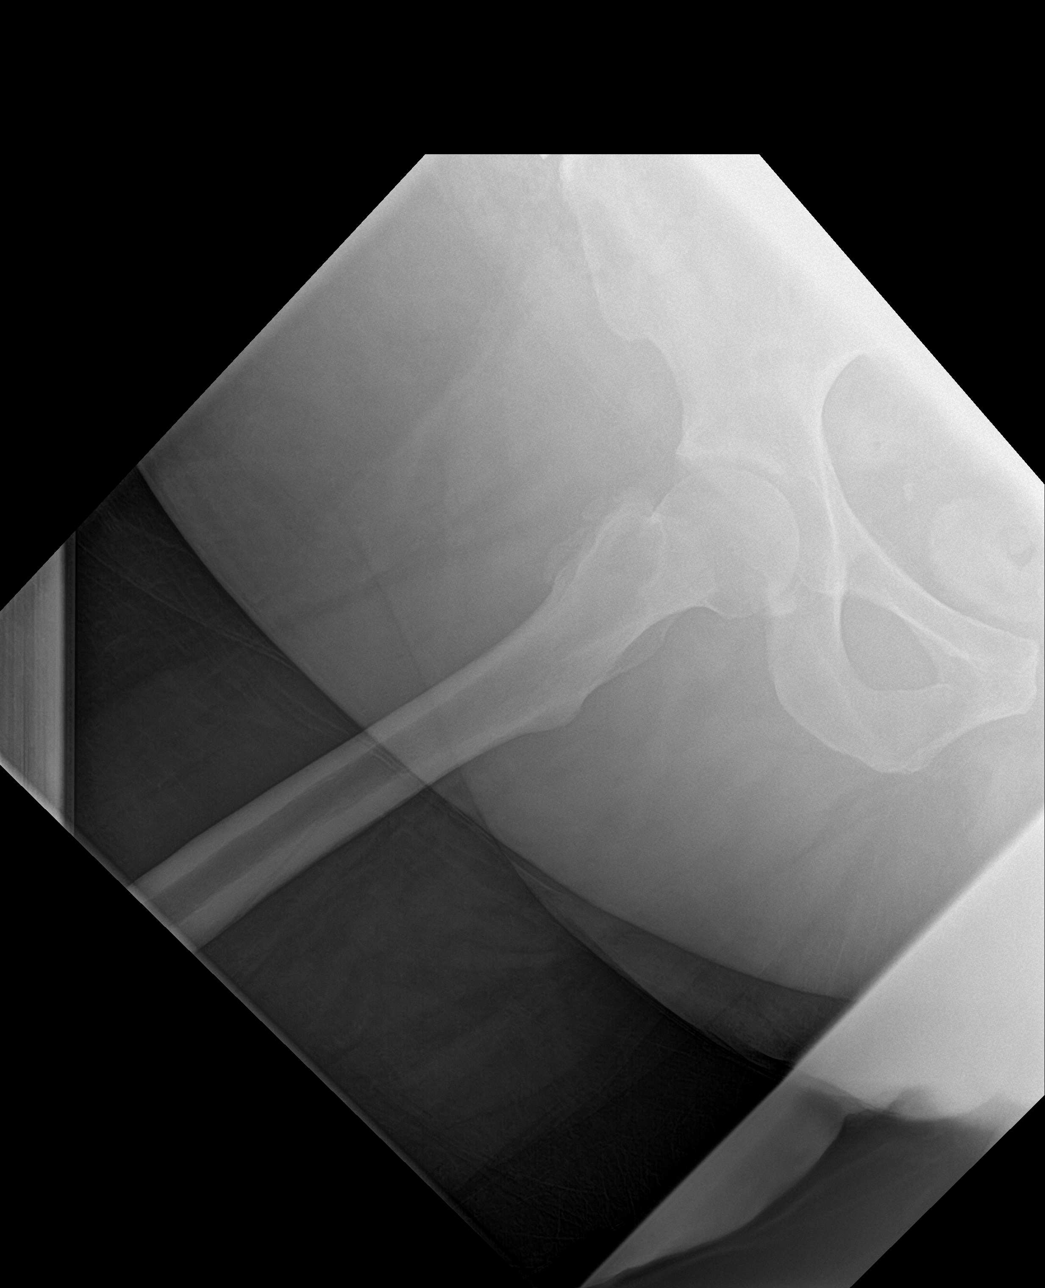

[pelvis ap]
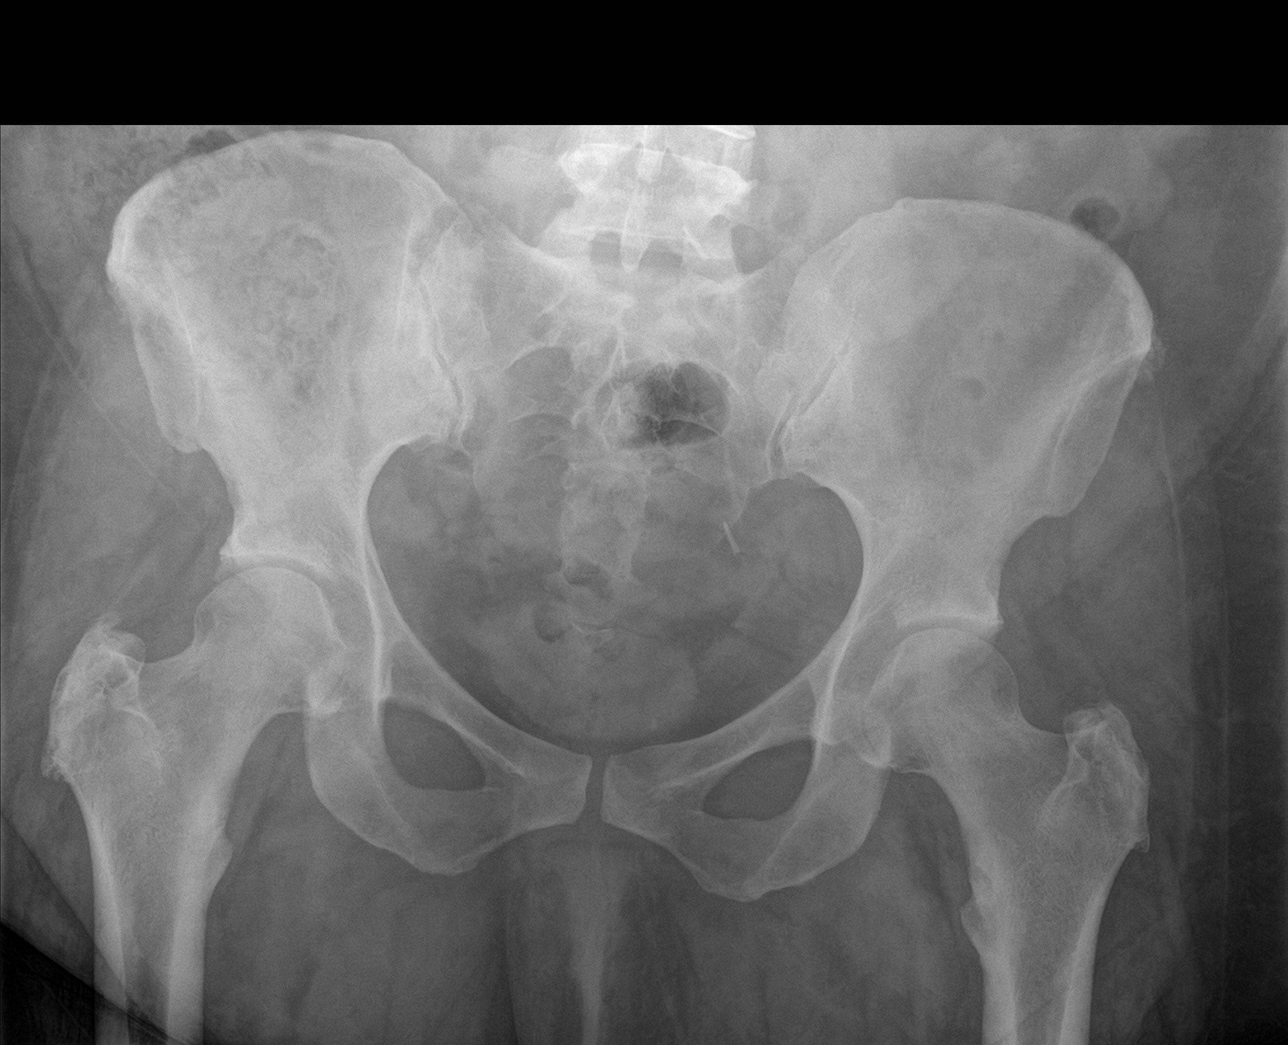

[3 of 3 positions shown; findings below may reference images not displayed]

FINDINGS: Single view of the pelvis and two views of the RIGHT hip are
provided. Osseous alignment is normal. Bone mineralization appears
normal. No fracture line or displaced fracture fragment seen. No
acute or suspicious osseous lesion. No significant degenerative
change at either hip joint. Soft tissues about the pelvis and RIGHT
hip are unremarkable.
IMPRESSION: Negative.

## 2019-07-24 IMAGING — CT CT ABD-PELV W/ CM
2 of 5 series · 17 of 46 positions shown, 19 images · IV contrast (APPLIED)
Comparison: None.

CLINICAL DATA: Abdominal pain for the past 2-3 weeks.

EXAM:
CT ABDOMEN AND PELVIS WITH CONTRAST
TECHNIQUE: Multidetector CT imaging of the abdomen and pelvis was performed
using the standard protocol following bolus administration of
intravenous contrast.
CONTRAST:  100mL TYBBOH-0II IOPAMIDOL (TYBBOH-0II) INJECTION 61%

[Series 2: axial st · axial · 0.86mm/px · z∈[-467,-47]mm · 14 of 94 slices shown, 16 images]
[im 5/94  soft-tissue]
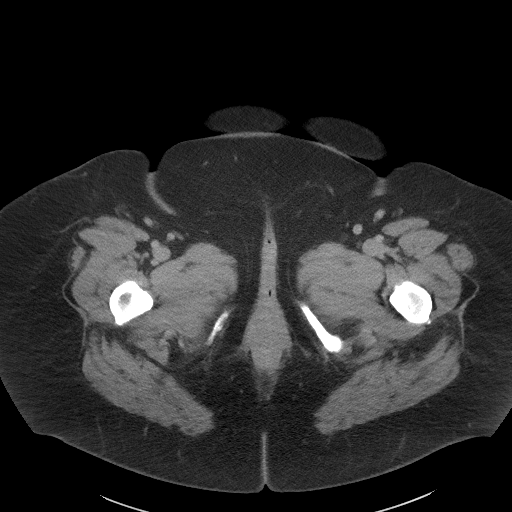
[im 5/94  bone]
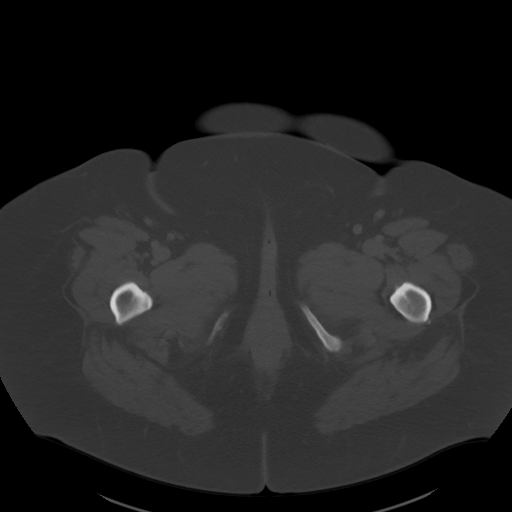
[im 10/94  soft-tissue]
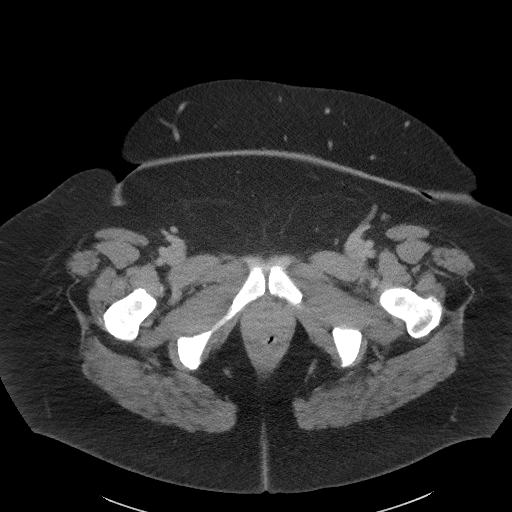
[im 20/94  soft-tissue]
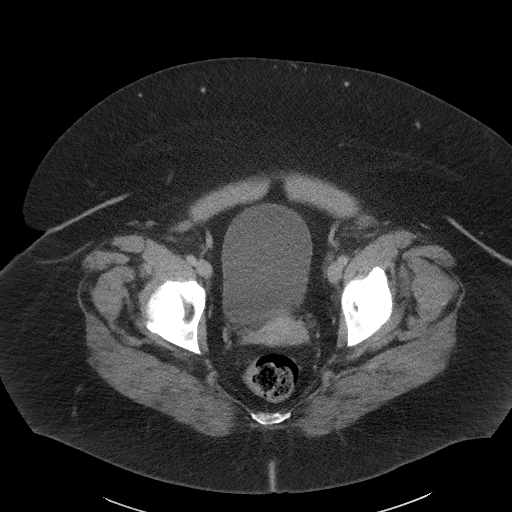
[im 25/94  soft-tissue]
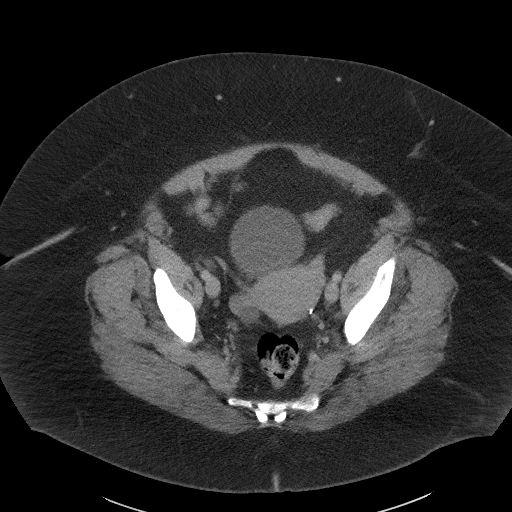
[im 30/94  soft-tissue]
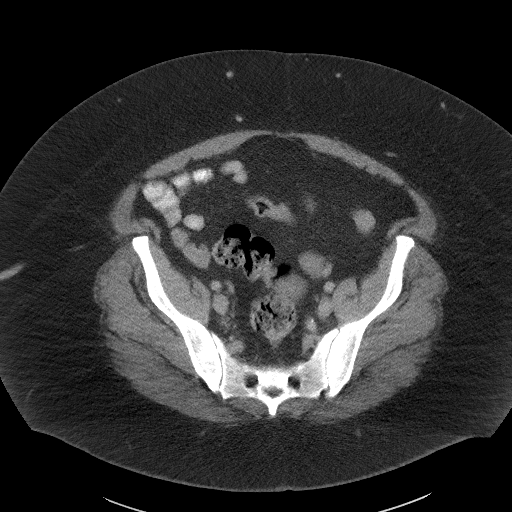
[im 40/94  soft-tissue]
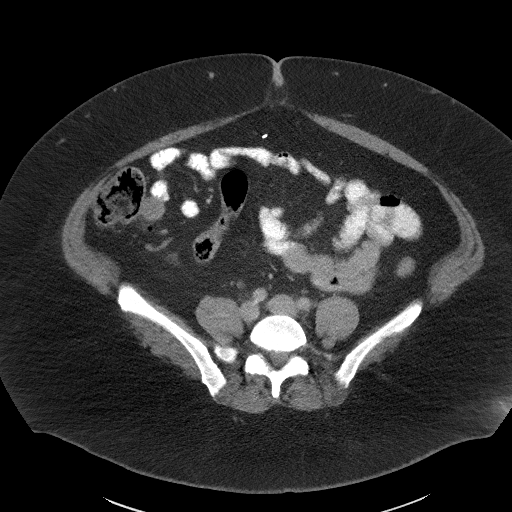
[im 45/94  soft-tissue]
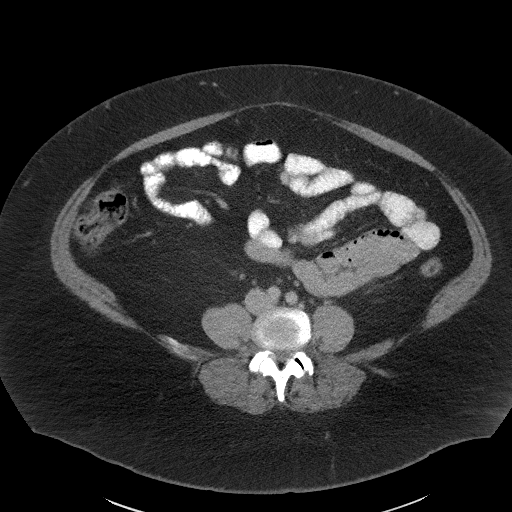
[im 49/94  soft-tissue]
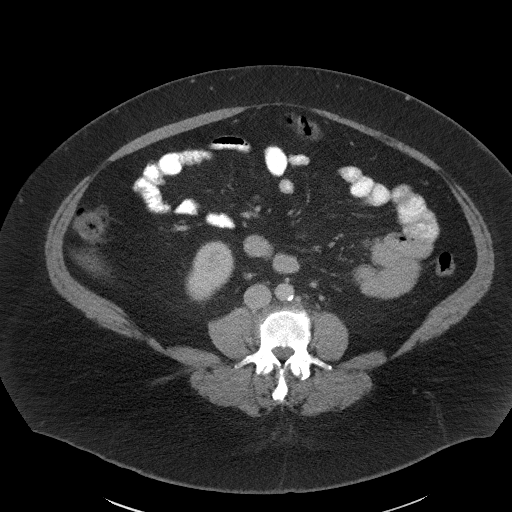
[im 54/94  soft-tissue]
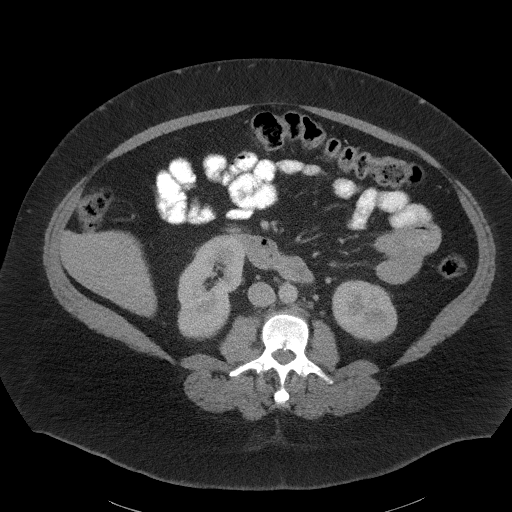
[im 54/94  bone]
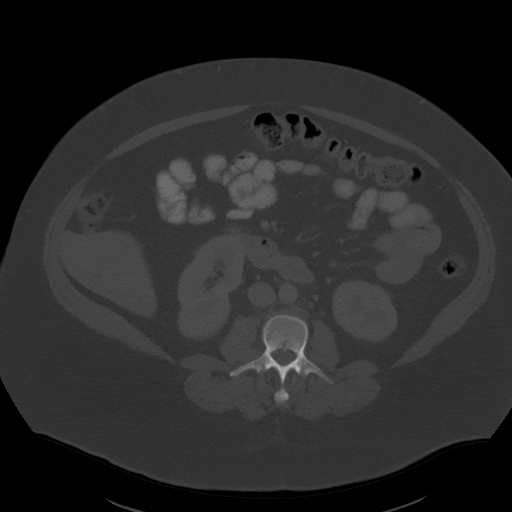
[im 64/94  soft-tissue]
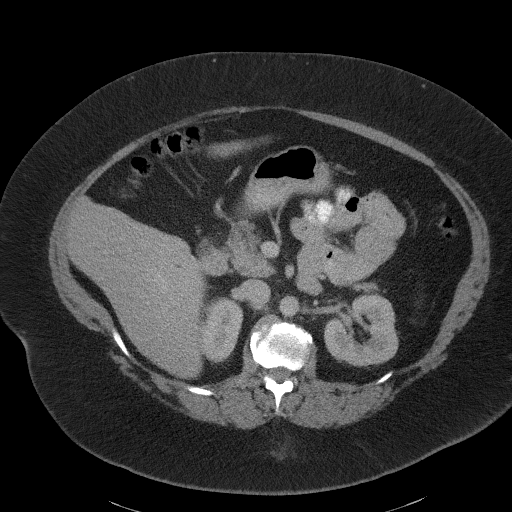
[im 69/94  soft-tissue]
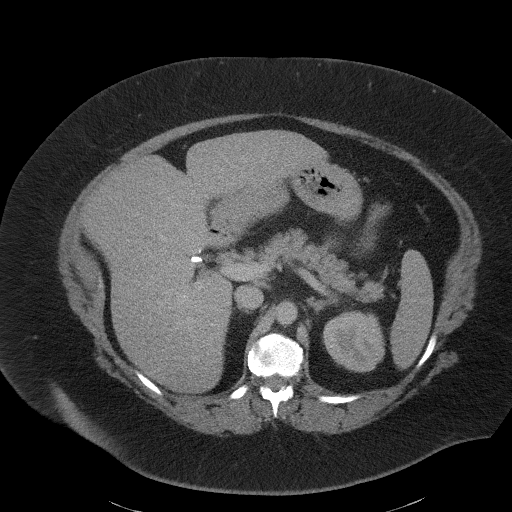
[im 74/94  soft-tissue]
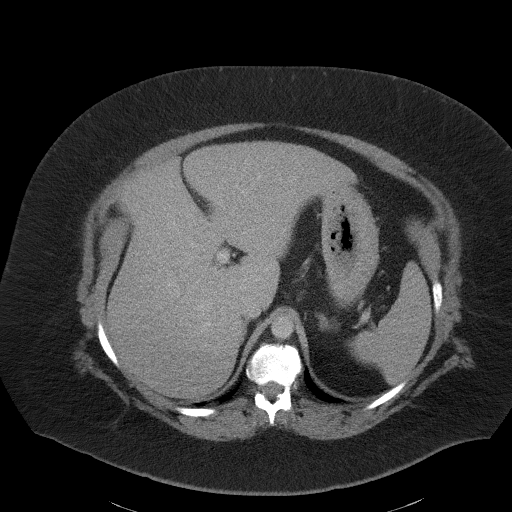
[im 84/94  soft-tissue]
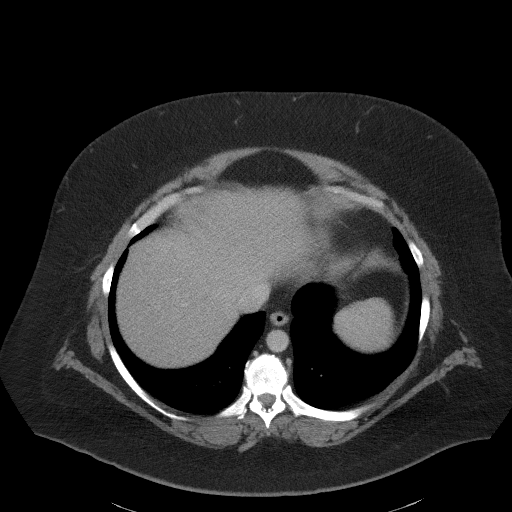
[im 89/94  soft-tissue]
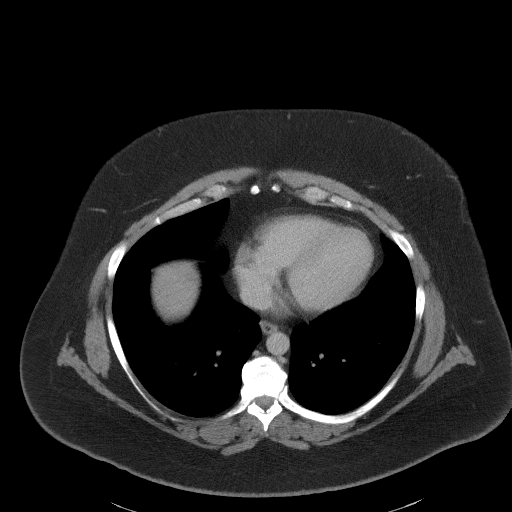

[Series 5: coronal st · coronal · 0.95mm/px · 3 of 126 slices shown]
[im 42/126  soft-tissue]
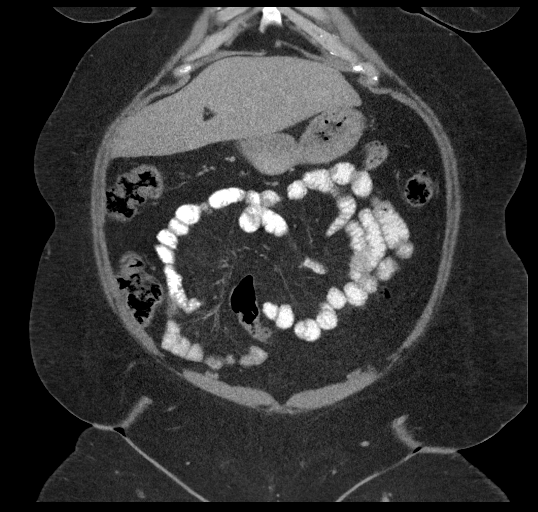
[im 56/126  soft-tissue]
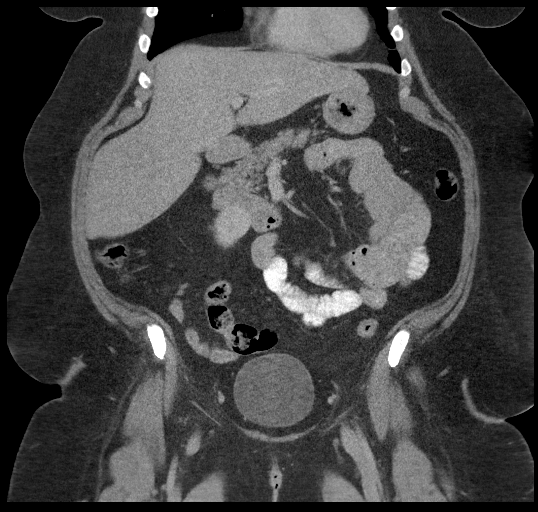
[im 70/126  soft-tissue]
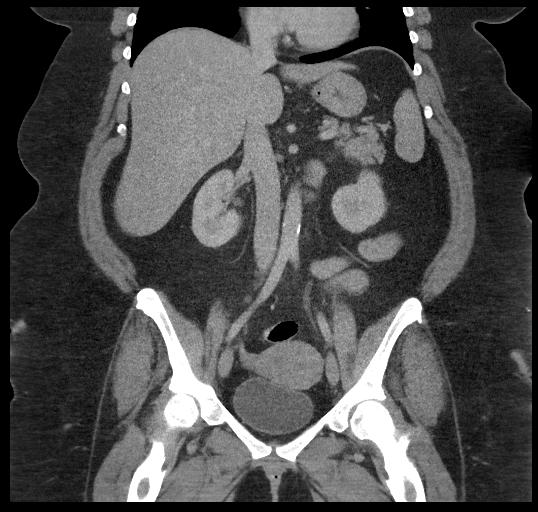

[17 of 46 positions shown; findings below may reference images not displayed]

FINDINGS: Lower chest: No acute abnormality.

Hepatobiliary: Mild hepatomegaly. No focal liver abnormality. Status
post cholecystectomy. No biliary dilatation.

Pancreas: Unremarkable. No pancreatic ductal dilatation or
surrounding inflammatory changes.

Spleen: Normal in size without focal abnormality.

Adrenals/Urinary Tract: Adrenal glands are unremarkable. Kidneys are
normal, without renal calculi, focal lesion, or hydronephrosis.
Bladder is unremarkable.

Stomach/Bowel: Stomach is within normal limits. Appendix appears
normal. No evidence of bowel wall thickening, distention, or
inflammatory changes.

Vascular/Lymphatic: Mild aortic atherosclerosis. Retroaortic left
renal vein. No enlarged abdominal or pelvic lymph nodes.

Reproductive: Uterus and bilateral adnexa are unremarkable.

Other: Small fat containing umbilical hernia. No free fluid or
pneumoperitoneum.

Musculoskeletal: No acute or significant osseous findings.
IMPRESSION: 1.  No acute intra-abdominal process.  Normal appendix.
2. Mild aortic atherosclerosis ([id number]).

These results will be called to the ordering clinician or
representative by the [HOSPITAL] at the imaging location.

## 2019-09-03 ENCOUNTER — Other Ambulatory Visit: Payer: Self-pay

## 2019-09-03 ENCOUNTER — Emergency Department (HOSPITAL_BASED_OUTPATIENT_CLINIC_OR_DEPARTMENT_OTHER): Payer: Self-pay

## 2019-09-03 ENCOUNTER — Encounter (HOSPITAL_BASED_OUTPATIENT_CLINIC_OR_DEPARTMENT_OTHER): Payer: Self-pay | Admitting: *Deleted

## 2019-09-03 ENCOUNTER — Emergency Department (HOSPITAL_BASED_OUTPATIENT_CLINIC_OR_DEPARTMENT_OTHER)
Admission: EM | Admit: 2019-09-03 | Discharge: 2019-09-03 | Disposition: A | Discharge status: Home / Self Care | Payer: Self-pay | Attending: Emergency Medicine | Admitting: Emergency Medicine

## 2019-09-03 DIAGNOSIS — R0600 Dyspnea, unspecified: Secondary | ICD-10-CM

## 2019-09-03 DIAGNOSIS — Z888 Allergy status to other drugs, medicaments and biological substances status: Secondary | ICD-10-CM | POA: Insufficient documentation

## 2019-09-03 DIAGNOSIS — N189 Chronic kidney disease, unspecified: Secondary | ICD-10-CM | POA: Insufficient documentation

## 2019-09-03 DIAGNOSIS — E782 Mixed hyperlipidemia: Secondary | ICD-10-CM | POA: Insufficient documentation

## 2019-09-03 DIAGNOSIS — Z794 Long term (current) use of insulin: Secondary | ICD-10-CM | POA: Insufficient documentation

## 2019-09-03 DIAGNOSIS — Z881 Allergy status to other antibiotic agents status: Secondary | ICD-10-CM | POA: Insufficient documentation

## 2019-09-03 DIAGNOSIS — R0609 Other forms of dyspnea: Secondary | ICD-10-CM | POA: Insufficient documentation

## 2019-09-03 DIAGNOSIS — E119 Type 2 diabetes mellitus without complications: Secondary | ICD-10-CM | POA: Insufficient documentation

## 2019-09-03 DIAGNOSIS — Z886 Allergy status to analgesic agent status: Secondary | ICD-10-CM | POA: Insufficient documentation

## 2019-09-03 DIAGNOSIS — Z79899 Other long term (current) drug therapy: Secondary | ICD-10-CM | POA: Insufficient documentation

## 2019-09-03 DIAGNOSIS — F17210 Nicotine dependence, cigarettes, uncomplicated: Secondary | ICD-10-CM | POA: Insufficient documentation

## 2019-09-03 DIAGNOSIS — R0602 Shortness of breath: Secondary | ICD-10-CM | POA: Insufficient documentation

## 2019-09-03 LAB — CBC WITH DIFFERENTIAL/PLATELET
Abs Immature Granulocytes: 0.06 10*3/uL (ref 0.00–0.07)
Basophils Absolute: 0.1 10*3/uL (ref 0.0–0.1)
Basophils Relative: 1 %
Eosinophils Absolute: 0.2 10*3/uL (ref 0.0–0.5)
Eosinophils Relative: 2 %
HCT: 41.2 % (ref 36.0–46.0)
Hemoglobin: 13.9 g/dL (ref 12.0–15.0)
Immature Granulocytes: 1 %
Lymphocytes Relative: 31 %
Lymphs Abs: 3.6 10*3/uL (ref 0.7–4.0)
MCH: 31.2 pg (ref 26.0–34.0)
MCHC: 33.7 g/dL (ref 30.0–36.0)
MCV: 92.6 fL (ref 80.0–100.0)
Monocytes Absolute: 0.6 10*3/uL (ref 0.1–1.0)
Monocytes Relative: 5 %
Neutro Abs: 7.2 10*3/uL (ref 1.7–7.7)
Neutrophils Relative %: 60 %
Platelets: 283 10*3/uL (ref 150–400)
RBC: 4.45 MIL/uL (ref 3.87–5.11)
RDW: 14.2 % (ref 11.5–15.5)
WBC: 11.6 10*3/uL — ABNORMAL HIGH (ref 4.0–10.5)
nRBC: 0 % (ref 0.0–0.2)

## 2019-09-03 LAB — COMPREHENSIVE METABOLIC PANEL
ALT: 34 U/L (ref 0–44)
AST: 19 U/L (ref 15–41)
Albumin: 3.5 g/dL (ref 3.5–5.0)
Alkaline Phosphatase: 65 U/L (ref 38–126)
Anion gap: 9 (ref 5–15)
BUN: 13 mg/dL (ref 6–20)
CO2: 22 mmol/L (ref 22–32)
Calcium: 8.9 mg/dL (ref 8.9–10.3)
Chloride: 108 mmol/L (ref 98–111)
Creatinine, Ser: 0.7 mg/dL (ref 0.44–1.00)
GFR calc Af Amer: >60 mL/min (ref >60)
GFR calc non Af Amer: >60 mL/min (ref >60)
Glucose, Bld: 128 mg/dL — ABNORMAL HIGH (ref 70–99)
Potassium: 3.7 mmol/L (ref 3.5–5.1)
Sodium: 139 mmol/L (ref 135–145)
Total Bilirubin: 0.4 mg/dL (ref 0.3–1.2)
Total Protein: 6.7 g/dL (ref 6.5–8.1)

## 2019-09-03 LAB — TROPONIN I (HIGH SENSITIVITY): Troponin I (High Sensitivity): 2 ng/L (ref <18)

## 2019-09-03 LAB — BRAIN NATRIURETIC PEPTIDE: B Natriuretic Peptide: 13.3 pg/mL (ref 0.0–100.0)

## 2019-09-03 MED ORDER — IOHEXOL 350 MG/ML SOLN
100.0000 mL | Freq: Once | INTRAVENOUS | Status: AC | PRN
  Administered 2019-09-03: 100 mL via INTRAVENOUS

## 2019-09-03 NOTE — ED Provider Notes (Signed)
[facility] EMERGENCY DEPARTMENT Provider Note   CSN: [id number] Arrival date & time: 09/03/19  1015     History Chief Complaint  Patient presents with  . Shortness of Breath    Kelsey Vang is a 44 y.o. female.  HPI     44 year old female comes in a chief complaint of shortness of breath. Patient has history of diabetes, hyperlipidemia and is post COVID-46 from February. Subsequently she has had bacterial pneumonia.  Patient reports that she has been noticing increasing shortness of breath the last few days. She also checked her BP and it was low. Last time she had the symptoms they found bacterial pneumonia. Patient denies any cough, fevers, chills. She is also reporting associated dizziness and at times she has felt like she might pass out. She has no chest pain associated with the symptoms. Patient continues to smoke, but has cut down dramatically. She denies any heavy alcohol use, substance abuse, back pain, abdominal pain.  Past Medical History:  Diagnosis Date  . Diabetes mellitus without complication ([place])   . Hyperlipemia   . Sinusitis, chronic 01/17/2019    Patient Active Problem List   Diagnosis Date Noted  . Sinusitis, chronic 01/17/2019  . Other fatigue 10/02/2018  . Depression 10/02/2018  . Myalgia 10/02/2018  . Upper respiratory infection with cough and congestion 08/28/2018  . Tachycardia 01/04/2017  . Palpitations 01/04/2017  . Obstructive apnea 04/23/2015  . Type 2 diabetes mellitus with diabetic polyneuropathy, with long-term current use of insulin ([place]) 12/27/2013  . Current tobacco use 12/27/2013  . Morbid (severe) obesity due to excess calories ([place]) 12/27/2013  . HLD (hyperlipidemia) 12/27/2013    Past Surgical History:  Procedure Laterality Date  . CHOLECYSTECTOMY    . ENDOMETRIAL ABLATION    . GALLBLADDER SURGERY    . TUBAL LIGATION       OB History   No obstetric history on file.     Family History  Problem Relation  Age of Onset  . Cancer Mother   . Diabetes Father   . Liver disease Father   . Heart attack Maternal Grandmother   . Cancer Paternal Grandfather     Social History   Tobacco Use  . Smoking status: Current Every Day Smoker    Packs/day: 0.50    Years: 20.00    Pack years: 10.00    Types: Cigarettes  . Smokeless tobacco: Never Used  Substance Use Topics  . Alcohol use: No  . Drug use: No    Home Medications Prior to Admission medications   Medication Sig Start Date End Date Taking? Authorizing Provider  Dulaglutide (TRULICITY) 1.5 YW/7.3XT SOPN INJECT 1 PEN SUBCUTANEOUSLY ONCE A WEEK 05/27/19  Yes [provider]  sitaGLIPtin (JANUVIA) 100 MG tablet Take by mouth. 02/22/19  Yes [provider]  ACCU-CHEK FASTCLIX LANCETS MISC Use to check sugars 2-3 times daily 04/17/17   Philemon Kingdom, MD  amoxicillin-clavulanate (AUGMENTIN) 875-125 MG tablet Take 1 tablet by mouth 2 (two) times daily. 01/17/19   Wendall Mola, NP  cetirizine (ZYRTEC) 10 MG tablet Take 10 mg by mouth daily.    [provider]  Cholecalciferol (VITAMIN D) 2000 units CAPS Take 1 capsule by mouth daily.    [provider]  clindamycin (CLEOCIN) 300 MG capsule Take 1 capsule (300 mg total) by mouth 3 (three) times daily. Patient not taking: Reported on 01/17/2019 12/16/18   Chevis Pretty, FNP  Coenzyme Q10 (CO Q 10) 100 MG CAPS  Take 1 capsule by mouth daily.    [provider]  diclofenac sodium (VOLTAREN) 1 % GEL Apply 2 g topically 4 (four) times daily. 04/03/17   Doristine Bosworth, MD  fluticasone (FLONASE) 50 MCG/ACT nasal spray Place 2 sprays into both nostrils daily. 07/15/18   Daphine Deutscher, Mary-Margaret, FNP  glucose blood (ACCU-CHEK GUIDE) test strip Use as instructed 04/17/17   Carlus Pavlov, MD  insulin glargine (LANTUS) 100 UNIT/ML injection Inject 0.3 mLs (30 Units total) into the skin at bedtime. Pens please. 01/26/18   Carlus Pavlov, MD  insulin  lispro (HUMALOG KWIKPEN) 100 UNIT/ML KwikPen INJECT 12 TO 15 UNITS INTO THE SKIN FOR A MEAL AS DIRECTED 04/09/18   Carlus Pavlov, MD  insulin lispro (HUMALOG) 100 UNIT/ML injection Inject under skin 5-15 before the 3 meals. Pens please. 01/26/18   Carlus Pavlov, MD  LANTUS SOLOSTAR 100 UNIT/ML Solostar Pen INECT 34 UNITS INTO THE SKIN AT BEDTIME 03/07/18   Carlus Pavlov, MD  lisinopril (ZESTRIL) 5 MG tablet Take 1 tablet (5 mg total) by mouth daily. 11/09/18   Corum, Minerva Fester, MD  magnesium oxide (MAG-OX) 400 MG tablet Take 400 mg by mouth daily.    [provider]  medroxyPROGESTERone Acetate 150 MG/ML SUSY INJECT 1 ML INTRAMUSCULARLY ONCE EVERY 3 MONTHS. 01/15/18   [provider]  metFORMIN (GLUCOPHAGE-XR) 500 MG 24 hr tablet Take 2 tablets (1,000 mg total) by mouth 2 (two) times daily with a meal. Patient not taking: Reported on 11/14/2018 05/26/17   Carlus Pavlov, MD  metoprolol succinate (TOPROL-XL) 25 MG 24 hr tablet TAKE 1 TABLET BY MOUTH ONCE DAILY. PLEASE SCHEDULE AN APPOINTMENT FOR FUTURE REFILLS. 09/17/18   Chilton Si, MD  naproxen (NAPROSYN) 500 MG tablet Take 1 tablet (500 mg total) by mouth 2 (two) times daily. 12/07/17   Daphine Deutscher, Mary-Margaret, FNP  nitrofurantoin, macrocrystal-monohydrate, (MACROBID) 100 MG capsule Take 1 capsule (100 mg total) by mouth 2 (two) times daily. 12/04/18   Eulis Foster, FNP  nortriptyline (PAMELOR) 10 MG capsule Take 1 capsule by mouth at bedtime 01/25/19   Drema Dallas, DO  Omega-3 Fatty Acids (FISH OIL) 1000 MG CAPS Take 1 capsule by mouth 2 (two) times daily.    [provider]  ondansetron (ZOFRAN) 4 MG tablet Take 1 tablet (4 mg total) by mouth every 8 (eight) hours as needed for nausea or vomiting. 01/18/19   Royal Hawthorn, NP  pantoprazole (PROTONIX) 40 MG tablet TAKE 1 TABLET BY MOUTH TWICE A DAY 04/03/18   Collie Siad A, MD  QUEtiapine (SEROQUEL) 100 MG tablet Take half tablet for one week then  increase to one tablet by mouth at bedtime 11/14/18   Collie Siad A, MD  sertraline (ZOLOFT) 100 MG tablet Take 1 tablet by mouth once daily 02/02/19   Doristine Bosworth, MD  SUMAtriptan (IMITREX) 100 MG tablet Take 1 tablet at earliest onset of migraine.  May repeat once in 2 hours if headache persists or recurs. 06/30/17   Drema Dallas, DO  tizanidine (ZANAFLEX) 2 MG capsule Take 1 capsule (2 mg total) by mouth 3 (three) times daily. 02/28/18   Drema Dallas, DO  tiZANidine (ZANAFLEX) 2 MG tablet TAKE 1 TABLET BY MOUTH THREE TIMES DAILY 12/17/18   Everlena Cooper, Adam R, DO  UNIFINE PENTIPS 32G X 4 MM MISC USE TO INJECT INSULIN 4 TIMES DAILY 02/02/18   Carlus Pavlov, MD    Allergies    Gabapentin, Levemir [insulin detemir], and  Keflex [cephalexin]  Review of Systems   Review of Systems  Constitutional: Positive for activity change.  Respiratory: Positive for shortness of breath.   Cardiovascular: Negative for chest pain.  Gastrointestinal: Negative for nausea and vomiting.  All other systems reviewed and are negative.   Physical Exam Updated Vital Signs BP 107/69   Pulse 90   Temp 98.8 F (37.1 C) (Oral)   Resp 17   Ht 5\' 2"  (1.575 m)   Wt 108.9 kg   SpO2 100%   BMI 43.90 kg/m   Physical Exam Vitals and nursing note reviewed.  Constitutional:      Appearance: She is well-developed.  HENT:     Head: Normocephalic and atraumatic.  Cardiovascular:     Rate and Rhythm: Normal rate.  Pulmonary:     Effort: Pulmonary effort is normal.     Breath sounds: No decreased breath sounds, wheezing, rhonchi or rales.  Abdominal:     General: Bowel sounds are normal.  Musculoskeletal:     Cervical back: Normal range of motion and neck supple.  Skin:    General: Skin is warm and dry.  Neurological:     Mental Status: She is alert and oriented to person, place, and time.     ED Results / Procedures / Treatments   Labs (all labs ordered are listed, but only abnormal results are  displayed) Labs Reviewed  COMPREHENSIVE METABOLIC PANEL - Abnormal; Notable for the following components:      Result Value   Glucose, Bld 128 (*)    All other components within normal limits  CBC WITH DIFFERENTIAL/PLATELET - Abnormal; Notable for the following components:   WBC 11.6 (*)    All other components within normal limits  BRAIN NATRIURETIC PEPTIDE  TROPONIN I (HIGH SENSITIVITY)    EKG EKG Interpretation  Date/Time:  Tuesday Sep 03 2019 11:25:40 EDT Ventricular Rate:  86 PR Interval:    QRS Duration: 88 QT Interval:  360 QTC Calculation: 431 R Axis:   69 Text Interpretation: Sinus rhythm Low voltage, precordial leads No acute changes No significant change since last tracing Confirmed by 08-08-1999 [phone]) on 09/03/2019 11:41:04 AM   Radiology CT Angio Chest PE W and/or Wo Contrast  Result Date: 09/03/2019 CLINICAL DATA:  Shortness of breath EXAM: CT ANGIOGRAPHY CHEST WITH CONTRAST TECHNIQUE: Multidetector CT imaging of the chest was performed using the standard protocol during bolus administration of intravenous contrast. Multiplanar CT image reconstructions and MIPs were obtained to evaluate the vascular anatomy. CONTRAST:  09/05/2019 OMNIPAQUE IOHEXOL 350 MG/ML SOLN COMPARISON:  June 20, 2019 chest CT; chest radiograph Sep 03, 2019 FINDINGS: Cardiovascular: No demonstrable pulmonary embolus. No thoracic aortic aneurysm or dissection. The visualized great vessels appear unremarkable except for minimal calcification in the proximal left subclavian artery. There are occasional foci of coronary artery calcification. No pericardial effusion or pericardial thickening. Mediastinum/Nodes: Visualized thyroid appears normal. There are subcentimeter mediastinal and axillary lymph nodes without adenopathy in the thoracic region by size criteria. No esophageal lesions are evident. Lungs/Pleura: Areas of patchy airspace opacity noted on previous CT examination have cleared. Currently there  is no appreciable edema or airspace opacity. No pleural effusions are evident. Upper Abdomen: There are occasional small calcified granulomas in the spleen. Gallbladder absent. Visualized upper abdominal structures otherwise appear normal. Musculoskeletal: There are foci of degenerative change in the thoracic spine. No blastic or lytic bone lesions. No evident chest wall lesions. Review of the MIP images confirms the above findings. IMPRESSION:  1. No demonstrable pulmonary embolus. No thoracic aortic aneurysm or dissection. Occasional foci of coronary artery calcification noted. 2.  Lungs now clear. 3.  No adenopathy by size criteria. 4.  Evidence of calcified splenic granulomas. 5.  Gallbladder absent. Electronically Signed   By: Bretta Bang III M.D.   On: 09/03/2019 12:27   DG Chest Port 1 View  Result Date: 09/03/2019 CLINICAL DATA:  Short of breath EXAM: PORTABLE CHEST 1 VIEW COMPARISON:  06/28/2019 FINDINGS: The heart size and mediastinal contours are within normal limits. Both lungs are clear. The visualized skeletal structures are unremarkable. IMPRESSION: No active disease. Electronically Signed   By: Marlan Palau M.D.   On: 09/03/2019 12:03    Procedures Procedures (including critical care time)  Medications Ordered in ED Medications  iohexol (OMNIPAQUE) 350 MG/ML injection 100 mL (100 mLs Intravenous Contrast Given 09/03/19 1211)    ED Course  I have reviewed the triage vital signs and the nursing notes.  Pertinent labs & imaging results that were available during my care of the patient were reviewed by me and considered in my medical decision making (see chart for details).    MDM Rules/Calculators/A&P                      44 year old comes in a chief complaint of shortness of breath. She is post COVID-19, and related complications like pneumonia.  Patient does not have any underlying lung disease, cardiac disease. She has been having worsening exertional shortness of  breath and events of dizziness with near fainting.  Her EKG is overall reassuring. Differential diagnosis does include PE, myopericarditis, severe anemia.  Appropriate labs ordered. CT PE ordered.  3:52 PM Initial troponin, BNP are normal. Patient is here score is less than three. I think from ACS perspective, we feel comfortable ruling out right now. CT PE was ordered and it is also negative.  It is unclear why patient is having shortness of breath with exertion. This could be possibly angina equivalent or just deconditioning from her Covid-19. Lung exam however continued to be pretty unremarkable.  Patient will be provided with PCP follow-up. She can call cardiology if she is unable to get into PCP to see if she needs a stress test. I think more importantly she will benefit with pulmonary follow-up and that information has been provided.   Final Clinical Impression(s) / ED Diagnoses Final diagnoses:  Shortness of breath  Dyspnea, unspecified type    Rx / DC Orders ED Discharge Orders    None       Derwood Kaplan, MD 09/03/19 1553

## 2019-09-03 NOTE — ED Notes (Signed)
Ambulated in room, SpO2 98-100%, HR 105-117, patient endorses some dizziness while ambulating.

## 2019-09-03 NOTE — ED Notes (Signed)
Patient transported to CT 

## 2019-09-03 NOTE — ED Notes (Signed)
ED Provider at bedside. 

## 2019-09-03 NOTE — ED Notes (Signed)
Pt on monitor 

## 2019-09-03 NOTE — ED Notes (Signed)
Pharmacy and medications updated with patient 

## 2019-09-03 NOTE — ED Notes (Signed)
Pt ambulatory with steady gait 

## 2019-09-03 NOTE — ED Notes (Signed)
This RN went to d/c pt, pt left and checked out with registration without receiving d/c paperwork.

## 2019-09-03 NOTE — ED Triage Notes (Signed)
Covid positive in Feb/ March, states she is experiencing SOB again and having HA

## 2019-09-03 NOTE — Discharge Instructions (Addendum)
We saw in the ER for shortness of breath. As discussed, the work-up in the ER is reassuring.  No signs of blood clot, pneumonia.  We would like you to follow-up with your primary care doctor and/or cardiology to see if you need a stress test. Additionally, some patients post Covid will have lung scarring that require pulmonary doctors to further assess and evaluate.  Consider following up with the pulmonologist as well for optimal care.  Return to the ER if your symptoms get worse or you start having fainting spells, severe chest pain.

## 2019-09-20 DIAGNOSIS — K3184 Gastroparesis: Secondary | ICD-10-CM | POA: Insufficient documentation

## 2020-01-21 DIAGNOSIS — Z86010 Personal history of colonic polyps: Secondary | ICD-10-CM | POA: Insufficient documentation

## 2020-01-21 DIAGNOSIS — K5900 Constipation, unspecified: Secondary | ICD-10-CM | POA: Insufficient documentation

## 2020-05-07 ENCOUNTER — Ambulatory Visit
Admission: EM | Admit: 2020-05-07 | Discharge: 2020-05-07 | Disposition: A | Discharge status: Home / Self Care | Payer: 59

## 2020-05-07 ENCOUNTER — Other Ambulatory Visit: Payer: Self-pay

## 2020-05-07 DIAGNOSIS — M5442 Lumbago with sciatica, left side: Secondary | ICD-10-CM

## 2020-05-07 HISTORY — DX: Essential (primary) hypertension: I10

## 2020-05-07 HISTORY — DX: Other chronic pain: G89.29

## 2020-05-07 MED ORDER — METHYLPREDNISOLONE SODIUM SUCC 125 MG IJ SOLR
125.0000 mg | Freq: Once | INTRAMUSCULAR | Status: AC
  Administered 2020-05-07: 125 mg via INTRAMUSCULAR

## 2020-05-07 MED ORDER — CYCLOBENZAPRINE HCL 5 MG PO TABS: 5.0000 mg | ORAL_TABLET | Freq: Two times a day (BID) | ORAL | 0 refills | Status: AC | PRN

## 2020-05-07 MED ORDER — NAPROXEN 500 MG PO TABS
500.0000 mg | ORAL_TABLET | Freq: Two times a day (BID) | ORAL | 0 refills | Status: DC
Start: 2020-05-07 — End: 2020-06-26

## 2020-05-07 NOTE — ED Triage Notes (Signed)
Pt c/o lower back pain radiating down lt leg x3wk and since yesterday pain is going down to foot. Denies injuries. States did heat/ice, stretches, and OTC meds with no relief.

## 2020-05-07 NOTE — Discharge Instructions (Signed)

## 2020-05-07 NOTE — ED Provider Notes (Signed)
[facility]    CSN: [id number] Arrival date & time: 05/07/20  5456      History   Chief Complaint Chief Complaint  Patient presents with  . Back Pain    HPI Kelsey Vang is a 45 y.o. female  With history as below, including chronic back pain with sciatica flares presenting for sciatic flare.  States that her left side has been hurting for the last few weeks, with pain radiating down her leg.  States over the last few days is gone down to her foot.  Denies weakness, saddle anesthesia, urinary retention or fecal incontinence.  No injury.  Has tried supportive measures without relief.  Questing steroid shot which has resolved this in the past.  Past Medical History:  Diagnosis Date  . Chronic back pain   . Diabetes mellitus without complication (HCC)   . Hyperlipemia   . Hypertension   . Sinusitis, chronic 01/17/2019    Patient Active Problem List   Diagnosis Date Noted  . Sinusitis, chronic 01/17/2019  . Other fatigue 10/02/2018  . Depression 10/02/2018  . Myalgia 10/02/2018  . Upper respiratory infection with cough and congestion 08/28/2018  . Tachycardia 01/04/2017  . Palpitations 01/04/2017  . Obstructive apnea 04/23/2015  . Type 2 diabetes mellitus with diabetic polyneuropathy, with long-term current use of insulin (HCC) 12/27/2013  . Current tobacco use 12/27/2013  . Morbid (severe) obesity due to excess calories (HCC) 12/27/2013  . HLD (hyperlipidemia) 12/27/2013    Past Surgical History:  Procedure Laterality Date  . CHOLECYSTECTOMY    . ENDOMETRIAL ABLATION    . GALLBLADDER SURGERY    . TUBAL LIGATION      OB History   No obstetric history on file.      Home Medications    Prior to Admission medications   Medication Sig Start Date End Date Taking? Authorizing Provider  busPIRone (BUSPAR) 15 MG tablet Take 15 mg by mouth 2 (two) times daily.   Yes [provider]  cyclobenzaprine (FLEXERIL) 5 MG tablet Take 1 tablet (5 mg  total) by mouth 2 (two) times daily as needed for up to 7 days for muscle spasms. 05/07/20 05/14/20 Yes Hall-Potvin, [place], PA-C  escitalopram (LEXAPRO) 10 MG tablet Take 10 mg by mouth daily.   Yes [provider]  glipiZIDE (GLUCOTROL) 10 MG tablet Take 10 mg by mouth daily before breakfast.   Yes [provider]  naproxen (NAPROSYN) 500 MG tablet Take 1 tablet (500 mg total) by mouth 2 (two) times daily. 05/07/20  Yes Hall-Potvin, [place], PA-C  omeprazole (PRILOSEC) 40 MG capsule Take 40 mg by mouth daily.   Yes [provider]  ACCU-CHEK FASTCLIX LANCETS MISC Use to check sugars 2-3 times daily 04/17/17   Carlus Pavlov, MD  cetirizine (ZYRTEC) 10 MG tablet Take 10 mg by mouth daily.    [provider]  Cholecalciferol (VITAMIN D) 2000 units CAPS Take 1 capsule by mouth daily.    [provider]  Coenzyme Q10 (CO Q 10) 100 MG CAPS Take 1 capsule by mouth daily.    [provider]  diclofenac sodium (VOLTAREN) 1 % GEL Apply 2 g topically 4 (four) times daily. 04/03/17   Doristine Bosworth, MD  Dulaglutide (TRULICITY) 1.5 MG/0.5ML SOPN INJECT 1 PEN SUBCUTANEOUSLY ONCE A WEEK 05/27/19   [provider]  fluticasone (FLONASE) 50 MCG/ACT nasal spray Place 2 sprays into both nostrils daily. 07/15/18   Daphine Deutscher, Mary-Margaret, FNP  glucose blood (ACCU-CHEK  GUIDE) test strip Use as instructed 04/17/17   Carlus Pavlov, MD  insulin glargine (LANTUS) 100 UNIT/ML injection Inject 0.3 mLs (30 Units total) into the skin at bedtime. Pens please. 01/26/18   Carlus Pavlov, MD  insulin lispro (HUMALOG KWIKPEN) 100 UNIT/ML KwikPen INJECT 12 TO 15 UNITS INTO THE SKIN FOR A MEAL AS DIRECTED 04/09/18   Carlus Pavlov, MD  insulin lispro (HUMALOG) 100 UNIT/ML injection Inject under skin 5-15 before the 3 meals. Pens please. 01/26/18   Carlus Pavlov, MD  LANTUS SOLOSTAR 100 UNIT/ML Solostar Pen INECT 34 UNITS INTO THE SKIN AT BEDTIME 03/07/18    Carlus Pavlov, MD  magnesium oxide (MAG-OX) 400 MG tablet Take 400 mg by mouth daily.    [provider]  metoprolol succinate (TOPROL-XL) 25 MG 24 hr tablet TAKE 1 TABLET BY MOUTH ONCE DAILY. PLEASE SCHEDULE AN APPOINTMENT FOR FUTURE REFILLS. 09/17/18   Chilton Si, MD  nitrofurantoin, macrocrystal-monohydrate, (MACROBID) 100 MG capsule Take 1 capsule (100 mg total) by mouth 2 (two) times daily. 12/04/18   Eulis Foster, FNP  Omega-3 Fatty Acids (FISH OIL) 1000 MG CAPS Take 1 capsule by mouth 2 (two) times daily.    [provider]  ondansetron (ZOFRAN) 4 MG tablet Take 1 tablet (4 mg total) by mouth every 8 (eight) hours as needed for nausea or vomiting. 01/18/19   Royal Hawthorn, NP  QUEtiapine (SEROQUEL) 100 MG tablet Take half tablet for one week then increase to one tablet by mouth at bedtime 11/14/18   Collie Siad A, MD  sitaGLIPtin (JANUVIA) 100 MG tablet Take by mouth. 02/22/19   [provider]  SUMAtriptan (IMITREX) 100 MG tablet Take 1 tablet at earliest onset of migraine.  May repeat once in 2 hours if headache persists or recurs. 06/30/17   Everlena Cooper, Adam R, DO  UNIFINE PENTIPS 32G X 4 MM MISC USE TO INJECT INSULIN 4 TIMES DAILY 02/02/18   Carlus Pavlov, MD    Family History Family History  Problem Relation Age of Onset  . Cancer Mother   . Diabetes Father   . Liver disease Father   . Heart attack Maternal Grandmother   . Cancer Paternal Grandfather     Social History Social History   Tobacco Use  . Smoking status: Current Every Day Smoker    Packs/day: 0.50    Years: 20.00    Pack years: 10.00    Types: Cigarettes  . Smokeless tobacco: Never Used  Vaping Use  . Vaping Use: Former  Substance Use Topics  . Alcohol use: No  . Drug use: No     Allergies   Gabapentin, Levemir [insulin detemir], Keflex [cephalexin], and Prednisone   Review of Systems Review of Systems  Constitutional: Negative for fatigue and fever.   HENT: Negative for ear pain, sinus pain, sore throat and voice change.   Eyes: Negative for pain, redness and visual disturbance.  Respiratory: Negative for cough and shortness of breath.   Cardiovascular: Negative for chest pain and palpitations.  Gastrointestinal: Negative for abdominal pain, diarrhea and vomiting.  Musculoskeletal: Positive for back pain. Negative for arthralgias and myalgias.  Skin: Negative for rash and wound.  Neurological: Negative for syncope and headaches.     Physical Exam Triage Vital Signs ED Triage Vitals  Enc Vitals Group     BP 05/07/20 1138 139/85     Pulse Rate 05/07/20 1138 89     Resp 05/07/20 1138 18     Temp 05/07/20 1138  97.9 F (36.6 C)     Temp Source 05/07/20 1138 Oral     SpO2 05/07/20 1138 96 %     Weight --      Height --      Head Circumference --      Peak Flow --      Pain Score 05/07/20 1201 10     Pain Loc --      Pain Edu? --      Excl. in GC? --    No data found.  Updated Vital Signs BP 139/85   Pulse 89   Temp 97.9 F (36.6 C) (Oral)   Resp 18   SpO2 96%   Visual Acuity Right Eye Distance:   Left Eye Distance:   Bilateral Distance:    Right Eye Near:   Left Eye Near:    Bilateral Near:     Physical Exam Constitutional:      General: She is not in acute distress. HENT:     Head: Normocephalic and atraumatic.  Eyes:     General: No scleral icterus.    Pupils: Pupils are equal, round, and reactive to light.  Cardiovascular:     Rate and Rhythm: Normal rate.  Pulmonary:     Effort: Pulmonary effort is normal.  Musculoskeletal:     Comments: Diffuse left lumbar tenderness without bony deformity or crepitus.  Overall exam limited second patient habitus and cooperation due to pain.  Skin:    Coloration: Skin is not jaundiced or pale.  Neurological:     General: No focal deficit present.     Mental Status: She is alert and oriented to person, place, and time.      UC Treatments / Results   Labs (all labs ordered are listed, but only abnormal results are displayed) Labs Reviewed - No data to display  EKG   Radiology No results found.  Procedures Procedures (including critical care time)  Medications Ordered in UC Medications  methylPREDNISolone sodium succinate (SOLU-MEDROL) 125 mg/2 mL injection 125 mg (125 mg Intramuscular Given 05/07/20 1304)    Initial Impression / Assessment and Plan / UC Course  I have reviewed the triage vital signs and the nursing notes.  Pertinent labs & imaging results that were available during my care of the patient were reviewed by me and considered in my medical decision making (see chart for details).     H&P concerning for acute on chronic left low back pain with sciatica.  Given Solu-Medrol in office which she has tolerated well in the past: Tolerated well today.  Will treat supportively, follow-up with orthopedics and/or PCP as needed.  Return precautions discussed, pt verbalized understanding and is agreeable to plan. Final Clinical Impressions(s) / UC Diagnoses   Final diagnoses:  Acute left-sided low back pain with left-sided sciatica     Discharge Instructions     Heat therapy (hot compress, warm wash rag, hot showers, etc.) can help relax muscles and soothe muscle aches. Cold therapy (ice packs) can be used to help swelling both after injury and after prolonged use of areas of chronic pain/aches.  Pain medication:  500 mg Naprosyn/Aleve (naproxen) every 12 hours with food:  AVOID other NSAIDs while taking this (may have Tylenol).  May take muscle relaxer as needed for severe pain / spasm.  (This medication may cause you to become tired so it is important you do not drink alcohol or operate heavy machinery while on this medication.  Recommend your first dose  to be taken before bedtime to monitor for side effects safely)  Important to follow up with specialist(s) below for further evaluation/management if your symptoms  persist or worsen.    ED Prescriptions    Medication Sig Dispense Auth. Provider   naproxen (NAPROSYN) 500 MG tablet Take 1 tablet (500 mg total) by mouth 2 (two) times daily. 30 tablet Hall-Potvin, [place], PA-C   cyclobenzaprine (FLEXERIL) 5 MG tablet Take 1 tablet (5 mg total) by mouth 2 (two) times daily as needed for up to 7 days for muscle spasms. 14 tablet Hall-Potvin, [place], PA-C     PDMP not reviewed this encounter.   Hall-Potvin, [place], [place] 05/07/20 1354

## 2020-05-15 ENCOUNTER — Ambulatory Visit: Payer: Self-pay

## 2020-06-10 ENCOUNTER — Ambulatory Visit: Payer: 59 | Admitting: Family

## 2020-06-10 ENCOUNTER — Encounter: Payer: 59 | Admitting: Family

## 2020-06-10 NOTE — Progress Notes (Signed)
Patient did not show for appointment.   

## 2020-06-17 ENCOUNTER — Ambulatory Visit: Payer: 59 | Admitting: Legal Medicine

## 2020-06-26 ENCOUNTER — Encounter: Payer: Self-pay | Admitting: Internal Medicine

## 2020-06-26 ENCOUNTER — Ambulatory Visit: Payer: 59 | Admitting: Internal Medicine

## 2020-06-26 ENCOUNTER — Other Ambulatory Visit: Payer: Self-pay

## 2020-06-26 VITALS — BP 120/78 | HR 99 | Ht 62.0 in | Wt 271.4 lb

## 2020-06-26 DIAGNOSIS — E1142 Type 2 diabetes mellitus with diabetic polyneuropathy: Secondary | ICD-10-CM

## 2020-06-26 DIAGNOSIS — Z794 Long term (current) use of insulin: Secondary | ICD-10-CM | POA: Diagnosis not present

## 2020-06-26 DIAGNOSIS — E785 Hyperlipidemia, unspecified: Secondary | ICD-10-CM | POA: Diagnosis not present

## 2020-06-26 MED ORDER — INSULIN GLARGINE 100 UNIT/ML ~~LOC~~ SOLN: 50.0000 [IU] | Freq: Every day | SUBCUTANEOUS | 3 refills | Status: DC

## 2020-06-26 MED ORDER — FREESTYLE LIBRE 2 SENSOR MISC
1.0000 | 3 refills | Status: DC
Start: 2020-06-26 — End: 2021-10-15

## 2020-06-26 MED ORDER — FREESTYLE LIBRE 2 READER DEVI
1.0000 | Freq: Every day | 0 refills | Status: DC
Start: 2020-06-26 — End: 2021-11-08

## 2020-06-26 NOTE — Patient Instructions (Addendum)
Please continue: - Farxiga 5 mg before b'fast - Lantus 50 units at bedtime - NovoLog 5-15 units before each meal  Please start: - Ozempic 0.25 mg weekly in a.m. (for example on Sunday morning) x 4 weeks, then increase to 0.5 mg weekly in a.m. if no nausea or hypoglycemia.  If you cannot use Ozempic, then increase: - Lantus 15 units in am and 50 units at bedtime - NovoLog 20-25 units before meals (try to inject at the start of the meal)  Please return in 1.5 months with your sugar

## 2020-06-26 NOTE — Progress Notes (Addendum)
Patient ID: Kelsey Vang, female   DOB: 10/15/1975, 45 y.o.   MRN: [id number]   This visit occurred during the SARS-CoV-2 public health emergency.  Safety protocols were in place, including screening questions prior to the visit, additional usage of staff PPE, and extensive cleaning of exam room while observing appropriate contact time as indicated for disinfecting solutions.   HPI: Kelsey Vang is a 45 y.o.-year-old female, returning for f/u for DM2, dx in 2010, insulin-dependent since 01/2016, uncontrolled, with long term complications (gastroparesis, peripheral neuropathy). She is the wife of Kelsey Vang, who is also my pt. Last visit 2 years and 5 months ago.  Before last visit, she started to change her diet (smaller portions), eating healthier snacks and started to exercise.  She lost 15 pounds before this visit.  However, she was lost for follow-up afterwards.  She is now referred back to endocrinology by PCP, as her diabetes worsened after steroid inj's (im) >> will start epidral inj's.  Apparently, he developed gastroparesis in 04/2019 and peripheral neuropathy.  Review latest HbA1c levels: 06/18/2020: HbA1c 9.6% previously 9.8% Lab Results  Component Value Date   HGBA1C 6.8 (A) 01/26/2018   HGBA1C 7.9 08/25/2017   HGBA1C 8.5 05/26/2017  02/26/2016: HbA1c 9.2%. Surprisingly, the fructosamine level checked at the same time was 271 (corresponding to the calculated  HbA1c of 6.2%, which, however, does not correlate with the sugars checked at home) 04/07/2015: HbA1c 7.1%  Pt was on a regimen of: - Metformin ER 1000 mg 2x a day (had N/D/AP with regular metformin). - Januvia 100 mg before b'fast (but has a h/o gall stones pancreatitis in 1995 and had flares afterwards, despite cholecystectomy) - VGo 30 >> 40:  - smaller meal 4 >> 6 clicks - larger meal: 6 >> 8 clicks - snack: 1-2 clicks  [name] >> elevated Cr and Invokana >> yeast infection.  At last visit she was  on: - Metformin 500 mg 3-4x a day with meals >> 1000 mg 2x a day - Januvia 100 mg before b'fast. - Humalog 15-25  >> 5-8 units before meals - Lantus (20-)40 units at bedtime  She is currently on: - Farxiga 5 mg before breakfast - started 06/18/2020 - Novolog 5-8 >> 30 >> 5-15 units before meals - Lantus 40 >> 50 units at bedtime She now also mentions intolerance to Metformin, Invokana, Januvia, Trulicity >> AP/nausea. Glipizide stopped when [place] started.  Pt checks her sugars 3 times a day: - am:  170-210 >> 106-138 >> 72-128, 132 >> 132-228, 307 - 2h after b'fast: n/c >>  548 - before lunch: 100-151 >> n/c >> 68 >> n/c - 2h after lunch: n/c >> 123-164 >> n/c >> 118-180 >> 211-263, 370 - before dinner: 97, 136-169 >> 180-200 >> n/c - 2h after dinner:170-200s >> 148-201 >> 152-217, 273 >> 209-392 - bedtime:  106-196, 218, 225 >> same as above - nighttime: n/c >> 30, 59, 63 >> n/c Lowest sugar was 106 >> 30 (night) >> 50s, lately 132 Highest sugar was 201 >> 273 >> 548  Glucometer: True Metric >> AccuCheck Guide  Pt's meals are: - Breakfast: instant oatmeal or yoghurt + banana - Lunch: salad + meat  - Dinner: meat + 1-2 veggies, less bread - Snacks: trail mix  -No CKD, last BUN/creatinine:  06/18/2020: 12/0.84, GFR 88, glucose 479 Lab Results  Component Value Date   BUN 13 09/03/2019   BUN 9 10/02/2018   CREATININE 0.70 09/03/2019  CREATININE 0.74 10/02/2018  02/26/2016: Glucose 143, BUN/creatinine 22/0.63, EGFR 107 On lisinopril 5. -+ HL; last set of lipids: 06/18/2020: 334/522/53/176 Lab Results  Component Value Date   CHOL 304 (H) 09/09/2017   HDL 48 09/09/2017   LDLCALC 198 (H) 09/09/2017   TRIG 289 (H) 09/09/2017   CHOLHDL 6.3 (H) 09/09/2017   02/26/2016: 160/179/52/71 On ezetimibe and fish oil. Had leg cramps from Lipitor. - last eye exam was 08/2017: No DR - no numbness but has tingling in her legs.  She was on Lyrica for carpal tunnel hands and pain in the  feet from a previous injury.  She stopped this before her last visit and symptoms have not returned.  On B12.  She does have a history of cervical cancer, fibromyalgia, anxiety/depression, low back pain -treated at [facility].  She is status post steroid injections and prednisone tapers.  On Lyrica and Flexeril.  She is on Depo-Provera.  ROS: Constitutional: no weight gain/no weight loss, + fatigue, no subjective hyperthermia, no subjective hypothermia, excessive urination, nocturia Eyes: no blurry vision, no xerophthalmia ENT: no sore throat, no nodules palpated in neck, no dysphagia, no odynophagia, no hoarseness Cardiovascular: no CP/no SOB/no palpitations/no leg swelling Respiratory: no cough/no SOB/no wheezing Gastrointestinal: + Nausea, + vomiting, + diarrhea, + constipation + acid reflux Musculoskeletal: + Muscle aches/+ joint aches Skin: no rashes, + hair loss Neurological: no tremors/no numbness/no tingling/no dizziness; + shooting pain in legs, + headaches + anxiety/+ depression  I reviewed pt's medications, allergies, PMH, social hx, family hx, and changes were documented in the history of present illness. Otherwise, unchanged from my initial visit note.  Past Medical History:  Diagnosis Date  . Anxiety    Phreesia 06/09/2020  . Chronic back pain   . Depression    Phreesia 06/09/2020  . Diabetes mellitus without complication ([place])   . GERD (gastroesophageal reflux disease)    Phreesia 06/09/2020  . Hyperlipemia   . Hyperlipidemia    Phreesia 06/09/2020  . Hypertension   . Sinusitis, chronic 01/17/2019  . Sleep apnea    Phreesia 06/09/2020   Past Surgical History:  Procedure Laterality Date  . CHOLECYSTECTOMY    . ENDOMETRIAL ABLATION    . GALLBLADDER SURGERY    . TUBAL LIGATION     Social History   Social History  . Marital status: Married    Spouse name: Kelsey Vang   . Number of children: 1   Occupational History  . CMA   Social History Main  Topics  . Smoking status: Current Every Day Smoker    Packs/day: 1.00    Types: Cigarettes  . Smokeless tobacco: Never Used  . Alcohol use No  . Drug use: No   Current Outpatient Medications on File Prior to Visit  Medication Sig Dispense Refill  . ACCU-CHEK FASTCLIX LANCETS MISC Use to check sugars 2-3 times daily 102 each 11  . busPIRone (BUSPAR) 15 MG tablet Take 15 mg by mouth 2 (two) times daily.    . cetirizine (ZYRTEC) 10 MG tablet Take 10 mg by mouth daily.    . Cholecalciferol (VITAMIN D) 2000 units CAPS Take 1 capsule by mouth daily.    . Coenzyme Q10 (CO Q 10) 100 MG CAPS Take 1 capsule by mouth daily.    . diclofenac sodium (VOLTAREN) 1 % GEL Apply 2 g topically 4 (four) times daily. 100 g 0  . Dulaglutide (TRULICITY) 1.5 NW/2.9FA SOPN INJECT 1 PEN SUBCUTANEOUSLY ONCE A WEEK    .  escitalopram (LEXAPRO) 10 MG tablet Take 10 mg by mouth daily.    . fluticasone (FLONASE) 50 MCG/ACT nasal spray Place 2 sprays into both nostrils daily. 16 g 6  . glipiZIDE (GLUCOTROL) 10 MG tablet Take 10 mg by mouth daily before breakfast.    . glucose blood (ACCU-CHEK GUIDE) test strip Use as instructed 100 each 12  . insulin glargine (LANTUS) 100 UNIT/ML injection Inject 0.3 mLs (30 Units total) into the skin at bedtime. Pens please. 45 mL 3  . insulin lispro (HUMALOG KWIKPEN) 100 UNIT/ML KwikPen INJECT 12 TO 15 UNITS INTO THE SKIN FOR A MEAL AS DIRECTED 45 mL 3  . insulin lispro (HUMALOG) 100 UNIT/ML injection Inject under skin 5-15 before the 3 meals. Pens please. 45 mL 3  . LANTUS SOLOSTAR 100 UNIT/ML Solostar Pen INECT 34 UNITS INTO THE SKIN AT BEDTIME 45 mL 3  . magnesium oxide (MAG-OX) 400 MG tablet Take 400 mg by mouth daily.    . metoprolol succinate (TOPROL-XL) 25 MG 24 hr tablet TAKE 1 TABLET BY MOUTH ONCE DAILY. PLEASE SCHEDULE AN APPOINTMENT FOR FUTURE REFILLS. 90 tablet 0  . naproxen (NAPROSYN) 500 MG tablet Take 1 tablet (500 mg total) by mouth 2 (two) times daily. 30 tablet 0  .  nitrofurantoin, macrocrystal-monohydrate, (MACROBID) 100 MG capsule Take 1 capsule (100 mg total) by mouth 2 (two) times daily. 10 capsule 0  . Omega-3 Fatty Acids (FISH OIL) 1000 MG CAPS Take 1 capsule by mouth 2 (two) times daily.    Marland Kitchen omeprazole (PRILOSEC) 40 MG capsule Take 40 mg by mouth daily.    . ondansetron (ZOFRAN) 4 MG tablet Take 1 tablet (4 mg total) by mouth every 8 (eight) hours as needed for nausea or vomiting. 20 tablet 0  . QUEtiapine (SEROQUEL) 100 MG tablet Take half tablet for one week then increase to one tablet by mouth at bedtime 30 tablet 3  . sitaGLIPtin (JANUVIA) 100 MG tablet Take by mouth.    . SUMAtriptan (IMITREX) 100 MG tablet Take 1 tablet at earliest onset of migraine.  May repeat once in 2 hours if headache persists or recurs. 10 tablet 2  . UNIFINE PENTIPS 32G X 4 MM MISC USE TO INJECT INSULIN 4 TIMES DAILY 400 each 5   No current facility-administered medications on file prior to visit.   Allergies  Allergen Reactions  . Other Diarrhea and Rash  . Gabapentin Other (See Comments)  . Levemir [Insulin Detemir] Dermatitis    Whelps at injection site  . Keflex [Cephalexin] Rash  . Prednisone Palpitations    oral   Family History  Problem Relation Age of Onset  . Cancer Mother   . Diabetes Father   . Liver disease Father   . Heart attack Maternal Grandmother   . Cancer Paternal Grandfather    PE: BP 120/78 (BP Location: Right Arm, Patient Position: Sitting, Cuff Size: Normal)   Pulse 99   Ht '5\' 2"'  (1.575 m)   Wt 271 lb 6.4 oz (123.1 kg)   SpO2 97%   BMI 49.64 kg/m  Wt Readings from Last 3 Encounters:  06/26/20 271 lb 6.4 oz (123.1 kg)  09/03/19 240 lb (108.9 kg)  11/14/18 252 lb (114.3 kg)   Constitutional: overweight, in NAD Eyes: PERRLA, EOMI, no exophthalmos ENT: moist mucous membranes, no thyromegaly, no cervical lymphadenopathy Cardiovascular: RRR, No MRG Respiratory: CTA B Gastrointestinal: abdomen soft, NT, ND, BS+ Musculoskeletal:  no deformities, strength intact in all 4 Skin: moist, warm,  no rashes Neurological: no tremor with outstretched hands, DTR normal in all 4  ASSESSMENT: 1. DM2, insulin-dependent, uncontrolled, without long term complications, but with hyper and hypoglycemia -History of pancreatitis -Gastroparesis -Peripheral neuropathy  2. HL  3.  Obesity class III   PLAN:  1. Patient with longstanding, uncontrolled, type 2 diabetes, on oral antidiabetic regimen and basal-bolus insulin regimen, returning after long absence of 2.5 years.  Her HbA1c was at goal at last visit, at 6.8%, but her diabetes worsened since then. - she was prev. On a patch pump (VGo) but this was coming off >> stopped. -At today's visit, sugars are very high, and she mentions that they started to increase when she started to get steroid injections for her back pain.  She is now preparing to switch to epidural injections.  Sugars remain high at all times of the day, increasing in a stepwise fashion, despite the fact that last injection was in 04/2020.   -Since last visit, she had several changes in her regimen: She is off Metformin and Januvia, she just added Farxiga, so far tolerated well, and she increase Lantus.  She is using now NovoLog instead of Humalog.  She injects this approximately 10 minutes before meals.  As mentioned above, her sugars are still very high, even with 1 value in the 500s. -At this visit, I suggested a freestyle libre CGM, which appears to be covered by her insurance.  I sent a prescription for this to her pharmacy.  She would be interested to get this. -I also again suggested to try a GLP-1 receptor agonist.  She has a remote history of pancreatitis due to cholecystitis, but this was 30 years ago.  She tried Trulicity but could not tolerate it due to GI symptoms, but we discussed that this is not the class effect.  She is interested in trying a low-dose of Ozempic.  Discussed about benefits and possible side  effects.  I did advise her how to start at a low dose and increase as tolerated.  I also gave her a backup plan, in case we cannot use Ozempic, to increase NovoLog before meals, inject at the start of the meal due to her history of gastroparesis, and also add a lower dose of Lantus in the morning. - I suggested to:  Patient Instructions  Please continue: - Farxiga 5 mg before b'fast - Lantus 50 units at bedtime - NovoLog 5-15 units before each meal  Please start: - Ozempic 0.25 mg weekly in a.m. (for example on Sunday morning) x 4 weeks, then increase to 0.5 mg weekly in a.m. if no nausea or hypoglycemia.  If you cannot use Ozempic, then increase: - Lantus 15 units in am and 50 units at bedtime - NovoLog 20-25 units before meals (try to inject at the start of the meal)  Please return in 1.5 months with your sugar  - advised to check sugars at different times of the day - 4x a day, rotating check times - advised for yearly eye exams >> she is not UTD - return to clinic in 1.5 months  2. HL -Reviewed latest lipid panel from 06/2020: Very high triglycerides and LDL-per review of latest labs from PCP -On Zetia 10, omega-3 fatty acids.  Previously on Lipitor 10 mg daily but off now due to intolerance. -Improving her diabetes will definitely help with improving her blood sugars.  3. Obesity class III -She lost 15 pounds before our last visit -since then, she lost 36 lbs! -  We will continue the SGLT2 inhibitor with planning to add a GLP-1 receptor agonist, which should further help with weight loss  Philemon Kingdom, MD PhD [facility] Endocr[facility]

## 2020-06-28 ENCOUNTER — Encounter: Payer: Self-pay | Admitting: Internal Medicine

## 2020-06-29 ENCOUNTER — Telehealth: Payer: Self-pay | Admitting: Internal Medicine

## 2020-06-29 NOTE — Telephone Encounter (Signed)
Patient called to find out if the Ozempic 0.25 was going to be sent to the [facility] for her

## 2020-06-30 ENCOUNTER — Other Ambulatory Visit: Payer: Self-pay | Admitting: Internal Medicine

## 2020-06-30 ENCOUNTER — Ambulatory Visit: Payer: 59 | Admitting: Internal Medicine

## 2020-06-30 ENCOUNTER — Encounter: Payer: Self-pay | Admitting: Internal Medicine

## 2020-06-30 MED ORDER — OZEMPIC (0.25 OR 0.5 MG/DOSE) 2 MG/1.5ML ~~LOC~~ SOPN
0.5000 mg | PEN_INJECTOR | SUBCUTANEOUS | 3 refills | Status: DC
Start: 2020-06-30 — End: 2020-08-06

## 2020-07-13 ENCOUNTER — Telehealth: Payer: Self-pay | Admitting: Gastroenterology

## 2020-07-13 NOTE — Telephone Encounter (Signed)
Hey Dr Russella Dar, this pt is wishing to establish care with [place] for Gastroparesis, but it looks like the pt saw [facility] on  01/2020, please advise if you'll agree to take her as a new pt

## 2020-07-13 NOTE — Telephone Encounter (Signed)
I am not able to accommodate a transfer of care.

## 2020-07-15 NOTE — Telephone Encounter (Signed)
Informed pt of Dr Ardell Isaacs recommendations, pt voiced understanding

## 2020-07-16 ENCOUNTER — Encounter: Payer: Self-pay | Admitting: Internal Medicine

## 2020-08-06 ENCOUNTER — Encounter: Payer: Self-pay | Admitting: Internal Medicine

## 2020-08-06 ENCOUNTER — Ambulatory Visit: Payer: 59 | Admitting: Internal Medicine

## 2020-08-06 ENCOUNTER — Other Ambulatory Visit: Payer: Self-pay

## 2020-08-06 VITALS — BP 110/72 | HR 104 | Ht 62.0 in | Wt 276.0 lb

## 2020-08-06 DIAGNOSIS — Z794 Long term (current) use of insulin: Secondary | ICD-10-CM

## 2020-08-06 DIAGNOSIS — E785 Hyperlipidemia, unspecified: Secondary | ICD-10-CM | POA: Diagnosis not present

## 2020-08-06 DIAGNOSIS — E1142 Type 2 diabetes mellitus with diabetic polyneuropathy: Secondary | ICD-10-CM | POA: Diagnosis not present

## 2020-08-06 MED ORDER — OZEMPIC (1 MG/DOSE) 4 MG/3ML ~~LOC~~ SOPN
1.0000 mg | PEN_INJECTOR | SUBCUTANEOUS | 3 refills | Status: DC
Start: 2020-08-06 — End: 2021-06-22

## 2020-08-06 NOTE — Progress Notes (Signed)
Patient ID: Kelsey Vang, female   DOB: 17-Jul-1975, 45 y.o.   MRN: [id number]   This visit occurred during the SARS-CoV-2 public health emergency.  Safety protocols were in place, including screening questions prior to the visit, additional usage of staff PPE, and extensive cleaning of exam room while observing appropriate contact time as indicated for disinfecting solutions.   HPI: Kelsey Vang is a 45 y.o.-year-old female, returning for f/u for DM2, dx in 2010, insulin-dependent since 01/2016, uncontrolled, with long term complications (gastroparesis, peripheral neuropathy). She is the wife of [name], who is also my pt. Last visit 1.5 mo ago.  Interim hx: She was able to start Ozempic since last OV (2 weeks ago)>> had a little nausea, now resolved.  She still has steroid epidural injections >> last 07/27/2020.  She may need to have back surgery.  She will find out soon.  Review latest HbA1c levels: 06/18/2020: HbA1c 9.6% previously 9.8% Lab Results  Component Value Date   HGBA1C 6.8 (A) 01/26/2018   HGBA1C 7.9 08/25/2017   HGBA1C 8.5 05/26/2017  02/26/2016: HbA1c 9.2%. Surprisingly, the fructosamine level checked at the same time was 271 (corresponding to the calculated  HbA1c of 6.2%, which, however, does not correlate with the sugars checked at home) 04/07/2015: HbA1c 7.1%  Pt was on a regimen of: - Metformin ER 1000 mg 2x a day (had N/D/AP with regular metformin). - Januvia 100 mg before b'fast (but has a h/o gall stones pancreatitis in 1995 and had flares afterwards, despite cholecystectomy) - VGo 30 >> 40: Stopped this as it kept coming off - smaller meal 4 >> 6 clicks - larger meal: 6 >> 8 clicks - snack: 1-2 clicks  [name] >> elevated Cr and Invokana >> yeast infection.  At last visit she was on: - Metformin 500 mg 3-4x a day with meals >> 1000 mg 2x a day - Januvia 100 mg before b'fast. - Humalog 15-25  >> 5-8 units before meals - Lantus (20-)40 units  at bedtime  She is currently on: - Farxiga 5 mg before breakfast - started 06/18/2020 - Ozempic 0.25 >> 0.5 mg weekly - started 2 weeks ago - Novolog 5-8 >> 30 >> 5-15 units before meals - Lantus 40 >> 50 units at bedtime She now also mentions intolerance to Metformin, Invokana, Januvia, Trulicity >> AP/nausea. Glipizide stopped when [place] started.  Pt checks her sugars >4x a day with her Freestyle Libre 2 CGM:    Previously: - am:  170-210 >> 106-138 >> 72-128, 132 >> 132-228, 307 - 2h after b'fast: n/c >>  548 - before lunch: 100-151 >> n/c >> 68 >> n/c - 2h after lunch: n/c >> 123-164 >> n/c >> 118-180 >> 211-263, 370 - before dinner: 97, 136-169 >> 180-200 >> n/c - 2h after dinner:170-200s >> 148-201 >> 152-217, 273 >> 209-392 - bedtime:  106-196, 218, 225 >> same as above - nighttime: n/c >> 30, 59, 63 >> n/c Lowest sugar was 106 >> 30 (night) >> 50s, lately 132 >> 68 Highest sugar was 201 >> 273 >> 548 >> 338  Glucometer: True Metric >> AccuCheck Guide  Pt's meals are: - Breakfast: instant oatmeal or yoghurt + banana - Lunch: salad + meat  - Dinner: meat + 1-2 veggies, less bread - Snacks: trail mix  -No CKD, last BUN/creatinine:  06/18/2020: 12/0.84, GFR 88, glucose 479 Lab Results  Component Value Date   BUN 13 09/03/2019   BUN 9 10/02/2018  CREATININE 0.70 09/03/2019   CREATININE 0.74 10/02/2018  02/26/2016: Glucose 143, BUN/creatinine 22/0.63, EGFR 107 On lisinopril 5. -+ HL; last set of lipids: 06/18/2020: 334/522/53/176 Lab Results  Component Value Date   CHOL 304 (H) 09/09/2017   HDL 48 09/09/2017   LDLCALC 198 (H) 09/09/2017   TRIG 289 (H) 09/09/2017   CHOLHDL 6.3 (H) 09/09/2017   02/26/2016: 160/179/52/71 On ezetimibe and fish oil. Had leg cramps from Lipitor. - last eye exam was 08/2019: No DR reportedly - no numbness but has tingling in her legs.  She was on Lyrica for carpal tunnel hands and pain in the feet from a previous injury.  She stopped  this before her last visit and symptoms have not returned.  On B12.  She does have a history of cervical cancer, fibromyalgia, anxiety/depression, low back pain -treated at [facility].  She is status post steroid injections and prednisone tapers.  On Lyrica and Flexeril.  She is on Depo-Provera.  ROS: Constitutional: + weight gain/no weight loss, + fatigue, no subjective hyperthermia, no subjective hypothermia, excessive urination, nocturia Eyes: no blurry vision, no xerophthalmia ENT: no sore throat, no nodules palpated in neck, no dysphagia, no odynophagia, no hoarseness Cardiovascular: no CP/no SOB/no palpitations/no leg swelling Respiratory: no cough/no SOB/no wheezing Gastrointestinal: + Nausea, no vomiting, + diarrhea, + constipation + acid reflux Musculoskeletal: + Muscle aches/+ joint aches Skin: no rashes, + hair loss Neurological: no tremors/no numbness/no tingling/no dizziness; improved headaches  I reviewed pt's medications, allergies, PMH, social hx, family hx, and changes were documented in the history of present illness. Otherwise, unchanged from my initial visit note.  Past Medical History:  Diagnosis Date  . Anxiety    Phreesia 06/09/2020  . Chronic back pain   . Depression    Phreesia 06/09/2020  . Diabetes mellitus without complication ([place])   . GERD (gastroesophageal reflux disease)    Phreesia 06/09/2020  . Hyperlipemia   . Hyperlipidemia    Phreesia 06/09/2020  . Hypertension   . Sinusitis, chronic 01/17/2019  . Sleep apnea    Phreesia 06/09/2020   Past Surgical History:  Procedure Laterality Date  . CHOLECYSTECTOMY    . ENDOMETRIAL ABLATION    . GALLBLADDER SURGERY    . TUBAL LIGATION     Social History   Social History  . Marital status: Married    Spouse name: [name]   . Number of children: 1   Occupational History  . CMA   Social History Main Topics  . Smoking status: Current Every Day Smoker    Packs/day: 1.00    Types:  Cigarettes  . Smokeless tobacco: Never Used  . Alcohol use No  . Drug use: No   Current Outpatient Medications on File Prior to Visit  Medication Sig Dispense Refill  . ACCU-CHEK FASTCLIX LANCETS MISC Use to check sugars 2-3 times daily 102 each 11  . busPIRone (BUSPAR) 15 MG tablet Take 15 mg by mouth 2 (two) times daily.    . cetirizine (ZYRTEC) 10 MG tablet Take 10 mg by mouth daily.    . Cholecalciferol (VITAMIN D) 2000 units CAPS Take 1 capsule by mouth daily.    . Continuous Blood Gluc Receiver (FREESTYLE LIBRE 2 READER) DEVI 1 each by Does not apply route daily. 1 each 0  . Continuous Blood Gluc Sensor (FREESTYLE LIBRE 2 SENSOR) MISC 1 each by Does not apply route every 14 (fourteen) days. 6 each 3  . cyclobenzaprine (FLEXERIL) 10 MG tablet Take 10  mg by mouth at bedtime.    . diclofenac sodium (VOLTAREN) 1 % GEL Apply 2 g topically 4 (four) times daily. 100 g 0  . escitalopram (LEXAPRO) 10 MG tablet Take 10 mg by mouth daily.    Marland Kitchen ezetimibe (ZETIA) 10 MG tablet Take 10 mg by mouth daily.    Marland Kitchen FARXIGA 5 MG TABS tablet Take 5 mg by mouth daily.    . fluticasone (FLONASE) 50 MCG/ACT nasal spray Place 2 sprays into both nostrils daily. 16 g 6  . glucose blood (ACCU-CHEK GUIDE) test strip Use as instructed 100 each 12  . insulin glargine (LANTUS) 100 UNIT/ML injection Inject 0.5 mLs (50 Units total) into the skin at bedtime. Pens please. 45 mL 3  . meloxicam (MOBIC) 15 MG tablet Take 15 mg by mouth daily.    Marland Kitchen NOVOLOG FLEXPEN 100 UNIT/ML FlexPen Inject into the skin.    . Omega-3 Fatty Acids (FISH OIL) 1000 MG CAPS Take 1 capsule by mouth 2 (two) times daily.    Marland Kitchen omeprazole (PRILOSEC) 40 MG capsule Take 40 mg by mouth daily.    . ondansetron (ZOFRAN) 4 MG tablet Take 1 tablet (4 mg total) by mouth every 8 (eight) hours as needed for nausea or vomiting. 20 tablet 0  . QUEtiapine (SEROQUEL) 100 MG tablet Take half tablet for one week then increase to one tablet by mouth at bedtime 30  tablet 3  . Semaglutide,0.25 or 0.5MG/DOS, (OZEMPIC, 0.25 OR 0.5 MG/DOSE,) 2 MG/1.5ML SOPN Inject 0.5 mg into the skin once a week. 4.5 mL 3  . UNIFINE PENTIPS 32G X 4 MM MISC USE TO INJECT INSULIN 4 TIMES DAILY 400 each 5   No current facility-administered medications on file prior to visit.   Allergies  Allergen Reactions  . Other Diarrhea and Rash  . Gabapentin Other (See Comments)  . Levemir [Insulin Detemir] Dermatitis    Whelps at injection site  . Keflex [Cephalexin] Rash  . Prednisone Palpitations    oral   Family History  Problem Relation Age of Onset  . Cancer Mother   . Diabetes Father   . Liver disease Father   . Heart attack Maternal Grandmother   . Cancer Paternal Grandfather    PE: BP 110/72 (BP Location: Left Arm, Patient Position: Sitting, Cuff Size: Normal)   Pulse (!) 104   Ht '5\' 2"'  (1.575 m)   Wt 276 lb (125.2 kg)   SpO2 97%   BMI 50.48 kg/m  Wt Readings from Last 3 Encounters:  08/06/20 276 lb (125.2 kg)  06/26/20 271 lb 6.4 oz (123.1 kg)  09/03/19 240 lb (108.9 kg)   Constitutional: overweight, in NAD Eyes: PERRLA, EOMI, no exophthalmos ENT: moist mucous membranes, no thyromegaly, no cervical lymphadenopathy Cardiovascular: tachycardia, RR, No MRG Respiratory: CTA B Gastrointestinal: abdomen soft, NT, ND, BS+ Musculoskeletal: no deformities, strength intact in all 4 Skin: moist, warm, no rashes Neurological: no tremor with outstretched hands, DTR normal in all 4  ASSESSMENT: 1. DM2, insulin-dependent, uncontrolled, without long term complications, but with hyper and hypoglycemia -History of pancreatitis -Gastroparesis -Peripheral neuropathy  2. HL  3.  Obesity class III   PLAN:  1. Patient with longstanding, uncontrolled, DM2, on oral antidiabetic regimen with as sugars daily better, also, basal/bolus insulin regimen, and now also on weekly GLP-1 receptor agonist, returning at last visit after a long absence, of 2.5 years.  Her HbA1c  increased from 6.8% to 9.6% in 06/2020.  At that time, she mentions  that her sugars started to increase after steroid injections for her back pain.  She was off metformin and Januvia and just added [place] before last visit.  She also increase Lantus.  Sugars were quite high, up to the 500s. -At last visit, we continued her Lantus and NovoLog along with [place] but I also suggested to add a GLP-1 receptor agonist.  I did give her a backup plan of increasing in case she could not get Ozempic or if she could not tolerate it (history of gastroparesis).  She was able to start Ozempic since last visit and she tolerates it well.  Of note, she tried Trulicity but she could not tolerate it due to GI symptoms.  She has a remote history of pancreatitis due to cholecystitis, approximately 30 years ago. -At this visit, she tells me that she was able to start [place].  She took 1 dose of 0.25 mg weekly, then increase to 0.5 mg weekly.  She tolerates this well, except for slight nausea in the day that she is taking the injection. -Since last visit, she obtained a freestyle libre 2 CGM. CGM interpretation: -At today's visit, we reviewed her CGM downloads: It appears that 41% of values are in target range (goal >70%), while 59% are higher than 180 (goal <25%), and 0% are lower than 70 (goal <4%).  The calculated average blood sugar is 192.  The projected HbA1c for the next 3 months ([facility]MI) is 7.9%. -Reviewing the CGM trends, it appears that her sugars increase in the second half of the night, peaking around 6 AM, and then they are again high after breakfast and after dinner.  Some of the high blood sugars are related to her steroid injections.  She had 2 injections this month.  They did not help with the back pain, but diminished to increase her blood sugars in the upper 200s.  - She continues on SGLT2 inhibitor and also basal-bolus insulin regimen and she just started Ozempic 2 weeks ago.  She used the lowest dose once and  then the 0.5 mg dose.  She had a slight nausea with it.  At this visit, we discussed about continuing with a 0.5 mg for 2 weeks and then increase to 1 mg weekly. - I suggested to:  Patient Instructions  Please continue: - Farxiga 5 mg before b'fast - Lantus 50 units at bedtime - NovoLog 5-15 units before each meal  Please continue: - Ozempic 0.5 mg weekly for 2 more weeks, then increase to 1 mg weekly.  Please return in 3 months.  - advised to check sugars at different times of the day - 4x a day, rotating check times - advised for yearly eye exams >> she is UTD - return to clinic in 3 months  2. HL -Reviewed latest lipid panel from 06/2020: Very high triglycerides and LDL -She is on Zetia 10 milligrams daily, omega-3 fatty acids tolerated well.  She was on Lipitor but she could not tolerate this. -Improving her diabetes will most likely improve her lipids, too  3. Obesity class III -since last visit, she gained 5 pounds.  She feels that in the last year, she gained approximately 30 pounds, most likely due to steroid injections and being inactive due to back pain. -continue SGLT 2 inhibitor and GLP-1 receptor agonist which should also help with weight loss  Philemon Kingdom, MD PhD [facility] Endocrinology

## 2020-08-06 NOTE — Patient Instructions (Addendum)
Please continue: - Farxiga 5 mg before b'fast - Lantus 50 units at bedtime - NovoLog 5-15 units before each meal  Please continue: - Ozempic 0.5 mg weekly for 2 more weeks, then increase to 1 mg weekly.  Please return in 3 months.

## 2020-09-24 ENCOUNTER — Encounter: Payer: Self-pay | Admitting: Internal Medicine

## 2020-09-24 DIAGNOSIS — E1142 Type 2 diabetes mellitus with diabetic polyneuropathy: Secondary | ICD-10-CM

## 2020-09-28 MED ORDER — FARXIGA 5 MG PO TABS: 5.0000 mg | ORAL_TABLET | Freq: Every day | ORAL | 1 refills | Status: DC

## 2020-10-06 ENCOUNTER — Telehealth: Payer: Self-pay | Admitting: Internal Medicine

## 2020-10-06 NOTE — Telephone Encounter (Signed)
Pt would like to see if there are any samples of  due to financial emergency  insulin glargine (LANTUS) 100 UNIT/ML injection  NOVOLOG FLEXPEN 100 UNIT/ML FlexPen

## 2020-10-14 NOTE — Telephone Encounter (Signed)
Please advise 

## 2020-10-15 NOTE — Telephone Encounter (Signed)
Yes, absolutely, we can give her 1 or 2 pens of each.

## 2020-10-15 NOTE — Telephone Encounter (Signed)
Called and left an voicemail to informing pt that it okay for patient samples per Dr Reece Agar. Asked pt call us back regarding this.

## 2020-11-02 ENCOUNTER — Ambulatory Visit: Payer: 59 | Admitting: Internal Medicine

## 2020-11-02 NOTE — Progress Notes (Deleted)
Patient ID: Kelsey Vang, female   DOB: 1975/11/02, 45 y.o.   MRN: [id number]   This visit occurred during the SARS-CoV-2 public health emergency.  Safety protocols were in place, including screening questions prior to the visit, additional usage of staff PPE, and extensive cleaning of exam room while observing appropriate contact time as indicated for disinfecting solutions.   HPI: Kelsey Vang is a 45 y.o.-year-old female, returning for f/u for DM2, dx in 2010, insulin-dependent since 01/2016, uncontrolled, with long term complications (gastroparesis, peripheral neuropathy). She is the wife of Kelsey Vang, who is also my pt. Last visit 3 months ago.  Interim hx: She has a history of steroid epidural injections and may need back surgery.  Review latest HbA1c levels: 06/18/2020: HbA1c 9.6% previously 9.8% Lab Results  Component Value Date   HGBA1C 6.8 (A) 01/26/2018   HGBA1C 7.9 08/25/2017   HGBA1C 8.5 05/26/2017  02/26/2016: HbA1c 9.2%. Surprisingly, the fructosamine level checked at the same time was 271 (corresponding to the calculated  HbA1c of 6.2%, which, however, does not correlate with the sugars checked at home) 04/07/2015: HbA1c 7.1%  Pt was on a regimen of: - Metformin ER 1000 mg 2x a day (had N/D/AP with regular metformin). - Januvia 100 mg before b'fast (but has a h/o gall stones pancreatitis in 1995 and had flares afterwards, despite cholecystectomy) - VGo 30 >> 40: Stopped this as it kept coming off - smaller meal 4 >> 6 clicks - larger meal: 6 >> 8 clicks - snack: 1-2 clicks  [name] >> elevated Cr and Invokana >> yeast infection.  Then on: - Metformin 500 mg 3-4x a day with meals >> 1000 mg 2x a day - Januvia 100 mg before b'fast. - Humalog 15-25  >> 5-8 units before meals - Lantus (20-)40 units at bedtime  She is currently on: - Farxiga 5 mg before breakfast - started 06/18/2020 - Ozempic 0.25 >> 0.5 >> 1 mg weekly  - Novolog 5-8 >> 30 >> 5-15  units before meals - Lantus 40 >> 50 units at bedtime She now also mentions intolerance to Metformin, Invokana, Januvia, Trulicity >> AP/nausea. Glipizide stopped when [place] started.  Pt checks her sugars >4x a day with her Freestyle Libre 2 CGM:  Previously:   Lowest sugar was 106 >> 30 (night) >> 50s, lately 132 >> 68 Highest sugar was 201 >> 273 >> 548 >> 338  Glucometer: True Metric >> AccuCheck Guide  Pt's meals are: - Breakfast: instant oatmeal or yoghurt + banana - Lunch: salad + meat  - Dinner: meat + 1-2 veggies, less bread - Snacks: trail mix  -No CKD, last BUN/creatinine:  06/18/2020: 12/0.84, GFR 88, glucose 479 Lab Results  Component Value Date   BUN 13 09/03/2019   BUN 9 10/02/2018   CREATININE 0.70 09/03/2019   CREATININE 0.74 10/02/2018  02/26/2016: Glucose 143, BUN/creatinine 22/0.63, EGFR 107 On lisinopril 5.  -+ HL; last set of lipids: 06/18/2020: 334/522/53/176 Lab Results  Component Value Date   CHOL 304 (H) 09/09/2017   HDL 48 09/09/2017   LDLCALC 198 (H) 09/09/2017   TRIG 289 (H) 09/09/2017   CHOLHDL 6.3 (H) 09/09/2017   02/26/2016: 160/179/52/71 On ezetimibe and fish oil. Had leg cramps from Lipitor.  - last eye exam was 08/2019: No DR reportedly  - no numbness but has tingling in her legs.  She was on Lyrica for carpal tunnel hands and pain in the feet from a previous injury.  She stopped  this before her last visit and symptoms have not returned.  On B12.  She does have a history of cervical cancer, fibromyalgia, anxiety/depression, low back pain -treated at [facility].  She is status post steroid injections and prednisone tapers.  On Lyrica and Flexeril.  She is on Depo-Provera.  ROS: Constitutional: + weight gain/no weight loss, + fatigue, no subjective hyperthermia, no subjective hypothermia, excessive urination, nocturia Eyes: no blurry vision, no xerophthalmia ENT: no sore throat, no nodules palpated in neck, no dysphagia, no  odynophagia, no hoarseness Cardiovascular: no CP/no SOB/no palpitations/no leg swelling Respiratory: no cough/no SOB/no wheezing Gastrointestinal: + Nausea, no vomiting, + diarrhea, + constipation + acid reflux Musculoskeletal: + Muscle aches/+ joint aches Skin: no rashes, + hair loss Neurological: no tremors/no numbness/no tingling/no dizziness; improved headaches  I reviewed pt's medications, allergies, PMH, social hx, family hx, and changes were documented in the history of present illness. Otherwise, unchanged from my initial visit note.  Past Medical History:  Diagnosis Date   Anxiety    Phreesia 06/09/2020   Chronic back pain    Depression    Phreesia 06/09/2020   Diabetes mellitus without complication ([place])    GERD (gastroesophageal reflux disease)    Phreesia 06/09/2020   Hyperlipemia    Hyperlipidemia    Phreesia 06/09/2020   Hypertension    Sinusitis, chronic 01/17/2019   Sleep apnea    Phreesia 06/09/2020   Past Surgical History:  Procedure Laterality Date   CHOLECYSTECTOMY     ENDOMETRIAL ABLATION     GALLBLADDER SURGERY     TUBAL LIGATION     Social History   Social History   Marital status: Married    Spouse name: Kelsey Vang    Number of children: 1   Occupational History   CMA   Social History Main Topics   Smoking status: Current Every Day Smoker    Packs/day: 1.00    Types: Cigarettes   Smokeless tobacco: Never Used   Alcohol use No   Drug use: No   Current Outpatient Medications on File Prior to Visit  Medication Sig Dispense Refill   ACCU-CHEK FASTCLIX LANCETS MISC Use to check sugars 2-3 times daily 102 each 11   busPIRone (BUSPAR) 15 MG tablet Take 15 mg by mouth 2 (two) times daily.     cetirizine (ZYRTEC) 10 MG tablet Take 10 mg by mouth daily.     Cholecalciferol (VITAMIN D) 2000 units CAPS Take 1 capsule by mouth daily.     Continuous Blood Gluc Receiver (FREESTYLE LIBRE 2 READER) DEVI 1 each by Does not apply route daily. 1 each  0   Continuous Blood Gluc Sensor (FREESTYLE LIBRE 2 SENSOR) MISC 1 each by Does not apply route every 14 (fourteen) days. 6 each 3   cyclobenzaprine (FLEXERIL) 10 MG tablet Take 10 mg by mouth at bedtime.     diclofenac sodium (VOLTAREN) 1 % GEL Apply 2 g topically 4 (four) times daily. 100 g 0   escitalopram (LEXAPRO) 10 MG tablet Take 10 mg by mouth daily.     ezetimibe (ZETIA) 10 MG tablet Take 10 mg by mouth daily.     FARXIGA 5 MG TABS tablet Take 1 tablet (5 mg total) by mouth daily. 90 tablet 1   fluticasone (FLONASE) 50 MCG/ACT nasal spray Place 2 sprays into both nostrils daily. 16 g 6   glucose blood (ACCU-CHEK GUIDE) test strip Use as instructed 100 each 12   insulin glargine (LANTUS) 100 UNIT/ML injection  Inject 0.5 mLs (50 Units total) into the skin at bedtime. Pens please. 45 mL 3   meloxicam (MOBIC) 15 MG tablet Take 15 mg by mouth daily.     NOVOLOG FLEXPEN 100 UNIT/ML FlexPen Inject into the skin.     Omega-3 Fatty Acids (FISH OIL) 1000 MG CAPS Take 1 capsule by mouth 2 (two) times daily.     omeprazole (PRILOSEC) 40 MG capsule Take 40 mg by mouth daily.     ondansetron (ZOFRAN) 4 MG tablet Take 1 tablet (4 mg total) by mouth every 8 (eight) hours as needed for nausea or vomiting. 20 tablet 0   pregabalin (LYRICA) 150 MG capsule Take 150 mg by mouth 2 (two) times daily.     QUEtiapine (SEROQUEL) 100 MG tablet Take half tablet for one week then increase to one tablet by mouth at bedtime 30 tablet 3   Semaglutide, 1 MG/DOSE, (OZEMPIC, 1 MG/DOSE,) 4 MG/3ML SOPN Inject 1 mg into the skin once a week. 9 mL 3   UNIFINE PENTIPS 32G X 4 MM MISC USE TO INJECT INSULIN 4 TIMES DAILY 400 each 5   No current facility-administered medications on file prior to visit.   Allergies  Allergen Reactions   Other Diarrhea and Rash   Gabapentin Other (See Comments)   Levemir [Insulin Detemir] Dermatitis    Whelps at injection site   Keflex [Cephalexin] Rash   Prednisone Palpitations    oral    Family History  Problem Relation Age of Onset   Cancer Mother    Diabetes Father    Liver disease Father    Heart attack Maternal Grandmother    Cancer Paternal Grandfather    PE: There were no vitals taken for this visit. Wt Readings from Last 3 Encounters:  08/06/20 276 lb (125.2 kg)  06/26/20 271 lb 6.4 oz (123.1 kg)  09/03/19 240 lb (108.9 kg)   Constitutional: overweight, in NAD Eyes: PERRLA, EOMI, no exophthalmos ENT: moist mucous membranes, no thyromegaly, no cervical lymphadenopathy Cardiovascular: tachycardia, RR, No MRG Respiratory: CTA B Gastrointestinal: abdomen soft, NT, ND, BS+ Musculoskeletal: no deformities, strength intact in all 4 Skin: moist, warm, no rashes Neurological: no tremor with outstretched hands, DTR normal in all 4  ASSESSMENT: 1. DM2, insulin-dependent, uncontrolled, without long term complications, but with hyper and hypoglycemia -History of pancreatitis -Gastroparesis -Peripheral neuropathy  2. HL  3.  Obesity class III   PLAN:  1. Patient with longstanding, uncontrolled, type 2 diabetes, on a complex antidiabetic regimen including SGLT2 inhibitor, basal-bolus insulin regimen, and also weekly GLP-1 receptor agonist, increased at last visit.  She initially had slight nausea with Ozempic, but this improved.  We increased Ozempic gradually to 1 mg weekly as sugars were increasing in the second half of the night and then again after breakfast and after dinner.  This could be related to her steroid injections.  She was not planning to have any more injections since they did not help much with back pain.  CGM interpretation: -At today's visit, we reviewed her CGM downloads: It appears that *** of values are in target range (goal >70%), while *** are higher than 180 (goal <25%), and *** are lower than 70 (goal <4%).  The calculated average blood sugar is ***.  The projected HbA1c for the next 3 months (GMI) is ***. -Reviewing the CGM trends,  ***  - I suggested to:  Patient Instructions  Please continue: - Farxiga 5 mg before b'fast - Lantus 50 units at  bedtime - NovoLog 5-15 units before each meal - Ozempic 1 mg weekly  Please return in 3-4 months.  - we checked her HbA1c: 7%  - advised to check sugars at different times of the day - 4x a day, rotating check times - advised for yearly eye exams >> she is UTD - return to clinic in 3-4 months  2. HL -Reviewed latest lipid panel from 06/2020: Very high triglycerides and LDL -She is on Zetia 10 mg daily, omega-3 fatty acids-tolerated well.  She could not tolerate Lipitor in the past. -Improving her diabetes control will most likely have an influence on her lipids, to  3. Obesity class III -At last visit she was telling me that in the previous year, she felt she gained approximately 30 pounds, most likely due to steroid injections and being inactive due to back pain -continue SGLT 2 inhibitor and GLP-1 receptor agonist which should also help with weight loss  Philemon Kingdom, MD PhD [facility] Endocrinology

## 2020-12-04 ENCOUNTER — Ambulatory Visit (INDEPENDENT_AMBULATORY_CARE_PROVIDER_SITE_OTHER): Payer: 59

## 2020-12-04 ENCOUNTER — Other Ambulatory Visit: Payer: Self-pay

## 2020-12-04 ENCOUNTER — Ambulatory Visit
Admission: RE | Admit: 2020-12-04 | Discharge: 2020-12-04 | Disposition: A | Discharge status: Home / Self Care | Payer: 59 | Source: Ambulatory Visit | Attending: Urgent Care | Admitting: Urgent Care

## 2020-12-04 VITALS — BP 106/78 | HR 108 | Temp 98.0°F | Resp 18

## 2020-12-04 DIAGNOSIS — Z794 Long term (current) use of insulin: Secondary | ICD-10-CM | POA: Insufficient documentation

## 2020-12-04 DIAGNOSIS — E119 Type 2 diabetes mellitus without complications: Secondary | ICD-10-CM | POA: Insufficient documentation

## 2020-12-04 DIAGNOSIS — U071 COVID-19: Secondary | ICD-10-CM | POA: Diagnosis not present

## 2020-12-04 DIAGNOSIS — E118 Type 2 diabetes mellitus with unspecified complications: Secondary | ICD-10-CM

## 2020-12-04 DIAGNOSIS — R109 Unspecified abdominal pain: Secondary | ICD-10-CM | POA: Diagnosis not present

## 2020-12-04 HISTORY — DX: Irritable bowel syndrome, unspecified: K58.9

## 2020-12-04 LAB — POCT URINALYSIS DIP (MANUAL ENTRY)
Bilirubin, UA: NEGATIVE
Glucose, UA: >=1000 mg/dL — AB
Ketones, POC UA: NEGATIVE mg/dL
Leukocytes, UA: NEGATIVE
Nitrite, UA: NEGATIVE
Protein Ur, POC: NEGATIVE mg/dL
Spec Grav, UA: 1.015 (ref 1.010–1.025)
Urobilinogen, UA: 0.2 E.U./dL
pH, UA: 6 (ref 5.0–8.0)

## 2020-12-04 NOTE — Discharge Instructions (Signed)
We will manage this as a viral syndrome, recurrent COVID 19. For sore throat or cough try using a honey-based tea. Use 3 teaspoons of honey with juice squeezed from half lemon. Place shaved pieces of ginger into 1/2-1 cup of water and warm over stove top. Then mix the ingredients and repeat every 4 hours as needed. Please take Tylenol 500mg -650mg  every 6 hours for aches and pains, fevers. Hydrate very well with at least 2 liters of water. Eat light meals such as soups to replenish electrolytes and soft fruits, veggies. Start an antihistamine like Zyrtec, Allegra or Claritin for postnasal drainage, sinus congestion.  You can take this together with pseudoephedrine (Sudafed) at a dose of 30 mg 2-3 times a day as needed for the same kind of congestion.

## 2020-12-04 NOTE — ED Provider Notes (Signed)
[facility]   MRN: [id number] DOB: 1976/03/26  Subjective:   Kelsey Vang is a 45 y.o. female presenting for 4-day history of acute onset subjective fever, right-sided thoracic back pain, right flank pain, malaise.  Patient was actually sick a week before that.  Tested positive for COVID using an at-home test on 11/23/2020.  Since then she has been using Tylenol and Mucinex without relief.  She is also type II diabetic, treated with insulin, takes [place].  Denies runny or stuffy nose, sore throat, cough, chest pain, shortness of breath, hemoptysis, dysuria, hematuria, urinary frequency.  No history of kidney stones.  She does have a history of cholecystectomy.  Denies any difficulty with constipation, changes to bowel movements.  Patient is concerned about having pneumonia.  She has had COVID-19 and states that she got pneumonia with this.  Last episode was February 2021.  No current facility-administered medications for this encounter.  Current Outpatient Medications:    ACCU-CHEK FASTCLIX LANCETS MISC, Use to check sugars 2-3 times daily, Disp: 102 each, Rfl: 11   busPIRone (BUSPAR) 15 MG tablet, Take 15 mg by mouth 2 (two) times daily., Disp: , Rfl:    cetirizine (ZYRTEC) 10 MG tablet, Take 10 mg by mouth daily., Disp: , Rfl:    Cholecalciferol (VITAMIN D) 2000 units CAPS, Take 1 capsule by mouth daily., Disp: , Rfl:    Continuous Blood Gluc Receiver (FREESTYLE LIBRE 2 READER) DEVI, 1 each by Does not apply route daily., Disp: 1 each, Rfl: 0   Continuous Blood Gluc Sensor (FREESTYLE LIBRE 2 SENSOR) MISC, 1 each by Does not apply route every 14 (fourteen) days., Disp: 6 each, Rfl: 3   cyclobenzaprine (FLEXERIL) 10 MG tablet, Take 10 mg by mouth at bedtime., Disp: , Rfl:    diclofenac sodium (VOLTAREN) 1 % GEL, Apply 2 g topically 4 (four) times daily., Disp: 100 g, Rfl: 0   escitalopram (LEXAPRO) 10 MG tablet, Take 10 mg by mouth daily., Disp: , Rfl:    ezetimibe (ZETIA) 10  MG tablet, Take 10 mg by mouth daily., Disp: , Rfl:    FARXIGA 5 MG TABS tablet, Take 1 tablet (5 mg total) by mouth daily., Disp: 90 tablet, Rfl: 1   fluticasone (FLONASE) 50 MCG/ACT nasal spray, Place 2 sprays into both nostrils daily., Disp: 16 g, Rfl: 6   glucose blood (ACCU-CHEK GUIDE) test strip, Use as instructed, Disp: 100 each, Rfl: 12   insulin glargine (LANTUS) 100 UNIT/ML injection, Inject 0.5 mLs (50 Units total) into the skin at bedtime. Pens please., Disp: 45 mL, Rfl: 3   meloxicam (MOBIC) 15 MG tablet, Take 15 mg by mouth daily., Disp: , Rfl:    NOVOLOG FLEXPEN 100 UNIT/ML FlexPen, Inject into the skin., Disp: , Rfl:    Omega-3 Fatty Acids (FISH OIL) 1000 MG CAPS, Take 1 capsule by mouth 2 (two) times daily., Disp: , Rfl:    omeprazole (PRILOSEC) 40 MG capsule, Take 40 mg by mouth daily., Disp: , Rfl:    ondansetron (ZOFRAN) 4 MG tablet, Take 1 tablet (4 mg total) by mouth every 8 (eight) hours as needed for nausea or vomiting., Disp: 20 tablet, Rfl: 0   pregabalin (LYRICA) 150 MG capsule, Take 75 mg by mouth 2 (two) times daily., Disp: , Rfl:    QUEtiapine (SEROQUEL) 100 MG tablet, Take half tablet for one week then increase to one tablet by mouth at bedtime, Disp: 30 tablet, Rfl: 3   Semaglutide, 1 MG/DOSE, (OZEMPIC,  1 MG/DOSE,) 4 MG/3ML SOPN, Inject 1 mg into the skin once a week., Disp: 9 mL, Rfl: 3   UNIFINE PENTIPS 32G X 4 MM MISC, USE TO INJECT INSULIN 4 TIMES DAILY, Disp: 400 each, Rfl: 5   Allergies  Allergen Reactions   Other Diarrhea and Rash   Gabapentin Other (See Comments)   Levemir [Insulin Detemir] Dermatitis    Whelps at injection site   Metoclopramide Other (See Comments)    headache   Keflex [Cephalexin] Rash   Prednisone Palpitations    oral    Past Medical History:  Diagnosis Date   Anxiety    Phreesia 06/09/2020   Chronic back pain    Depression    Phreesia 06/09/2020   Diabetes mellitus without complication (H[facility])    GERD (gastroesophageal  reflux disease)    Phreesia 06/09/2020   Hyperlipemia    Hyperlipidemia    Phreesia 06/09/2020   Hypertension    IBS (irritable bowel syndrome)    Sinusitis, chronic 01/17/2019   Sleep apnea    Phreesia 06/09/2020     Past Surgical History:  Procedure Laterality Date   CHOLECYSTECTOMY     ENDOMETRIAL ABLATION     GALLBLADDER SURGERY     TUBAL LIGATION      Family History  Problem Relation Age of Onset   Cancer Mother    Diabetes Father    Liver disease Father    Heart attack Maternal Grandmother    Cancer Paternal Grandfather     Social History   Tobacco Use   Smoking status: Every Day    Packs/day: 0.50    Years: 20.00    Pack years: 10.00    Types: Cigarettes   Smokeless tobacco: Never  Vaping Use   Vaping Use: Former  Substance Use Topics   Alcohol use: No   Drug use: No    ROS   Objective:   Vitals: BP 106/78 (BP Location: Right Arm)   Pulse (!) 108   Temp 98 F (36.7 C) (Oral)   Resp 18   SpO2 94%   Physical Exam Constitutional:      General: She is not in acute distress.    Appearance: Normal appearance. She is well-developed. She is obese. She is not ill-appearing, toxic-appearing or diaphoretic.  HENT:     Head: Normocephalic and atraumatic.     Nose: Nose normal.     Mouth/Throat:     Mouth: Mucous membranes are moist.  Eyes:     Extraocular Movements: Extraocular movements intact.     Pupils: Pupils are equal, round, and reactive to light.  Cardiovascular:     Rate and Rhythm: Normal rate and regular rhythm.     Pulses: Normal pulses.     Heart sounds: Normal heart sounds. No murmur heard.   No friction rub. No gallop.  Pulmonary:     Effort: Pulmonary effort is normal. No respiratory distress.     Breath sounds: Normal breath sounds. No stridor. No wheezing, rhonchi or rales.  Abdominal:     General: Bowel sounds are normal. There is no distension.     Palpations: Abdomen is soft. There is no mass.     Tenderness: There is no  abdominal tenderness. There is right CVA tenderness. There is no left CVA tenderness, guarding or rebound.  Skin:    General: Skin is warm and dry.     Findings: No rash.  Neurological:     Mental Status: She is alert and oriented  to person, place, and time.  Psychiatric:        Mood and Affect: Mood normal.        Behavior: Behavior normal.        Thought Content: Thought content normal.    Results for orders placed or performed during the hospital encounter of 12/04/20 (from the past 24 hour(s))  POCT urinalysis dipstick     Status: Abnormal   Collection Time: 12/04/20  1:21 PM  Result Value Ref Range   Color, UA yellow yellow   Clarity, UA clear clear   Glucose, UA >=1,000 (A) negative mg/dL   Bilirubin, UA negative negative   Ketones, POC UA negative negative mg/dL   Spec Grav, UA 0.263 7.858 - 1.025   Blood, UA trace-intact (A) negative   pH, UA 6.0 5.0 - 8.0   Protein Ur, POC negative negative mg/dL   Urobilinogen, UA 0.2 0.2 or 1.0 E.U./dL   Nitrite, UA Negative Negative   Leukocytes, UA Negative Negative   DG Chest 2 View  Result Date: 12/04/2020 CLINICAL DATA:  Back pain and hypoxia. Positive at home COVID test on August 8th EXAM: CHEST - 2 VIEW COMPARISON:  09/03/2019 FINDINGS: The heart size and mediastinal contours are within normal limits. Both lungs are clear. The visualized skeletal structures are unremarkable. IMPRESSION: No active cardiopulmonary disease. Electronically Signed   By: Signa Kell M.D.   On: 12/04/2020 14:34     Assessment and Plan :   PDMP not reviewed this encounter.  1. Clinical diagnosis of COVID-19   2. Right flank pain   3. Type 2 diabetes mellitus treated with insulin [facility])     Patient is outside the window for COVID-19 treatment.  She refused to repeat test today in clinic given that she was already positive on 11/23/2020.  I am in agreement.  Chest x-ray is normal, clear cardiopulmonary exam.  No signs of urinary tract infection,  low suspicion for pyelonephritis given urinalysis.  Urine culture pending.  Recommended supportive care. Counseled patient on potential for adverse effects with medications prescribed/recommended today, ER and return-to-clinic precautions discussed, patient verbalized understanding.    Wallis Bamberg, PA-C 12/04/20 1524

## 2020-12-04 NOTE — ED Triage Notes (Signed)
Pt tested positive for covid with an at home test on 8/8.  Four day h/o fever and right sided thoracic back pain. Tmax 100.8. Has been taking mucinex and tylenol with some relief.

## 2020-12-06 LAB — URINE CULTURE: Culture: NO GROWTH

## 2021-01-06 ENCOUNTER — Telehealth: Payer: Self-pay | Admitting: Internal Medicine

## 2021-01-06 DIAGNOSIS — Z794 Long term (current) use of insulin: Secondary | ICD-10-CM

## 2021-01-06 DIAGNOSIS — E1142 Type 2 diabetes mellitus with diabetic polyneuropathy: Secondary | ICD-10-CM

## 2021-01-06 NOTE — Telephone Encounter (Signed)
MEDICATION: NOVOLOG - patient wants vials not pen, because she does not use the flexpen as a method of insulin delivery she draws from the pen into a syringe.  PHARMACY:  [facility] on [address], [place]  HAS THE PATIENT CONTACTED THEIR PHARMACY?    IS THIS A 90 DAY SUPPLY : unknown  IS PATIENT OUT OF MEDICATION: no  IF NOT; HOW MUCH IS LEFT: 1-2 days worth  LAST APPOINTMENT DATE: @7 /18/2022  NEXT APPOINTMENT DATE:@10 /14/2022  DO WE HAVE YOUR PERMISSION TO LEAVE A DETAILED MESSAGE?: yes  OTHER COMMENTS: patient wants vials not pen, because she does not use the flexpen as a method of insulin delivery she draws from the pen into a syringe.

## 2021-01-07 ENCOUNTER — Encounter (HOSPITAL_BASED_OUTPATIENT_CLINIC_OR_DEPARTMENT_OTHER): Payer: Self-pay

## 2021-01-07 ENCOUNTER — Emergency Department (HOSPITAL_BASED_OUTPATIENT_CLINIC_OR_DEPARTMENT_OTHER)
Admission: EM | Admit: 2021-01-07 | Discharge: 2021-01-07 | Disposition: A | Discharge status: Home / Self Care | Payer: 59 | Attending: Emergency Medicine | Admitting: Emergency Medicine

## 2021-01-07 ENCOUNTER — Other Ambulatory Visit: Payer: Self-pay

## 2021-01-07 DIAGNOSIS — W010XXA Fall on same level from slipping, tripping and stumbling without subsequent striking against object, initial encounter: Secondary | ICD-10-CM | POA: Insufficient documentation

## 2021-01-07 DIAGNOSIS — M545 Low back pain, unspecified: Secondary | ICD-10-CM | POA: Insufficient documentation

## 2021-01-07 DIAGNOSIS — M79605 Pain in left leg: Secondary | ICD-10-CM | POA: Diagnosis not present

## 2021-01-07 DIAGNOSIS — Z5321 Procedure and treatment not carried out due to patient leaving prior to being seen by health care provider: Secondary | ICD-10-CM | POA: Insufficient documentation

## 2021-01-07 DIAGNOSIS — G8929 Other chronic pain: Secondary | ICD-10-CM | POA: Diagnosis not present

## 2021-01-07 HISTORY — DX: Type 2 diabetes mellitus with diabetic autonomic (poly)neuropathy: K31.84

## 2021-01-07 HISTORY — DX: Type 2 diabetes mellitus with diabetic autonomic (poly)neuropathy: E11.43

## 2021-01-07 NOTE — ED Triage Notes (Signed)
Fell at 10:30pm at home due to chronic back pain/sciatic nerve problems she lost her balance and fell onto her left side, hitting her head and chipping a tooth. She has new acute on chronic left sided back and leg pain.  Denies LOC, skin intact.  Not on any blood thinners.  NAD

## 2021-01-13 MED ORDER — INSULIN ASPART 100 UNIT/ML IJ SOLN: 10.0000 [IU] | Freq: Three times a day (TID) | INTRAMUSCULAR | 0 refills | Status: DC

## 2021-01-13 NOTE — Telephone Encounter (Signed)
Rx sent to preferred pharmacy.

## 2021-01-13 NOTE — Addendum Note (Signed)
Addended by: Kenyon Ana on: 01/13/2021 11:52 AM   Modules accepted: Orders

## 2021-01-13 NOTE — Telephone Encounter (Signed)
Pt calling requesting vials of insulin. Pt completely out. Pharmacy [facility] [address] [place], Pt contact [phone]

## 2021-01-29 ENCOUNTER — Other Ambulatory Visit: Payer: Self-pay

## 2021-01-29 ENCOUNTER — Encounter: Payer: Self-pay | Admitting: Internal Medicine

## 2021-01-29 ENCOUNTER — Ambulatory Visit (INDEPENDENT_AMBULATORY_CARE_PROVIDER_SITE_OTHER): Payer: 59 | Admitting: Internal Medicine

## 2021-01-29 VITALS — BP 122/78 | HR 94 | Ht 62.0 in | Wt 263.4 lb

## 2021-01-29 DIAGNOSIS — E785 Hyperlipidemia, unspecified: Secondary | ICD-10-CM | POA: Diagnosis not present

## 2021-01-29 DIAGNOSIS — Z794 Long term (current) use of insulin: Secondary | ICD-10-CM | POA: Diagnosis not present

## 2021-01-29 DIAGNOSIS — E1142 Type 2 diabetes mellitus with diabetic polyneuropathy: Secondary | ICD-10-CM

## 2021-01-29 LAB — POCT GLYCOSYLATED HEMOGLOBIN (HGB A1C): Hemoglobin A1C: 8.9 % — AB (ref 4.0–5.6)

## 2021-01-29 MED ORDER — INSULIN ASPART 100 UNIT/ML IJ SOLN: 15.0000 [IU] | Freq: Three times a day (TID) | INTRAMUSCULAR | 3 refills | Status: DC

## 2021-01-29 MED ORDER — INSULIN GLARGINE 100 UNIT/ML ~~LOC~~ SOLN: SUBCUTANEOUS | 3 refills | Status: DC

## 2021-01-29 NOTE — Patient Instructions (Addendum)
Please continue: - Farxiga 5 mg before b'fast - Lantus 15-20 units in am and 30 units at bedtime  Change: - Ozempic 0.5 mg 2x weekly  Please increase: - NovoLog 15-20 units before each meal  Please return in 3 months.

## 2021-01-29 NOTE — Progress Notes (Signed)
Patient ID: Kelsey Vang, female   DOB: [birth year], 45 y.o.   MRN: [id number]   This visit occurred during the SARS-CoV-2 public health emergency.  Safety protocols were in place, including screening questions prior to the visit, additional usage of staff PPE, and extensive cleaning of exam room while observing appropriate contact time as indicated for disinfecting solutions.   HPI: Kelsey Vang is a 45 y.o.-year-old female, returning for f/u for DM2, dx in 2010, insulin-dependent since 01/2016, uncontrolled, with long term complications (gastroparesis, peripheral neuropathy). She is the wife of Kelsey Vang, who is also my pt. Last visit 6 mo ago.  Interim hx: No increased urination, blurry vision, nausea, chest pain. She continues to have significant back pain - tingling in both feet at night. On Lyrica 300 mg daily.  She was able to lose more than 20 pounds since last visit, but gained 9 lbs in last month - on steroid inj and oral courses.  She will have appointment with neurosurgery and may need back surgery. She had COVID-19 in 11/2020. She was on ABx. She had a  gastric emptying study 05/2019 which showed delayed gastric emptying.  Review latest HbA1c levels: 06/18/2020: HbA1c 9.6% previously 9.8% Lab Results  Component Value Date   HGBA1C 6.8 (A) 01/26/2018   HGBA1C 7.9 08/25/2017   HGBA1C 8.5 05/26/2017  02/26/2016: HbA1c 9.2%. Surprisingly, the fructosamine level checked at the same time was 271 (corresponding to the calculated  HbA1c of 6.2%, which, however, does not correlate with the sugars checked at home) 04/07/2015: HbA1c 7.1%  Pt was on a regimen of: - Metformin ER 1000 mg 2x a day (had N/D/AP with regular metformin). - Januvia 100 mg before b'fast (but has a h/o gall stones pancreatitis in 1995 and had flares afterwards, despite cholecystectomy) - VGo 30 >> 40: Stopped this as it kept coming off - smaller meal 4 >> 6 clicks - larger meal: 6 >> 8 clicks - snack:  1-2 clicks  Tried  Invokana >> yeast infection.  Then on: - Metformin 500 mg 3-4x a day with meals >> 1000 mg 2x a day - Januvia 100 mg before b'fast. - Humalog 15-25  >> 5-8 units before meals - Lantus (20-)40 units at bedtime  She is currently on: - Farxiga 5 mg before breakfast - started 06/18/2020 - Ozempic 0.25 >> 0.5 >> 1 mg weekly - Novolog 5-8 >> 30 >> 5-15 units before meals - Lantus 40 >> 50 >> 15-20 units  in am and 30 units at bedtime She now also mentions intolerance to Metformin, Invokana, Januvia, Trulicity >> AP/nausea. Glipizide stopped when [place] started.  Pt checks her sugars >4x a day with her Freestyle Libre 2 CGM:   Previously:   Lowest sugar was 106 >> 30 (night) >> 50s >> 68 >> 64 (CGM) Highest sugar was 201 >> 273 >> 548 >> 338 >> 300s  Glucometer: True Metric >> AccuCheck Guide  Pt's meals are: - Breakfast: instant oatmeal or yoghurt + banana - Lunch: salad + meat  - Dinner: meat + 1-2 veggies, less bread - Snacks: trail mix  -No CKD, last BUN/creatinine:  06/18/2020: 12/0.84, GFR 88, glucose 479 Lab Results  Component Value Date   BUN 13 09/03/2019   BUN 9 10/02/2018   CREATININE 0.70 09/03/2019   CREATININE 0.74 10/02/2018  02/26/2016: Glucose 143, BUN/creatinine 22/0.63, EGFR 107 On lisinopril 5.  -+ HL; last set of lipids: 06/18/2020: 334/522/53/176 Lab Results  Component Value Date  CHOL 304 (H) 09/09/2017   HDL 48 09/09/2017   LDLCALC 198 (H) 09/09/2017   TRIG 289 (H) 09/09/2017   CHOLHDL 6.3 (H) 09/09/2017   02/26/2016: 160/179/52/71 On ezetimibe and fish oil. Had leg cramps from Lipitor.  - last eye exam was 08/2019: No DR reportedly  - no numbness but has tingling in her legs.  She was on Lyrica for carpal tunnel hands and pain in the feet from a previous injury.  She stopped this before her last visit and symptoms have not returned.  On B12.  She does have a history of cervical cancer, fibromyalgia, anxiety/depression,  low back pain -treated at [facility].  She is status post steroid injections and prednisone tapers.  On Lyrica and Flexeril.  She is on Depo-Provera.  ROS: + See HPI  I reviewed pt's medications, allergies, PMH, social hx, family hx, and changes were documented in the history of present illness. Otherwise, unchanged from my initial visit note.  Past Medical History:  Diagnosis Date   Anxiety    Phreesia 06/09/2020   Chronic back pain    Depression    Phreesia 06/09/2020   Diabetes mellitus without complication ([place])    Gastroparesis due to DM ([place])    GERD (gastroesophageal reflux disease)    Phreesia 06/09/2020   Hyperlipemia    Hyperlipidemia    Phreesia 06/09/2020   Hypertension    IBS (irritable bowel syndrome)    Sinusitis, chronic 01/17/2019   Sleep apnea    Phreesia 06/09/2020   Past Surgical History:  Procedure Laterality Date   CHOLECYSTECTOMY     ENDOMETRIAL ABLATION     GALLBLADDER SURGERY     TUBAL LIGATION     Social History   Social History   Marital status: Married    Spouse name: Kelsey Vang    Number of children: 1   Occupational History   CMA   Social History Main Topics   Smoking status: Current Every Day Smoker    Packs/day: 1.00    Types: Cigarettes   Smokeless tobacco: Never Used   Alcohol use No   Drug use: No   Current Outpatient Medications on File Prior to Visit  Medication Sig Dispense Refill   ACCU-CHEK FASTCLIX LANCETS MISC Use to check sugars 2-3 times daily 102 each 11   busPIRone (BUSPAR) 15 MG tablet Take 15 mg by mouth 2 (two) times daily.     cetirizine (ZYRTEC) 10 MG tablet Take 10 mg by mouth daily.     Cholecalciferol (VITAMIN D) 2000 units CAPS Take 1 capsule by mouth daily.     Continuous Blood Gluc Receiver (FREESTYLE LIBRE 2 READER) DEVI 1 each by Does not apply route daily. 1 each 0   Continuous Blood Gluc Sensor (FREESTYLE LIBRE 2 SENSOR) MISC 1 each by Does not apply route every 14 (fourteen) days. 6 each  3   cyclobenzaprine (FLEXERIL) 10 MG tablet Take 10 mg by mouth at bedtime.     diclofenac sodium (VOLTAREN) 1 % GEL Apply 2 g topically 4 (four) times daily. 100 g 0   escitalopram (LEXAPRO) 10 MG tablet Take 10 mg by mouth daily.     ezetimibe (ZETIA) 10 MG tablet Take 10 mg by mouth daily.     FARXIGA 5 MG TABS tablet Take 1 tablet (5 mg total) by mouth daily. 90 tablet 1   fluticasone (FLONASE) 50 MCG/ACT nasal spray Place 2 sprays into both nostrils daily. 16 g 6   glucose blood (  ACCU-CHEK GUIDE) test strip Use as instructed 100 each 12   insulin aspart (NOVOLOG) 100 UNIT/ML injection Inject 10-15 Units into the skin 3 (three) times daily before meals. 40 mL 0   insulin glargine (LANTUS) 100 UNIT/ML injection Inject 0.5 mLs (50 Units total) into the skin at bedtime. Pens please. 45 mL 3   meloxicam (MOBIC) 15 MG tablet Take 15 mg by mouth daily.     Omega-3 Fatty Acids (FISH OIL) 1000 MG CAPS Take 1 capsule by mouth 2 (two) times daily.     omeprazole (PRILOSEC) 40 MG capsule Take 40 mg by mouth daily.     ondansetron (ZOFRAN) 4 MG tablet Take 1 tablet (4 mg total) by mouth every 8 (eight) hours as needed for nausea or vomiting. 20 tablet 0   pantoprazole (PROTONIX) 40 MG tablet Take 40 mg by mouth 2 (two) times daily.     pregabalin (LYRICA) 150 MG capsule Take 75 mg by mouth 2 (two) times daily.     QUEtiapine (SEROQUEL) 100 MG tablet Take half tablet for one week then increase to one tablet by mouth at bedtime 30 tablet 3   Semaglutide, 1 MG/DOSE, (OZEMPIC, 1 MG/DOSE,) 4 MG/3ML SOPN Inject 1 mg into the skin once a week. 9 mL 3   UNIFINE PENTIPS 32G X 4 MM MISC USE TO INJECT INSULIN 4 TIMES DAILY 400 each 5   No current facility-administered medications on file prior to visit.   Allergies  Allergen Reactions   Other Diarrhea and Rash   Gabapentin Other (See Comments)   Levemir [Insulin Detemir] Dermatitis    Whelps at injection site   Metoclopramide Other (See Comments)     headache   Keflex [Cephalexin] Rash   Prednisone Palpitations    oral   Family History  Problem Relation Age of Onset   Cancer Mother    Diabetes Father    Liver disease Father    Heart attack Maternal Grandmother    Cancer Paternal Grandfather    PE: BP 122/78 (BP Location: Left Arm, Patient Position: Sitting, Cuff Size: Normal)   Pulse 94   Ht _0  (1.575 m)   Wt 263 lb 6.4 oz (119.5 kg)   SpO2 96%   BMI 48.18 kg/m  Wt Readings from Last 3 Encounters:  01/29/21 263 lb 6.4 oz (119.5 kg)  01/07/21 254 lb (115.2 kg)  08/06/20 276 lb (125.2 kg)   Constitutional: overweight, in NAD Eyes: PERRLA, EOMI, no exophthalmos ENT: moist mucous membranes, no thyromegaly, no cervical lymphadenopathy Cardiovascular: tachycardia, RR, No MRG Respiratory: CTA B Gastrointestinal: abdomen soft, NT, ND, BS+ Musculoskeletal: no deformities, strength intact in all 4 Skin: moist, warm, no rashes Neurological: no tremor with outstretched hands, DTR normal in all 4  ASSESSMENT: 1. DM2, insulin-dependent, uncontrolled, without long term complications, but with hyper and hypoglycemia -History of pancreatitis -Gastroparesis -Peripheral neuropathy  2. HL  3.  Obesity class III   PLAN:  1. Patient with longstanding, uncontrolled, type 2 diabetes, on oral antidiabetic regimen with SGLT2 inhibitor, also basal-bolus insulin regimen and weekly GLP-1 receptor agonist -started 2 weeks before last visit.  Before last visit, she was able to obtain a freestyle libre 2 CGM.  Reviewing the trends, it appeared that her sugars were increasing in the second half of the night, peaking around 6 AM and they were again high after breakfast and dinner.  Some of the high blood sugars were related to steroid injections.  At that time, I advised  her to increase Ozempic dose.  HbA1c was 9.6% before our last visit. CGM interpretation: -At today's visit, we reviewed her CGM downloads: It appears that 58% of values are in  target range (goal >70%), while 42% are higher than 180 (goal <25%), and 0% are lower than 70 (goal <4%).  The calculated average blood sugar is 174.  The projected HbA1c for the next 3 months (GMI) is 7.5%. -Reviewing the CGM trends, it appears that her sugars are higher than target usually, but less more significant increases in blood sugars around 2 AM, due to her increase in pain and also after breakfast and dinner.  This may be related to her recent steroid courses.  For now, I advised her to increase the dose of NovoLog slightly. -She is complaining about nausea after the 1 mg Ozempic dose administration so we discussed about backing off to approximately 0.5 mg twice weekly.  I advised her that if she cannot dial down the dose on her current plan, to let me know, so I can send a prescription for 0.5 mg of Ozempic to her pharmacy.  We did discuss about the mechanism of action of Ozempic and the fact that we can slow down her gastric emptying.  However, if she can tolerate the lower dose well, I recommended that she continues with it due to all the other benefits of Ozempic including reducing cardiovascular outcomes. - I suggested to:  Patient Instructions  Please continue: - Farxiga 5 mg before b'fast - Lantus 15-20 units in am and 30 units at bedtime  Change: - Ozempic 0.5 mg 2x weekly  Please increase: - NovoLog 15-20 units before each meal  Please return in 3 months.  - we checked her HbA1c: 8.9% (better) - advised to check sugars at different times of the day - 4x a day, rotating check times - advised for yearly eye exams >> she is not UTD - return to clinic in 3 months  2. HL - reviewed latest lipid panel from 06/2020: Very high triglycerides and LDL -She continues on Zetia 10 mg daily and omega-3 fatty acids.  She tolerates these well.  She could not tolerate Lipitor.  3. Obesity class III -continue SGLT 2 inhibitor and GLP-1 receptor agonist which should also help with weight  loss -In the year prior to last visit, she felt she gained approximately 30 pounds most likely due to steroid injections and being inactive due to back pain -At this visit, she lost more than 20 pounds since last visit after starting Ozempic, but gained 9 lbs back in last mo -increased pain, steroids, antibiotics  Philemon Kingdom, MD PhD [facility]

## 2021-02-21 NOTE — Progress Notes (Signed)
NEUROLOGY FOLLOW UP OFFICE NOTE  Kelsey Vang [id number]  Assessment/Plan:   Left optic neuritis - primary concern would be presenting symptoms of multiple sclerosis  1  Will order MRI of brain/orbits with and without contrast STAT so I can see what is going on and can then order her 5 day course of IV Solu-Medrol 1000mg  daily. 2  Will need to later check MRI of cervical and thoracic spine with and without contrast 3  Check serum ANA, sed rate, B12, NMO antibody 4  May likely order LP for CSF analysis 5  Follow up after testing  Subjective:  Kelsey Vang is a 45 year old female with diabetes whom I previously saw for migraines presents today for left optic neuritis.  History supplemented by optometry note.  She is accompanied by her husband.  Two weeks ago, she reports blurred vision followed by darkening vision in the lower half of visual field her left eye.  No associated eye pain but she is on Lyrica for chronic back and leg pain.  She reports intermittent burst of blue and red colors in the left eye, and vision looks red with faded bluish green with eyes closed.  She went to her optometrist last week whose exam revealed left optic neuritis and disc hemorrhage with full visual fields and no APD.    She has history of chronic back pain with right sided sciatica and sometimes intermittent pain down the left leg.  She is treated by pain medicine.  She takes high dose of Lyrica.  MRI of lumbar spin from 06/03/2020 showed mild congenital spinal canal narrowing with mild degenerative disc disease and facet arthrosis including mild left neuroforaminal stenosis adjacent to the let exiting nerve root at L4-5.  No neck pain.  PAST MEDICAL HISTORY: Past Medical History:  Diagnosis Date   Anxiety    Phreesia 06/09/2020   Chronic back pain    Depression    Phreesia 06/09/2020   Diabetes mellitus without complication ([facility])    Gastroparesis due to DM ([facility])    GERD (gastroesophageal reflux  disease)    Phreesia 06/09/2020   Hyperlipemia    Hyperlipidemia    Phreesia 06/09/2020   Hypertension    IBS (irritable bowel syndrome)    Sinusitis, chronic 01/17/2019   Sleep apnea    Phreesia 06/09/2020    MEDICATIONS: Current Outpatient Medications on File Prior to Visit  Medication Sig Dispense Refill   ACCU-CHEK FASTCLIX LANCETS MISC Use to check sugars 2-3 times daily 102 each 11   busPIRone (BUSPAR) 15 MG tablet Take 15 mg by mouth 2 (two) times daily.     cetirizine (ZYRTEC) 10 MG tablet Take 10 mg by mouth daily.     Cholecalciferol (VITAMIN D) 2000 units CAPS Take 1 capsule by mouth daily.     Continuous Blood Gluc Receiver (FREESTYLE LIBRE 2 READER) DEVI 1 each by Does not apply route daily. 1 each 0   Continuous Blood Gluc Sensor (FREESTYLE LIBRE 2 SENSOR) MISC 1 each by Does not apply route every 14 (fourteen) days. 6 each 3   cyclobenzaprine (FLEXERIL) 10 MG tablet Take 10 mg by mouth at bedtime.     diclofenac sodium (VOLTAREN) 1 % GEL Apply 2 g topically 4 (four) times daily. 100 g 0   escitalopram (LEXAPRO) 10 MG tablet Take 10 mg by mouth daily.     ezetimibe (ZETIA) 10 MG tablet Take 10 mg by mouth daily.     FARXIGA 5  MG TABS tablet Take 1 tablet (5 mg total) by mouth daily. 90 tablet 1   fluticasone (FLONASE) 50 MCG/ACT nasal spray Place 2 sprays into both nostrils daily. 16 g 6   glucose blood (ACCU-CHEK GUIDE) test strip Use as instructed 100 each 12   HYDROcodone-acetaminophen (NORCO) 10-325 MG tablet Take 1 tablet by mouth. 1 tablet 3 times daily     insulin aspart (NOVOLOG) 100 UNIT/ML injection Inject 15-20 Units into the skin 3 (three) times daily before meals. 40 mL 3   insulin glargine (LANTUS) 100 UNIT/ML injection Pens please. Use 20 units in am and 30 units at night under skin 45 mL 3   meloxicam (MOBIC) 15 MG tablet Take 15 mg by mouth daily.     Omega-3 Fatty Acids (FISH OIL) 1000 MG CAPS Take 1 capsule by mouth 2 (two) times daily.     omeprazole  (PRILOSEC) 40 MG capsule Take 40 mg by mouth daily.     ondansetron (ZOFRAN) 4 MG tablet Take 1 tablet (4 mg total) by mouth every 8 (eight) hours as needed for nausea or vomiting. 20 tablet 0   pantoprazole (PROTONIX) 40 MG tablet Take 40 mg by mouth 2 (two) times daily.     pregabalin (LYRICA) 150 MG capsule Take 75 mg by mouth 2 (two) times daily.     QUEtiapine (SEROQUEL) 100 MG tablet Take half tablet for one week then increase to one tablet by mouth at bedtime 30 tablet 3   Semaglutide, 1 MG/DOSE, (OZEMPIC, 1 MG/DOSE,) 4 MG/3ML SOPN Inject 1 mg into the skin once a week. 9 mL 3   UNIFINE PENTIPS 32G X 4 MM MISC USE TO INJECT INSULIN 4 TIMES DAILY 400 each 5   No current facility-administered medications on file prior to visit.    ALLERGIES: Allergies  Allergen Reactions   Other Diarrhea and Rash   Gabapentin Other (See Comments)   Levemir [Insulin Detemir] Dermatitis    Whelps at injection site   Metoclopramide Other (See Comments)    headache   Keflex [Cephalexin] Rash   Prednisone Palpitations    oral    FAMILY HISTORY: Family History  Problem Relation Age of Onset   Cancer Mother    Diabetes Father    Liver disease Father    Heart attack Maternal Grandmother    Cancer Paternal Grandfather       Objective:  Blood pressure 117/80, pulse 92, height 5\' 2"  (1.575 m), weight 263 lb 9.6 oz (119.6 kg), SpO2 96 %. General: No acute distress.  Patient appears well-groomed.   Head:  Normocephalic/atraumatic Eyes:  Fundi examined but not visualized Neck: supple, no paraspinal tenderness, full range of motion Heart:  Regular rate and rhythm Lungs:  Clear to auscultation bilaterally Back: bilateral paraspinal tenderness Neurological Exam: alert and oriented to person, place, and time.  Speech fluent and not dysarthric, language intact.  Visual defect lower half of left eye visual field.  Otherwise, CN II-XII intact. Bulk and tone normal, muscle strength 5/5 throughout.   Sensation to light touch intact.  Deep tendon reflexes 2+ throughout, toes downgoing.  Finger to nose testing intact.  Broad-based antalgic gait.  Romberg with sway.   , DO  CC: [facility]

## 2021-02-22 ENCOUNTER — Ambulatory Visit (HOSPITAL_COMMUNITY): Payer: 59

## 2021-02-22 ENCOUNTER — Other Ambulatory Visit: Payer: Self-pay

## 2021-02-22 ENCOUNTER — Telehealth: Payer: Self-pay | Admitting: Neurology

## 2021-02-22 ENCOUNTER — Encounter: Payer: Self-pay | Admitting: Neurology

## 2021-02-22 ENCOUNTER — Ambulatory Visit (HOSPITAL_COMMUNITY)
Admission: RE | Admit: 2021-02-22 | Discharge: 2021-02-22 | Disposition: A | Discharge status: Home / Self Care | Payer: 59 | Source: Ambulatory Visit | Attending: Neurology | Admitting: Neurology

## 2021-02-22 ENCOUNTER — Other Ambulatory Visit: Payer: Self-pay | Admitting: Neurology

## 2021-02-22 ENCOUNTER — Other Ambulatory Visit (INDEPENDENT_AMBULATORY_CARE_PROVIDER_SITE_OTHER): Payer: 59

## 2021-02-22 ENCOUNTER — Ambulatory Visit (INDEPENDENT_AMBULATORY_CARE_PROVIDER_SITE_OTHER): Payer: 59 | Admitting: Neurology

## 2021-02-22 VITALS — BP 117/80 | HR 92 | Ht 62.0 in | Wt 263.6 lb

## 2021-02-22 DIAGNOSIS — H469 Unspecified optic neuritis: Secondary | ICD-10-CM | POA: Insufficient documentation

## 2021-02-22 DIAGNOSIS — G35 Multiple sclerosis: Secondary | ICD-10-CM | POA: Insufficient documentation

## 2021-02-22 LAB — SEDIMENTATION RATE: Sed Rate: 44 mm/hr — ABNORMAL HIGH (ref 0–20)

## 2021-02-22 LAB — VITAMIN B12: Vitamin B-12: 438 pg/mL (ref 211–911)

## 2021-02-22 MED ORDER — GADOBUTROL 1 MMOL/ML IV SOLN
10.0000 mL | Freq: Once | INTRAVENOUS | Status: AC | PRN
  Administered 2021-02-22: 10 mL via INTRAVENOUS

## 2021-02-22 NOTE — Telephone Encounter (Signed)
Patient called and her infusion location will be [facility] on [address].

## 2021-02-22 NOTE — Addendum Note (Signed)
Addended by: Allean Found R on: 02/22/2021 01:58 PM   Modules accepted: Orders

## 2021-02-22 NOTE — Patient Instructions (Addendum)
Will check MRI of brain and orbits with and without contrast as well as MRI of cervical and thoracic spine with and without contrast.  After this is performed, will set you up for 5 day course of  IV steroids to help reduce eye inflammation Check ANA, sed rate, B12, NMO antibodies Will likely check a spinal tap.   Will have you follow up soon after testing to discuss next step.

## 2021-02-23 ENCOUNTER — Other Ambulatory Visit: Payer: Self-pay

## 2021-02-23 ENCOUNTER — Other Ambulatory Visit: Payer: Self-pay | Admitting: Neurology

## 2021-02-23 ENCOUNTER — Telehealth: Payer: Self-pay | Admitting: Pharmacy Technician

## 2021-02-23 DIAGNOSIS — H469 Unspecified optic neuritis: Secondary | ICD-10-CM

## 2021-02-23 DIAGNOSIS — G35 Multiple sclerosis: Secondary | ICD-10-CM

## 2021-02-23 NOTE — Telephone Encounter (Signed)
Pieter Partridge, DO to Kelsey Vang, CMA     7:38 AM Before starting any treatment, I would like for her to first be seen by ophthalmology urgently to confirm optic neuritis as the MRI of the optic nerve was completely normal.   Auth Submission: no auth needed/approved Payer: [employer] Medication & CPT/J Code(s) submitted: Solumedrol (Methylprednisolone) [id number] Route of submission (phone, fax, portal): phone: [phone] Auth type: Buy/Bill Units/visits requested: 3 Reference number: [id number] Approval from: 03/05/21 to 03/17/21  Deductible: none OOP: $2400 which has been met.  (Insurance will term 03/17/21, will need to verify insurance again before tx)

## 2021-02-23 NOTE — Telephone Encounter (Signed)
Pt advise of Dr.Jaffe note, Before starting any treatment, I would like for her to first be seen by ophthalmology urgently to confirm optic neuritis as the MRI of the optic nerve was completely normal.   Ophthalmology referral added.

## 2021-02-23 NOTE — Telephone Encounter (Signed)
Patient called and wants to know if Dr. Everlena Cooper is going order more MRI's? She wants put her glucose monitor back on.

## 2021-02-23 NOTE — Telephone Encounter (Signed)
Pt advised MRI Cervical and thoracic Spine have to be done within the next week.   Pt states she will old off.

## 2021-02-24 ENCOUNTER — Other Ambulatory Visit: Payer: Self-pay

## 2021-02-24 ENCOUNTER — Ambulatory Visit (HOSPITAL_COMMUNITY)
Admission: RE | Admit: 2021-02-24 | Discharge: 2021-02-24 | Disposition: A | Discharge status: Home / Self Care | Payer: 59 | Source: Ambulatory Visit | Attending: Neurology | Admitting: Neurology

## 2021-02-24 ENCOUNTER — Other Ambulatory Visit: Payer: 59

## 2021-02-24 ENCOUNTER — Telehealth: Payer: Self-pay | Admitting: Neurology

## 2021-02-24 ENCOUNTER — Encounter: Payer: Self-pay | Admitting: Neurology

## 2021-02-24 DIAGNOSIS — G35 Multiple sclerosis: Secondary | ICD-10-CM

## 2021-02-24 DIAGNOSIS — H469 Unspecified optic neuritis: Secondary | ICD-10-CM

## 2021-02-24 LAB — NEUROMYELITIS OPTICA AUTOAB, IGG: NMO IgG Autoantibodies: <1.5 U/mL (ref 0.0–3.0)

## 2021-02-24 MED ORDER — GADOBUTROL 1 MMOL/ML IV SOLN
10.0000 mL | Freq: Once | INTRAVENOUS | Status: AC | PRN
  Administered 2021-02-24: 10 mL via INTRAVENOUS

## 2021-02-24 NOTE — Telephone Encounter (Signed)
Advised pt I did speak to [facility], And they are booked out not matter if the referral is urgent or not.  Rep did give [facility] provider through [place] number [phone] Dr. Hardin Negus.   [facility] is booked out for a couple of Weeks.

## 2021-02-24 NOTE — Telephone Encounter (Signed)
Patient stopped by to sign forms for Acthar Gel and get labs drawn per dr.Jaffe.  Will start PA for medication on 02/25/21  Pt unable to see Dr.Digby office not in network with her insurance.  Will send referral Dr.[facility]. Faxed over today.

## 2021-02-24 NOTE — Telephone Encounter (Signed)
Patient called and said [facility] has no openings until February or March.  [facility] takes her insurance so maybe they can see her sooner, she said.   Patient sated the referral is urgent.  She said the tissue under her eye is starting to swell.

## 2021-02-24 NOTE — Telephone Encounter (Signed)
Pt wanted to let Kelsey Vang know she is on the way

## 2021-02-24 NOTE — Telephone Encounter (Signed)
Pt advised of DRJaffe note,  I really can't explain the swelling under the eye.  That is why she really needs to see an ophthalmologist.  If it is going to take at least 2 weeks to see one, then I want to go ahead and treat with the Acthar gel injection - 80mg  daily for 2 weeks, followed by 30mg  daily for 3 days, 15mg  daily for 3 days, 10mg  daily for 3 days, then STOP.  I would like to add a couple of other blood tests as well - ACE and vitamin A.      Will send a new referral to [facility].     when you get a chance please check patient insurance for coverage of the Acthar gel. ( Patient has an allergy to Prednisone

## 2021-02-24 NOTE — Telephone Encounter (Signed)
Pt stated the referral was sent over for her, but they dont have appts till feb or march. Wanted to know if that was okay or does she need to be seen else where

## 2021-02-25 ENCOUNTER — Telehealth: Payer: Self-pay

## 2021-02-25 ENCOUNTER — Other Ambulatory Visit: Payer: Self-pay | Admitting: Neurology

## 2021-02-25 LAB — ANA: Anti Nuclear Antibody (ANA): NEGATIVE

## 2021-02-25 MED ORDER — ACTHAR 80 UNIT/ML IJ GEL: INTRAMUSCULAR | 0 refills | Status: DC

## 2021-02-25 NOTE — Addendum Note (Signed)
Addended by: Leida Lauth on: 02/25/2021 09:53 AM   Modules accepted: Orders

## 2021-02-25 NOTE — Progress Notes (Signed)
Tried caling pt to advise. No answer. LMOVM.

## 2021-02-25 NOTE — Progress Notes (Signed)
There is some movement artifact but no spinal cord abnormalities are seen.  I would like to proceed with the lumbar puncture - check CSF cell count, protein, glucose, oligoclonal bands, IgG index, gram stain and culture, cytology, ACE

## 2021-02-25 NOTE — Telephone Encounter (Signed)
New message   Your information has been sent to [employer].  Coralie Carpen (Key: Davina Poke) Acthar 80UNIT/ML gel   Form [employer] ePA Form 2017 NCPDP Created 7 minutes ago Sent to Plan 6 minutes ago Plan Response 6 minutes ago Submit Clinical Questions 1 minute ago Determination Wait for Determination Please wait for [employer] 2017 to return a determination.

## 2021-02-25 NOTE — Addendum Note (Signed)
Addended by: Leida Lauth on: 02/25/2021 02:48 PM   Modules accepted: Orders

## 2021-02-25 NOTE — Telephone Encounter (Signed)
Kelsey Vang (Key: Davina Poke) Acthar 80UNIT/ML gel   Form [employer] ePA Form 2017 NCPDP Created 7 minutes ago Sent to Plan 6 minutes ago Plan Response 6 minutes ago Submit Clinical Questions 1 minute ago Determination Wait for Determination Please wait for [employer] 2017 to return a determination.

## 2021-02-25 NOTE — Progress Notes (Signed)
Spoke to patient, PA started for LP. No PA is Required.   Message sent to [facility] in [employer]

## 2021-02-25 NOTE — Telephone Encounter (Signed)
Patient called and said she was returning a call to [facility].

## 2021-02-27 LAB — VITAMIN A: Vitamin A (Retinoic Acid): 64 ug/dL (ref 38–98)

## 2021-02-27 LAB — ANGIOTENSIN CONVERTING ENZYME: Angiotensin-Converting Enzyme: 36 U/L (ref 9–67)

## 2021-03-01 ENCOUNTER — Ambulatory Visit (HOSPITAL_COMMUNITY)
Admission: RE | Admit: 2021-03-01 | Discharge: 2021-03-01 | Disposition: A | Discharge status: Home / Self Care | Payer: 59 | Source: Ambulatory Visit | Attending: Neurology | Admitting: Neurology

## 2021-03-01 ENCOUNTER — Other Ambulatory Visit: Payer: Self-pay

## 2021-03-01 DIAGNOSIS — G35 Multiple sclerosis: Secondary | ICD-10-CM | POA: Diagnosis not present

## 2021-03-01 LAB — CSF CELL COUNT WITH DIFFERENTIAL
RBC Count, CSF: 1270 /mm3 — ABNORMAL HIGH
Tube #: 3
WBC, CSF: 3 /mm3 (ref 0–5)

## 2021-03-01 LAB — PROTEIN, CSF: Total  Protein, CSF: 80 mg/dL — ABNORMAL HIGH (ref 15–45)

## 2021-03-01 LAB — GLUCOSE, CSF: Glucose, CSF: 94 mg/dL — ABNORMAL HIGH (ref 40–70)

## 2021-03-01 MED ORDER — LIDOCAINE HCL (PF) 1 % IJ SOLN
5.0000 mL | Freq: Once | INTRAMUSCULAR | Status: AC
  Administered 2021-03-01: 5 mL via INTRADERMAL

## 2021-03-01 NOTE — Procedures (Signed)
Technically successful L3/L4 image-guided lumbar puncture yielding 12 ml CSF for laboratory studies. Please see full dictation under Imaging in Epic.  Alwyn Ren, [place] [phone] 03/01/2021, 10:23 AM

## 2021-03-02 ENCOUNTER — Telehealth: Payer: Self-pay | Admitting: Neurology

## 2021-03-02 LAB — VDRL, CSF: VDRL Quant, CSF: NONREACTIVE

## 2021-03-02 NOTE — Telephone Encounter (Signed)
Pt said she is returning a call to someone 

## 2021-03-02 NOTE — Progress Notes (Signed)
LMOVM for pt to call the office back.

## 2021-03-02 NOTE — Telephone Encounter (Signed)
Pt said  she needs a call back from sheena regarding the sample medication jaffe wants her to take

## 2021-03-03 LAB — SPECIMEN STATUS REPORT

## 2021-03-03 LAB — CYTOLOGY - NON PAP

## 2021-03-03 NOTE — Telephone Encounter (Signed)
Per pt she do not qualify for the Assistance or gap program for Acthar.  Pt states she has to get samples or the insurance has to cover or she will not be able to start.

## 2021-03-03 NOTE — Telephone Encounter (Signed)
Advised pt of dr.Jaffe note, We can give her the sample for the 2 weeks.  The taper is not necessary.  We have one here at the office the patient may come by and get.   The second one is on it way

## 2021-03-04 LAB — HSV 1/2 PCR, CSF
HSV-1 DNA: NEGATIVE
HSV-2 DNA: NEGATIVE

## 2021-03-04 LAB — ANGIOTENSIN CONVERTING ENZYME, CSF: Angio Convert Enzyme: <1.5 U/L (ref 0.0–2.5)

## 2021-03-04 LAB — CSF CULTURE W GRAM STAIN: Culture: NO GROWTH

## 2021-03-04 LAB — OLIGOCLONAL BANDS, CSF + SERM

## 2021-03-04 LAB — CSF IGG: IgG, CSF: 3 mg/dL (ref 0.0–6.7)

## 2021-03-08 ENCOUNTER — Encounter: Payer: Self-pay | Admitting: Neurology

## 2021-03-08 NOTE — Telephone Encounter (Signed)
F/u   Norwood Levo (Key: [id number]) Acthar 80UNIT/ML gel   Form MedImpact ePA Form 2017 NCPDP Created 11 days ago Sent to Plan 11 days ago Plan Response 11 days ago Submit Clinical Questions 11 days ago Determination Unfavorable 10 days ago eAppeal Submitted eAppeal Determination Your prior authorization request has been denied. COMPLETE E-APPEAL Your request for prior authorization was denied, but an Electronic Appeal is available for your patient. Complete the questions in the Appeal section at the bottom of this page to pursue the appeal. For assistance, contact our support team at [phone].  Message from plan: This request has not been approved. Based on the information submitted for review, you did not meet our guideline rules for the requested drug. In order for your request to be approved, your provider would need to show that you have met the guideline rules below. The details below are written in medical language. If you have questions, please contact your provider. In some cases, the requested medication or alternatives offered may have additional approval requirements. Our guideline named CORTICOTROPIN (Acthar HP) requires that you have ONE of the following diagnoses: 1. Infantile Spasm (a type of seizure disorder in infancy and childhood), 2. Acute exacerbation (sudden worsening) of multiple sclerosis (a type of nerve disorder), 3. Nephrotic Syndrome (a type of kidney disorder), 4. Dermatomyositis/Polymyositis (a type of muscle and skin disorder),5. Systemic Lupus Erythematosus (SLE: a type of immune condition), 6. Rheumatoid Arthritis (a type of joint condition), 7. Any other Food and Drug Administration (FDA) approved indication. In addition, the following rule(s) must be met for approval: A. You are not concomitantly using (using at the same time) a live or live attenuated vaccine when receiving immunosuppressive corticotropin dose B. If you have acute exacerbation of multiple  sclerosis, approval also requires: 1. You have a history of inadequate response to, or an intolerance to an IV corticosteroid for current or previous acute exacerbation (sudden worsening of symptoms). Your provider told us that this medication is being requested for the treatment of acute exacerbation of multiple sclerosis (MS) and that you are currently stable within the last 30 days on an immunomodulator agent (such as dimethyl fumarate, glatiramer, interferon beta 1a, interferon beta 1b, fingolimod, natalizumab, teriflunomide, unless contraindicated). We do not have information showing that you met the conditions listed above. This is why you request is denied. Please follow up with y

## 2021-03-08 NOTE — Progress Notes (Signed)
Pt advised of labs and Spinal Fluid results.  Please answer [employer] message from earlier today.  Call [facility] with [facility]chtar Gel to see if patient sample coming before we close on Wednesday at 2 pm.  No answer LMOVM.

## 2021-03-09 ENCOUNTER — Encounter: Payer: Self-pay | Admitting: Neurology

## 2021-03-16 ENCOUNTER — Encounter: Payer: Self-pay | Admitting: Neurology

## 2021-03-19 ENCOUNTER — Other Ambulatory Visit: Payer: Self-pay | Admitting: Pharmacy Technician

## 2021-04-23 ENCOUNTER — Other Ambulatory Visit: Payer: Self-pay | Admitting: Internal Medicine

## 2021-04-23 ENCOUNTER — Telehealth: Payer: Self-pay | Admitting: Internal Medicine

## 2021-04-23 MED ORDER — BASAGLAR KWIKPEN 100 UNIT/ML ~~LOC~~ SOPN: PEN_INJECTOR | SUBCUTANEOUS | 3 refills | Status: DC

## 2021-04-23 NOTE — Telephone Encounter (Signed)
Called and left a detailed message for pt advising alternative sent to [facility].

## 2021-04-23 NOTE — Telephone Encounter (Signed)
I just sent Basaglar to [facility].

## 2021-04-23 NOTE — Telephone Encounter (Signed)
Patient called to advise that her Lantus is no longer covered under her new insurance. She has [employer] now.  Patient states that her insurance company faxed Dr Elvera Lennox a page with a list of the medications that Kelsey Vang covers in place of Lantus.  Patient advises that she is completely out of Lantus and needs advise on how to proceed without Lantus.   Call back number is [phone]

## 2021-05-04 ENCOUNTER — Encounter: Payer: Self-pay | Admitting: Neurology

## 2021-05-05 NOTE — Progress Notes (Deleted)
NEUROLOGY FOLLOW UP OFFICE NOTE  Kelsey Vang [id number]  Assessment/Plan:   Vision loss in left eye with reported findings of optic neuritis by optometry.  Objective workup has been negative for optic neuritis.  No evidence of optic neuritis on MRI.  Mild white matter hyperintensities noted on MRI of brain which are nonspecific and may be attributed to migraines and diabetes.  Overall, unremarkable.  No evidence of demyelinating disease involving spinal cord.  CSF analysis negative for any neurologic inflammatory/autoimmune disease.  Protein and glucose slightly elevated, likely due to diabetes.  Vision also did not respond to steroids.  At this point, I need a thorough eye exam from an ophthalmologist to determine if there is any true evidence of optic neuritis.   Migraine without aura, without status migrainosus, not intractable  Awaiting upcoming ophthalmology evaluation.   Migraine prevention:  *** Migraine rescue:  *** Limit use of pain relievers to no more than 2 days out of week to prevent risk of rebound or medication-overuse headache. Keep headache diary Follow up ***   Subjective:  Kelsey Vang is a 46 year old female with diabetes and hyperlipidemia who follows up for migraines and left optic neuritis.   UPDATE: Visual Disturbance/Possible Left Optic Neuritis: MRI of brain and orbits with and without contrast on 02/22/2021 personally reviewed showed mild nonspecific  periventricular and juxtacortical T2/FLAIR hyperintensities with no obvious enhancement or abnormality of the optic nerves.  Despite unremarkable imaging, she received 3 day course of IV Solu-Medrol.  She continued workup for demyelinating disease.  MRI of cervical and thoracic spine with and without contrast on 02/24/2021 showed no cord signal abnormalities.  She underwent LP on 03/01/2021 for CSF analysis which revealed cell count 3, protein 80, glucose 94, no oligoclonal bands, IgG 3, negative ACE,  nonreactive VDRL, negative gram stain and culture, negative cytometry.  Serum labs from 11/7 &12/2020 included negative ANA, sed rate 44, negative NMO IgG ab, B12 438, ACE 36, and vit A 64.  She reported no improvement in vision following Solu-Medrol (advised to monitor glucose), so she was started on Acthar gel but stopped prematurely due to causing pain and unable to obtain further vials to complete the course.  Still no improvement in vision.  She then noticed swelling under the left eye.  Recommended seeing ophthalmology but ***.  She finally has an upcoming appointment with Dr. Katy Fitch ***  Migraines: Intensity:  severe Duration:  1 day Frequency:  2 migraines Still has a daily dull headache Frequency of abortive medication: Excedrin 3 days a week. Current NSAIDS: no Current analgesics: Excedrin Migraine Current triptans: Sumatriptan 100 mg Current ergotamine: None Current anti-emetic: None Current muscle relaxants: Zanflex 2mg  (just prescribed today for back pain) Current anti-anxiolytic: None Current sleep aide: None Current Antihypertensive medications: Metoprolol, lisinopril Current Antidepressant medications: sertraline Current Anticonvulsant medications: None Current anti-CGRP:none Current Vitamins/Herbal/Supplements: None Current Antihistamines/Decongestants: Flonase, Zyrtec Other therapy: None Hormone/birth control: none   Caffeine:  No coffee.  Will drink caffeine with headache Alcohol: No Smoker: No Diet: Hydrates.  Does not skip meals Exercise: Walks Depression: No; Anxiety: No Other pain: back pain  Sleep hygiene: Poor.  Has OSA and compliant with CPAP   HISTORY: Visual Disturbance/Possible Left Optic Neuritis: In October 2022, she developed blurred vision followed by darkening vision in the lower half of visual field her left eye.  No associated eye pain but she is on Lyrica for chronic back and leg pain.  She reports intermittent burst of blue and  red colors in the  left eye, and vision looks red with faded bluish green with eyes closed.  She went to her optometrist last week whose exam revealed left optic neuritis and disc hemorrhage with full visual fields and no APD.     Migraines: Onset: Since late teens. Increased frequency for past year. Location:  diffuse Quality:  "lightening bolt" Initial intensity:  Severe..  Sometimes wakes her from sleep.  She denies new headache. Aura:  Occasionally may see flashing lights when her eyes are closed.  Difficult to focus Prodrome:  no Postdrome:  tired Associated symptoms: Nausea, vomiting, photophobia, phonophobia, osmophobia, blurred vision.  No autonomic symptoms.  She denies associated unilateral numbness or weakness. Initial duration:  With treatment, 2 hours severe but less severe for entire day (sometimes 2 days). Initial Frequency:  1 to 2 days a week but has constant dull left frontal/top headache Initial Frequency of abortive medication: ibuprofen 3 days a week.  Sumatriptan 1 to 2 days a week. Triggers: None Relieving factors:  rest Activity:  Cannot function at all once every few months.   Past NSAIDS:  no Past analgesics:  Tylenol, Fioricet Past abortive triptans:  Maxalt Past muscle relaxants:  Flexeril Past anti-emetic:  no Past antihypertensive medications:  no Past antidepressant medications:   Amitriptyline 100 mg (ineffective, cause palpitations) Past anticonvulsant medications:  Lyrica, topiramate (hallucinations), gabapentin (hallucinations) Past anti-CGRP:  Ajovy (insurance issue) Past vitamins/Herbal/Supplements:  no Past antihistamines/decongestants:  no Other past therapies:  no   Family history of headache:  mother  PAST MEDICAL HISTORY: Past Medical History:  Diagnosis Date   Anxiety    Phreesia 06/09/2020   Chronic back pain    Depression    Phreesia 06/09/2020   Diabetes mellitus without complication ([place])    Gastroparesis due to DM ([place])    GERD  (gastroesophageal reflux disease)    Phreesia 06/09/2020   Hx of migraines    Hyperlipemia    Hyperlipidemia    Phreesia 06/09/2020   Hypertension    IBS (irritable bowel syndrome)    Sinusitis, chronic 01/17/2019   Sleep apnea    Phreesia 06/09/2020    MEDICATIONS: Current Outpatient Medications on File Prior to Visit  Medication Sig Dispense Refill   ACCU-CHEK FASTCLIX LANCETS MISC Use to check sugars 2-3 times daily (Patient not taking: Reported on 02/22/2021) 102 each 11   busPIRone (BUSPAR) 15 MG tablet Take 15 mg by mouth 2 (two) times daily.     cetirizine (ZYRTEC) 10 MG tablet Take 10 mg by mouth daily.     Cholecalciferol (VITAMIN D) 2000 units CAPS Take 1 capsule by mouth daily.     Continuous Blood Gluc Receiver (FREESTYLE LIBRE 2 READER) DEVI 1 each by Does not apply route daily. 1 each 0   Continuous Blood Gluc Sensor (FREESTYLE LIBRE 2 SENSOR) MISC 1 each by Does not apply route every 14 (fourteen) days. 6 each 3   corticotropin (ACTHAR) 80 UNIT/ML injectable gel Acthar gel injection - 80units daily for 2 weeks, followed by 30units daily for 3 days, 15units daily for 3 days, 10units daily for 3 days, then STOP. 17 mL 0   cyclobenzaprine (FLEXERIL) 10 MG tablet Take 10 mg by mouth at bedtime.     diclofenac sodium (VOLTAREN) 1 % GEL Apply 2 g topically 4 (four) times daily. (Patient not taking: Reported on 02/22/2021) 100 g 0   escitalopram (LEXAPRO) 10 MG tablet Take 10 mg by mouth daily.  ezetimibe (ZETIA) 10 MG tablet Take 10 mg by mouth daily.     famotidine (PEPCID) 40 MG tablet Take 40 mg by mouth 2 (two) times daily.     FARXIGA 5 MG TABS tablet Take 1 tablet (5 mg total) by mouth daily. 90 tablet 1   fluticasone (FLONASE) 50 MCG/ACT nasal spray Place 2 sprays into both nostrils daily. 16 g 6   glucose blood (ACCU-CHEK GUIDE) test strip Use as instructed (Patient not taking: Reported on 02/22/2021) 100 each 12   HYDROcodone-acetaminophen (NORCO) 10-325 MG tablet  Take 1 tablet by mouth. 1 tablet 3 times daily (Patient not taking: Reported on 02/22/2021)     insulin aspart (NOVOLOG) 100 UNIT/ML injection Inject 15-20 Units into the skin 3 (three) times daily before meals. 40 mL 3   Insulin Glargine (BASAGLAR KWIKPEN) 100 UNIT/ML Inject under skin 15-20 units in am and 30 units at bedtime 45 mL 3   meloxicam (MOBIC) 15 MG tablet Take 15 mg by mouth daily. (Patient not taking: Reported on 02/22/2021)     Omega-3 Fatty Acids (FISH OIL) 1000 MG CAPS Take 1 capsule by mouth 2 (two) times daily.     omeprazole (PRILOSEC) 40 MG capsule Take 40 mg by mouth daily.     ondansetron (ZOFRAN) 4 MG tablet Take 1 tablet (4 mg total) by mouth every 8 (eight) hours as needed for nausea or vomiting. 20 tablet 0   pantoprazole (PROTONIX) 40 MG tablet Take 40 mg by mouth 2 (two) times daily.     pregabalin (LYRICA) 150 MG capsule Take 75 mg by mouth 2 (two) times daily.     QUEtiapine (SEROQUEL) 100 MG tablet Take half tablet for one week then increase to one tablet by mouth at bedtime 30 tablet 3   Semaglutide, 1 MG/DOSE, (OZEMPIC, 1 MG/DOSE,) 4 MG/3ML SOPN Inject 1 mg into the skin once a week. 9 mL 3   UNIFINE PENTIPS 32G X 4 MM MISC USE TO INJECT INSULIN 4 TIMES DAILY (Patient not taking: Reported on 02/22/2021) 400 each 5   No current facility-administered medications on file prior to visit.    ALLERGIES: Allergies  Allergen Reactions   Other Diarrhea and Rash   Gabapentin Other (See Comments)   Levemir [Insulin Detemir] Dermatitis    Whelps at injection site   Metoclopramide Other (See Comments)    headache   Keflex [Cephalexin] Rash   Prednisone Palpitations    oral    FAMILY HISTORY: Family History  Problem Relation Age of Onset   Cancer Mother    Diabetes Father    Liver disease Father    Migraines Paternal Aunt    Heart attack Maternal Grandmother    Cancer Paternal Grandfather    Migraines Paternal Aunt       Objective:  *** General: No acute  distress.  Patient appears ***-groomed.   Head:  Normocephalic/atraumatic Eyes:  Fundi examined but not visualized Neck: supple, no paraspinal tenderness, full range of motion Heart:  Regular rate and rhythm Lungs:  Clear to auscultation bilaterally Back: No paraspinal tenderness Neurological Exam: alert and oriented to person, place, and time.  Speech fluent and not dysarthric, language intact.  CN II-XII intact. Bulk and tone normal, muscle strength 5/5 throughout.  Sensation to light touch intact.  Deep tendon reflexes 2+ throughout, toes downgoing.  Finger to nose testing intact.  Gait normal, Romberg negative.   Metta Clines, DO  CC: ***

## 2021-05-06 ENCOUNTER — Ambulatory Visit: Payer: Medicaid Other | Admitting: Neurology

## 2021-05-06 ENCOUNTER — Encounter: Payer: Self-pay | Admitting: Neurology

## 2021-05-07 ENCOUNTER — Ambulatory Visit: Payer: 59 | Admitting: Internal Medicine

## 2021-05-10 NOTE — Telephone Encounter (Signed)
Kelsey Vang, Wanted to f/u with the solu-medrol referral.  If the tx is not needed at this time I will cancel the referral.

## 2021-05-11 LAB — HM DIABETES EYE EXAM

## 2021-05-11 NOTE — Telephone Encounter (Signed)
Ok, I will d/c the solu-medrol referral. Thanks Selena Batten

## 2021-05-12 ENCOUNTER — Encounter: Payer: Self-pay | Admitting: Internal Medicine

## 2021-05-14 ENCOUNTER — Other Ambulatory Visit: Payer: Self-pay

## 2021-05-14 ENCOUNTER — Telehealth: Payer: Self-pay | Admitting: Neurology

## 2021-05-14 ENCOUNTER — Ambulatory Visit (INDEPENDENT_AMBULATORY_CARE_PROVIDER_SITE_OTHER): Payer: Managed Care, Other (non HMO) | Admitting: Nurse Practitioner

## 2021-05-14 ENCOUNTER — Encounter: Payer: Self-pay | Admitting: Nurse Practitioner

## 2021-05-14 VITALS — BP 114/80 | HR 96 | Temp 97.8°F | Ht 61.25 in | Wt 266.2 lb

## 2021-05-14 DIAGNOSIS — B3781 Candidal esophagitis: Secondary | ICD-10-CM

## 2021-05-14 DIAGNOSIS — G4733 Obstructive sleep apnea (adult) (pediatric): Secondary | ICD-10-CM

## 2021-05-14 DIAGNOSIS — B370 Candidal stomatitis: Secondary | ICD-10-CM | POA: Diagnosis not present

## 2021-05-14 DIAGNOSIS — H6983 Other specified disorders of Eustachian tube, bilateral: Secondary | ICD-10-CM

## 2021-05-14 DIAGNOSIS — J020 Streptococcal pharyngitis: Secondary | ICD-10-CM

## 2021-05-14 LAB — POCT RAPID STREP A (OFFICE): Rapid Strep A Screen: POSITIVE — AB

## 2021-05-14 MED ORDER — AMOXICILLIN 875 MG PO TABS
875.0000 mg | ORAL_TABLET | Freq: Two times a day (BID) | ORAL | 0 refills | Status: AC
Start: 2021-05-14 — End: 2021-05-24

## 2021-05-14 MED ORDER — FLUCONAZOLE 150 MG PO TABS: 150.0000 mg | ORAL_TABLET | Freq: Once | ORAL | 0 refills | Status: AC

## 2021-05-14 MED ORDER — FLUTICASONE PROPIONATE 50 MCG/ACT NA SUSP: 2.0000 | Freq: Every day | NASAL | 6 refills | Status: DC

## 2021-05-14 MED ORDER — NYSTATIN 100000 UNIT/ML MT SUSP: 5.0000 mL | Freq: Four times a day (QID) | OROMUCOSAL | 0 refills | Status: DC

## 2021-05-14 NOTE — Telephone Encounter (Signed)
Pt would like to see if Dr. Everlena Cooper can order a sleep study for her? She thinks she may have sleep apnea.

## 2021-05-14 NOTE — Patient Instructions (Signed)
Take diflucan tab after completion of oral antibiotics Use mouth wash during oral antibiotic treatment.  Strep Throat, Adult Strep throat is an infection of the throat. It is caused by germs (bacteria). Strep throat is common during the cold months of the year. It mostly affects children who are 46-46 years old. However, people of all ages can get it at any time of the year. This infection spreads from person to person through coughing, sneezing, or having close contact. What are the causes? This condition is caused by the Streptococcus pyogenes germ. What increases the risk? You care for young children. Children are more likely to get strep throat and may spread it to others. You go to crowded places. Germs can spread easily in such places. You kiss or touch someone who has strep throat. What are the signs or symptoms? Fever or chills. Redness, swelling, or pain in the tonsils or throat. Pain or trouble when swallowing. White or yellow spots on the tonsils or throat. Tender glands in the neck and under the jaw. Bad breath. Red rash all over the body. This is rare. How is this treated? Medicines that kill germs (antibiotics). Medicines that treat pain or fever. These include: Ibuprofen or acetaminophen. Aspirin, only for people who are over the age of 31. Cough drops. Throat sprays. Follow these instructions at home: Medicines  Take over-the-counter and prescription medicines only as told by your doctor. Take your antibiotic medicine as told by your doctor. Do not stop taking the antibiotic even if you start to feel better. Eating and drinking  If you have trouble swallowing, eat soft foods until your throat feels better. Drink enough fluid to keep your pee (urine) pale yellow. To help with pain, you may have: Warm fluids, such as soup and tea. Cold fluids, such as frozen desserts or popsicles. General instructions Rinse your mouth (gargle) with a salt-water mixture 3-4 times a  day or as needed. To make a salt-water mixture, dissolve -1 tsp (3-6 g) of salt in 1 cup (237 mL) of warm water. Rest as much as you can. Stay home from work or school until you have been taking antibiotics for 24 hours. Do not smoke or use any products that contain nicotine or tobacco. If you need help quitting, ask your doctor. Keep all follow-up visits. How is this prevented?  Do not share food, drinking cups, or personal items. They can cause the germs to spread. Wash your hands well with soap and water. Make sure that all people in your house wash their hands well. Have family members tested if they have a fever or a sore throat. They may need an antibiotic if they have strep throat. Contact a doctor if: You have swelling in your neck that keeps getting bigger. You get a rash, cough, or earache. You cough up a thick fluid that is green, yellow-brown, or bloody. You have pain that does not get better with medicine. Your symptoms get worse instead of getting better. You have a fever. Get help right away if: You vomit. You have a very bad headache. Your neck hurts or feels stiff. You have chest pain or are short of breath. You have drooling, very bad throat pain, or changes in your voice. Your neck is swollen, or the skin gets red and tender. Your mouth is dry, or you are peeing less than normal. You keep feeling more tired or have trouble waking up. Your joints are red or painful. These symptoms may be an emergency.  Do not wait to see if the symptoms will go away. Get help right away. Call your local emergency services (911 in the [place] Summary Strep throat is an infection of the throat. It is caused by germs (bacteria). This infection can spread from person to person through coughing, sneezing, or having close contact. Take your medicines, including antibiotics, as told by your doctor. Do not stop taking the antibiotic even if you start to feel better. To prevent the spread of  germs, wash your hands well with soap and water. Have others do the same. Do not share food, drinking cups, or personal items. Get help right away if you have a bad headache, chest pain, shortness of breath, a stiff or painful neck, or you vomit. This information is not intended to replace advice given to you by your health care provider. Make sure you discuss any questions you have with your health care provider. Document Revised: 07/28/2020 Document Reviewed: 07/28/2020 Elsevier Patient Education  2022 [employer].

## 2021-05-14 NOTE — Progress Notes (Signed)
Subjective:  Patient ID: Kelsey Vang, female    DOB: [birth year]  Age: 46 y.o. MRN: [id number]  CC: Establish Care (New patient/Would like to discuss left ear pain that travels down her neck to her throat. Pt also states she has been dealing with headaches since having COVID around Christmas. )  Accompanied by husband.  Sore Throat  This is a new problem. The current episode started 1 to 4 weeks ago. The problem has been unchanged. The pain is worse on the left side. There has been no fever. The pain is moderate. Associated symptoms include congestion, ear pain and a plugged ear sensation. Pertinent negatives include no abdominal pain, coughing, diarrhea, drooling, ear discharge, headaches, hoarse voice, neck pain, shortness of breath, stridor, swollen glands, trouble swallowing or vomiting. She has had no exposure to strep or mono. She has tried acetaminophen (amoxicillin 500mg  BID x 10days) for the symptoms. The treatment provided no relief.  Current use of farxiga for DM management.  Reviewed past Medical, Social and Family history today.  Outpatient Medications Prior to Visit  Medication Sig Dispense Refill   busPIRone (BUSPAR) 15 MG tablet Take 15 mg by mouth 2 (two) times daily.     Cholecalciferol (VITAMIN D) 2000 units CAPS Take 1 capsule by mouth daily.     Continuous Blood Gluc Receiver (FREESTYLE LIBRE 2 READER) DEVI 1 each by Does not apply route daily. 1 each 0   Continuous Blood Gluc Sensor (FREESTYLE LIBRE 2 SENSOR) MISC 1 each by Does not apply route every 14 (fourteen) days. 6 each 3   corticotropin (ACTHAR) 80 UNIT/ML injectable gel Acthar gel injection - 80units daily for 2 weeks, followed by 30units daily for 3 days, 15units daily for 3 days, 10units daily for 3 days, then STOP. 17 mL 0   cyclobenzaprine (FLEXERIL) 10 MG tablet Take 10 mg by mouth at bedtime.     diclofenac sodium (VOLTAREN) 1 % GEL Apply 2 g topically 4 (four) times daily. 100 g 0   escitalopram  (LEXAPRO) 10 MG tablet Take 10 mg by mouth daily.     ezetimibe (ZETIA) 10 MG tablet Take 10 mg by mouth daily.     famotidine (PEPCID) 40 MG tablet Take 40 mg by mouth 2 (two) times daily.     FARXIGA 5 MG TABS tablet Take 1 tablet (5 mg total) by mouth daily. 90 tablet 1   glucose blood (ACCU-CHEK GUIDE) test strip Use as instructed 100 each 12   HYDROcodone-acetaminophen (NORCO) 10-325 MG tablet Take 1 tablet by mouth. 1 tablet 3 times daily     insulin aspart (NOVOLOG) 100 UNIT/ML injection Inject 15-20 Units into the skin 3 (three) times daily before meals. 40 mL 3   Insulin Glargine (BASAGLAR KWIKPEN) 100 UNIT/ML Inject under skin 15-20 units in am and 30 units at bedtime 45 mL 3   Omega-3 Fatty Acids (FISH OIL) 1000 MG CAPS Take 1 capsule by mouth 2 (two) times daily.     omeprazole (PRILOSEC) 40 MG capsule Take 40 mg by mouth daily.     ondansetron (ZOFRAN) 4 MG tablet Take 1 tablet (4 mg total) by mouth every 8 (eight) hours as needed for nausea or vomiting. 20 tablet 0   pantoprazole (PROTONIX) 40 MG tablet Take 40 mg by mouth 2 (two) times daily.     pregabalin (LYRICA) 150 MG capsule Take 75 mg by mouth 2 (two) times daily.     QUEtiapine (SEROQUEL) 100 MG tablet Take  half tablet for one week then increase to one tablet by mouth at bedtime 30 tablet 3   Semaglutide, 1 MG/DOSE, (OZEMPIC, 1 MG/DOSE,) 4 MG/3ML SOPN Inject 1 mg into the skin once a week. 9 mL 3   UNIFINE PENTIPS 32G X 4 MM MISC USE TO INJECT INSULIN 4 TIMES DAILY 400 each 5   fluticasone (FLONASE) 50 MCG/ACT nasal spray Place 2 sprays into both nostrils daily. 16 g 6   ACCU-CHEK FASTCLIX LANCETS MISC Use to check sugars 2-3 times daily (Patient not taking: Reported on 02/22/2021) 102 each 11   cetirizine (ZYRTEC) 10 MG tablet Take 10 mg by mouth daily. (Patient not taking: Reported on 05/14/2021)     meloxicam (MOBIC) 15 MG tablet Take 15 mg by mouth daily. (Patient not taking: Reported on 05/14/2021)     No  facility-administered medications prior to visit.    ROS See HPI  Objective:  BP 114/80 (BP Location: Left Arm, Patient Position: Sitting, Cuff Size: Large)    Pulse 96    Temp 97.8 F (36.6 C) (Temporal)    Ht 5' 1.25" (1.556 m)    Wt 266 lb 3.2 oz (120.7 kg)    SpO2 96%    BMI 49.89 kg/m   Physical Exam HENT:     Mouth/Throat:     Mouth: Oral lesions present.     Dentition: Normal dentition.     Pharynx: Uvula midline. Oropharyngeal exudate and posterior oropharyngeal erythema present. No uvula swelling.     Tonsils: Tonsillar exudate present. 1+ on the right. 1+ on the left.   Cardiovascular:     Rate and Rhythm: Normal rate.     Pulses: Normal pulses.  Pulmonary:     Effort: Pulmonary effort is normal.  Musculoskeletal:     Cervical back: Normal range of motion and neck supple.  Lymphadenopathy:     Cervical: Cervical adenopathy present.  Neurological:     Mental Status: She is alert.    Assessment & Plan:  This visit occurred during the SARS-CoV-2 public health emergency.  Safety protocols were in place, including screening questions prior to the visit, additional usage of staff PPE, and extensive cleaning of exam room while observing appropriate contact time as indicated for disinfecting solutions.   Randee was seen today for establish care.  Diagnoses and all orders for this visit:  Acute streptococcal pharyngitis -     POCT rapid strep A -     amoxicillin (AMOXIL) 875 MG tablet; Take 1 tablet (875 mg total) by mouth 2 (two) times daily for 10 days.  Eustachian tube dysfunction, bilateral -     fluticasone (FLONASE) 50 MCG/ACT nasal spray; Place 2 sprays into both nostrils daily.  Thrush of mouth and esophagus (HCC) -     fluconazole (DIFLUCAN) 150 MG tablet; Take 1 tablet (150 mg total) by mouth once for 1 dose. -     nystatin (MYCOSTATIN) 100000 UNIT/ML suspension; Take 5 mLs (500,000 Units total) by mouth 4 (four) times daily.  Take diflucan tab after  completion of oral antibiotics Use mouth wash during oral antibiotic treatment.  Problem List Items Addressed This Visit   None Visit Diagnoses     Acute streptococcal pharyngitis    -  Primary   Relevant Medications   fluconazole (DIFLUCAN) 150 MG tablet   nystatin (MYCOSTATIN) 100000 UNIT/ML suspension   amoxicillin (AMOXIL) 875 MG tablet   Other Relevant Orders   POCT rapid strep A (Completed)   Eustachian tube  dysfunction, bilateral       Relevant Medications   fluticasone (FLONASE) 50 MCG/ACT nasal spray   Thrush of mouth and esophagus (HCC)       Relevant Medications   fluconazole (DIFLUCAN) 150 MG tablet   nystatin (MYCOSTATIN) 100000 UNIT/ML suspension       Follow-up: Return in about 3 months (around 08/12/2021) for CPE (fasting).  Alysia Penna, NP

## 2021-05-14 NOTE — Telephone Encounter (Signed)
LMOVM for pt, referral sent to [facility] for Sleep Apnea.

## 2021-05-17 ENCOUNTER — Other Ambulatory Visit: Payer: Self-pay | Admitting: Pharmacy Technician

## 2021-05-27 NOTE — Progress Notes (Signed)
NEUROLOGY FOLLOW UP OFFICE NOTE  Brianny Mansouri LU:[id number]  Assessment/Plan:   Bilateral non-arteritic optic neuropathy (not optic neuritis)- likely due to controlled diabetes, cannot rule out underlying OSA as cause Migraine with aura, without status migrainosus, not intractable Obstructive sleep apnea   Migraine prevention:  Aimovig 140mg  every 28 days Migraine rescue:  She will try samples of Ubrelvy.  Discontinue sumatriptan.  TRIPTANS CONTRAINDICATED Advised to start ASA 81mg  daily and optimize glycemic control and cholesterol management Limit use of pain relievers to no more than 2 days out of week to prevent risk of rebound or medication-overuse headache. Keep headache diary Follow up with sleep medicine Follow up 4 months.   Subjective:  ZASHA MONTIEL is a 46 year old female with diabetes who follows up for questionable left optic neuritis and migraines.  History supplemented by ophthalmology note.  UPDATE: Due to persistent vision loss, she received round of Solu-Medrol for optic neuritis.  No improvement, so she did receive Acthar but also no improvement.  Underwent workup for optic neuritis: MRI of brain and orbits with and without contrast on 02/22/2021 personally reviewed was limited due to motion but revealed only mild nonspecific white matter T2/FLAIR hyperintensities but otherwise no obvious findings of MS or abnormal enhancement of brain and optic nerves.  MRI of cervical and thoracic spine with and without contrast on 02/24/2021 personally reviewed revealed mild spinal and bilateral foraminal stenosis at C6-7 and mild right foraminal narrowing at C4-5 and C5-6 but no spinal cord abnormalities such as demyelination.  She underwent LP on 03/01/2021 for CSF analysis which demonstrated opening pressure of 15.5 cm water, CSF cell count 3, protein 80 (patient diabetic), glucose 94 (patient diabetic), no oligoclonal bands, IgG 3, negative ACE, negative culture, negative  cytology, nonreactive VDRL.  Serum labs include negative NMO IgG, negative ANA, sed rate 44, B12 438, ACE 36, vit A 64.  Due to insurance reasons, she was unable to get an ophthalmology appointment until 05/11/2021.  She saw Dr. Katy Fitch who actually noted bilateral non-arteritic optic neuropathy (each episode occurred after COVID).  We referred her to sleep medicine and she has upcoming appointment.  Hgb A1c from [place] was 8.9.  Migraines: Intensity:  severe Duration:  1 day Frequency:  4 migraines a month Having a tension type headache in back of neck and head daily.  - treats with lidocaine roll on and Flexeril at bedtime (mostly for back) Frequency of abortive medication: Excedrin 3 days a week. Current NSAIDS: ibuprofen (daily) Current analgesics: Tylenol (daily)  Current triptans: none Current ergotamine: None Current anti-emetic: None Current muscle relaxants: Zanflex 2mg  (just prescribed today for back pain) Current anti-anxiolytic: None Current sleep aide: None Current Antihypertensive medications: Metoprolol, lisinopril Current Antidepressant medications: sertraline Current Anticonvulsant medications: None Current anti-CGRP:none Current Vitamins/Herbal/Supplements: None Current Antihistamines/Decongestants: Flonase, Zyrtec Other therapy: None Hormone/birth control: none   Caffeine:  No coffee.  Will drink caffeine with headache Alcohol: No Smoker: No Diet: Hydrates.  Does not skip meals Exercise: Walks Depression: No; Anxiety: No Other pain: back pain  Sleep hygiene: Poor.  Has OSA and compliant with CPAP  HISTORY: In October 2022, she developed blurred vision followed by darkening vision in the lower half of visual field her left eye.  No associated eye pain but she is on Lyrica for chronic back and leg pain.  She reports intermittent burst of blue and red colors in the left eye, and vision looks red with faded bluish green with eyes closed.  She went to her optometrist  last week whose exam revealed left optic neuritis and disc hemorrhage with full visual fields and no APD.    Onset: Since late teens. Increased frequency for past year. Location:  diffuse Quality:  "lightening bolt" Initial intensity:  Severe..  Sometimes wakes her from sleep.  She denies new headache. Aura:  Occasionally may see flashing lights when her eyes are closed.  Difficult to focus Prodrome:  no Postdrome:  tired Associated symptoms: Nausea, vomiting, photophobia, phonophobia, osmophobia, blurred vision.  No autonomic symptoms.  She denies associated unilateral numbness or weakness. Initial duration:  With treatment, 2 hours severe but less severe for entire day (sometimes 2 days). Initial Frequency:  1 to 2 days a week but has constant dull left frontal/top headache Initial Frequency of abortive medication: ibuprofen 3 days a week.  Sumatriptan 1 to 2 days a week. Triggers: None Relieving factors:  rest Activity:  Cannot function at all once every few months.   Past NSAIDS:  no Past analgesics:  Tylenol, Fioricet Past abortive triptans:  Maxalt, sumatriptan - contraindicated (ischemic optic neuropathy) Past muscle relaxants:  Flexeril Past anti-emetic:  no Past antihypertensive medications:  no Past antidepressant medications:   Amitriptyline 100 mg (ineffective, cause palpitations) Past anticonvulsant medications:  Lyrica, topiramate (hallucinations), gabapentin (hallucinations) Past anti-CGRP:  Ajovy (insurance issue) Past vitamins/Herbal/Supplements:  no Past antihistamines/decongestants:  no Other past therapies:  no   Family history of headache:  mother  PAST MEDICAL HISTORY: Past Medical History:  Diagnosis Date   Anxiety    [employer] 06/09/2020   Chronic back pain    Depression    [employer] 06/09/2020   Diabetes mellitus without complication ([place])    Gastroparesis due to DM ([place])    GERD (gastroesophageal reflux disease)    [employer] 06/09/2020   Hx of  migraines    Hyperlipemia    Hyperlipidemia    [employer] 06/09/2020   Hypertension    IBS (irritable bowel syndrome)    Sinusitis, chronic 01/17/2019   Sleep apnea    [employer] 06/09/2020    MEDICATIONS: Current Outpatient Medications on File Prior to Visit  Medication Sig Dispense Refill   busPIRone (BUSPAR) 15 MG tablet Take 15 mg by mouth 2 (two) times daily.     Cholecalciferol (VITAMIN D) 2000 units CAPS Take 1 capsule by mouth daily.     Continuous Blood Gluc Receiver (FREESTYLE LIBRE 2 READER) DEVI 1 each by Does not apply route daily. 1 each 0   Continuous Blood Gluc Sensor (FREESTYLE LIBRE 2 SENSOR) MISC 1 each by Does not apply route every 14 (fourteen) days. 6 each 3   corticotropin (ACTHAR) 80 UNIT/ML injectable gel Acthar gel injection - 80units daily for 2 weeks, followed by 30units daily for 3 days, 15units daily for 3 days, 10units daily for 3 days, then STOP. 17 mL 0   cyclobenzaprine (FLEXERIL) 10 MG tablet Take 10 mg by mouth at bedtime.     diclofenac sodium (VOLTAREN) 1 % GEL Apply 2 g topically 4 (four) times daily. 100 g 0   escitalopram (LEXAPRO) 10 MG tablet Take 10 mg by mouth daily.     ezetimibe (ZETIA) 10 MG tablet Take 10 mg by mouth daily.     famotidine (PEPCID) 40 MG tablet Take 40 mg by mouth 2 (two) times daily.     FARXIGA 5 MG TABS tablet Take 1 tablet (5 mg total) by mouth daily. 90 tablet 1   fluticasone (FLONASE) 50 MCG/ACT nasal  spray Place 2 sprays into both nostrils daily. 16 g 6   glucose blood (ACCU-CHEK GUIDE) test strip Use as instructed 100 each 12   HYDROcodone-acetaminophen (NORCO) 10-325 MG tablet Take 1 tablet by mouth. 1 tablet 3 times daily     insulin aspart (NOVOLOG) 100 UNIT/ML injection Inject 15-20 Units into the skin 3 (three) times daily before meals. 40 mL 3   Insulin Glargine (BASAGLAR KWIKPEN) 100 UNIT/ML Inject under skin 15-20 units in am and 30 units at bedtime 45 mL 3   nystatin (MYCOSTATIN) 100000 UNIT/ML suspension Take  5 mLs (500,000 Units total) by mouth 4 (four) times daily. 60 mL 0   Omega-3 Fatty Acids (FISH OIL) 1000 MG CAPS Take 1 capsule by mouth 2 (two) times daily.     omeprazole (PRILOSEC) 40 MG capsule Take 40 mg by mouth daily.     ondansetron (ZOFRAN) 4 MG tablet Take 1 tablet (4 mg total) by mouth every 8 (eight) hours as needed for nausea or vomiting. 20 tablet 0   pantoprazole (PROTONIX) 40 MG tablet Take 40 mg by mouth 2 (two) times daily.     pregabalin (LYRICA) 150 MG capsule Take 75 mg by mouth 2 (two) times daily.     QUEtiapine (SEROQUEL) 100 MG tablet Take half tablet for one week then increase to one tablet by mouth at bedtime 30 tablet 3   Semaglutide, 1 MG/DOSE, (OZEMPIC, 1 MG/DOSE,) 4 MG/3ML SOPN Inject 1 mg into the skin once a week. 9 mL 3   UNIFINE PENTIPS 32G X 4 MM MISC USE TO INJECT INSULIN 4 TIMES DAILY 400 each 5   No current facility-administered medications on file prior to visit.    ALLERGIES: Allergies  Allergen Reactions   Other Diarrhea and Rash   Gabapentin Other (See Comments)   Levemir [Insulin Detemir] Dermatitis    Whelps at injection site   Metoclopramide Other (See Comments)    headache   Keflex [Cephalexin] Rash   Prednisone Palpitations    oral    FAMILY HISTORY: Family History  Problem Relation Age of Onset   Cancer Mother    Diabetes Father    Liver disease Father    Migraines Paternal Aunt    Heart attack Maternal Grandmother    Cancer Paternal Grandfather    Migraines Paternal Aunt       Objective:  Blood pressure 109/77, pulse 95, height 5\' 1"  (1.549 m), weight 265 lb (120.2 kg), SpO2 97 %. General: No acute distress.  Patient appears well-groomed.     Metta Clines, DO  CC: Flossie Buffy, NP

## 2021-05-28 ENCOUNTER — Encounter: Payer: Self-pay | Admitting: Neurology

## 2021-05-28 ENCOUNTER — Ambulatory Visit: Payer: Managed Care, Other (non HMO) | Admitting: Neurology

## 2021-05-28 ENCOUNTER — Other Ambulatory Visit: Payer: Self-pay

## 2021-05-28 VITALS — BP 109/77 | HR 95 | Ht 61.0 in | Wt 265.0 lb

## 2021-05-28 DIAGNOSIS — G4733 Obstructive sleep apnea (adult) (pediatric): Secondary | ICD-10-CM | POA: Diagnosis not present

## 2021-05-28 DIAGNOSIS — H47013 Ischemic optic neuropathy, bilateral: Secondary | ICD-10-CM

## 2021-05-28 DIAGNOSIS — G43109 Migraine with aura, not intractable, without status migrainosus: Secondary | ICD-10-CM | POA: Diagnosis not present

## 2021-05-28 MED ORDER — AIMOVIG 140 MG/ML ~~LOC~~ SOAJ: 140.0000 mg | SUBCUTANEOUS | 11 refills | Status: DC

## 2021-05-28 NOTE — Patient Instructions (Signed)
Start Aimovig 140mg  every 28 days Take Ubrelvy 100mg  at earliest onset of migraine.  May repeat in 2 hours (maximum 2 tablets in 24 hours).  Let me know if effective Start aspirin 81mg  daily Follow up with sleep medicine Follow up in 4 weeks.

## 2021-05-31 ENCOUNTER — Telehealth: Payer: Self-pay

## 2021-05-31 ENCOUNTER — Encounter: Payer: Self-pay | Admitting: Neurology

## 2021-05-31 NOTE — Telephone Encounter (Signed)
New message   Your information has been submitted and will be reviewed by [place]. You may close this dialog, return to your dashboard, and perform other tasks. An electronic determination will be received in CoverMyMeds within 72-120 hours. You can see the latest determination by locating this request on your dashboard or by reopening this request. You will receive a fax copy of the determination. If Kelsey Vang has not responded in 120 hours, contact [employer] at [phone].  Kelsey Vang (Key: [id number]) Aimovig 140MG /ML auto-injectors   Form PA Form [phone] NCPDP) Created 8 minutes ago Sent to Plan 5 minutes ago Plan Response 4 minutes ago Submit Clinical Questions less than a minute ago Determination Wait for Determination Please wait for [employer] ESI 2017 to return a determination.

## 2021-06-01 ENCOUNTER — Telehealth: Payer: Self-pay | Admitting: Nurse Practitioner

## 2021-06-01 NOTE — Telephone Encounter (Signed)
Pt requesting to put in for labs for her cholesterol, vitaimin D, kidney function. Pt has been diagnosed with vision loss.  Pt has appointment on 06/04/2021 with [facility] office, wondering can labs be put in at her visit there. Told pt I can send note back, I can't guaranteed that will happen  Pt cell: [phone]

## 2021-06-02 ENCOUNTER — Encounter: Payer: Self-pay | Admitting: Neurology

## 2021-06-02 NOTE — Telephone Encounter (Signed)
LVM for patient to return call and schedule appointment.  

## 2021-06-03 ENCOUNTER — Telehealth: Payer: Self-pay

## 2021-06-03 MED ORDER — UBRELVY 100 MG PO TABS: 100.0000 mg | ORAL_TABLET | ORAL | 5 refills | Status: DC | PRN

## 2021-06-03 NOTE — Telephone Encounter (Signed)
F/u  Kelsey Vang (Key: [id number]) Aimovig 140MG /ML auto-injectors   Form PA Form [id number] NCPDP) Created 3 days ago Sent to Plan 3 days ago Plan Response 3 days ago Submit Clinical Questions 3 days ago Determination Wait for Determination Please wait for [employer] ESI 2017 to return a determination.

## 2021-06-03 NOTE — Telephone Encounter (Signed)
F/u  Kelsey Vang (Key: [id number]) Aimovig 140MG /ML auto-injectors   Form PA Form [phone] NCPDP) Created 3 days ago Sent to Plan 3 days ago Plan Response 3 days ago Submit Clinical Questions 3 days ago Determination Favorable 4 minutes ago Message from Plan CaseId:[id number];Status:Approved;Review Type:Prior Auth;Coverage Start Date:05/31/2021;Coverage End Date:11/30/2021;

## 2021-06-04 ENCOUNTER — Ambulatory Visit: Payer: Medicaid Other | Admitting: Internal Medicine

## 2021-06-04 NOTE — Progress Notes (Unsigned)
Patient ID: Kelsey Vang, female   DOB: 26-Jul-1975, 46 y.o.   MRN: [id number]   This visit occurred during the SARS-CoV-2 public health emergency.  Safety protocols were in place, including screening questions prior to the visit, additional usage of staff PPE, and extensive cleaning of exam room while observing appropriate contact time as indicated for disinfecting solutions.   HPI: Kelsey Vang is a 46 y.o.-year-old female, returning for f/u for DM2, dx in 2010, insulin-dependent since 01/2016, uncontrolled, with long term complications (gastroparesis, peripheral neuropathy). She is the wife of Kelsey Vang, who is also my pt. Last visit 4 mo ago.  Interim hx: No increased urination, blurry vision, nausea, chest pain. She continues to have significant back pain - tingling in both feet at night. On Lyrica 300 mg daily.   Review latest HbA1c levels: Lab Results  Component Value Date   HGBA1C 8.9 (A) 01/29/2021   HGBA1C 6.8 (A) 01/26/2018   HGBA1C 7.9 08/25/2017  06/18/2020: HbA1c 9.6% previously 9.8% 02/26/2016: HbA1c 9.2%. Surprisingly, the fructosamine level checked at the same time was 271 (corresponding to the calculated  HbA1c of 6.2%, which, however, does not correlate with the sugars checked at home) 04/07/2015: HbA1c 7.1%  She is on: - Farxiga 5 mg before breakfast - started 06/18/2020 - Ozempic 0.25 >> 0.5 >> 1 mg weekly >> 0.5 mg 2x a week - Novolog 5-8 >> 30 >> 5-15 >> 15-20 units before meals - Lantus 40 >> 50 >> 15-20 units  in am and 30 units at bedtime She mentioned intolerance to Metformin, Invokana, Januvia, Trulicity >> AP/nausea. Glipizide stopped when [place] started. Tried  Invokana >> yeast infection. Previously on VGo30. Of note, she has a history of gallstone pancreatitis in 1995 and had flares afterwards, despite cholecystectomy.  Pt checks her sugars >4x a day with her Freestyle Libre 2 CGM:   Previously:   Lowest sugar was 30 (night) >> ... 64  (CGM) Highest sugar was 548 >> 338 >> 300s  Glucometer: True Metric >> AccuCheck Guide  Pt's meals are: - Breakfast: instant oatmeal or yoghurt + banana - Lunch: salad + meat  - Dinner: meat + 1-2 veggies, less bread - Snacks: trail mix  -No CKD, last BUN/creatinine:  06/18/2020: 12/0.84, GFR 88, glucose 479 Lab Results  Component Value Date   BUN 13 09/03/2019   BUN 9 10/02/2018   CREATININE 0.70 09/03/2019   CREATININE 0.74 10/02/2018  02/26/2016: Glucose 143, BUN/creatinine 22/0.63, EGFR 107 On lisinopril 5.  -+ HL; last set of lipids: 06/18/2020: 334/522/53/176 Lab Results  Component Value Date   CHOL 304 (H) 09/09/2017   HDL 48 09/09/2017   LDLCALC 198 (H) 09/09/2017   TRIG 289 (H) 09/09/2017   CHOLHDL 6.3 (H) 09/09/2017   02/26/2016: 160/179/52/71 On ezetimibe and fish oil. Had leg cramps from Lipitor.  - last eye exam was in 04/2021 (Dr. 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 Warden Fillers. This means that I have lost vision in the lower part of both eyes."  - no numbness but has tingling in her legs.  She was on Lyrica for carpal tunnel hands and pain in the feet from a previous injury.  She stopped this before her last visit and symptoms have not returned.  On B12.  - She had a  gastric emptying study 05/2019 which showed delayed gastric emptying.  She does have a history of cervical cancer, fibromyalgia, anxiety/depression,  low back pain -treated at [facility].  She is status post steroid injections and prednisone tapers.  On Lyrica and Flexeril. She is on Depo-Provera.  ROS: + See HPI  I reviewed pt's medications, allergies, PMH, social hx, family hx, and changes were documented in the history of present illness. Otherwise, unchanged from my initial visit note.  Past Medical History:  Diagnosis Date   Anxiety    Phreesia 06/09/2020   Chronic back pain    Depression     Phreesia 06/09/2020   Diabetes mellitus without complication ([place])    Gastroparesis due to DM ([place])    GERD (gastroesophageal reflux disease)    Phreesia 06/09/2020   Hx of migraines    Hyperlipemia    Hyperlipidemia    Phreesia 06/09/2020   Hypertension    IBS (irritable bowel syndrome)    Nonarteritic ischemic optic neuropathy, unspecified laterality    Peripheral neuropathy    Sinusitis, chronic 01/17/2019   Sleep apnea    Phreesia 06/09/2020   Past Surgical History:  Procedure Laterality Date   CHOLECYSTECTOMY     ENDOMETRIAL ABLATION     GALLBLADDER SURGERY     TUBAL LIGATION     Social History   Social History   Marital status: Married    Spouse name: Kelsey Vang    Number of children: 1   Occupational History   CMA   Social History Main Topics   Smoking status: Current Every Day Smoker    Packs/day: 1.00    Types: Cigarettes   Smokeless tobacco: Never Used   Alcohol use No   Drug use: No   Current Outpatient Medications on File Prior to Visit  Medication Sig Dispense Refill   aspirin EC 81 MG tablet Take 81 mg by mouth daily. Swallow whole.     busPIRone (BUSPAR) 15 MG tablet Take 15 mg by mouth 2 (two) times daily.     Cholecalciferol (VITAMIN D) 2000 units CAPS Take 1 capsule by mouth daily. (Patient not taking: Reported on 05/31/2021)     Continuous Blood Gluc Receiver (FREESTYLE LIBRE 2 READER) DEVI 1 each by Does not apply route daily. 1 each 0   Continuous Blood Gluc Sensor (FREESTYLE LIBRE 2 SENSOR) MISC 1 each by Does not apply route every 14 (fourteen) days. 6 each 3   cyclobenzaprine (FLEXERIL) 10 MG tablet Take 10 mg by mouth at bedtime.     diclofenac sodium (VOLTAREN) 1 % GEL Apply 2 g topically 4 (four) times daily. (Patient not taking: Reported on 05/31/2021) 100 g 0   diphenoxylate-atropine (LOMOTIL) 2.5-0.025 MG tablet Take by mouth 4 (four) times daily as needed for diarrhea or loose stools.     Erenumab-aooe (AIMOVIG) 140 MG/ML SOAJ  Inject 140 mg into the skin every 28 (twenty-eight) days. 1.12 mL 11   escitalopram (LEXAPRO) 10 MG tablet Take 10 mg by mouth daily.     famotidine (PEPCID) 40 MG tablet Take 40 mg by mouth daily.     FARXIGA 5 MG TABS tablet Take 1 tablet (5 mg total) by mouth daily. 90 tablet 1   fluticasone (FLONASE) 50 MCG/ACT nasal spray Place 2 sprays into both nostrils daily. (Patient not taking: Reported on 05/31/2021) 16 g 6   hydrOXYzine (ATARAX) 25 MG tablet Take 25 mg by mouth daily.     hyoscyamine (LEVBID) 0.375 MG 12 hr tablet Take 0.375 mg by mouth 2 (two) times daily.     insulin aspart (NOVOLOG) 100 UNIT/ML injection Inject  15-20 Units into the skin 3 (three) times daily before meals. 40 mL 3   Insulin Glargine (BASAGLAR KWIKPEN) 100 UNIT/ML Inject under skin 15-20 units in am and 30 units at bedtime 45 mL 3   nystatin (MYCOSTATIN) 100000 UNIT/ML suspension Take 5 mLs (500,000 Units total) by mouth 4 (four) times daily. (Patient not taking: Reported on 05/31/2021) 60 mL 0   Omega-3 Fatty Acids (FISH OIL) 1000 MG CAPS Take 1 capsule by mouth 2 (two) times daily. (Patient not taking: Reported on 05/31/2021)     omeprazole (PRILOSEC) 40 MG capsule Take 40 mg by mouth daily. (Patient not taking: Reported on 05/31/2021)     ondansetron (ZOFRAN) 4 MG tablet Take 1 tablet (4 mg total) by mouth every 8 (eight) hours as needed for nausea or vomiting. 20 tablet 0   pantoprazole (PROTONIX) 40 MG tablet Take 40 mg by mouth 2 (two) times daily.     polycarbophil (FIBERCON) 625 MG tablet Take 625 mg by mouth in the morning and at bedtime. Take two tablets twice a day     pregabalin (LYRICA) 150 MG capsule Take 75 mg by mouth 2 (two) times daily.     QUEtiapine (SEROQUEL) 100 MG tablet Take half tablet for one week then increase to one tablet by mouth at bedtime 30 tablet 3   Semaglutide, 1 MG/DOSE, (OZEMPIC, 1 MG/DOSE,) 4 MG/3ML SOPN Inject 1 mg into the skin once a week. 9 mL 3   Ubrogepant (UBRELVY) 100 MG TABS  Take 100 mg by mouth as needed (take 1 tablet at the earlist onset of Migraine. May repeat in 2 hours. max 2 tabs in 24 hours). 16 tablet 5   UNIFINE PENTIPS 32G X 4 MM MISC USE TO INJECT INSULIN 4 TIMES DAILY 400 each 5   No current facility-administered medications on file prior to visit.   Allergies  Allergen Reactions   Other Diarrhea and Rash   Gabapentin Other (See Comments)   Levemir [Insulin Detemir] Dermatitis    Whelps at injection site   Metoclopramide Other (See Comments)    headache   Keflex [Cephalexin] Rash   Prednisone Palpitations    oral   Family History  Problem Relation Age of Onset   Cancer Mother    Diabetes Father    Liver disease Father    Migraines Paternal Aunt    Heart attack Maternal Grandmother    Cancer Paternal Grandfather    Migraines Paternal Aunt    PE: There were no vitals taken for this visit. Wt Readings from Last 3 Encounters:  05/28/21 265 lb (120.2 kg)  05/14/21 266 lb 3.2 oz (120.7 kg)  02/22/21 263 lb 9.6 oz (119.6 kg)   Constitutional: overweight, in NAD Eyes: PERRLA, EOMI, no exophthalmos ENT: moist mucous membranes, no thyromegaly, no cervical lymphadenopathy Cardiovascular: tachycardia, RR, No MRG Respiratory: CTA B Musculoskeletal: no deformities, strength intact in all 4 Skin: moist, warm, no rashes Neurological: no tremor with outstretched hands, DTR normal in all 4  ASSESSMENT: 1. DM2, insulin-dependent, uncontrolled, without long term complications, but with hyper and hypoglycemia -History of pancreatitis -Gastroparesis -Peripheral neuropathy  2. HL  3.  Obesity class III   PLAN:  1. Patient with longstanding, uncontrolled, type 2 diabetes, on oral antidiabetic regimen with SGLT2 inhibitor, weekly GLP-1 receptor agonist and also basal bolus insulin regimen, which still poor control.  Sugars did improve after starting Ozempic but they were still high at last visit due to steroid injections.  HbA1c was  slightly  better, at 8.9%, decreased from 9.6%.  Reviewing her CGM trends, sugars were higher than target with significantly increases around 2 AM, and also after breakfast and dinner.  She had nausea with the 1 mg Ozempic dose and I advised her to take the 0.5 mg twice a week.  We also increased her NovoLog slightly. -Since last visit we had to change from Lantus to [employer] per insurance preference. CGM interpretation: -At today's visit, we reviewed her CGM downloads: It appears that *** of values are in target range (goal >70%), while *** are higher than 180 (goal <25%), and *** are lower than 70 (goal <4%).  The calculated average blood sugar is ***.  The projected HbA1c for the next 3 months (GMI) is ***. -Reviewing the CGM trends, ***  - I suggested to:  Patient Instructions  Please continue: - Farxiga 5 mg before b'fast - Basaglar 15-20 units in am and 30 units at bedtime - NovoLog 15-20 units before each meal - Ozempic 0.5 mg 2x weekly  Please return in 3 months.  - we checked her HbA1c: 7%  - advised to check sugars at different times of the day - 4x a day, rotating check times - advised for yearly eye exams >> she is UTD - return to clinic in 3 months  2. HL -Reviewed latest lipid panel from 06/2020: Very high triglycerides and LDL -She continues on Zetia 10 mg daily and omega-3 fatty acids, tolerated well.  She could not tolerate Lipitor.  3. Obesity class III -continue SGLT 2 inhibitor and GLP-1 receptor agonist which should also help with weight loss -Previously gained a significant amount of weight due to back pain - being inactive and getting steroid injections -Before last visit she lost more than 20 pounds after starting Ozempic but she gained 9 pounds back in the months prior to the visit due to pain, steroids, antibiotics -  Philemon Kingdom, MD PhD [facility] Endocrinology

## 2021-06-07 ENCOUNTER — Encounter: Payer: Self-pay | Admitting: Internal Medicine

## 2021-06-07 ENCOUNTER — Telehealth: Payer: Self-pay

## 2021-06-07 NOTE — Telephone Encounter (Signed)
F/u  Received fax from [facility]   Medication Ubrelvy 100 mg   Effective date  02.20.2023 to  02.20.2024

## 2021-06-07 NOTE — Telephone Encounter (Signed)
New message   Your information has been submitted and will be reviewed by [place]. You may close this dialog, return to your dashboard, and perform other tasks. An electronic determination will be received in CoverMyMeds within 72-120 hours. You can see the latest determination by locating this request on your dashboard or by reopening this request. You will receive a fax copy of the determination. If Rosann Auerbach has not responded in 120 hours, contact [employer] at [phone].  Coralie Carpen Key: [id number] - PA Case ID: [id number] - Rx #: [id number] Need help? Call us at [phone] Status Sent to [facility] Bernita Raisin 100MG  tablets Form PA Form [phone] NCPDP) Original Claim Info

## 2021-06-07 NOTE — Telephone Encounter (Signed)
F/u   Coralie Carpen Key: [id number] - PA Case ID: [id number] - Rx #: [id number] Need help? Call us at [phone] Status Sent to [facility] Bernita Raisin 100MG  tablets Form PA Form [phone] NCPDP) Original Claim Info

## 2021-06-09 ENCOUNTER — Telehealth: Payer: Self-pay

## 2021-06-09 ENCOUNTER — Encounter: Payer: Self-pay | Admitting: Internal Medicine

## 2021-06-09 ENCOUNTER — Other Ambulatory Visit (HOSPITAL_COMMUNITY): Payer: Self-pay

## 2021-06-09 DIAGNOSIS — E1142 Type 2 diabetes mellitus with diabetic polyneuropathy: Secondary | ICD-10-CM

## 2021-06-09 DIAGNOSIS — Z794 Long term (current) use of insulin: Secondary | ICD-10-CM

## 2021-06-09 NOTE — Telephone Encounter (Signed)
Patient Advocate Encounter   Received notification from patient calls that prior authorization for Ozempic 4mg /17ml pen injector is required by his/her insurance [employer].   PA submitted on 06/09/21  Key#: [place]  Status is pending    [facility] will continue to follow:  Patient Advocate Fax: [phone]

## 2021-06-10 MED ORDER — FREESTYLE PRECISION NEO TEST VI STRP: ORAL_STRIP | 3 refills | Status: DC

## 2021-06-11 ENCOUNTER — Ambulatory Visit (INDEPENDENT_AMBULATORY_CARE_PROVIDER_SITE_OTHER): Payer: Managed Care, Other (non HMO) | Admitting: Nurse Practitioner

## 2021-06-11 ENCOUNTER — Other Ambulatory Visit: Payer: Self-pay

## 2021-06-11 ENCOUNTER — Encounter: Payer: Self-pay | Admitting: Nurse Practitioner

## 2021-06-11 VITALS — BP 122/70 | HR 98 | Temp 98.3°F | Ht 61.25 in | Wt 265.2 lb

## 2021-06-11 DIAGNOSIS — E782 Mixed hyperlipidemia: Secondary | ICD-10-CM | POA: Diagnosis not present

## 2021-06-11 DIAGNOSIS — E1142 Type 2 diabetes mellitus with diabetic polyneuropathy: Secondary | ICD-10-CM | POA: Diagnosis not present

## 2021-06-11 DIAGNOSIS — Z716 Tobacco abuse counseling: Secondary | ICD-10-CM

## 2021-06-11 DIAGNOSIS — Z720 Tobacco use: Secondary | ICD-10-CM

## 2021-06-11 DIAGNOSIS — Z794 Long term (current) use of insulin: Secondary | ICD-10-CM

## 2021-06-11 DIAGNOSIS — H469 Unspecified optic neuritis: Secondary | ICD-10-CM | POA: Diagnosis not present

## 2021-06-11 DIAGNOSIS — E559 Vitamin D deficiency, unspecified: Secondary | ICD-10-CM | POA: Diagnosis not present

## 2021-06-11 MED ORDER — VARENICLINE TARTRATE 0.5 MG PO TABS: ORAL_TABLET | ORAL | 1 refills | Status: DC

## 2021-06-11 NOTE — Assessment & Plan Note (Addendum)
Hx of statin intolerance. No improvement with zetia. Repeat lipid panel Consider lipid clinic referral?

## 2021-06-11 NOTE — Assessment & Plan Note (Signed)
Under the care of Dr. Cruzita Lederer. Current use of novolog, basaglar, farxiga, and ozempic. Positive peripheral neuropathy (tingling in LE at hs) Up to date with eye exam LDL not at goal. Statin intolerance BP at goal Check urine microalbumin today

## 2021-06-11 NOTE — Assessment & Plan Note (Signed)
1.5ppd x 78yrs Previous attempts to quit were unsuccessful: nicotine patch, chantix x 2weeks, attended smoke cessation classes. With chanti, she did not stop tobacco use, hence developed nausea. We discussed use of nicotrol spray vs zyban vs trying chantix again. Has severe migraine, hence hesitant about using zyban. Current insulin use to manage DM, hence nicotrol nasal spray is not recommended. We decided to try chantix again. Advised about titration instructions and need to set a quit date which should be 1week after starting chantix. She verbalized understanding. F/up in 1-32months

## 2021-06-11 NOTE — Progress Notes (Signed)
Subjective:  Patient ID: Kelsey Vang, female    DOB: [birth year]  Age: 46 y.o. MRN: [id number]  CC: Follow-up (Pt requesting labs due to recent vision loss. )  HPI Accompanied by husband.  Mixed hyperlipidemia Hx of statin intolerance. No improvement with zetia. Repeat lipid panel Consider lipid clinic referral?   Tobacco abuse disorder 1.5ppd x 79yrs Previous attempts to quit were unsuccessful: nicotine patch, chantix x 2weeks, attended smoke cessation classes. With chanti, she did not stop tobacco use, hence developed nausea. We discussed use of nicotrol spray vs zyban vs trying chantix again. Has severe migraine, hence hesitant about using zyban. Current insulin use to manage DM, hence nicotrol nasal spray is not recommended. We decided to try chantix again. Advised about titration instructions and need to set a quit date which should be 1week after starting chantix. She verbalized understanding. F/up in 1-67months  Type 2 diabetes mellitus with diabetic polyneuropathy, with long-term current use of insulin (HCC) Under the care of Dr. Elvera Lennox. Current use of novolog, basaglar, farxiga, and ozempic. Positive peripheral neuropathy (tingling in LE at hs) Up to date with eye exam LDL not at goal. Statin intolerance BP at goal Check urine microalbumin today  Reviewed past Medical, Social and Family history today.  Outpatient Medications Prior to Visit  Medication Sig Dispense Refill   aspirin EC 81 MG tablet Take 81 mg by mouth daily. Swallow whole.     busPIRone (BUSPAR) 15 MG tablet Take 15 mg by mouth 2 (two) times daily.     Continuous Blood Gluc Receiver (FREESTYLE LIBRE 2 READER) DEVI 1 each by Does not apply route daily. 1 each 0   Continuous Blood Gluc Sensor (FREESTYLE LIBRE 2 SENSOR) MISC 1 each by Does not apply route every 14 (fourteen) days. 6 each 3   cyclobenzaprine (FLEXERIL) 10 MG tablet Take 10 mg by mouth at bedtime.     diphenoxylate-atropine  (LOMOTIL) 2.5-0.025 MG tablet Take by mouth 4 (four) times daily as needed for diarrhea or loose stools.     Erenumab-aooe (AIMOVIG) 140 MG/ML SOAJ Inject 140 mg into the skin every 28 (twenty-eight) days. 1.12 mL 11   escitalopram (LEXAPRO) 10 MG tablet Take 10 mg by mouth daily.     famotidine (PEPCID) 40 MG tablet Take 40 mg by mouth daily.     FARXIGA 5 MG TABS tablet Take 1 tablet (5 mg total) by mouth daily. 90 tablet 1   glucose blood (FREESTYLE PRECISION NEO TEST) test strip Use as instructed to check blood sugar 4X daily 400 each 3   HYDROcodone-acetaminophen (NORCO) 10-325 MG tablet Take 1 tablet by mouth 3 (three) times daily.     hydrOXYzine (ATARAX) 25 MG tablet Take 25 mg by mouth daily.     hyoscyamine (LEVBID) 0.375 MG 12 hr tablet Take 0.375 mg by mouth 2 (two) times daily.     insulin aspart (NOVOLOG) 100 UNIT/ML injection Inject 15-20 Units into the skin 3 (three) times daily before meals. 40 mL 3   Insulin Glargine (BASAGLAR KWIKPEN) 100 UNIT/ML Inject under skin 15-20 units in am and 30 units at bedtime 45 mL 3   ondansetron (ZOFRAN) 4 MG tablet Take 1 tablet (4 mg total) by mouth every 8 (eight) hours as needed for nausea or vomiting. 20 tablet 0   pantoprazole (PROTONIX) 40 MG tablet Take 40 mg by mouth 2 (two) times daily.     polycarbophil (FIBERCON) 625 MG tablet Take 625 mg by mouth in the  morning and at bedtime. Take two tablets twice a day     pregabalin (LYRICA) 150 MG capsule Take 75 mg by mouth 2 (two) times daily.     QUEtiapine (SEROQUEL) 100 MG tablet Take half tablet for one week then increase to one tablet by mouth at bedtime 30 tablet 3   Semaglutide, 1 MG/DOSE, (OZEMPIC, 1 MG/DOSE,) 4 MG/3ML SOPN Inject 1 mg into the skin once a week. 9 mL 3   Ubrogepant (UBRELVY) 100 MG TABS Take 100 mg by mouth as needed (take 1 tablet at the earlist onset of Migraine. May repeat in 2 hours. max 2 tabs in 24 hours). 16 tablet 5   UNIFINE PENTIPS 32G X 4 MM MISC USE TO INJECT  INSULIN 4 TIMES DAILY 400 each 5   Cholecalciferol (VITAMIN D) 2000 units CAPS Take 1 capsule by mouth daily. (Patient not taking: Reported on 05/31/2021)     diclofenac sodium (VOLTAREN) 1 % GEL Apply 2 g topically 4 (four) times daily. (Patient not taking: Reported on 05/31/2021) 100 g 0   fluticasone (FLONASE) 50 MCG/ACT nasal spray Place 2 sprays into both nostrils daily. (Patient not taking: Reported on 05/31/2021) 16 g 6   nystatin (MYCOSTATIN) 100000 UNIT/ML suspension Take 5 mLs (500,000 Units total) by mouth 4 (four) times daily. (Patient not taking: Reported on 05/31/2021) 60 mL 0   Omega-3 Fatty Acids (FISH OIL) 1000 MG CAPS Take 1 capsule by mouth 2 (two) times daily. (Patient not taking: Reported on 05/31/2021)     omeprazole (PRILOSEC) 40 MG capsule Take 40 mg by mouth daily. (Patient not taking: Reported on 05/31/2021)     No facility-administered medications prior to visit.    ROS See HPI  Objective:  BP 122/70 (BP Location: Left Arm, Patient Position: Sitting, Cuff Size: Large)    Pulse 98    Temp 98.3 F (36.8 C) (Temporal)    Ht 5' 1.25" (1.556 m)    Wt 265 lb 3.2 oz (120.3 kg)    SpO2 98%    BMI 49.70 kg/m   Physical Exam Constitutional:      Appearance: She is obese.  Cardiovascular:     Rate and Rhythm: Normal rate and regular rhythm.     Pulses: Normal pulses.     Heart sounds: Normal heart sounds.  Pulmonary:     Effort: Pulmonary effort is normal.     Breath sounds: Normal breath sounds.  Musculoskeletal:     Right lower leg: No edema.     Left lower leg: No edema.  Skin:    General: Skin is warm and dry.     Findings: No erythema.  Neurological:     Mental Status: She is alert and oriented to person, place, and time.    Assessment & Plan:  This visit occurred during the SARS-CoV-2 public health emergency.  Safety protocols were in place, including screening questions prior to the visit, additional usage of staff PPE, and extensive cleaning of exam room  while observing appropriate contact time as indicated for disinfecting solutions.   Kelsey Vang was seen today for follow-up.  Diagnoses and all orders for this visit:  Type 2 diabetes mellitus with diabetic polyneuropathy, with long-term current use of insulin (HCC) -     Cancel: Comprehensive metabolic panel -     Cancel: Hemoglobin A1c -     Cancel: Microalbumin / creatinine urine ratio -     Comprehensive metabolic panel -     Hemoglobin A1c -  Microalbumin / creatinine urine ratio  Mixed hyperlipidemia -     Cancel: Comprehensive metabolic panel -     Cancel: Lipid panel -     Comprehensive metabolic panel -     Lipid panel  Vitamin D deficiency -     Cancel: Vitamin D (25 hydroxy) -     Vitamin D (25 hydroxy)  Optic neuritis -     Ambulatory referral to Home Health  Current tobacco use -     varenicline (CHANTIX) 0.5 MG tablet; Day1-3: take 0.5mg  daily, Day 4-7: take 0.5mg  BID, Day 8 continuously: 1mg  BID  Encounter for tobacco use cessation counseling -     varenicline (CHANTIX) 0.5 MG tablet; Day1-3: take 0.5mg  daily, Day 4-7: take 0.5mg  BID, Day 8 continuously: 1mg  BID    Problem List Items Addressed This Visit       Endocrine   Type 2 diabetes mellitus with diabetic polyneuropathy, with long-term current use of insulin (HCC) - Primary    Under the care of Dr. . Current use of novolog, basaglar, farxiga, and ozempic. Positive peripheral neuropathy (tingling in LE at hs) Up to date with eye exam LDL not at goal. Statin intolerance BP at goal Check urine microalbumin today      Relevant Medications   varenicline (CHANTIX) 0.5 MG tablet   Other Relevant Orders   Comprehensive metabolic panel   Hemoglobin A1c   Microalbumin / creatinine urine ratio     Nervous and Auditory   Optic neuritis   Relevant Orders   Ambulatory referral to Home Health     Other   Mixed hyperlipidemia    Hx of statin intolerance. No improvement with zetia. Repeat lipid  panel Consider lipid clinic referral?       Relevant Orders   Comprehensive metabolic panel   Lipid panel   Tobacco abuse disorder    1.5ppd x 64yrs Previous attempts to quit were unsuccessful: nicotine patch, chantix x 2weeks, attended smoke cessation classes. With chanti, she did not stop tobacco use, hence developed nausea. We discussed use of nicotrol spray vs zyban vs trying chantix again. Has severe migraine, hence hesitant about using zyban. Current insulin use to manage DM, hence nicotrol nasal spray is not recommended. We decided to try chantix again. Advised about titration instructions and need to set a quit date which should be 1week after starting chantix. She verbalized understanding. F/up in 1-50months      Relevant Medications   varenicline (CHANTIX) 0.5 MG tablet   Other Visit Diagnoses     Vitamin D deficiency       Relevant Orders   Vitamin D (25 hydroxy)   Encounter for tobacco use cessation counseling       Relevant Medications   varenicline (CHANTIX) 0.5 MG tablet       I have spent 31yr with this patient regarding history taking, documentation, review of labs, specialty note-neurology and endocrinology, formulating plan and discussing treatment options with patient.  Follow-up: Return in about 2 months (around 08/09/2021) for tobacco cessation.  , NP

## 2021-06-11 NOTE — Patient Instructions (Signed)
Go to lab for blood draw ? ?Varenicline oral tablets ?What is this medication? ?VARENICLINE (var e NI kleen) is used to help people quit smoking. It is used with a patient support program recommended by your physician. ?This medicine may be used for other purposes; ask your health care provider or pharmacist if you have questions. ?COMMON BRAND NAME(S): Chantix ?What should I tell my care team before I take this medication? ?They need to know if you have any of these conditions: ?heart disease ?if you often drink alcohol ?kidney disease ?mental illness ?on hemodialysis ?seizures ?history of stroke ?suicidal thoughts, plans, or attempt; a previous suicide attempt by you or a family member ?an unusual or allergic reaction to varenicline, other medicines, foods, dyes, or preservatives ?pregnant or trying to get pregnant ?breast-feeding ?How should I use this medication? ?Take this medicine by mouth after eating. Take with a full glass of water. Follow the directions on the prescription label. Take your doses at regular intervals. Do not take your medicine more often than directed. ?There are 3 ways you can use this medicine to help you quit smoking; talk to your health care professional to decide which plan is right for you: ?1) you can choose a quit date and start this medicine 1 week before the quit date, or, ?2) you can start taking this medicine before you choose a quit date, and then pick a quit date between day 8 and 35 days of treatment, or, ?3) if you are not sure that you are able or willing to quit smoking right away, start taking this medicine and slowly decrease the amount you smoke as directed by your health care professional with the goal of being cigarette-free by week 12 of treatment. ?Stick to your plan; ask about support groups or other ways to help you remain cigarette-free. If you are motivated to quit smoking and did not succeed during a previous attempt with this medicine for reasons other than  side effects, or if you returned to smoking after this treatment, speak with your health care professional about whether another course of this medicine may be right for you. ?A special MedGuide will be given to you by the pharmacist with each prescription and refill. Be sure to read this information carefully each time. ?Talk to your pediatrician regarding the use of this medicine in children. This medicine is not approved for use in children. ?Overdosage: If you think you have taken too much of this medicine contact a poison control center or emergency room at once. ?NOTE: This medicine is only for you. Do not share this medicine with others. ?What if I miss a dose? ?If you miss a dose, take it as soon as you can. If it is almost time for your next dose, take only that dose. Do not take double or extra doses. ?What may interact with this medication? ?alcohol ?insulin ?other medicines used to help people quit smoking ?theophylline ?warfarin ?This list may not describe all possible interactions. Give your health care provider a list of all the medicines, herbs, non-prescription drugs, or dietary supplements you use. Also tell them if you smoke, drink alcohol, or use illegal drugs. Some items may interact with your medicine. ?What should I watch for while using this medication? ?It is okay if you do not succeed at your attempt to quit and have a cigarette. You can still continue your quit attempt and keep using this medicine as directed. Just throw away your cigarettes and get back to your   quit plan. ?Talk to your health care provider before using other treatments to quit smoking. Using this medicine with other treatments to quit smoking may increase the risk for side effects compared to using a treatment alone. ?You may get drowsy or dizzy. Do not drive, use machinery, or do anything that needs mental alertness until you know how this medicine affects you. Do not stand or sit up quickly, especially if you are an  older patient. This reduces the risk of dizzy or fainting spells. ?Decrease the number of alcoholic beverages that you drink during treatment with this medicine until you know if this medicine affects your ability to tolerate alcohol. Some people have experienced increased drunkenness (intoxication), unusual or sometimes aggressive behavior, or no memory of things that have happened (amnesia) during treatment with this medicine. ?Sleepwalking can happen during treatment with this medicine, and can sometimes lead to behavior that is harmful to you, other people, or property. Stop taking this medicine and tell your doctor if you start sleepwalking or have other unusual sleep-related activity. ?After taking this medicine, you may get up out of bed and do an activity that you do not know you are doing. The next morning, you may have no memory of this. Activities include driving a car ("sleep-driving"), making and eating food, talking on the phone, sexual activity, and sleep-walking. Serious injuries have occurred. Stop the medicine and call your doctor right away if you find out you have done any of these activities. Do not take this medicine if you have used alcohol that evening. Do not take it if you have taken another medicine for sleep. The risk of doing these sleep-related activities is higher. ?Patients and their families should watch out for new or worsening depression or thoughts of suicide. Also watch out for sudden changes in feelings such as feeling anxious, agitated, panicky, irritable, hostile, aggressive, impulsive, severely restless, overly excited and hyperactive, or not being able to sleep. If this happens, call your health care professional. ?If you have diabetes and you quit smoking, the effects of insulin may be increased and you may need to reduce your insulin dose. Check with your doctor or health care professional about how you should adjust your insulin dose. ?What side effects may I notice from  receiving this medication? ?Side effects that you should report to your doctor or health care professional as soon as possible: ?allergic reactions like skin rash, itching or hives, swelling of the face, lips, tongue, or throat ?acting aggressive, being angry or violent, or acting on dangerous impulses ?breathing problems ?changes in emotions or moods ?chest pain or chest tightness ?feeling faint or lightheaded, falls ?hallucination, loss of contact with reality ?mouth sores ?redness, blistering, peeling or loosening of the skin, including inside the mouth ?signs and symptoms of a stroke like changes in vision; confusion; trouble speaking or understanding; severe headaches; sudden numbness or weakness of the face, arm or leg; trouble walking; dizziness; loss of balance or coordination ?seizures ?sleepwalking ?suicidal thoughts or other mood changes ?Side effects that usually do not require medical attention (report to your doctor or health care professional if they continue or are bothersome): ?constipation ?gas ?headache ?nausea, vomiting ?strange dreams ?trouble sleeping ?This list may not describe all possible side effects. Call your doctor for medical advice about side effects. You may report side effects to FDA at 1-800-FDA-1088. ?Where should I keep my medication? ?Keep out of the reach of children. ?Store at room temperature between 15 and 30 degrees C (59   and 86 degrees F). Throw away any unused medicine after the expiration date. ?NOTE: This sheet is a summary. It may not cover all possible information. If you have questions about this medicine, talk to your doctor, pharmacist, or health care provider. ?? 2022 Elsevier/Gold Standard (2018-03-23 00:00:00) ? ?

## 2021-06-11 NOTE — Assessment & Plan Note (Signed)
Under the care of Dr. Katy Fitch and Dr. Tomi Likens Needs home health-OT to assist with using blind cane navigating her home.

## 2021-06-12 LAB — COMPREHENSIVE METABOLIC PANEL
AG Ratio: 1.7 (calc) (ref 1.0–2.5)
ALT: 33 U/L — ABNORMAL HIGH (ref 6–29)
AST: 24 U/L (ref 10–35)
Albumin: 4.1 g/dL (ref 3.6–5.1)
Alkaline phosphatase (APISO): 119 U/L (ref 31–125)
BUN: 15 mg/dL (ref 7–25)
CO2: 26 mmol/L (ref 20–32)
Calcium: 10 mg/dL (ref 8.6–10.2)
Chloride: 102 mmol/L (ref 98–110)
Creat: 0.69 mg/dL (ref 0.50–0.99)
Globulin: 2.4 g/dL (calc) (ref 1.9–3.7)
Glucose, Bld: 189 mg/dL — ABNORMAL HIGH (ref 65–99)
Potassium: 4.2 mmol/L (ref 3.5–5.3)
Sodium: 137 mmol/L (ref 135–146)
Total Bilirubin: 0.3 mg/dL (ref 0.2–1.2)
Total Protein: 6.5 g/dL (ref 6.1–8.1)

## 2021-06-12 LAB — LIPID PANEL
Cholesterol: 321 mg/dL — ABNORMAL HIGH (ref <200)
HDL: 47 mg/dL — ABNORMAL LOW (ref >=50)
Non-HDL Cholesterol (Calc): 274 mg/dL (calc) — ABNORMAL HIGH (ref <130)
Total CHOL/HDL Ratio: 6.8 (calc) — ABNORMAL HIGH (ref <5.0)
Triglycerides: 421 mg/dL — ABNORMAL HIGH (ref <150)

## 2021-06-12 LAB — HEMOGLOBIN A1C
Hgb A1c MFr Bld: 8.1 % of total Hgb — ABNORMAL HIGH (ref <5.7)
Mean Plasma Glucose: 186 mg/dL
eAG (mmol/L): 10.3 mmol/L

## 2021-06-12 LAB — MICROALBUMIN / CREATININE URINE RATIO
Creatinine, Urine: 63 mg/dL (ref 20–275)
Microalb Creat Ratio: 6 mcg/mg creat (ref <30)
Microalb, Ur: 0.4 mg/dL

## 2021-06-12 LAB — VITAMIN D 25 HYDROXY (VIT D DEFICIENCY, FRACTURES): Vit D, 25-Hydroxy: 28 ng/mL — ABNORMAL LOW (ref 30–100)

## 2021-06-13 ENCOUNTER — Other Ambulatory Visit: Payer: Self-pay | Admitting: Nurse Practitioner

## 2021-06-13 ENCOUNTER — Encounter: Payer: Self-pay | Admitting: Nurse Practitioner

## 2021-06-13 DIAGNOSIS — E782 Mixed hyperlipidemia: Secondary | ICD-10-CM

## 2021-06-13 DIAGNOSIS — E559 Vitamin D deficiency, unspecified: Secondary | ICD-10-CM

## 2021-06-13 MED ORDER — VITAMIN D (ERGOCALCIFEROL) 1.25 MG (50000 UNIT) PO CAPS: 50000.0000 [IU] | ORAL_CAPSULE | ORAL | 0 refills | Status: DC

## 2021-06-14 ENCOUNTER — Encounter: Payer: Self-pay | Admitting: Internal Medicine

## 2021-06-14 DIAGNOSIS — E1142 Type 2 diabetes mellitus with diabetic polyneuropathy: Secondary | ICD-10-CM

## 2021-06-14 DIAGNOSIS — Z794 Long term (current) use of insulin: Secondary | ICD-10-CM

## 2021-06-15 ENCOUNTER — Other Ambulatory Visit (HOSPITAL_COMMUNITY): Payer: Self-pay

## 2021-06-15 MED ORDER — INSULIN LISPRO (1 UNIT DIAL) 100 UNIT/ML (KWIKPEN): 5.0000 [IU] | PEN_INJECTOR | Freq: Three times a day (TID) | SUBCUTANEOUS | 0 refills | Status: DC

## 2021-06-15 NOTE — Telephone Encounter (Signed)
Patient Advocate Encounter  Prior Authorization for Ozempic (1mg  dose) 4mg /2ml pen injectors has been approved.    PA#  Effective dates: 06/15/21 through 06/15/22  Per Test Claim Patients co-pay is $75.   Spoke with Pharmacy to Process.  Patient Advocate Fax: [phone]

## 2021-06-17 ENCOUNTER — Telehealth: Payer: Self-pay

## 2021-06-17 ENCOUNTER — Encounter: Payer: Self-pay | Admitting: Nurse Practitioner

## 2021-06-17 ENCOUNTER — Other Ambulatory Visit (HOSPITAL_COMMUNITY): Payer: Self-pay

## 2021-06-17 ENCOUNTER — Encounter: Payer: Self-pay | Admitting: Internal Medicine

## 2021-06-17 NOTE — Telephone Encounter (Signed)
Patient Advocate Encounter ? ?Prior Authorization for Freestyle Precision Neo Test Strips has been approved.   ? ?PA# [id number] ? ?Effective dates: 06/17/21 through 06/17/22 ? ?Per Test Claim Patients co-pay is $17.43 (30 DS)  ? ?Spoke with Pharmacy to Process. ? ?Patient Advocate ?Fax: [phone]  ?

## 2021-06-21 ENCOUNTER — Telehealth: Payer: Self-pay | Admitting: Nurse Practitioner

## 2021-06-21 NOTE — Telephone Encounter (Signed)
Caller Name: Frankie Spivack ?Call back phone #: [phone] ? ?MEDICATION(S): varenicline (CHANTIX) 0.5 MG tablet [id number]  ? ? ?Days of Med Remaining: none ? ?Has the patient contacted their pharmacy (YES/NO)?  yes ?IF YES, when and what did the pharmacy advise? There is a PA needed for this script.  ?IF NO, request that the patient contact the pharmacy for the refills in the future.  ?           The pharmacy will send an electronic request (except for controlled medications). ? ?Preferred Pharmacy: [facility], [place] - [address]  ?[address] [place] [address]  ?Phone:  [phone]  Fax:  [phone]  ? ?~~~Please advise patient/caregiver to allow 2-3 business days to process RX refills. ? ?

## 2021-06-22 ENCOUNTER — Encounter: Payer: Self-pay | Admitting: Internal Medicine

## 2021-06-22 ENCOUNTER — Encounter: Payer: Self-pay | Admitting: Nurse Practitioner

## 2021-06-22 DIAGNOSIS — E1142 Type 2 diabetes mellitus with diabetic polyneuropathy: Secondary | ICD-10-CM

## 2021-06-22 MED ORDER — OZEMPIC (1 MG/DOSE) 4 MG/3ML ~~LOC~~ SOPN: 1.0000 mg | PEN_INJECTOR | SUBCUTANEOUS | 3 refills | Status: DC

## 2021-06-23 NOTE — Telephone Encounter (Signed)
Pt states she picked up Rx on 06/22/21 ?

## 2021-06-24 ENCOUNTER — Telehealth: Payer: Managed Care, Other (non HMO) | Admitting: Neurology

## 2021-07-02 ENCOUNTER — Encounter: Payer: Self-pay | Admitting: Nurse Practitioner

## 2021-07-05 ENCOUNTER — Telehealth: Payer: Self-pay | Admitting: Nurse Practitioner

## 2021-07-05 NOTE — Telephone Encounter (Signed)
[facility] called to verify directions and quantity of Vit D ?Cb: [id number] Ref # [id number] ?

## 2021-07-06 ENCOUNTER — Other Ambulatory Visit: Payer: Self-pay

## 2021-07-06 ENCOUNTER — Encounter: Payer: Self-pay | Admitting: Internal Medicine

## 2021-07-06 ENCOUNTER — Ambulatory Visit: Payer: Managed Care, Other (non HMO) | Admitting: Internal Medicine

## 2021-07-06 VITALS — BP 128/88 | HR 106 | Ht 61.25 in | Wt 263.2 lb

## 2021-07-06 DIAGNOSIS — Z794 Long term (current) use of insulin: Secondary | ICD-10-CM

## 2021-07-06 DIAGNOSIS — E785 Hyperlipidemia, unspecified: Secondary | ICD-10-CM | POA: Diagnosis not present

## 2021-07-06 DIAGNOSIS — E1142 Type 2 diabetes mellitus with diabetic polyneuropathy: Secondary | ICD-10-CM

## 2021-07-06 MED ORDER — EMPAGLIFLOZIN 10 MG PO TABS: 10.0000 mg | ORAL_TABLET | Freq: Every day | ORAL | 3 refills | Status: DC

## 2021-07-06 NOTE — Progress Notes (Signed)
?Patient ID: Kelsey Vang, female   DOB: 1975/06/09, 46 y.o.   MRN: [id number]  ? ?This visit occurred during the SARS-CoV-2 public health emergency.  Safety protocols were in place, including screening questions prior to the visit, additional usage of staff PPE, and extensive cleaning of exam room while observing appropriate contact time as indicated for disinfecting solutions.  ? ?HPI: ?Kelsey Vang is a 46 y.o.-year-old female, returning for f/u for DM2, dx in 2010, insulin-dependent since 01/2016, uncontrolled, with long term complications (gastroparesis, peripheral neuropathy). She is the wife of Kelsey Vang, who is also my pt. -he accompanies her today.  Last visit 6 mo ago. ? ?Interim hx: ?Patient complains of nausea, abdominal pain, diarrhea and constipation for the last 2 months.  Upon questioning, they exacerbate to do the Ozempic injections.  She just got Ozempic approved by her insurance. ?She also has increased urination as she is trying to stay well-hydrated. ?She had thrush recently, treated twice by PCP. ?She has significant back pain - tingling in both feet at night. On Lyrica 300 mg daily.  ?She tells me Marcelline Deist is not covered anymore, nor is NovoLog. ? ?Review latest HbA1c levels: ?Lab Results  ?Component Value Date  ? HGBA1C 8.1 (H) 06/11/2021  ? HGBA1C 8.9 (A) 01/29/2021  ? HGBA1C 6.8 (A) 01/26/2018  ?06/18/2020: HbA1c 9.6% ?previously 9.8% ?02/26/2016: HbA1c 9.2%. Surprisingly, the fructosamine level checked at the same time was 271 (corresponding to the calculated  HbA1c of 6.2%, which, however, does not correlate with the sugars checked at home) ?04/07/2015: HbA1c 7.1% ? ?Pt was on a regimen of: ?- Metformin ER 1000 mg 2x a day (had N/D/AP with regular metformin). ?- Januvia 100 mg before b'fast (but has a h/o gall stones pancreatitis in 1995 and had flares afterwards, despite cholecystectomy) ?- VGo 30 >> 40: Stopped this as it kept coming off ?- smaller meal 4 >> 6 clicks ?- larger  meal: 6 >> 8 clicks ?- snack: 1-2 clicks  ?Tried  Invokana >> yeast infection. ? ?Then on: ?- Metformin 500 mg 3-4x a day with meals >> 1000 mg 2x a day ?- Januvia 100 mg before b'fast. ?- Humalog 15-25  >> 5-8 units before meals ?- Lantus (20-)40 units at bedtime ? ?She is currently on: ?- Farxiga 5 mg before breakfast - started 06/18/2020 ?- Ozempic 0.25 >> 0.5 >> 1 mg weekly ?- Novolog 5-8 >> 30 >> 5-15 >> 15-20 units before meals ?- Lantus 40 >> 50 >> 15-20 units  in am and 30 >> Basaglar: 20 units in am and 35 units at bedtime ?She now also mentions intolerance to Metformin, Invokana, Januvia, Trulicity >> AP/nausea. ?Glipizide stopped when [place] started. ? ?Pt checks her sugars 1-2 times a day.  She is off CGM due to cost. ?- am: 298 ?- 2h after b'fast: n/a ?- lunch: 376, 377, 449 ?- 2h after lunch: n/c ?- dinner: 253-500 ?- 2h after dinner: 375, 500 ?- bedtime: see above ? ?Previously: ? ? ?Previously: ? ? ?Lowest sugar was 30 (night) >> 50s >> 68 >> 64 (CGM) >> 70. ?Highest sugar was 548 >> 338 >> 300s >> 500 ? ?Glucometer: True Metric >> AccuCheck Guide ? ?Pt's meals are: ?- Breakfast: instant oatmeal or yoghurt + banana ?- Lunch: salad + meat  ?- Dinner: meat + 1-2 veggies, less bread ?- Snacks: trail mix ? ?-No CKD, last BUN/creatinine:  ?Lab Results  ?Component Value Date  ? BUN 15 06/11/2021  ? BUN 13  09/03/2019  ? CREATININE 0.69 06/11/2021  ? CREATININE 0.70 09/03/2019  ?On lisinopril 5. ? ?-+ HL; last set of lipids: ?Lab Results  ?Component Value Date  ? CHOL 321 (H) 06/11/2021  ? HDL 47 (L) 06/11/2021  ? LDLCALC  06/11/2021  ?   Comment:  ?   . ?LDL cholesterol not calculated. Triglyceride levels ?greater than 400 mg/dL invalidate calculated LDL results. ?. ?Reference range: <100 ?Marland Kitchen ?Desirable range <100 mg/dL for primary prevention;   ?<70 mg/dL for patients with CHD or diabetic patients  ?with > or = 2 CHD risk factors. ?. ?LDL-C is now calculated using the Martin-Hopkins  ?calculation, which is  a validated novel method providing  ?better accuracy than the Friedewald equation in the  ?estimation of LDL-C.  ?Horald Pollen et al. Lenox Ahr. 4235;361(44): 2061-2068  ?(http://education.[facility].com/faq/FAQ164) ?  ? TRIG 421 (H) 06/11/2021  ? CHOLHDL 6.8 (H) 06/11/2021  ?06/18/2020: 334/522/53/176 ?02/26/2016: 160/179/52/71 ?Stopped ezetimibe  - continues fish oil. Had leg cramps from Lipitor. ? ?- last eye exam was 04/2021: No DR ? ?- + numbness but has tingling in her legs.  She was on Lyrica for carpal tunnel hands and pain in the feet from a previous injury.  She stopped this before her last visit and symptoms have not returned.  On B12.  Up-to-date with foot exam in 05/2021. ? ?She does have a history of cervical cancer, fibromyalgia, anxiety/depression, low back pain -treated at [facility].  She is status post steroid injections and prednisone tapers.  On Lyrica and Flexeril. ?She had a  gastric emptying study 05/2019 which showed delayed gastric emptying. ?She is on Depo-Provera. ? ?ROS: ?+ See HPI ? ?I reviewed pt's medications, allergies, PMH, social hx, family hx, and changes were documented in the history of present illness. Otherwise, unchanged from my initial visit note. ? ?Past Medical History:  ?Diagnosis Date  ? Anxiety   ? Phreesia 06/09/2020  ? Chronic back pain   ? Depression   ? Phreesia 06/09/2020  ? Diabetes mellitus without complication (HCC)   ? Gastroparesis due to DM [facility])   ? GERD (gastroesophageal reflux disease)   ? Phreesia 06/09/2020  ? Hx of migraines   ? Hyperlipemia   ? Hyperlipidemia   ? Phreesia 06/09/2020  ? Hypertension   ? IBS (irritable bowel syndrome)   ? Nonarteritic ischemic optic neuropathy, unspecified laterality   ? Peripheral neuropathy   ? Sinusitis, chronic 01/17/2019  ? Sleep apnea   ? Phreesia 06/09/2020  ? ?Past Surgical History:  ?Procedure Laterality Date  ? CHOLECYSTECTOMY    ? ENDOMETRIAL ABLATION    ? GALLBLADDER SURGERY    ? TUBAL LIGATION    ? ?Social  History  ? ?Social History  ? Marital status: Married  ?  Spouse name: Kelsey Vang   ? Number of children: 1  ? 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 ?Social History Main Topics  ? Smoking status: Current Every Day Smoker  ?  Packs/day: 1.00  ?  Types: Cigarettes  ? Smokeless tobacco: Never Used  ? Alcohol use No  ? Drug use: No  ? ?Current Outpatient Medications on File Prior to Visit  ?Medication Sig Dispense Refill  ? aspirin EC 81 MG tablet Take 81 mg by mouth daily. Swallow whole.    ? busPIRone (BUSPAR) 15 MG tablet Take 15 mg by mouth 2 (two) times daily.    ? Continuous Blood Gluc Receiver (FREESTYLE LIBRE 2 READER) DEVI 1 each by Does  not apply route daily. 1 each 0  ? Continuous Blood Gluc Sensor (FREESTYLE LIBRE 2 SENSOR) MISC 1 each by Does not apply route every 14 (fourteen) days. 6 each 3  ? cyclobenzaprine (FLEXERIL) 10 MG tablet Take 10 mg by mouth at bedtime.    ? diphenoxylate-atropine (LOMOTIL) 2.5-0.025 MG tablet Take by mouth 4 (four) times daily as needed for diarrhea or loose stools.    ? Erenumab-aooe (AIMOVIG) 140 MG/ML SOAJ Inject 140 mg into the skin every 28 (twenty-eight) days. 1.12 mL 11  ? escitalopram (LEXAPRO) 10 MG tablet Take 10 mg by mouth daily.    ? famotidine (PEPCID) 40 MG tablet Take 40 mg by mouth daily.    ? FARXIGA 5 MG TABS tablet Take 1 tablet (5 mg total) by mouth daily. 90 tablet 1  ? glucose blood (FREESTYLE PRECISION NEO TEST) test strip Use as instructed to check blood sugar 4X daily 400 each 3  ? HYDROcodone-acetaminophen (NORCO) 10-325 MG tablet Take 1 tablet by mouth 3 (three) times daily.    ? hydrOXYzine (ATARAX) 25 MG tablet Take 25 mg by mouth daily.    ? hyoscyamine (LEVBID) 0.375 MG 12 hr tablet Take 0.375 mg by mouth 2 (two) times daily.    ? Insulin Glargine (BASAGLAR KWIKPEN) 100 UNIT/ML Inject under skin 15-20 units in am and 30 units at bedtime 45 mL 3  ? insulin lispro (HUMALOG KWIKPEN) 100 UNIT/ML KwikPen Inject 5-15 Units into the skin 3 (three) times  daily. 15 mL 0  ? ondansetron (ZOFRAN) 4 MG tablet Take 1 tablet (4 mg total) by mouth every 8 (eight) hours as needed for nausea or vomiting. 20 tablet 0  ? pantoprazole (PROTONIX) 40 MG tablet Take

## 2021-07-06 NOTE — Patient Instructions (Addendum)
Please continue: ?- Farxiga 5/Jardiance 10 mg before b'fast ? ?Increase: ?- Basaglar 20 >> 30 units in am and 35 >> 45 units at bedtime ?- NovoLog/Humalog 15-20 >> 25-30 units before each meal ? ?Please stop: ?- Ozempic  ? ?Let me know when you finish the insulins to change to:  ?- Tresiba U200 ?- Lyumjev U200 ? ?Please return in 1.5  months. ?

## 2021-07-08 NOTE — Progress Notes (Signed)
07/09/21- 45 yoF Smoker for sleep evaluation courtesy of Dr Everlena Cooper with concern of OSA. ?Medical problem list includes Migraine, HTN, OSA,Tobacco Use,  Chronic sinusitis, DM2, Gastroparesis, GERD, IBS, Optic Neuropathy, Peripheral Neuropathy, CKD, Morbid Obesity, Hyperlipidemia ?Epworth score-16 ?Body weight today-263 lbs ?Covid vax-3 Phizer ?Flu vax-had ?-----Patient currently wears cpap and did sleep study at [facility] in 2016. 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 She has a lot of trouble finding the right mask.  ?Original study was done at [facility].  Machine is set on 9. ?Has slept better with CPAP when she could get a mask to fit. ?Denies ENT surgery but has been told that she has nasal septal deviation and chronic sinusitis.  Denies heart or lung disease although she smokes every day and has had retinal vascular events.Marland Kitchen ?Grandmother has OSA with CPAP. ? ?Prior to Admission medications   ?Medication Sig Start Date End Date Taking? Authorizing Provider  ?aspirin EC 81 MG tablet Take 81 mg by mouth daily. Swallow whole.   Yes [provider]  ?busPIRone (BUSPAR) 15 MG tablet Take 15 mg by mouth 2 (two) times daily.   Yes [provider]  ?Continuous Blood Gluc Receiver (FREESTYLE LIBRE 2 READER) DEVI 1 each by Does not apply route daily. 06/26/20  Yes Carlus Pavlov, MD  ?Continuous Blood Gluc Sensor (FREESTYLE LIBRE 2 SENSOR) MISC 1 each by Does not apply route every 14 (fourteen) days. 06/26/20  Yes Carlus Pavlov, MD  ?cyclobenzaprine (FLEXERIL) 10 MG tablet Take 10 mg by mouth at bedtime. 06/12/20  Yes [provider]  ?diphenoxylate-atropine (LOMOTIL) 2.5-0.025 MG tablet Take by mouth 4 (four) times daily as needed for diarrhea or loose stools.   Yes [provider]  ?empagliflozin (JARDIANCE) 10 MG TABS tablet Take 1 tablet (10 mg total) by mouth daily before  breakfast. 07/06/21  Yes Carlus Pavlov, MD  ?Dorise Hiss (AIMOVIG) 140 MG/ML SOAJ Inject 140 mg into the skin every 28 (twenty-eight) days. 05/28/21  Yes Jaffe, Adam R, DO  ?escitalopram (LEXAPRO) 10 MG tablet Take 10 mg by mouth daily.   Yes [provider]  ?famotidine (PEPCID) 40 MG tablet Take 40 mg by mouth daily.   Yes [provider]  ?glucose blood (FREESTYLE PRECISION NEO TEST) test strip Use as instructed to check blood sugar 4X daily 06/10/21  Yes Carlus Pavlov, MD  ?HYDROcodone-acetaminophen (NORCO) 10-325 MG tablet Take 1 tablet by mouth 3 (three) times daily. 06/01/21  Yes [provider]  ?hydrOXYzine (ATARAX) 25 MG tablet Take 25 mg by mouth daily. 05/27/21  Yes [provider]  ?hyoscyamine (LEVBID) 0.375 MG 12 hr tablet Take 0.375 mg by mouth 2 (two) times daily.   Yes [provider]  ?Insulin Glargine (BASAGLAR KWIKPEN) 100 UNIT/ML Inject under skin 15-20 units in am and 30 units at bedtime 04/23/21  Yes Carlus Pavlov, MD  ?insulin lispro (HUMALOG KWIKPEN) 100 UNIT/ML KwikPen Inject 5-15 Units into the skin 3 (three) times daily. 06/15/21  Yes Carlus Pavlov, MD  ?ondansetron (ZOFRAN) 4 MG tablet Take 1 tablet (4 mg total) by mouth every 8 (eight) hours as needed for nausea or vomiting. 01/18/19  Yes Royal Hawthorn, NP  ?pantoprazole (PROTONIX) 40 MG tablet Take 40 mg by mouth 2 (two) times daily. 12/17/20  Yes [provider]  ?polycarbophil (FIBERCON) 625 MG tablet Take 625 mg by mouth in  the morning and at bedtime. Take two tablets twice a day   Yes [provider]  ?pregabalin (LYRICA) 150 MG capsule Take 75 mg by mouth 2 (two) times daily. 07/27/20  Yes [provider]  ?QUEtiapine (SEROQUEL) 100 MG tablet Take half tablet for one week then increase to one tablet by mouth at bedtime 11/14/18  Yes Stallings, Zoe A, MD  ?Ubrogepant (UBRELVY) 100 MG TABS Take 100 mg by mouth as needed (take 1 tablet at the earlist  onset of Migraine. May repeat in 2 hours. max 2 tabs in 24 hours). 06/03/21  Yes Jaffe, Adam R, DO  ?UNIFINE PENTIPS 32G X 4 MM MISC USE TO INJECT INSULIN 4 TIMES DAILY 02/02/18  Yes Carlus Pavlov, MD  ?varenicline (CHANTIX) 0.5 MG tablet Day1-3: take 0.5mg  daily, Day 4-7: take 0.5mg  BID, Day 8 continuously:  BID 06/11/21  Yes Nche, Bonna Gains, NP  ?Vitamin D, Ergocalciferol, (DRISDOL) 1.25 MG (50000 UNIT) CAPS capsule Take 1 capsule (50,000 Units total) by mouth every 7 (seven) days. 06/13/21  Yes Nche, Bonna Gains, NP  ? ?Past Medical History:  ?Diagnosis Date  ? Anxiety   ? Phreesia 06/09/2020  ? Chronic back pain   ? Depression   ? Phreesia 06/09/2020  ? Diabetes mellitus without complication (HCC)   ? Gastroparesis due to DM [facility])   ? GERD (gastroesophageal reflux disease)   ? Phreesia 06/09/2020  ? Hx of migraines   ? Hyperlipemia   ? Hyperlipidemia   ? Phreesia 06/09/2020  ? Hypertension   ? IBS (irritable bowel syndrome)   ? Nonarteritic ischemic optic neuropathy, unspecified laterality   ? Peripheral neuropathy   ? Sinusitis, chronic 01/17/2019  ? Sleep apnea   ? Phreesia 06/09/2020  ? ?Past Surgical History:  ?Procedure Laterality Date  ? CHOLECYSTECTOMY    ? ENDOMETRIAL ABLATION    ? GALLBLADDER SURGERY    ? TUBAL LIGATION    ? ?Family History  ?Problem Relation Age of Onset  ? Cancer Mother   ? Diabetes Father   ? Liver disease Father   ? Migraines Paternal Aunt   ? Heart attack Maternal Grandmother   ? Cancer Paternal Grandfather   ? Migraines Paternal Aunt   ? ?Social History  ? ?Socioeconomic History  ? Marital status: Married  ?  Spouse name: Molli Hazard  ? Number of children: 1  ? Years of education: Not on file  ? Highest education level: Not on file  ?Occupational History  ? Not on file  ?Tobacco Use  ? Smoking status: Every Day  ?  Packs/day: 1.50  ?  Years: 20.00  ?  Pack years: 30.00  ?  Types: Cigarettes  ? Smokeless tobacco: Never  ?Vaping Use  ? Vaping Use: Former  ?Substance and Sexual  Activity  ? Alcohol use: No  ? Drug use: No  ? Sexual activity: Yes  ?  Birth control/protection: None  ?Other Topics Concern  ? Not on file  ?Social History Narrative  ? Not on file  ? ?Social Determinants of Health  ? ?Financial Resource Strain: Not on file  ?Food Insecurity: Not on file  ?Transportation Needs: Not on file  ?Physical Activity: Not on file  ?Stress: Not on file  ?Social Connections: Not on file  ?Intimate Partner Violence: Not on file  ? ?ROS-see HPI   + = positive ?Constitutional:    weight loss, night sweats, fevers, chills, fatigue, lassitude. ?HEENT:    headaches, difficulty swallowing, tooth/dental problems, sore  throat,  ?     sneezing, itching, ear ache, nasal congestion, post nasal drip, snoring ?CV:    chest pain, orthopnea, PND, swelling in lower extremities, anasarca,                                  dizziness, palpitations ?Resp:   shortness of breath with exertion or at rest.   ?             productive cough,   non-productive cough, coughing up of blood.   ?           change in color of mucus.  wheezing.   ?Skin:    rash or lesions. ?GI:  No-   heartburn, indigestion, abdominal pain, nausea, vomiting, diarrhea,  ?               change in bowel habits, loss of appetite ?GU: dysuria, change in color of urine, no urgency or frequency.   flank pain. ?MS:   joint pain, stiffness, decreased range of motion, back pain. ?Neuro-     nothing unusual ?Psych:  change in mood or affect.  depression or anxiety.   memory loss. ? ?OBJ- Physical Exam ?General- Alert, Oriented, Affect-appropriate, Distress- none acute, morbidly obese ?Skin- rash-none, lesions- none, excoriation- none ?Lymphadenopathy- none ?Head- atraumatic ?           Eyes- Gross vision intact, PERRLA, conjunctivae and secretions clear ?           Ears- Hearing, canals-normal ?           Nose- Clear, +Septal dev, no-mucus, polyps, erosion, perforation  ?           Throat- Mallampati IV , mucosa clear , drainage- none, tonsils-  atrophic, +teeth ?Neck- flexible , trachea midline, no stridor , thyroid nl, carotid no bruit ?Chest - symmetrical excursion , unlabored ?          Heart/CV- RRR , no murmur , no gallop  , no rub, nl s1 s2 ?

## 2021-07-09 ENCOUNTER — Ambulatory Visit (INDEPENDENT_AMBULATORY_CARE_PROVIDER_SITE_OTHER): Payer: Managed Care, Other (non HMO) | Admitting: Internal Medicine

## 2021-07-09 ENCOUNTER — Encounter: Payer: Self-pay | Admitting: Internal Medicine

## 2021-07-09 ENCOUNTER — Other Ambulatory Visit: Payer: Self-pay

## 2021-07-09 VITALS — BP 102/70 | HR 83 | Temp 97.8°F | Ht 61.0 in | Wt 263.6 lb

## 2021-07-09 DIAGNOSIS — Z720 Tobacco use: Secondary | ICD-10-CM | POA: Diagnosis not present

## 2021-07-09 DIAGNOSIS — G4733 Obstructive sleep apnea (adult) (pediatric): Secondary | ICD-10-CM | POA: Diagnosis not present

## 2021-07-09 NOTE — Patient Instructions (Signed)
Order- DME [facility] please replace old CPAP machine, auto 5-15, mask of choice, humidifier, supplies. Please install AirView/ card ? ?Order- refer to sleep center for mask fitting ? ?Please call if we can help ?

## 2021-07-09 NOTE — Assessment & Plan Note (Signed)
Advised to stop smoking.

## 2021-07-09 NOTE — Assessment & Plan Note (Signed)
Significantly over ideal body weight for her height.  Together with cigarette smoking and underlying vascular disease, this reflects an unhealthy lifestyle she will probably need external support to change. ?

## 2021-07-09 NOTE — Assessment & Plan Note (Signed)
We are requesting original sleep study report.  She is using CPAP but having significant difficulty with mask fit and machine is old. ?Plan-replace old CPAP machine, changing to auto 5-15.  Mask fitting ?

## 2021-07-13 NOTE — Telephone Encounter (Signed)
Unable to reach due to long hold times. Pt is to take 1 tab by mouth once a week.  ?

## 2021-07-16 ENCOUNTER — Ambulatory Visit: Payer: Managed Care, Other (non HMO) | Admitting: Nurse Practitioner

## 2021-07-19 ENCOUNTER — Encounter: Payer: Self-pay | Admitting: Internal Medicine

## 2021-07-19 DIAGNOSIS — E1142 Type 2 diabetes mellitus with diabetic polyneuropathy: Secondary | ICD-10-CM

## 2021-07-20 ENCOUNTER — Other Ambulatory Visit (HOSPITAL_COMMUNITY): Payer: Self-pay

## 2021-07-20 ENCOUNTER — Telehealth: Payer: Self-pay

## 2021-07-20 ENCOUNTER — Encounter (HOSPITAL_BASED_OUTPATIENT_CLINIC_OR_DEPARTMENT_OTHER): Payer: Self-pay | Admitting: Internal Medicine

## 2021-07-20 MED ORDER — LYUMJEV KWIKPEN 200 UNIT/ML ~~LOC~~ SOPN
25.0000 [IU] | PEN_INJECTOR | Freq: Three times a day (TID) | SUBCUTANEOUS | 3 refills | Status: DC
Start: 2021-07-20 — End: 2021-07-21

## 2021-07-20 MED ORDER — TRESIBA FLEXTOUCH 200 UNIT/ML ~~LOC~~ SOPN: 66.0000 [IU] | PEN_INJECTOR | Freq: Every day | SUBCUTANEOUS | 2 refills | Status: DC

## 2021-07-20 NOTE — Telephone Encounter (Addendum)
Patient Advocate Encounter ? ?Prior Authorization for Jardiance 10mg  tabs has been approved.   ? ?PA# ? ?Effective dates: 07/20/21 through 07/21/22 ? ?Per Test Claim Patients co-pay is $30. (90 DS) ? ?Spoke with Pharmacy to Process. ? ?Patient Advocate ?Fax: [phone]  ? ? ?Prior Authorization for [id number] Flextouch 200units/ml has been approved.   ? ?PA# Evaristo Bury ? ?Effective dates: 07/20/21 through 07/21/22 ? ?Per Test Claim Patients co-pay is $35. (30 DS) ? ?Lyumjev Kwikpen 200unit/ml ? ?Key#: [id number] ? ?Denied because not on formulary (plan exclusion). ?

## 2021-07-21 MED ORDER — LYUMJEV KWIKPEN 200 UNIT/ML ~~LOC~~ SOPN: 25.0000 [IU] | PEN_INJECTOR | Freq: Three times a day (TID) | SUBCUTANEOUS | 3 refills | Status: DC

## 2021-07-21 MED ORDER — TRESIBA FLEXTOUCH 200 UNIT/ML ~~LOC~~ SOPN: 66.0000 [IU] | PEN_INJECTOR | Freq: Every day | SUBCUTANEOUS | 2 refills | Status: DC

## 2021-07-22 DIAGNOSIS — G894 Chronic pain syndrome: Secondary | ICD-10-CM | POA: Insufficient documentation

## 2021-07-22 DIAGNOSIS — M461 Sacroiliitis, not elsewhere classified: Secondary | ICD-10-CM | POA: Insufficient documentation

## 2021-07-22 DIAGNOSIS — M47816 Spondylosis without myelopathy or radiculopathy, lumbar region: Secondary | ICD-10-CM | POA: Insufficient documentation

## 2021-07-22 DIAGNOSIS — M48061 Spinal stenosis, lumbar region without neurogenic claudication: Secondary | ICD-10-CM | POA: Insufficient documentation

## 2021-07-30 ENCOUNTER — Ambulatory Visit (INDEPENDENT_AMBULATORY_CARE_PROVIDER_SITE_OTHER): Payer: Managed Care, Other (non HMO) | Admitting: Nurse Practitioner

## 2021-07-30 ENCOUNTER — Ambulatory Visit (INDEPENDENT_AMBULATORY_CARE_PROVIDER_SITE_OTHER): Payer: Managed Care, Other (non HMO)

## 2021-07-30 ENCOUNTER — Ambulatory Visit: Payer: Managed Care, Other (non HMO) | Admitting: Family Medicine

## 2021-07-30 ENCOUNTER — Encounter: Payer: Self-pay | Admitting: Nurse Practitioner

## 2021-07-30 VITALS — BP 115/68 | HR 100 | Temp 97.8°F | Ht 61.0 in | Wt 271.0 lb

## 2021-07-30 DIAGNOSIS — U071 COVID-19: Secondary | ICD-10-CM

## 2021-07-30 DIAGNOSIS — R350 Frequency of micturition: Secondary | ICD-10-CM | POA: Diagnosis not present

## 2021-07-30 DIAGNOSIS — H6690 Otitis media, unspecified, unspecified ear: Secondary | ICD-10-CM | POA: Diagnosis not present

## 2021-07-30 MED ORDER — ALBUTEROL SULFATE HFA 108 (90 BASE) MCG/ACT IN AERS: 2.0000 | INHALATION_SPRAY | Freq: Four times a day (QID) | RESPIRATORY_TRACT | 0 refills | Status: DC | PRN

## 2021-07-30 MED ORDER — DOXYCYCLINE HYCLATE 100 MG PO TABS: 100.0000 mg | ORAL_TABLET | Freq: Two times a day (BID) | ORAL | 0 refills | Status: DC

## 2021-07-30 MED ORDER — FLUCONAZOLE 150 MG PO TABS: 150.0000 mg | ORAL_TABLET | Freq: Once | ORAL | 0 refills | Status: AC

## 2021-07-30 MED ORDER — LEVOFLOXACIN 750 MG PO TABS: 750.0000 mg | ORAL_TABLET | Freq: Every day | ORAL | 0 refills | Status: DC

## 2021-07-30 MED ORDER — PREDNISONE 10 MG PO TABS: ORAL_TABLET | ORAL | 0 refills | Status: DC

## 2021-07-30 NOTE — Addendum Note (Signed)
Addended by: Rodman Pickle A on: 07/30/2021 06:36 PM ? ? Modules accepted: Orders ? ?

## 2021-07-30 NOTE — Progress Notes (Signed)
? ?Acute Office Visit ? ?Subjective:  ? ? Patient ID: Kelsey Vang, female    DOB: 10/10/1975, 46 y.o.   MRN: [id number] ? ?Chief Complaint  ?Patient presents with  ? Nasal Congestion  ?  Congestion, chest tight, SOB, little hypotension yesterday, cough, runny nose little dizzy symptoms x 3 days. Covid test this morning faint positive at home.   ? ? ?HPI ?Patient is in today for nasal congestion and body aches. She tested negative for covid-19 on Tuesday and this morning she did test positive at home. She states her blood pressure was 98/68 and her oxygen was 93-95%. Her symptoms started late last week.  She also endorses urinary frequency, however is not sure if this is from her elevated blood sugars.  She would like to be tested for UTI.  She denies dysuria. ? ?UPPER RESPIRATORY TRACT INFECTION ? ?Fever: no ?Cough: yes ?Shortness of breath: yes with activity ?Wheezing: yes ?Chest pain: no ?Chest tightness: yes ?Chest congestion: yes ?Nasal congestion: yes ?Runny nose: no ?Post nasal drip: no ?Sneezing: yes ?Sore throat: yes ?Swollen glands: no ?Sinus pressure: yes ?Headache: yes ?Face pain: no ?Toothache: no ?Ear pain: yes bilateral ?Ear pressure: yes bilateral ?Eyes red/itching:yes ?Eye drainage/crusting: yes  ?Vomiting: no ?Rash: no ?Fatigue: yes ?Sick contacts: yes - step daughter ?Strep contacts: no  ?Context: worse ?Recurrent sinusitis: no ?Relief with OTC cold/cough medications: yes - some ?Treatments attempted: mucinex, flonase, tylenol, ibuprofen  ? ?Past Medical History:  ?Diagnosis Date  ? Anxiety   ? Phreesia 06/09/2020  ? Chronic back pain   ? Depression   ? Phreesia 06/09/2020  ? Diabetes mellitus without complication (HCC)   ? Gastroparesis due to DM [facility])   ? GERD (gastroesophageal reflux disease)   ? Phreesia 06/09/2020  ? Hx of migraines   ? Hyperlipemia   ? Hyperlipidemia   ? Phreesia 06/09/2020  ? Hypertension   ? IBS (irritable bowel syndrome)   ? Nonarteritic ischemic optic neuropathy,  unspecified laterality   ? Peripheral neuropathy   ? Sinusitis, chronic 01/17/2019  ? Sleep apnea   ? Phreesia 06/09/2020  ? ? ?Past Surgical History:  ?Procedure Laterality Date  ? CHOLECYSTECTOMY    ? ENDOMETRIAL ABLATION    ? GALLBLADDER SURGERY    ? TUBAL LIGATION    ? ? ?Family History  ?Problem Relation Age of Onset  ? Cancer Mother   ? Diabetes Father   ? Liver disease Father   ? Migraines Paternal Aunt   ? Heart attack Maternal Grandmother   ? Cancer Paternal Grandfather   ? Migraines Paternal Aunt   ? ? ?Social History  ? ?Socioeconomic History  ? Marital status: Married  ?  Spouse name: Molli Hazard  ? Number of children: 1  ? Years of education: Not on file  ? Highest education level: Not on file  ?Occupational History  ? Not on file  ?Tobacco Use  ? Smoking status: Every Day  ?  Packs/day: 1.50  ?  Years: 20.00  ?  Pack years: 30.00  ?  Types: Cigarettes  ? Smokeless tobacco: Never  ?Vaping Use  ? Vaping Use: Former  ?Substance and Sexual Activity  ? Alcohol use: No  ? Drug use: No  ? Sexual activity: Yes  ?  Birth control/protection: None  ?Other Topics Concern  ? Not on file  ?Social History Narrative  ? Not on file  ? ?Social Determinants of Health  ? ?Financial Resource Strain: Not on file  ?  Food Insecurity: Not on file  ?Transportation Needs: Not on file  ?Physical Activity: Not on file  ?Stress: Not on file  ?Social Connections: Not on file  ?Intimate Partner Violence: Not on file  ? ? ?Outpatient Medications Prior to Visit  ?Medication Sig Dispense Refill  ? aspirin EC 81 MG tablet Take 81 mg by mouth daily. Swallow whole.    ? busPIRone (BUSPAR) 15 MG tablet Take 15 mg by mouth 2 (two) times daily.    ? Continuous Blood Gluc Receiver (FREESTYLE LIBRE 2 READER) DEVI 1 each by Does not apply route daily. 1 each 0  ? Continuous Blood Gluc Sensor (FREESTYLE LIBRE 2 SENSOR) MISC 1 each by Does not apply route every 14 (fourteen) days. 6 each 3  ? cyclobenzaprine (FLEXERIL) 10 MG tablet Take 10 mg by mouth  at bedtime.    ? diphenoxylate-atropine (LOMOTIL) 2.5-0.025 MG tablet Take by mouth 4 (four) times daily as needed for diarrhea or loose stools.    ? empagliflozin (JARDIANCE) 10 MG TABS tablet Take 1 tablet (10 mg total) by mouth daily before breakfast. 90 tablet 3  ? Erenumab-aooe (AIMOVIG) 140 MG/ML SOAJ Inject 140 mg into the skin every 28 (twenty-eight) days. 1.12 mL 11  ? escitalopram (LEXAPRO) 10 MG tablet Take 10 mg by mouth daily.    ? famotidine (PEPCID) 40 MG tablet Take 40 mg by mouth daily.    ? fluticasone (FLONASE) 50 MCG/ACT nasal spray Place 2 sprays into both nostrils daily.    ? glucose blood (FREESTYLE PRECISION NEO TEST) test strip Use as instructed to check blood sugar 4X daily 400 each 3  ? hydrOXYzine (ATARAX) 25 MG tablet Take 25 mg by mouth daily.    ? hyoscyamine (LEVBID) 0.375 MG 12 hr tablet Take 0.375 mg by mouth 2 (two) times daily.    ? insulin degludec (TRESIBA FLEXTOUCH) 200 UNIT/ML FlexTouch Pen Inject 66 Units into the skin daily. 15 mL 2  ? ondansetron (ZOFRAN) 4 MG tablet Take 1 tablet (4 mg total) by mouth every 8 (eight) hours as needed for nausea or vomiting. 20 tablet 0  ? pantoprazole (PROTONIX) 40 MG tablet Take 40 mg by mouth 2 (two) times daily.    ? polycarbophil (FIBERCON) 625 MG tablet Take 625 mg by mouth in the morning and at bedtime. Take two tablets twice a day    ? pregabalin (LYRICA) 150 MG capsule Take 75 mg by mouth 2 (two) times daily.    ? QUEtiapine (SEROQUEL) 100 MG tablet Take half tablet for one week then increase to one tablet by mouth at bedtime 30 tablet 3  ? Ubrogepant (UBRELVY) 100 MG TABS Take 100 mg by mouth as needed (take 1 tablet at the earlist onset of Migraine. May repeat in 2 hours. max 2 tabs in 24 hours). 16 tablet 5  ? UNIFINE PENTIPS 32G X 4 MM MISC USE TO INJECT INSULIN 4 TIMES DAILY 400 each 5  ? Vitamin D, Ergocalciferol, (DRISDOL) 1.25 MG (50000 UNIT) CAPS capsule Take 1 capsule (50,000 Units total) by mouth every 7 (seven) days. 12  capsule 0  ? HYDROcodone-acetaminophen (NORCO) 10-325 MG tablet Take 1 tablet by mouth 3 (three) times daily. (Patient not taking: Reported on 07/30/2021)    ? Insulin Lispro-aabc (LYUMJEV KWIKPEN) 200 UNIT/ML KwikPen Inject 25-30 Units into the skin 3 (three) times daily before meals. 15 mL 3  ? varenicline (CHANTIX) 0.5 MG tablet Day1-3: take 0.5mg  daily, Day 4-7: take 0.5mg  BID, Day  8 continuously: 1mg  BID (Patient not taking: Reported on 07/30/2021) 120 tablet 1  ? ?No facility-administered medications prior to visit.  ? ? ?Allergies  ?Allergen Reactions  ? Other Diarrhea and Rash  ? Gabapentin Other (See Comments)  ? Levemir [Insulin Detemir] Dermatitis  ?  Whelps at injection site  ? Metoclopramide Other (See Comments)  ?  headache  ? Keflex [Cephalexin] Rash  ? Prednisone Palpitations  ?  oral  ? ? ?Review of Systems ?See pertinent positives and negatives per HPI. ?   ?Objective:  ?  ?Physical Exam ?Vitals and nursing note reviewed.  ?Constitutional:   ?   General: She is not in acute distress. ?   Appearance: Normal appearance.  ?HENT:  ?   Head: Normocephalic.  ?   Right Ear: Ear canal and external ear normal. Tympanic membrane is erythematous.  ?   Left Ear: Ear canal and external ear normal. Tympanic membrane is bulging.  ?Eyes:  ?   Conjunctiva/sclera: Conjunctivae normal.  ?Cardiovascular:  ?   Rate and Rhythm: Normal rate and regular rhythm.  ?   Pulses: Normal pulses.  ?   Heart sounds: Normal heart sounds.  ?Pulmonary:  ?   Effort: Pulmonary effort is normal.  ?   Breath sounds: Normal breath sounds.  ?Abdominal:  ?   Palpations: Abdomen is soft.  ?   Tenderness: There is no abdominal tenderness.  ?Musculoskeletal:  ?   Cervical back: Normal range of motion. No tenderness.  ?Lymphadenopathy:  ?   Cervical: No cervical adenopathy.  ?Skin: ?   General: Skin is warm.  ?Neurological:  ?   General: No focal deficit present.  ?   Mental Status: She is alert and oriented to person, place, and time.   ?Psychiatric:     ?   Mood and Affect: Mood normal.     ?   Behavior: Behavior normal.     ?   Thought Content: Thought content normal.     ?   Judgment: Judgment normal.  ? ? ?BP 115/68 (BP Location: Right Arm, P

## 2021-07-30 NOTE — Assessment & Plan Note (Addendum)
Positive home COVID test this morning.  Her symptoms started a little over a week ago.  She is outside the window for treatment with Paxlovid or infusions.  With shortness of breath, and chest tightness, will treat with prednisone taper and albuterol inhaler as needed.  She states that sometimes prednisone can cause palpitations, however she has tolerated tapers in the past.  She is diabetic and her sugars have been elevated, her reading this morning was 350.  She sees endocrinology, and states that she has a plan for when she is prescribed steroids.  Encouraged her to reach out to her endocrinologist if her sugars are still elevated.  She states that this is similar to when she had COVID-pneumonia, will check chest x-ray today.  Encouraged her to rest and drink plenty of fluids.  Discussed ER precautions.  Discussed isolation.  Follow-up in 1 week or sooner if symptoms do not improve. ?

## 2021-07-30 NOTE — Patient Instructions (Signed)
It was great to see you! ? ?Start doxycylcine 1 tablet twice a day. Start prednisone taper, 6 tablets today, 5 tomorrow, 4 the next day, then keep decreasing by 1 until gone. Take diflucan after finishing the course of antibiotics. I also sent in an albuterol inhaler as needed for shortness of breath.  ? ?Let's follow-up if your symptoms don't improve or worsen ? ?Take care, ? ?Vance Peper, NP ? ?

## 2021-08-02 ENCOUNTER — Telehealth: Payer: Self-pay

## 2021-08-02 ENCOUNTER — Other Ambulatory Visit (HOSPITAL_COMMUNITY): Payer: Self-pay

## 2021-08-02 MED ORDER — BENZONATATE 100 MG PO CAPS: 100.0000 mg | ORAL_CAPSULE | Freq: Two times a day (BID) | ORAL | 0 refills | Status: DC | PRN

## 2021-08-02 MED ORDER — HYDROCOD POLI-CHLORPHE POLI ER 10-8 MG/5ML PO SUER: 5.0000 mL | Freq: Two times a day (BID) | ORAL | 0 refills | Status: DC | PRN

## 2021-08-02 NOTE — Telephone Encounter (Signed)
Contacted pt it schedule follow up appointment for re-evaluation, pt stated she needs something to help combat the cough she has that is persistent and seems to be worse during the night.  ?

## 2021-08-04 ENCOUNTER — Other Ambulatory Visit: Payer: Self-pay | Admitting: Internal Medicine

## 2021-08-04 DIAGNOSIS — Z794 Long term (current) use of insulin: Secondary | ICD-10-CM

## 2021-08-06 ENCOUNTER — Encounter: Payer: Self-pay | Admitting: Internal Medicine

## 2021-08-06 ENCOUNTER — Ambulatory Visit (INDEPENDENT_AMBULATORY_CARE_PROVIDER_SITE_OTHER): Payer: Managed Care, Other (non HMO) | Admitting: Nurse Practitioner

## 2021-08-06 ENCOUNTER — Encounter: Payer: Self-pay | Admitting: Nurse Practitioner

## 2021-08-06 ENCOUNTER — Telehealth: Payer: Self-pay | Admitting: Internal Medicine

## 2021-08-06 VITALS — BP 116/76 | HR 95 | Temp 97.8°F | Wt 267.0 lb

## 2021-08-06 DIAGNOSIS — U071 COVID-19: Secondary | ICD-10-CM | POA: Diagnosis not present

## 2021-08-06 DIAGNOSIS — J029 Acute pharyngitis, unspecified: Secondary | ICD-10-CM

## 2021-08-06 DIAGNOSIS — L0291 Cutaneous abscess, unspecified: Secondary | ICD-10-CM | POA: Diagnosis not present

## 2021-08-06 LAB — POCT RAPID STREP A (OFFICE): Rapid Strep A Screen: NEGATIVE

## 2021-08-06 MED ORDER — MUPIROCIN CALCIUM 2 % EX CREA: 1.0000 "application " | TOPICAL_CREAM | Freq: Two times a day (BID) | CUTANEOUS | 0 refills | Status: DC

## 2021-08-06 NOTE — Assessment & Plan Note (Signed)
Small abscess noted to her right breast.  It is currently not draining, however it is red and there is some skin peeling around it.  Encouraged her to use warm compresses several times a day.  We will also send in some Bactroban ointment that she can use twice a day.  Follow-up if symptoms worsen or do not improve. ?

## 2021-08-06 NOTE — Telephone Encounter (Signed)
FYI patient states that she will be out of her insulin tomorrow. ?

## 2021-08-06 NOTE — Progress Notes (Signed)
? ?Established Patient Office Visit ? ?Subjective   ?Patient ID: Kelsey Vang, female    DOB: 12-13-1975  Age: 46 y.o. MRN: [id number] ? ?Chief Complaint  ?Patient presents with  ? Follow-up  ?  1 wk f/u pt c/o persistent tightness in back an chest, spot on rt breast w/ pain and inflammation x1 wk.   ? ? ?HPI ? ?Kelsey Vang is here today to follow-up after being diagnosed with covid-19 and pneumonia 1 week ago. She was treated with a prednisone taper, doxycyline, and levaquin. She states that she started feeling tightness in her chest after she finished her levaquin. She also noticed a sore throat that started yesterday. Her oxygen has been staying between 93-97%. She is not coughing as much anymore. She took an ibuprofen which helped a little bit which slightly helped a sore throat. ? ?She also noticed a spot on her right breast that started as a knot and now it's turned bigger . It is painful to touch. She states that it has gone down some. She denies drainage and itching. She has not tried any treatments at home.  She denies fevers. ? ? ?ROS ?See pertinent positives and negatives per HPI. ?  ?Objective:  ?  ? ?BP 116/76 (BP Location: Right Arm, Patient Position: Sitting, Cuff Size: Normal)   Pulse 95   Temp 97.8 ?F (36.6 ?C) (Temporal)   Wt 267 lb (121.1 kg)   SpO2 97%   BMI 50.45 kg/m?  ? ? ?Physical Exam ?Vitals and nursing note reviewed.  ?Constitutional:   ?   General: She is not in acute distress. ?   Appearance: Normal appearance.  ?HENT:  ?   Head: Normocephalic.  ?   Mouth/Throat:  ?   Mouth: Mucous membranes are moist.  ?   Pharynx: Posterior oropharyngeal erythema present. No oropharyngeal exudate.  ?Eyes:  ?   Conjunctiva/sclera: Conjunctivae normal.  ?Cardiovascular:  ?   Rate and Rhythm: Normal rate and regular rhythm.  ?   Pulses: Normal pulses.  ?   Heart sounds: Normal heart sounds.  ?Pulmonary:  ?   Effort: Pulmonary effort is normal.  ?   Breath sounds: Normal breath sounds.  ?Musculoskeletal:   ?   Cervical back: Normal range of motion.  ?Skin: ?   General: Skin is warm.  ?   Comments: Abscess to right breast, red, not draining  ?Neurological:  ?   General: No focal deficit present.  ?   Mental Status: She is alert and oriented to person, place, and time.  ?Psychiatric:     ?   Mood and Affect: Mood normal.     ?   Behavior: Behavior normal.     ?   Thought Content: Thought content normal.     ?   Judgment: Judgment normal.  ? ? ? ?Results for orders placed or performed in visit on 08/06/21  ?POCT rapid strep A  ?Result Value Ref Range  ? Rapid Strep A Screen Negative Negative  ? ? ? ? ?The ASCVD Risk score (Arnett DK, et al., 2019) failed to calculate for the following reasons: ?  The valid total cholesterol range is 130 to 320 mg/dL ? ?  ?Assessment & Plan:  ? ?Problem List Items Addressed This Visit   ? ?  ? Other  ? COVID-19  ?  She states that she is feeling better overall since finishing the antibiotic.  She does endorse a sore throat that started yesterday.  She can  gargle with warm salt water, take ibuprofen as needed.  Her oxygen levels have been stable.  Lungs are clear on exam today.  Follow-up with PCP at next scheduled appointment. ? ?  ?  ? Relevant Medications  ? fluconazole (DIFLUCAN) 150 MG tablet  ? mupirocin cream (BACTROBAN) 2 %  ? Abscess  ?  Small abscess noted to her right breast.  It is currently not draining, however it is red and there is some skin peeling around it.  Encouraged her to use warm compresses several times a day.  We will also send in some Bactroban ointment that she can use twice a day.  Follow-up if symptoms worsen or do not improve. ? ?  ?  ? ?Other Visit Diagnoses   ? ? Sore throat    -  Primary  ? Point-of-care strep negative.  She can continue ibuprofen as needed, gargle with warm salt water.  May be related to postnasal drip versus COVID.  ? Relevant Orders  ? POCT rapid strep A (Completed)  ? ?  ? ? ?Return if symptoms worsen or fail to improve.  ? ? ?Charyl Dancer, NP ? ?

## 2021-08-06 NOTE — Patient Instructions (Signed)
It was great to see you! ? ?Start warm compresses on the spots on your right breast and groin 3-4 times day. Start mupirocin cream on the areas twice a day after washing with soap and water.  ? ?You tested negative for strep. You can gargle with warm salt water and take ibuprofen as needed for sore throat.  ? ?Let's follow-up if your symptoms worsen or don't improve.  ? ?Take care, ? ?Rodman Pickle, NP ? ?

## 2021-08-06 NOTE — Telephone Encounter (Signed)
Rx requested refilled. ?

## 2021-08-06 NOTE — Assessment & Plan Note (Signed)
She states that she is feeling better overall since finishing the antibiotic.  She does endorse a sore throat that started yesterday.  She can gargle with warm salt water, take ibuprofen as needed.  Her oxygen levels have been stable.  Lungs are clear on exam today.  Follow-up with PCP at next scheduled appointment. ?

## 2021-08-06 NOTE — Telephone Encounter (Signed)
Patient is calling to say that she will be out of Humalog tomorrow and is having trouble getting new prescription due to insurance issues.   ? insulin lispro (HUMALOG KWIKPEN) 100 UNIT/ML KwikPen  ?  Patient uses ?[facility] - [place], [place] - [address] (Ph: [phone]) ?

## 2021-08-10 ENCOUNTER — Other Ambulatory Visit (HOSPITAL_BASED_OUTPATIENT_CLINIC_OR_DEPARTMENT_OTHER): Payer: Managed Care, Other (non HMO) | Admitting: Internal Medicine

## 2021-08-11 ENCOUNTER — Telehealth: Payer: Self-pay

## 2021-08-11 NOTE — Telephone Encounter (Signed)
Spoke with patient in regards to a PA form that came in for Chantix and patient states she already received medication and she was informed by her pharmacy that Chantix was currently on back order due to a recall and she would be contacted if it became available again.  ?

## 2021-08-12 ENCOUNTER — Other Ambulatory Visit: Payer: Self-pay | Admitting: Internal Medicine

## 2021-08-12 MED ORDER — INSULIN DEGLUDEC 100 UNIT/ML ~~LOC~~ SOLN: SUBCUTANEOUS | 3 refills | Status: DC

## 2021-08-18 ENCOUNTER — Telehealth: Payer: Self-pay | Admitting: Internal Medicine

## 2021-08-18 NOTE — Telephone Encounter (Signed)
Called and spoke with patient about her CPAP order. Advised her that it was placed on 07/09/2021. Advised patient to call [employer] to ask them for an update. Provided her with number. Nothing further needed at this time. ?

## 2021-08-20 ENCOUNTER — Ambulatory Visit: Payer: Managed Care, Other (non HMO) | Admitting: Internal Medicine

## 2021-08-24 ENCOUNTER — Encounter: Payer: Self-pay | Admitting: Internal Medicine

## 2021-08-24 ENCOUNTER — Other Ambulatory Visit (HOSPITAL_COMMUNITY): Payer: Self-pay

## 2021-08-24 ENCOUNTER — Telehealth: Payer: Self-pay | Admitting: Pharmacy Technician

## 2021-08-24 NOTE — Telephone Encounter (Signed)
Patient Advocate Encounter ? ?Received notification from PHARMACY that prior authorization for TRESIBA 100U INSULIN is required. ?  ?PA submitted on 05.09.23 ?Key [id number] ?Status is pending ?  ?[facility] will continue to follow ? ?Nakiesha Rumsey R Dametra Whetsel, CPhT ?Patient Advocate ?[facility] ?Phone: [phone] ?Fax:  [phone] ? ?

## 2021-08-24 NOTE — Telephone Encounter (Signed)
Received notification from [employer] regarding a prior authorization for TRESIBA 100U INSULIN. Authorization has been APPROVED from 5.9.23 to 5.9.24.  ? ?Per test claim, copay for 30 days supply is $34.99 ? ?Authorization # PA Case ID: [id number] ? ? ?

## 2021-08-27 ENCOUNTER — Other Ambulatory Visit: Payer: Commercial Managed Care - HMO

## 2021-08-27 ENCOUNTER — Encounter: Payer: Self-pay | Admitting: Nurse Practitioner

## 2021-08-27 ENCOUNTER — Ambulatory Visit (INDEPENDENT_AMBULATORY_CARE_PROVIDER_SITE_OTHER): Payer: Commercial Managed Care - HMO

## 2021-08-27 ENCOUNTER — Telehealth: Payer: Self-pay

## 2021-08-27 ENCOUNTER — Encounter: Payer: Self-pay | Admitting: Internal Medicine

## 2021-08-27 ENCOUNTER — Telehealth (INDEPENDENT_AMBULATORY_CARE_PROVIDER_SITE_OTHER): Payer: Commercial Managed Care - HMO | Admitting: Nurse Practitioner

## 2021-08-27 VITALS — BP 125/93 | HR 86 | Temp 98.6°F

## 2021-08-27 DIAGNOSIS — U071 COVID-19: Secondary | ICD-10-CM | POA: Diagnosis not present

## 2021-08-27 DIAGNOSIS — E1165 Type 2 diabetes mellitus with hyperglycemia: Secondary | ICD-10-CM

## 2021-08-27 DIAGNOSIS — Z794 Long term (current) use of insulin: Secondary | ICD-10-CM | POA: Diagnosis not present

## 2021-08-27 DIAGNOSIS — J1282 Pneumonia due to coronavirus disease 2019: Secondary | ICD-10-CM

## 2021-08-27 NOTE — Progress Notes (Signed)
Virtual Visit via Video Note ? ?I connected withNAME@ on 08/27/21 at  8:40 AM EDT by a video enabled telemedicine application and verified that I am speaking with the correct person using two identifiers. ? ?Location: ?Patient:Home ?Provider: Office ?Participants: patient and provider ? ?I discussed the limitations of evaluation and management by telemedicine and the availability of in person appointments. ?I also discussed with the patient that there may be a patient responsible charge related to this service. The patient expressed understanding and agreed to proceed. ? ?[id number] reports cough, shortness of breath, back pain. Was dx with pneumonia 4weeks ago, but symptoms worsened on this past Wednesday. ? ?History of Present Illness: ?Previous O2 sat 96-98%, today's O2sat at 92% ? ?Cough ?This is a new problem. The current episode started 1 to 4 weeks ago. The problem has been unchanged. The problem occurs constantly. The cough is Non-productive. Associated symptoms include chest pain, chills, myalgias, shortness of breath and wheezing. Pertinent negatives include no fever, headaches, heartburn, hemoptysis, nasal congestion, postnasal drip, rhinorrhea, sore throat, sweats or weight loss. The symptoms are aggravated by lying down (walking). She has tried a beta-agonist inhaler and prescription cough suppressant (levaquin, doxycyline, prednisone) for the symptoms. The treatment provided no relief. Her past medical history is significant for pneumonia. There is no history of asthma, bronchiectasis, bronchitis, COPD, emphysema or environmental allergies.   ?Tested positive for COVID on 07/27/2021 (home test). ?CXR done 07/30/21: Reticulonodular opacities in the lower lungs and bronchial wall thickening, suggesting bronchopneumonia or other atypical infection. ?With recent completion of oral prednisone dose pack, she has glucose reading 300-500 despite adequate oral hydration and insulin  compliance. ? ?Observations/Objective: ?Alert and oriented x 4, no respiratory distress at rest, normal speech. ?No obvious labored breathing. ? ?Assessment and Plan: ?Kelsey Vang was seen today for cough. ? ?Diagnoses and all orders for this visit: ? ?Pneumonia due to COVID-19 virus ?-     DG Chest 2 View ? ? ?Follow Up Instructions: ?Go to lab for repeat CXR: Subtle opacities at the lung bases are similar to prior imaging, could represent mild pneumonitis particularly on the RIGHT or atelectatic changes. No lobar consolidation or effusion. ?Advised to go to ED. Need to rule out PE and DKA. ?  ?I spent 75mins  with this encounter. We discussed the assessment and treatment plan with the patient. The patient was provided an opportunity to ask questions and all were answered. The patient agreed with the plan and demonstrated an understanding of the instructions. ?  ?The patient was advised to call back or seek an in-person evaluation if the symptoms worsen or if the condition fails to improve as anticipated. ? ? ?Wilfred Lacy, NP  ?

## 2021-08-27 NOTE — Telephone Encounter (Signed)
Called and left a detailed message for pt to increase her insulin while she is experiencing these high readings. Advised to decrease doses once her blood sugar readings regulate. Advised to call with any questions or concerns.  ?

## 2021-08-27 NOTE — Telephone Encounter (Signed)
Pt called to advise her blood sugars have been in the 500's and she has pneumonia. She was treated with steroids in the past and wanted to know if she should increase her insulin doses until after she is finished treatment.  ?

## 2021-08-27 NOTE — Telephone Encounter (Signed)
Yes, absolutely! Please go up on the doses but keep a very close eye on the sugars to see when they start improving to decrease the doses again. ?

## 2021-08-28 ENCOUNTER — Emergency Department (HOSPITAL_COMMUNITY)
Admission: EM | Admit: 2021-08-28 | Discharge: 2021-08-28 | Disposition: A | Discharge status: Home / Self Care | Payer: Commercial Managed Care - HMO | Attending: Emergency Medicine | Admitting: Emergency Medicine

## 2021-08-28 ENCOUNTER — Emergency Department (HOSPITAL_COMMUNITY): Payer: Commercial Managed Care - HMO

## 2021-08-28 ENCOUNTER — Encounter (HOSPITAL_COMMUNITY): Payer: Self-pay

## 2021-08-28 ENCOUNTER — Other Ambulatory Visit: Payer: Self-pay

## 2021-08-28 DIAGNOSIS — R072 Precordial pain: Secondary | ICD-10-CM | POA: Diagnosis not present

## 2021-08-28 DIAGNOSIS — Z8616 Personal history of COVID-19: Secondary | ICD-10-CM | POA: Diagnosis not present

## 2021-08-28 DIAGNOSIS — Z794 Long term (current) use of insulin: Secondary | ICD-10-CM | POA: Diagnosis not present

## 2021-08-28 DIAGNOSIS — Z7982 Long term (current) use of aspirin: Secondary | ICD-10-CM | POA: Diagnosis not present

## 2021-08-28 DIAGNOSIS — I10 Essential (primary) hypertension: Secondary | ICD-10-CM | POA: Insufficient documentation

## 2021-08-28 DIAGNOSIS — R1110 Vomiting, unspecified: Secondary | ICD-10-CM | POA: Insufficient documentation

## 2021-08-28 DIAGNOSIS — R739 Hyperglycemia, unspecified: Secondary | ICD-10-CM

## 2021-08-28 DIAGNOSIS — E119 Type 2 diabetes mellitus without complications: Secondary | ICD-10-CM | POA: Insufficient documentation

## 2021-08-28 DIAGNOSIS — R0789 Other chest pain: Secondary | ICD-10-CM

## 2021-08-28 DIAGNOSIS — R0602 Shortness of breath: Secondary | ICD-10-CM | POA: Insufficient documentation

## 2021-08-28 DIAGNOSIS — R7989 Other specified abnormal findings of blood chemistry: Secondary | ICD-10-CM

## 2021-08-28 LAB — CBC
HCT: 47.6 % — ABNORMAL HIGH (ref 36.0–46.0)
Hemoglobin: 16 g/dL — ABNORMAL HIGH (ref 12.0–15.0)
MCH: 30.9 pg (ref 26.0–34.0)
MCHC: 33.6 g/dL (ref 30.0–36.0)
MCV: 92.1 fL (ref 80.0–100.0)
Platelets: 227 10*3/uL (ref 150–400)
RBC: 5.17 MIL/uL — ABNORMAL HIGH (ref 3.87–5.11)
RDW: 14.5 % (ref 11.5–15.5)
WBC: 13.3 10*3/uL — ABNORMAL HIGH (ref 4.0–10.5)
nRBC: 0.2 % (ref 0.0–0.2)

## 2021-08-28 LAB — COMPREHENSIVE METABOLIC PANEL
ALT: 45 U/L — ABNORMAL HIGH (ref 0–44)
AST: 56 U/L — ABNORMAL HIGH (ref 15–41)
Albumin: 3.2 g/dL — ABNORMAL LOW (ref 3.5–5.0)
Alkaline Phosphatase: 192 U/L — ABNORMAL HIGH (ref 38–126)
Anion gap: 11 (ref 5–15)
BUN: 10 mg/dL (ref 6–20)
CO2: 22 mmol/L (ref 22–32)
Calcium: 8.7 mg/dL — ABNORMAL LOW (ref 8.9–10.3)
Chloride: 102 mmol/L (ref 98–111)
Creatinine, Ser: 0.84 mg/dL (ref 0.44–1.00)
GFR, Estimated: >60 mL/min (ref >60)
Glucose, Bld: 344 mg/dL — ABNORMAL HIGH (ref 70–99)
Potassium: 4.1 mmol/L (ref 3.5–5.1)
Sodium: 135 mmol/L (ref 135–145)
Total Bilirubin: 0.7 mg/dL (ref 0.3–1.2)
Total Protein: 6.2 g/dL — ABNORMAL LOW (ref 6.5–8.1)

## 2021-08-28 LAB — I-STAT BETA HCG BLOOD, ED (MC, WL, AP ONLY): I-stat hCG, quantitative: <5 m[IU]/mL (ref <5)

## 2021-08-28 LAB — TROPONIN I (HIGH SENSITIVITY): Troponin I (High Sensitivity): 7 ng/L (ref <18)

## 2021-08-28 LAB — CBG MONITORING, ED: Glucose-Capillary: 262 mg/dL — ABNORMAL HIGH (ref 70–99)

## 2021-08-28 MED ORDER — SODIUM CHLORIDE 0.9 % IV BOLUS
1000.0000 mL | Freq: Once | INTRAVENOUS | Status: AC
  Administered 2021-08-28: 1000 mL via INTRAVENOUS

## 2021-08-28 MED ORDER — IOHEXOL 350 MG/ML SOLN
100.0000 mL | Freq: Once | INTRAVENOUS | Status: AC | PRN
  Administered 2021-08-28: 100 mL via INTRAVENOUS

## 2021-08-28 NOTE — ED Provider Notes (Signed)
?[facility] ?Provider Note ? ? ?CSN: [id number] ?Arrival date & time: 08/28/21  1028 ? ?  ? ?History ?Chief Complaint  ?Patient presents with  ? Chest Pain  ? Shortness of Breath  ? ? ?Kelsey Vang is a 46 y.o. female with history of diabetes mellitus, GERD, gastroparesis, hyperlipidemia, hypertension who presents to the emergency department today with progressively worsening dyspnea on exertion over the last month.  Patient states that the beginning of the last month patient was diagnosed with COVID and had subsequent pneumonia.  She was treated with antibiotics and cough medicine and had initial improvement for approximately 2 to 3 days. Then she started declining again.  Over the last week, she is having profound shortness of breath with any exertion.  She also reports associated substernal chest pressure that does radiate to the back.  She is also had a couple episodes of vomiting.  She does state that this is not uncommon with her gastroparesis.  She also complains of intermittent subjective fever and chills.  No abdominal pain. ? ? ?Chest Pain ?Associated symptoms: shortness of breath   ?Shortness of Breath ?Associated symptoms: chest pain   ? ?  ? ?Home Medications ?Prior to Admission medications   ?Medication Sig Start Date End Date Taking? Authorizing Provider  ?albuterol (VENTOLIN HFA) 108 (90 Base) MCG/ACT inhaler Inhale 2 puffs into the lungs every 6 (six) hours as needed for wheezing or shortness of breath. 07/30/21   McElwee, Scheryl Darter, NP  ?aspirin EC 81 MG tablet Take 81 mg by mouth daily. Swallow whole.    [provider]  ?benzonatate (TESSALON) 100 MG capsule Take 1 capsule (100 mg total) by mouth 2 (two) times daily as needed for cough. 08/02/21   Charyl Dancer, NP  ?Continuous Blood Gluc Receiver (FREESTYLE LIBRE 2 READER) DEVI 1 each by Does not apply route daily. 06/26/20   Philemon Kingdom, MD  ?Continuous Blood Gluc Sensor (FREESTYLE LIBRE 2  SENSOR) MISC 1 each by Does not apply route every 14 (fourteen) days. 06/26/20   Philemon Kingdom, MD  ?cyclobenzaprine (FLEXERIL) 10 MG tablet Take 10 mg by mouth at bedtime. 06/12/20   [provider]  ?diphenoxylate-atropine (LOMOTIL) 2.5-0.025 MG tablet Take by mouth 4 (four) times daily as needed for diarrhea or loose stools.    [provider]  ?empagliflozin (JARDIANCE) 10 MG TABS tablet Take 1 tablet (10 mg total) by mouth daily before breakfast. 07/06/21   Philemon Kingdom, MD  ?Eduard Roux (AIMOVIG) 140 MG/ML SOAJ Inject 140 mg into the skin every 28 (twenty-eight) days. 05/28/21   Pieter Partridge, DO  ?escitalopram (LEXAPRO) 10 MG tablet Take 10 mg by mouth daily.    [provider]  ?famotidine (PEPCID) 40 MG tablet Take 40 mg by mouth daily.    [provider]  ?fluticasone (FLONASE) 50 MCG/ACT nasal spray Place 2 sprays into both nostrils daily. 07/18/21   [provider]  ?glucose blood (FREESTYLE PRECISION NEO TEST) test strip Use as instructed to check blood sugar 4X daily 06/10/21   Philemon Kingdom, MD  ?HUMALOG KWIKPEN 100 UNIT/ML KwikPen INJECT 5 TO 15 UNITS SUBCUTANEOUSLY THREE TIMES DAILY 08/06/21   Philemon Kingdom, MD  ?hydrOXYzine (ATARAX) 25 MG tablet Take 25 mg by mouth daily. 05/27/21   [provider]  ?Insulin Lispro-aabc (LYUMJEV KWIKPEN) 200 UNIT/ML KwikPen Inject 25-30 Units into the skin 3 (three) times daily before meals. 07/21/21   Philemon Kingdom, MD  ?mupirocin cream (  BACTROBAN) 2 % Apply 1 application. topically 2 (two) times daily. 08/06/21   McElwee, Scheryl Darter, NP  ?ondansetron (ZOFRAN) 4 MG tablet Take 1 tablet (4 mg total) by mouth every 8 (eight) hours as needed for nausea or vomiting. 01/18/19   Wendall Mola, NP  ?pantoprazole (PROTONIX) 40 MG tablet Take 40 mg by mouth 2 (two) times daily. 12/17/20   [provider]  ?polycarbophil (FIBERCON) 625 MG tablet Take 625 mg by mouth in the morning and at bedtime. Take  two tablets twice a day    [provider]  ?pregabalin (LYRICA) 150 MG capsule Take 75 mg by mouth 2 (two) times daily. 07/27/20   [provider]  ?Ubrogepant (UBRELVY) 100 MG TABS Take 100 mg by mouth as needed (take 1 tablet at the [place] onset of Migraine. May repeat in 2 hours. max 2 tabs in 24 hours). 06/03/21   Pieter Partridge, DO  ?UNIFINE PENTIPS 32G X 4 MM MISC USE TO INJECT INSULIN 4 TIMES DAILY 02/02/18   Philemon Kingdom, MD  ?Vitamin D, Ergocalciferol, (DRISDOL) 1.25 MG (50000 UNIT) CAPS capsule Take 1 capsule (50,000 Units total) by mouth every 7 (seven) days. 06/13/21   Nche, Charlene Brooke, NP  ?   ? ?Allergies    ?Other, Gabapentin, Levemir [insulin detemir], Metoclopramide, Keflex [cephalexin], and Prednisone   ? ?Review of Systems   ?Review of Systems  ?Respiratory:  Positive for shortness of breath.   ?Cardiovascular:  Positive for chest pain.  ?All other systems reviewed and are negative. ? ?Physical Exam ?Updated Vital Signs ?BP 123/74 (BP Location: Right Arm)   Pulse 85   Temp 99.2 ?F (37.3 ?C) (Oral)   Resp 20   Ht 5\' 2"  (1.575 m)   Wt 120.2 kg   SpO2 98%   BMI 48.47 kg/m?  ?Physical Exam ?Vitals and nursing note reviewed.  ?Constitutional:   ?   General: She is not in acute distress. ?   Appearance: Normal appearance.  ?HENT:  ?   Head: Normocephalic and atraumatic.  ?Eyes:  ?   General:     ?   Right eye: No discharge.     ?   Left eye: No discharge.  ?Cardiovascular:  ?   Comments: Regular rate and rhythm.  S1/S2 are distinct without any evidence of murmur, rubs, or gallops.  Radial pulses are 2+ bilaterally.  Dorsalis pedis pulses are 2+ bilaterally.  No evidence of pedal edema. ?Pulmonary:  ?   Comments: Clear to auscultation bilaterally.  Normal effort.  No respiratory distress.  No evidence of wheezes, rales, or rhonchi heard throughout. ?Abdominal:  ?   General: Abdomen is flat. Bowel sounds are normal. There is no distension.  ?   Tenderness: There is no  abdominal tenderness. There is no guarding or rebound.  ?Musculoskeletal:     ?   General: Normal range of motion.  ?   Cervical back: Neck supple.  ?Skin: ?   General: Skin is warm and dry.  ?   Findings: No rash.  ?Neurological:  ?   General: No focal deficit present.  ?   Mental Status: She is alert.  ?Psychiatric:     ?   Mood and Affect: Mood normal.     ?   Behavior: Behavior normal.  ? ? ?ED Results / Procedures / Treatments   ?Labs ?(all labs ordered are listed, but only abnormal results are displayed) ?Labs Reviewed  ?CBC  ?COMPREHENSIVE METABOLIC PANEL  ?  I-STAT BETA HCG BLOOD, ED ([facility], [facility], [facility] ONLY)  ?TROPONIN I (HIGH SENSITIVITY)  ?TROPONIN I (HIGH SENSITIVITY)  ? ? ?EKG ?None ? ?Radiology ?DG Chest 2 View ? ?Result Date: 08/27/2021 ?CLINICAL DATA:  Shortness of breath on exertion. EXAM: CHEST - 2 VIEW COMPARISON:  July 30, 2021. FINDINGS: Subtle opacities at the lung bases are similar to prior imaging, no lobar consolidation. No pleural effusion. Cardiomediastinal contours and hilar structures are stable. On limited assessment there is no acute skeletal finding. IMPRESSION: Subtle opacities at the lung bases are similar to prior imaging, could represent mild pneumonitis particularly on the RIGHT or atelectatic changes. No lobar consolidation or effusion. Electronically Signed   By: Zetta Bills M.D.   On: 08/27/2021 09:48   ? ?Procedures ?Procedures  ? ? ?Medications Ordered in ED ?Medications - No data to display ? ?ED Course/ Medical Decision Making/ A&P ?Clinical Course as of 08/28/21 1520  ?Sat Aug 28, 2021  ?1512 FU on CTA, if neg dc home [BH]  ?  ?Clinical Course User Index ?[BH] Henderly, Britni A, PA-C  ? ?                        ?Medical Decision Making ? ?This patient presents to the ED for concern of shortness of breath, this involves an extensive number of treatment options, and is a complaint that carries with it a high risk of complications and morbidity.  The differential diagnosis  includes pulmonary embolism, ACS, pneumonia, pneumothorax although less likely given the time course. ? ? ?Co morbidities that complicate the patient evaluation ? ?Past Medical History:  ?Diagnosis Date  ? Anxiety   ?

## 2021-08-28 NOTE — ED Triage Notes (Signed)
Pt arrived POV from home c/o [facility] and centralized CP that started on Monday. Pt states she had COVID and pneumonia back in April and her PCP did an x-ray and saw something funny, per pt she was told to be checked for a PE.  ?

## 2021-08-28 NOTE — Discharge Instructions (Signed)
I have placed a referral to the specialist.  They should contact you for an appointment or you may contact them on Monday. ? ?Continue using your inhalers do not use any steroids as your blood sugars have been elevated ? ?Return for new or worsening symptoms ?

## 2021-08-28 NOTE — ED Provider Notes (Signed)
Care assumed from previous provider at shift change.  See note for full HPI. ? ?In summation 46 year old here for evaluation of shortness of breath and chest tightness for the last month.  Was diagnosed with pneumonia 1 month ago and subsequently had pneumonia.  Treated with antibiotics, had improvement however symptoms improved.  No associated abdominal pain. ? ?Plan on follow-up with CTA.  If no significant findings can DC home.  Did have elevated LFTs her previous provider did not suspect intra-abdominal process.  She has no abdominal pain. ?Physical Exam  ?BP 120/73 (BP Location: Right Arm)   Pulse 93   Temp 99.2 ?F (37.3 ?C) (Oral)   Resp 18   Ht 5\' 2"  (1.575 m)   Wt 120.2 kg   SpO2 95%   BMI 48.47 kg/m?  ? ?Physical Exam ?Vitals and nursing note reviewed.  ?Constitutional:   ?   General: She is not in acute distress. ?   Appearance: She is well-developed. She is not ill-appearing, toxic-appearing or diaphoretic.  ?HENT:  ?   Head: Atraumatic.  ?Eyes:  ?   Pupils: Pupils are equal, round, and reactive to light.  ?Cardiovascular:  ?   Rate and Rhythm: Normal rate.  ?Pulmonary:  ?   Effort: Pulmonary effort is normal. No respiratory distress.  ?Abdominal:  ?   General: There is no distension.  ?Musculoskeletal:     ?   General: Normal range of motion.  ?   Cervical back: Normal range of motion.  ?Skin: ?   General: Skin is warm and dry.  ?Neurological:  ?   General: No focal deficit present.  ?   Mental Status: She is alert.  ?Psychiatric:     ?   Mood and Affect: Mood normal.  ? ? ?Procedures  ?Procedures ?Labs Reviewed  ?CBC - Abnormal; Notable for the following components:  ?    Result Value  ? WBC 13.3 (*)   ? RBC 5.17 (*)   ? Hemoglobin 16.0 (*)   ? HCT 47.6 (*)   ? All other components within normal limits  ?COMPREHENSIVE METABOLIC PANEL - Abnormal; Notable for the following components:  ? Glucose, Bld 344 (*)   ? Calcium 8.7 (*)   ? Total Protein 6.2 (*)   ? Albumin 3.2 (*)   ? AST 56 (*)   ? ALT  45 (*)   ? Alkaline Phosphatase 192 (*)   ? All other components within normal limits  ?I-STAT BETA HCG BLOOD, ED ([facility], [facility], [facility] ONLY)  ?CBG MONITORING, ED  ?TROPONIN I (HIGH SENSITIVITY)  ?TROPONIN I (HIGH SENSITIVITY)  ? DG Chest 2 View ? ?Result Date: 08/27/2021 ?CLINICAL DATA:  Shortness of breath on exertion. EXAM: CHEST - 2 VIEW COMPARISON:  July 30, 2021. FINDINGS: Subtle opacities at the lung bases are similar to prior imaging, no lobar consolidation. No pleural effusion. Cardiomediastinal contours and hilar structures are stable. On limited assessment there is no acute skeletal finding. IMPRESSION: Subtle opacities at the lung bases are similar to prior imaging, could represent mild pneumonitis particularly on the RIGHT or atelectatic changes. No lobar consolidation or effusion. Electronically Signed   By: August 01, 2021 M.D.   On: 08/27/2021 09:48  ? ?DG Chest 2 View ? ?Result Date: 07/30/2021 ?CLINICAL DATA:  46 year old female with shortness of breath EXAM: CHEST - 2 VIEW COMPARISON:  12/04/2020 FINDINGS: Cardiomediastinal silhouette unchanged in size and contour. No evidence of central vascular congestion. No interlobular septal thickening. Diffuse reticulonodular opacities in  the mid and lower lungs with bronchial wall thickening. No pneumothorax or pleural effusion. No acute displaced fracture. Degenerative changes of the spine. IMPRESSION: Reticulonodular opacities in the lower lungs and bronchial wall thickening, suggesting bronchopneumonia or other atypical infection Electronically Signed   By: Gilmer Mor D.O.   On: 07/30/2021 16:10  ? ?CT Angio Chest PE W and/or Wo Contrast ? ?Result Date: 08/28/2021 ?CLINICAL DATA:  History of dyspnea on exertion, pulmonary embolism suspected EXAM: CT ANGIOGRAPHY CHEST WITH CONTRAST TECHNIQUE: Multidetector CT imaging of the chest was performed using the standard protocol during bolus administration of intravenous contrast. Multiplanar CT image reconstructions and  MIPs were obtained to evaluate the vascular anatomy. RADIATION DOSE REDUCTION: This exam was performed according to the departmental dose-optimization program which includes automated exposure control, adjustment of the mA and/or kV according to patient size and/or use of iterative reconstruction technique. CONTRAST:  OMNIPAQUE IOHEXOL 350 MG/ML SOLN COMPARISON:  09/03/2019, 08/27/2021 FINDINGS: Cardiovascular: There is technically suboptimal opacification of the pulmonary vasculature due to timing of contrast bolus. There is sufficient contrast enhancement to exclude central and proximal segmental pulmonary emboli. Opacification of the subsegmental branches is insufficient for evaluation of pulmonary emboli. The heart is unremarkable without pericardial effusion. No evidence of thoracic aortic aneurysm. Mild atherosclerosis at the origin of the great vessels. Minimal coronary artery atherosclerosis. Mediastinum/Nodes: No enlarged mediastinal, hilar, or axillary lymph nodes. Thyroid gland, trachea, and esophagus demonstrate no significant findings. Lungs/Pleura: No acute airspace disease, effusion, or pneumothorax. Central airways are patent. Upper Abdomen: Hepatic steatosis. No acute upper abdominal findings. Musculoskeletal: No acute or destructive bony lesions. Reconstructed images demonstrate no additional findings. Review of the MIP images confirms the above findings. IMPRESSION: 1. No evidence of central or segmental pulmonary emboli. Timing of contrast bolus results in suboptimal opacification of the subsegmental branches of the pulmonary artery. 2. No acute intrathoracic process. 3. Hepatic steatosis. 4. Aortic Atherosclerosis (ICD10-I70.0). Coronary artery atherosclerosis. Electronically Signed   By: Sharlet Salina M.D.   On: 08/28/2021 16:16    ?ED Course / MDM  ? ?Clinical Course as of 08/28/21 1759  ?Sat Aug 28, 2021  ?1512 FU on CTA, if neg dc home [BH]  ?  ?Clinical Course User Index ?[BH]  Taven Strite A, PA-C  ? ? ?46 year old here for evaluation of shortness of breath, DOE and chest tightness over the last month since being diagnosed with COVID.  Subsequently had pneumonia treated with antibiotics her symptoms returned.  Was seen by outpatient provider had chest x-ray which showed atelectasis.  They referred her here for CTA to rule out PE.  Did have some elevated LFTs her previous provider was not concerned with intra-abdominal process. ? ?Labs and imaging personally viewed and interpreted: ? ?CBC without significant abnormality ?CMP glucose 344 ( on steroids) no elevated anion gap, normal bicarb, low suspicion for DKA, HHS, will give IV fluids does have elevated LFTs, abdomen soft, nontender, low suspicion for cholecystitis, cholelithiasis, choledocholithiasis ?EKG without ischemic changes ?CTA  neg for acute process ? ?Discussed results with patient.  Unfortunately I think her symptoms are likely due to long COVID however due to her chest tightness, risk factors will refer outpatient for further work-up.  At this time I do not feel she needs admission, she has no hypoxia, tachypnea or tachycardia here in the emergency department.  Does not appear grossly fluid overloaded.  Symptoms do not seem consistent with ACS, PE, dissection, bacterial infectious process, fluid overload, unstable angina, AAA, dissection,  acute intra-abdominal process ? ?The patient has been appropriately medically screened and/or stabilized in the ED. I have low suspicion for any other emergent medical condition which would require further screening, evaluation or treatment in the ED or require inpatient management. ? ?Patient is hemodynamically stable and in no acute distress.  Patient able to ambulate in department prior to ED.  Evaluation does not show acute pathology that would require ongoing or additional emergent interventions while in the emergency department or further inpatient treatment.  I have discussed the  diagnosis with the patient and answered all questions.  Pain is been managed while in the emergency department and patient has no further complaints prior to discharge.  Patient is comfortable with plan discussed in

## 2021-08-28 NOTE — ED Provider Triage Note (Signed)
Emergency Medicine Provider Triage Evaluation Note ? ?Kelsey Vang , a 46 y.o. female  was evaluated in triage.  Pt complains of chest pain, shortness of breath. States that same began on Monday and has been worsening since. She states that she had pneumonia at the beginning of April and was given doxycycline and levaquin for same, finished this course of antibiotics with improvement of symptoms. States that her pain is substernal in nature and radiates to her back. States it is also exertional in nature with associated cough. States that last Thursday she had severe pain in her right calf which has since resolved. Was seen by her PCP yesterday and sent here for PE rule out. Also expresses concerns related to her blood sugar being more difficult to control while she is sick. She has been taking her insulin as prescribed with dose adjustments for high readings.  ? ?Review of Systems  ?Positive: Chest pain, shortness of breath ?Negative:  ? ?Physical Exam  ?BP 119/62   Pulse (!) 102   Temp 99.2 ?F (37.3 ?C) (Oral)   Resp 18   Ht 5\' 2"  (1.575 m)   Wt 120.2 kg   SpO2 97%   BMI 48.47 kg/m?  ?Gen:   Awake, no distress   ?Resp:  Normal effort  ?MSK:   Moves extremities without difficulty  ?Other:   ? ?Medical Decision Making  ?Medically screening exam initiated at 12:06 PM.  Appropriate orders placed.  Tomasina Keasling was informed that the remainder of the evaluation will be completed by another provider, this initial triage assessment does not replace that evaluation, and the importance of remaining in the ED until their evaluation is complete. ? ? ?  ?Kelsey Specking, PA-C ?08/28/21 1211 ? ?

## 2021-08-29 ENCOUNTER — Other Ambulatory Visit: Payer: Self-pay | Admitting: Internal Medicine

## 2021-08-29 DIAGNOSIS — Z794 Long term (current) use of insulin: Secondary | ICD-10-CM

## 2021-08-31 ENCOUNTER — Other Ambulatory Visit: Payer: Commercial Managed Care - HMO

## 2021-09-09 ENCOUNTER — Telehealth: Payer: Self-pay | Admitting: *Deleted

## 2021-09-09 NOTE — Telephone Encounter (Signed)
Called [facility] and spoke with Colletta Maryland, she verified that the patient had not received her new CPAP yet, but it should be soon.  I called and spoke with the patient, she stated she had not received her new CPAP machine and she developed pna when she was scheduled to go for her mask fitting.  She rescheduled for 6/19 and she has a f/u with Dr. Annamaria Boots for 6/30.  She is going to keep her appointment with [facility] for tomorrow because she was in the ER with sob and she needs to be seen for covid related lung changes.  Advised we would see her tomorrow.  She verbalized understanding.  Nothing further needed.

## 2021-09-10 ENCOUNTER — Ambulatory Visit (INDEPENDENT_AMBULATORY_CARE_PROVIDER_SITE_OTHER): Payer: Commercial Managed Care - HMO | Admitting: Primary Care

## 2021-09-10 ENCOUNTER — Encounter: Payer: Self-pay | Admitting: Primary Care

## 2021-09-10 VITALS — BP 118/78 | HR 92 | Temp 97.8°F | Ht 62.0 in | Wt 269.8 lb

## 2021-09-10 DIAGNOSIS — R0609 Other forms of dyspnea: Secondary | ICD-10-CM | POA: Diagnosis not present

## 2021-09-10 DIAGNOSIS — G4733 Obstructive sleep apnea (adult) (pediatric): Secondary | ICD-10-CM | POA: Diagnosis not present

## 2021-09-10 DIAGNOSIS — Z720 Tobacco use: Secondary | ICD-10-CM

## 2021-09-10 DIAGNOSIS — R0602 Shortness of breath: Secondary | ICD-10-CM | POA: Diagnosis not present

## 2021-09-10 MED ORDER — FLUTICASONE FUROATE-VILANTEROL 100-25 MCG/ACT IN AEPB: 1.0000 | INHALATION_SPRAY | Freq: Every day | RESPIRATORY_TRACT | 0 refills | Status: DC

## 2021-09-10 NOTE — Patient Instructions (Signed)
Pleasure meeting you today Kelsey Vang  Recommend we check pulmonary function testing due to your history of COVID and smoking  I would like to try try you on a sample of Breo 100 mcg 1 puff daily in the morning x2 weeks (if you feel this helps your breathing call us and we can send in a prescription or provide you with another sample until follow-up with Dr. Maple Hudson)  Orders Pulmonary function testing re: shortness of breath/current smoker  Follow-up Please schedule 1 hour PFT prior to follow-up end of June with Dr. Maple Hudson

## 2021-09-10 NOTE — Progress Notes (Signed)
'@Patient'  ID: Kelsey Vang, female    DOB: [birth year], 46 y.o.   MRN: [id number]  Chief Complaint  Patient presents with   Follow-up    Referring provider: Nche, Charlene Brooke, NP  HPI: 46 year old female.  Past medical history significant for OSA, hypertension, migraine, chronic sinusitis, type 2 diabetes, gastroparesis, GERD, IBS, optic neuropathy, peripheral neuropathy, chronic kidney disease, hyperlipidemia, morbid obesity.  ED visit 08/28/21 46 year old here for evaluation of shortness of breath, DOE and chest tightness over the last month since being diagnosed with COVID.  Subsequently had pneumonia treated with antibiotics her symptoms returned.  Was seen by outpatient provider had chest x-ray which showed atelectasis.  They referred her here for CTA to rule out PE.  Did have some elevated LFTs her previous provider was not concerned with intra-abdominal process.   Labs and imaging personally viewed and interpreted:   CBC without significant abnormality CMP glucose 344 ( on steroids) no elevated anion gap, normal bicarb, low suspicion for DKA, HHS, will give IV fluids does have elevated LFTs, abdomen soft, nontender, low suspicion for cholecystitis, cholelithiasis, choledocholithiasis EKG without ischemic changes CTA  neg for acute process   Discussed results with patient.  Unfortunately I think her symptoms are likely due to [place] however due to her chest tightness, risk factors will refer outpatient for further work-up.  At this time I do not feel she needs admission, she has no hypoxia, tachypnea or tachycardia here in the emergency department.  Does not appear grossly fluid overloaded.  Symptoms do not seem consistent with ACS, PE, dissection, bacterial infectious process, fluid overload, unstable angina, AAA, dissection, acute intra-abdominal process   The patient has been appropriately medically screened and/or stabilized in the ED. I have low suspicion for any other  emergent medical condition which would require further screening, evaluation or treatment in the ED or require inpatient management.   Patient is hemodynamically stable and in no acute distress.  Patient able to ambulate in department prior to ED.  Evaluation does not show acute pathology that would require ongoing or additional emergent interventions while in the emergency department or further inpatient treatment.  I have discussed the diagnosis with the patient and answered all questions.  Pain is been managed while in the emergency department and patient has no further complaints prior to discharge.  Patient is comfortable with plan discussed in room and is stable for discharge at this time.  I have discussed strict return precautions for returning to the emergency department.  Patient was encouraged to follow-up with PCP/specialist refer to at discharge.     09/10/2021 Patient was recently seen in the ED earlier this month.  She continues to have persistent chest tightness and and shortness of breath.  She is followed by Dr. Annamaria Boots for obstructive sleep apnea, she was last seen in March.  During that visit she was not wearing her CPAP, she had significant issue with mask fit.  She was ordered for a new machine and pressure settings were changed to auto setting 5-15 along with mask fit. She had a sleep study in 2016 with [facility].  She is a current smoker.  No pulmonary function testing on file.   She has covid 1 month ago/pneumonia. She has had persistent shortness of breath and chest tightness for the last month. She has chronic sinus symptoms. No significant cough. She had not been paying much attention to her breathing prior to this but states she has had covid 6 times. Her  last covid dx was December 2022 and then tested positive again in April. CTA 08/28/21 showed no acute airspace disease, effusion or pneumothorax. Central airways patent. No evidence of PE.     Allergies  Allergen Reactions    Metformin And Related Other (See Comments)    GI upset   Metoclopramide Other (See Comments)    headache   Gabapentin Other (See Comments)    hallucination   Keflex [Cephalexin] Rash   Levemir [Insulin Detemir] Dermatitis    Whelps at injection site   Prednisone Palpitations    oral    Immunization History  Administered Date(s) Administered   Hepatitis B, adult 05/03/2013, 05/31/2013, 08/27/2013   Hepatitis B, ped/adol 05/03/2013, 05/31/2013, 08/27/2013   Influenza Split 12/27/2013   Influenza, Seasonal, Injecte, Preservative Fre 01/15/2015   Influenza,inj,Quad PF,6+ Mos 01/15/2015, 02/04/2019, 01/04/2021   Influenza-Unspecified 12/27/2013, 01/15/2015, 12/09/2017, 01/23/2019   MMR 08/27/2013   PFIZER(Purple Top)SARS-COV-2 Vaccination 08/20/2019, 03/27/2020   PPD Test 12/27/2013, 01/15/2014   Pneumococcal Polysaccharide-23 10/07/2014   Tdap 10/07/2014   Varicella 05/03/2013, 08/27/2013    Past Medical History:  Diagnosis Date   Anxiety    Phreesia 06/09/2020   Chronic back pain    Depression    Phreesia 06/09/2020   Diabetes mellitus without complication (HCC)    Gastroparesis due to DM (HCC)    GERD (gastroesophageal reflux disease)    Phreesia 06/09/2020   Hx of migraines    Hyperlipemia    Hyperlipidemia    Phreesia 06/09/2020   Hypertension    IBS (irritable bowel syndrome)    Nonarteritic ischemic optic neuropathy, unspecified laterality    Peripheral neuropathy    Sinusitis, chronic 01/17/2019   Sleep apnea    Phreesia 06/09/2020    Tobacco History: Social History   Tobacco Use  Smoking Status Every Day   Packs/day: 1.00   Years: 20.00   Total pack years: 20.00   Types: Cigarettes  Smokeless Tobacco Never   Ready to quit: Not Answered Counseling given: Not Answered   Outpatient Medications Prior to Visit  Medication Sig Dispense Refill   cyclobenzaprine (FLEXERIL) 10 MG tablet Take 10 mg by mouth 2 (two) times daily as needed for muscle  spasms. (Patient not taking: Reported on 10/30/2021)     diphenoxylate-atropine (LOMOTIL) 2.5-0.025 MG tablet Take 1 tablet by mouth 4 (four) times daily as needed for diarrhea or loose stools.     empagliflozin (JARDIANCE) 10 MG TABS tablet Take 1 tablet (10 mg total) by mouth daily before breakfast. 90 tablet 3   Erenumab-aooe (AIMOVIG) 140 MG/ML SOAJ Inject 140 mg into the skin every 28 (twenty-eight) days. 1.12 mL 11   escitalopram (LEXAPRO) 10 MG tablet Take 10 mg by mouth at bedtime.     famotidine (PEPCID) 40 MG tablet Take 40 mg by mouth 2 (two) times daily.     fluticasone (FLONASE) 50 MCG/ACT nasal spray Place 2 sprays into both nostrils 2 (two) times daily.     glucose blood (FREESTYLE PRECISION NEO TEST) test strip Use as instructed to check blood sugar 4X daily 400 each 3   guaiFENesin (MUCINEX) 600 MG 12 hr tablet Take 600 mg by mouth 2 (two) times daily.     hydrOXYzine (ATARAX) 25 MG tablet Take 25 mg by mouth daily as needed.     Hyprom-Naphaz-Polysorb-Zn Sulf (CLEAR EYES COMPLETE OP) Place 1 drop into both eyes daily as needed.     ibuprofen (ADVIL) 200 MG tablet Take 800 mg by mouth every  12 (twelve) hours as needed for moderate pain.     Insulin Glargine (BASAGLAR KWIKPEN) 100 UNIT/ML Inject 35-45 Units into the skin See admin instructions. Take 35 units in the morning and 45 units at night     ondansetron (ZOFRAN) 4 MG tablet Take 1 tablet (4 mg total) by mouth every 8 (eight) hours as needed for nausea or vomiting. (Patient not taking: Reported on 10/30/2021) 20 tablet 0   pantoprazole (PROTONIX) 40 MG tablet Take 40 mg by mouth 2 (two) times daily.     polycarbophil (FIBERCON) 625 MG tablet Take 1,250 mg by mouth in the morning and at bedtime.     pregabalin (LYRICA) 150 MG capsule Take 150 mg by mouth daily.     promethazine (PHENERGAN) 12.5 MG tablet Take 12.5 mg by mouth every 6 (six) hours as needed for nausea or vomiting. (Patient not taking: Reported on 10/30/2021)      QUEtiapine (SEROQUEL) 50 MG tablet Take 50 mg by mouth at bedtime.     Ubrogepant (UBRELVY) 100 MG TABS Take 100 mg by mouth as needed (take 1 tablet at the earlist onset of Migraine. May repeat in 2 hours. max 2 tabs in 24 hours). (Patient taking differently: Take 100 mg by mouth daily as needed (take 1 tablet at the earlist onset of Migraine. May repeat in 2 hours. max 2 tabs in 24 hours).) 16 tablet 5   UNIFINE PENTIPS 32G X 4 MM MISC USE TO INJECT INSULIN 4 TIMES DAILY 400 each 5   Vitamin D, Ergocalciferol, (DRISDOL) 1.25 MG (50000 UNIT) CAPS capsule Take 1 capsule (50,000 Units total) by mouth every 7 (seven) days. (Patient taking differently: Take 50,000 Units by mouth every 7 (seven) days. Mondays) 12 capsule 0   acetaminophen (TYLENOL) 500 MG tablet Take 1,000 mg by mouth every 6 (six) hours as needed for moderate pain. (Patient not taking: Reported on 10/25/2021)     albuterol (VENTOLIN HFA) 108 (90 Base) MCG/ACT inhaler Inhale 2 puffs into the lungs every 6 (six) hours as needed for wheezing or shortness of breath. 8 g 0   busPIRone (BUSPAR) 15 MG tablet Take 15 mg by mouth daily. (Patient not taking: Reported on 10/30/2021)     Continuous Blood Gluc Receiver (FREESTYLE LIBRE 2 READER) DEVI 1 each by Does not apply route daily. 1 each 0   Continuous Blood Gluc Sensor (FREESTYLE LIBRE 2 SENSOR) MISC 1 each by Does not apply route every 14 (fourteen) days. 6 each 3   HUMALOG KWIKPEN 100 UNIT/ML KwikPen INJECT 5 TO 15 UNITS SUBCUTANEOUSLY THREE TIMES DAILY 15 mL 0   aspirin EC 81 MG tablet Take 81 mg by mouth daily. Swallow whole.     Insulin Lispro-aabc (LYUMJEV KWIKPEN) 200 UNIT/ML KwikPen Inject 25-30 Units into the skin 3 (three) times daily before meals. 15 mL 3   mupirocin cream (BACTROBAN) 2 % Apply 1 application. topically 2 (two) times daily. 15 g 0   benzonatate (TESSALON) 100 MG capsule Take 1 capsule (100 mg total) by mouth 2 (two) times daily as needed for cough. (Patient not taking:  Reported on 10/25/2021) 30 capsule 0   No facility-administered medications prior to visit.    Review of Systems  Review of Systems  Constitutional: Negative.   HENT: Negative.    Respiratory:  Positive for chest tightness and shortness of breath.     Physical Exam  BP 118/78 (BP Location: Left Arm, Cuff Size: Normal)   Pulse 92  Temp 97.8 F (36.6 C)   Ht '5\' 2"'  (1.575 m)   Wt 269 lb 12.8 oz (122.4 kg)   SpO2 97%   BMI 49.35 kg/m  Physical Exam Constitutional:      General: She is not in acute distress.    Appearance: Normal appearance. She is obese. She is not ill-appearing.  HENT:     Head: Normocephalic and atraumatic.     Mouth/Throat:     Mouth: Mucous membranes are moist.     Pharynx: Oropharynx is clear.  Cardiovascular:     Rate and Rhythm: Normal rate and regular rhythm.  Pulmonary:     Effort: Pulmonary effort is normal.     Breath sounds: Normal breath sounds. No wheezing, rhonchi or rales.  Musculoskeletal:        General: Normal range of motion.  Skin:    General: Skin is warm.  Neurological:     General: No focal deficit present.     Mental Status: She is alert and oriented to person, place, and time. Mental status is at baseline.  Psychiatric:        Mood and Affect: Mood normal.        Behavior: Behavior normal.        Thought Content: Thought content normal.        Judgment: Judgment normal.      Lab Results:  CBC    Component Value Date/Time   WBC 13.3 (H) 08/28/2021 1129   RBC 5.17 (H) 08/28/2021 1129   HGB 16.0 (H) 08/28/2021 1129   HGB 14.4 10/02/2018 1558   HCT 47.6 (H) 08/28/2021 1129   HCT 43.3 10/02/2018 1558   PLT 227 08/28/2021 1129   PLT 352 10/02/2018 1558   MCV 92.1 08/28/2021 1129   MCV 91 10/02/2018 1558   MCH 30.9 08/28/2021 1129   MCHC 33.6 08/28/2021 1129   RDW 14.5 08/28/2021 1129   RDW 13.6 10/02/2018 1558   LYMPHSABS 3.6 09/03/2019 1133   LYMPHSABS 3.4 (H) 03/04/2017 1259   MONOABS 0.6 09/03/2019 1133    EOSABS 0.2 09/03/2019 1133   EOSABS 0.9 (H) 03/04/2017 1259   BASOSABS 0.1 09/03/2019 1133   BASOSABS 0.0 03/04/2017 1259    BMET    Component Value Date/Time   NA 135 08/28/2021 1207   NA 136 10/02/2018 1558   K 4.1 08/28/2021 1207   CL 102 08/28/2021 1207   CO2 22 08/28/2021 1207   GLUCOSE 344 (H) 08/28/2021 1207   BUN 10 08/28/2021 1207   BUN 9 10/02/2018 1558   CREATININE 0.84 08/28/2021 1207   CREATININE 0.69 06/11/2021 1451   CALCIUM 8.7 (L) 08/28/2021 1207   GFRNONAA >60 08/28/2021 1207   GFRAA >60 09/03/2019 1133    BNP    Component Value Date/Time   BNP 13.3 09/03/2019 1133    ProBNP No results found for: "PROBNP"  Imaging: No results found.   Assessment & Plan:   DOE (dyspnea on exertion) - Patient has symptoms of dyspnea on exertion along with chest tightness. CTA on 08/28/21 negative for PE. No acute airspace disease. Recommend checking pulmonary function testing due to history of COVID and smoking. Starting BREO 135mg one puff daily x 2 weeks.  Obstructive sleep apnea syndrome - Poor compliance d.t issues with mask fit. Ordered for new CPAP machine back in March, pressure changed to auto 5-15cm h20 and needs mask fitting      EMartyn Ehrich NP 11/25/2021

## 2021-09-14 ENCOUNTER — Encounter: Payer: Self-pay | Admitting: Nurse Practitioner

## 2021-09-14 DIAGNOSIS — F411 Generalized anxiety disorder: Secondary | ICD-10-CM

## 2021-09-14 DIAGNOSIS — F339 Major depressive disorder, recurrent, unspecified: Secondary | ICD-10-CM

## 2021-09-16 ENCOUNTER — Other Ambulatory Visit: Payer: Self-pay | Admitting: Internal Medicine

## 2021-09-16 ENCOUNTER — Other Ambulatory Visit: Payer: Self-pay | Admitting: Nurse Practitioner

## 2021-09-16 DIAGNOSIS — Z794 Long term (current) use of insulin: Secondary | ICD-10-CM

## 2021-09-16 DIAGNOSIS — E559 Vitamin D deficiency, unspecified: Secondary | ICD-10-CM

## 2021-09-18 ENCOUNTER — Encounter: Payer: Self-pay | Admitting: Internal Medicine

## 2021-09-21 ENCOUNTER — Other Ambulatory Visit (HOSPITAL_COMMUNITY): Payer: Self-pay

## 2021-09-21 ENCOUNTER — Telehealth: Payer: Self-pay | Admitting: Pharmacy Technician

## 2021-09-21 ENCOUNTER — Telehealth: Payer: Self-pay

## 2021-09-21 DIAGNOSIS — E1142 Type 2 diabetes mellitus with diabetic polyneuropathy: Secondary | ICD-10-CM

## 2021-09-21 NOTE — Addendum Note (Signed)
Addended by: Kenyon Ana on: 09/21/2021 04:42 PM   Modules accepted: Orders

## 2021-09-21 NOTE — Telephone Encounter (Signed)
Per Test claim, Apidra is $0 after an evoucher pays $50. If she's getting it at a pharmacy that doesn't use the evouchers, she can get a coupon from their website. We see that the pt's BS isn't being controled by the Humalog, but the plan wants her to fail all the alternatives. Would you be willing to switch her to the apidra?

## 2021-09-21 NOTE — Telephone Encounter (Signed)
Patient Advocate Encounter   Lyumjev u200 has already been denied, b/c the pt hasn't tried & failed, have contraindication to, or intolerance to all the following: Apidra, Humalog (or generic), Novolog ( or generic); or the individual is using a pump that's not compatible with the formulary alternatives.

## 2021-09-21 NOTE — Telephone Encounter (Signed)
Yes, OK. Kelsey Vang, can you please send 1 box (5 refills) of Apidra pens - same dose. This is also injected 15 min before meals.

## 2021-09-21 NOTE — Telephone Encounter (Signed)
Completed in separate encounter

## 2021-09-24 ENCOUNTER — Telehealth: Payer: Managed Care, Other (non HMO) | Admitting: Neurology

## 2021-09-24 MED ORDER — HUMALOG KWIKPEN 200 UNIT/ML ~~LOC~~ SOPN: 25.0000 [IU] | PEN_INJECTOR | Freq: Three times a day (TID) | SUBCUTANEOUS | 1 refills | Status: DC

## 2021-09-24 NOTE — Telephone Encounter (Signed)
Humalog U200 rx sent to preferred pharmacy.

## 2021-09-24 NOTE — Addendum Note (Signed)
Addended by: Kenyon Ana on: 09/24/2021 12:18 PM   Modules accepted: Orders

## 2021-09-29 ENCOUNTER — Encounter: Payer: Self-pay | Admitting: Internal Medicine

## 2021-10-04 ENCOUNTER — Telehealth: Payer: Managed Care, Other (non HMO) | Admitting: Neurology

## 2021-10-04 ENCOUNTER — Ambulatory Visit (HOSPITAL_BASED_OUTPATIENT_CLINIC_OR_DEPARTMENT_OTHER): Payer: Commercial Managed Care - HMO | Attending: Internal Medicine | Admitting: Internal Medicine

## 2021-10-04 DIAGNOSIS — G4733 Obstructive sleep apnea (adult) (pediatric): Secondary | ICD-10-CM

## 2021-10-15 ENCOUNTER — Ambulatory Visit (INDEPENDENT_AMBULATORY_CARE_PROVIDER_SITE_OTHER): Payer: Commercial Managed Care - HMO | Admitting: Internal Medicine

## 2021-10-15 ENCOUNTER — Other Ambulatory Visit: Payer: Self-pay

## 2021-10-15 ENCOUNTER — Ambulatory Visit: Payer: Managed Care, Other (non HMO) | Admitting: Internal Medicine

## 2021-10-15 ENCOUNTER — Encounter: Payer: Self-pay | Admitting: Internal Medicine

## 2021-10-15 DIAGNOSIS — Z720 Tobacco use: Secondary | ICD-10-CM

## 2021-10-15 DIAGNOSIS — Z794 Long term (current) use of insulin: Secondary | ICD-10-CM

## 2021-10-15 DIAGNOSIS — R0602 Shortness of breath: Secondary | ICD-10-CM

## 2021-10-15 LAB — PULMONARY FUNCTION TEST
DL/VA % pred: 82 %
DL/VA: 3.64 ml/min/mmHg/L
DLCO cor % pred: 67 %
DLCO cor: 13.55 ml/min/mmHg
DLCO unc % pred: 72 %
DLCO unc: 14.52 ml/min/mmHg
FEF 25-75 Post: 2.87 L/sec
FEF 25-75 Pre: 2.76 L/sec
FEF2575-%Change-Post: 4 %
FEF2575-%Pred-Post: 101 %
FEF2575-%Pred-Pre: 97 %
FEV1-%Change-Post: 0 %
FEV1-%Pred-Post: 83 %
FEV1-%Pred-Pre: 83 %
FEV1-Post: 2.28 L
FEV1-Pre: 2.29 L
FEV1FVC-%Change-Post: 1 %
FEV1FVC-%Pred-Pre: 105 %
FEV6-%Change-Post: -1 %
FEV6-%Pred-Post: 79 %
FEV6-%Pred-Pre: 80 %
FEV6-Post: 2.63 L
FEV6-Pre: 2.66 L
FEV6FVC-%Pred-Post: 102 %
FEV6FVC-%Pred-Pre: 102 %
FVC-%Change-Post: -1 %
FVC-%Pred-Post: 77 %
FVC-%Pred-Pre: 78 %
FVC-Post: 2.63 L
FVC-Pre: 2.67 L
Post FEV1/FVC ratio: 87 %
Post FEV6/FVC ratio: 100 %
Pre FEV1/FVC ratio: 86 %
Pre FEV6/FVC Ratio: 100 %
RV % pred: 97 %
RV: 1.55 L
TLC % pred: 90 %
TLC: 4.31 L

## 2021-10-15 MED ORDER — FREESTYLE LIBRE 2 SENSOR MISC: 1.0000 | 3 refills | Status: DC

## 2021-10-15 NOTE — Progress Notes (Signed)
PFT done today. 

## 2021-10-18 ENCOUNTER — Telehealth: Payer: Commercial Managed Care - HMO | Admitting: Nurse Practitioner

## 2021-10-18 ENCOUNTER — Telehealth: Payer: Commercial Managed Care - HMO | Admitting: Physician Assistant

## 2021-10-18 ENCOUNTER — Telehealth: Payer: Commercial Managed Care - HMO

## 2021-10-18 DIAGNOSIS — R3989 Other symptoms and signs involving the genitourinary system: Secondary | ICD-10-CM | POA: Diagnosis not present

## 2021-10-18 MED ORDER — SULFAMETHOXAZOLE-TRIMETHOPRIM 800-160 MG PO TABS: 1.0000 | ORAL_TABLET | Freq: Two times a day (BID) | ORAL | 0 refills | Status: DC

## 2021-10-18 NOTE — Progress Notes (Signed)

## 2021-10-20 ENCOUNTER — Telehealth: Payer: Self-pay | Admitting: Primary Care

## 2021-10-20 DIAGNOSIS — G4733 Obstructive sleep apnea (adult) (pediatric): Secondary | ICD-10-CM

## 2021-10-21 NOTE — Telephone Encounter (Signed)
Spoke with Judeth Cornfield with [facility]. She states that they are unable to process CPAP order due to non compliance. If we can not update her last note to show she is compliant, she will have to have repeat study. Please advise, thanks!

## 2021-10-22 ENCOUNTER — Ambulatory Visit: Payer: Commercial Managed Care - HMO | Admitting: Internal Medicine

## 2021-10-22 DIAGNOSIS — K582 Mixed irritable bowel syndrome: Secondary | ICD-10-CM | POA: Insufficient documentation

## 2021-10-22 DIAGNOSIS — R1319 Other dysphagia: Secondary | ICD-10-CM | POA: Insufficient documentation

## 2021-10-22 NOTE — Progress Notes (Unsigned)
Virtual Visit via Video Note  Consent was obtained for video visit:  Yes.   Answered questions that patient had about telehealth interaction:  Yes.   I discussed the limitations, risks, security and privacy concerns of performing an evaluation and management service by telemedicine. I also discussed with the patient that there may be a patient responsible charge related to this service. The patient expressed understanding and agreed to proceed.  Pt location: Home Physician Location: office Name of referring provider:  Nche, Bonna Gains, NP I connected with [name] at patients initiation/request on 10/25/2021 at  9:10 AM EDT by video enabled telemedicine application and verified that I am speaking with the correct person using two identifiers. Pt MRN:  [id number] Pt DOB:  [birth year] Video Participants:  [name]  Assessment and Plan:   Bilateral non-arteritic optic neuropathy - likely due to controlled diabetes Chronic migraine with aura, without status migrainosus, not intractable - over 15 headache days a month for over 3 consecutive months - failed Aimovig, beta blocker, amitriptyline, topiramate - candidate for Botox Obstructive sleep apnea     Migraine prevention:  Stop Aimovig.  Will plan to start Botox Migraine rescue:  Ubrelvy 50mg  Advised to start ASA 81mg  daily and optimize glycemic control and cholesterol management Limit use of pain relievers to no more than 2 days out of week to prevent risk of rebound or medication-overuse headache. Keep headache diary Follow up for Botox  History of Present Illness:  Kelsey Vang is a 46 year old female with diabetes who follows up for bilateral non-arteritic optic neuropathy and migraines.   UPDATE: Migraines: She has lost a majority in her vision (lost all vision in left eye and minimal vision in right eye) - causes eye strain.  Eyes are causing her to have frequent headaches.  Her gastroenterologist started her on  erythromycin for a month for IBS and due to potential interaction advised to reduce Ubrelvy to 50mg . She has daily headaches. As for migraines, Started Aimovig and Chriss Driver. Intensity:  severe Duration:  1 day Frequency:  2-3 times a week Having a tension type headache in back of neck and head daily.  - treats with lidocaine roll on and Flexeril at bedtime (mostly for back) Frequency of abortive medication: Excedrin 3 days a week. Current NSAIDS: ibuprofen 800mg  BID (daily for back) Current analgesics: none Current triptans: None Current ergotamine: None Current anti-emetic: None Current muscle relaxants: Zanflex 2mg  (just prescribed today for back pain) Current anti-anxiolytic: None Current sleep aide: None Current Antihypertensive medications: Metoprolol, lisinopril Current Antidepressant medications: sertraline Current Anticonvulsant medications: None Current anti-CGRP:Aimovig 140mg , Ubrelvy 100mg  Current Vitamins/Herbal/Supplements: None Current Antihistamines/Decongestants: Flonase, Zyrtec Other therapy: None Hormone/birth control: none   Caffeine:  No coffee.  Will drink caffeine with headache Alcohol: No Smoker: No Diet: Hydrates.  Does not skip meals Exercise: Walks Depression: No; Anxiety: No Other pain: back pain  Sleep hygiene: Poor.  Has OSA and compliant with CPAP   HISTORY: In October 2022, she developed blurred vision followed by darkening vision in the lower half of visual field her left eye.  No associated eye pain but she is on Lyrica for chronic back and leg pain.  She reports intermittent burst of blue and red colors in the left eye, and vision looks red with faded bluish green with eyes closed.  She went to her optometrist last week whose exam revealed left optic neuritis and disc hemorrhage with full visual fields and no APD.  Due to persistent vision loss, she received round of Solu-Medrol for optic neuritis.  No improvement, so she did receive Acthar but  also no improvement.  Underwent workup for optic neuritis: MRI of brain and orbits with and without contrast on 02/22/2021 was limited due to motion but revealed only mild nonspecific white matter T2/FLAIR hyperintensities but otherwise no obvious findings of MS or abnormal enhancement of brain and optic nerves.  MRI of cervical and thoracic spine with and without contrast on 02/24/2021 revealed mild spinal and bilateral foraminal stenosis at C6-7 and mild right foraminal narrowing at C4-5 and C5-6 but no spinal cord abnormalities such as demyelination.  She underwent LP on 03/01/2021 for CSF analysis which demonstrated opening pressure of 15.5 cm water, CSF cell count 3, protein 80 (patient diabetic), glucose 94 (patient diabetic), no oligoclonal bands, IgG 3, negative ACE, negative culture, negative cytology, nonreactive VDRL.  Serum labs include negative NMO IgG, negative ANA, sed rate 44, B12 438, ACE 36, vit A 64.  Due to insurance reasons, she was unable to get an ophthalmology appointment until 05/11/2021.  She saw Dr. Katy Fitch who actually noted bilateral non-arteritic optic neuropathy (each episode occurred after COVID).  We referred her to sleep medicine and she has upcoming appointment.  Hgb A1c from [place] was 8.9.   Migraines: Onset: Since late teens. Increased frequency for past year. Location:  diffuse Quality:  "lightening bolt" Initial intensity:  Severe..  Sometimes wakes her from sleep.  She denies new headache. Aura:  Occasionally may see flashing lights when her eyes are closed.  Difficult to focus Prodrome:  no Postdrome:  tired Associated symptoms: Nausea, vomiting, photophobia, phonophobia, osmophobia, blurred vision.  No autonomic symptoms.  She denies associated unilateral numbness or weakness. Initial duration:  With treatment, 2 hours severe but less severe for entire day (sometimes 2 days). Initial Frequency:  1 to 2 days a week but has constant dull left frontal/top  headache Initial Frequency of abortive medication: ibuprofen 3 days a week.  Sumatriptan 1 to 2 days a week. Triggers: None Relieving factors:  rest Activity:  Cannot function at all once every few months.   Past NSAIDS:  no Past analgesics:  Tylenol, Fioricet Past abortive triptans:  Maxalt, sumatriptan - contraindicated (ischemic optic neuropathy) Past muscle relaxants:  Flexeril Past anti-emetic:  no Past antihypertensive medications:  no Past antidepressant medications:   Amitriptyline 100 mg (ineffective, cause palpitations) Past anticonvulsant medications:  Lyrica, topiramate (hallucinations), gabapentin (hallucinations) Past anti-CGRP:  Ajovy (insurance issue) Past vitamins/Herbal/Supplements:  no Past antihistamines/decongestants:  no Other past therapies:  no   Family history of headache:  mother    Past Medical History: Past Medical History:  Diagnosis Date   Anxiety    [employer] 06/09/2020   Chronic back pain    Depression    [employer] 06/09/2020   Diabetes mellitus without complication ([place])    Gastroparesis due to DM ([place])    GERD (gastroesophageal reflux disease)    [employer] 06/09/2020   Hx of migraines    Hyperlipemia    Hyperlipidemia    [employer] 06/09/2020   Hypertension    IBS (irritable bowel syndrome)    Nonarteritic ischemic optic neuropathy, unspecified laterality    Peripheral neuropathy    Sinusitis, chronic 01/17/2019   Sleep apnea    [employer] 06/09/2020    Medications: Outpatient Encounter Medications as of 10/25/2021  Medication Sig Note   acetaminophen (TYLENOL) 500 MG tablet Take 1,000 mg by mouth every 6 (six) hours as  needed for moderate pain.    albuterol (VENTOLIN HFA) 108 (90 Base) MCG/ACT inhaler Inhale 2 puffs into the lungs every 6 (six) hours as needed for wheezing or shortness of breath.    aspirin EC 81 MG tablet Take 81 mg by mouth daily. Swallow whole. (Patient not taking: Reported on 09/10/2021)    benzonatate (TESSALON) 100  MG capsule Take 1 capsule (100 mg total) by mouth 2 (two) times daily as needed for cough. (Patient not taking: Reported on 09/10/2021)    busPIRone (BUSPAR) 15 MG tablet Take 15 mg by mouth 2 (two) times daily.    Continuous Blood Gluc Receiver (FREESTYLE LIBRE 2 READER) DEVI 1 each by Does not apply route daily.    Continuous Blood Gluc Sensor (FREESTYLE LIBRE 2 SENSOR) MISC 1 each by Does not apply route every 14 (fourteen) days.    cyclobenzaprine (FLEXERIL) 10 MG tablet Take 10 mg by mouth 2 (two) times daily as needed for muscle spasms.    diphenoxylate-atropine (LOMOTIL) 2.5-0.025 MG tablet Take 1 tablet by mouth 4 (four) times daily as needed for diarrhea or loose stools.    empagliflozin (JARDIANCE) 10 MG TABS tablet Take 1 tablet (10 mg total) by mouth daily before breakfast.    Erenumab-aooe (AIMOVIG) 140 MG/ML SOAJ Inject 140 mg into the skin every 28 (twenty-eight) days.    escitalopram (LEXAPRO) 10 MG tablet Take 10 mg by mouth at bedtime.    famotidine (PEPCID) 40 MG tablet Take 40 mg by mouth 2 (two) times daily.    fluticasone (FLONASE) 50 MCG/ACT nasal spray Place 2 sprays into both nostrils 2 (two) times daily.    glucose blood (FREESTYLE PRECISION NEO TEST) test strip Use as instructed to check blood sugar 4X daily    guaiFENesin (MUCINEX) 600 MG 12 hr tablet Take 600 mg by mouth 2 (two) times daily.    hydrOXYzine (ATARAX) 25 MG tablet Take 25 mg by mouth daily as needed.    Hyprom-Naphaz-Polysorb-Zn Sulf (CLEAR EYES COMPLETE OP) Place 1 drop into both eyes daily as needed.    ibuprofen (ADVIL) 200 MG tablet Take 800 mg by mouth every 6 (six) hours as needed for moderate pain.    Insulin Glargine (BASAGLAR KWIKPEN) 100 UNIT/ML Inject 35-45 Units into the skin See admin instructions. Take 35 units in the morning and 45 units at night    insulin lispro (HUMALOG KWIKPEN) 200 UNIT/ML KwikPen Inject 25-35 Units into the skin 3 (three) times daily before meals.    Insulin Lispro-aabc  (LYUMJEV KWIKPEN) 200 UNIT/ML KwikPen Inject 25-30 Units into the skin 3 (three) times daily before meals. (Patient not taking: Reported on 09/10/2021)    mupirocin cream (BACTROBAN) 2 % Apply 1 application. topically 2 (two) times daily. (Patient not taking: Reported on 09/10/2021)    ondansetron (ZOFRAN) 4 MG tablet Take 1 tablet (4 mg total) by mouth every 8 (eight) hours as needed for nausea or vomiting.    pantoprazole (PROTONIX) 40 MG tablet Take 40 mg by mouth 2 (two) times daily.    polycarbophil (FIBERCON) 625 MG tablet Take 1,250 mg by mouth in the morning and at bedtime.    pregabalin (LYRICA) 150 MG capsule Take 150 mg by mouth 2 (two) times daily.    promethazine (PHENERGAN) 12.5 MG tablet Take 12.5 mg by mouth every 6 (six) hours as needed for nausea or vomiting.    QUEtiapine (SEROQUEL) 50 MG tablet Take 50 mg by mouth at bedtime.    sulfamethoxazole-trimethoprim (BACTRIM  DS) 800-160 MG tablet Take 1 tablet by mouth 2 (two) times daily.    Ubrogepant (UBRELVY) 100 MG TABS Take 100 mg by mouth as needed (take 1 tablet at the earlist onset of Migraine. May repeat in 2 hours. max 2 tabs in 24 hours). (Patient taking differently: Take 100 mg by mouth daily as needed (take 1 tablet at the earlist onset of Migraine. May repeat in 2 hours. max 2 tabs in 24 hours).)    UNIFINE PENTIPS 32G X 4 MM MISC USE TO INJECT INSULIN 4 TIMES DAILY 11/14/2018: Needs refill   Vitamin D, Ergocalciferol, (DRISDOL) 1.25 MG (50000 UNIT) CAPS capsule Take 1 capsule (50,000 Units total) by mouth every 7 (seven) days. (Patient taking differently: Take 50,000 Units by mouth every 7 (seven) days. Mondays)    No facility-administered encounter medications on file as of 10/25/2021.    Allergies: Allergies  Allergen Reactions   Gabapentin Other (See Comments)    hallucination   Levemir [Insulin Detemir] Dermatitis    Whelps at injection site   Metformin And Related Other (See Comments)    GI upset   Metoclopramide  Other (See Comments)    headache   Keflex [Cephalexin] Rash   Prednisone Palpitations    oral    Family History: Family History  Problem Relation Age of Onset   Cancer Mother    Diabetes Father    Liver disease Father    Migraines Paternal Aunt    Heart attack Maternal Grandmother    Cancer Paternal Grandfather    Migraines Paternal Aunt     Observations/Objective:   Height 5\' 1"  (1.549 m), weight 266 lb (120.7 kg). No acute distress.  Alert and oriented.  Speech fluent and not dysarthric.  Language intact.  Eyes orthophoric on primary gaze.  Face symmetric.   Follow Up Instructions:    -I discussed the assessment and treatment plan with the patient. The patient was provided an opportunity to ask questions and all were answered. The patient agreed with the plan and demonstrated an understanding of the instructions.   The patient was advised to call back or seek an in-person evaluation if the symptoms worsen or if the condition fails to improve as anticipated.   , DO

## 2021-10-25 ENCOUNTER — Encounter: Payer: Self-pay | Admitting: Nurse Practitioner

## 2021-10-25 ENCOUNTER — Telehealth: Payer: Self-pay

## 2021-10-25 ENCOUNTER — Other Ambulatory Visit (HOSPITAL_COMMUNITY): Payer: Self-pay

## 2021-10-25 ENCOUNTER — Telehealth (INDEPENDENT_AMBULATORY_CARE_PROVIDER_SITE_OTHER): Payer: Commercial Managed Care - HMO | Admitting: Nurse Practitioner

## 2021-10-25 ENCOUNTER — Telehealth (INDEPENDENT_AMBULATORY_CARE_PROVIDER_SITE_OTHER): Payer: Commercial Managed Care - HMO | Admitting: Neurology

## 2021-10-25 ENCOUNTER — Encounter: Payer: Self-pay | Admitting: Neurology

## 2021-10-25 VITALS — Ht 61.0 in | Wt 266.0 lb

## 2021-10-25 DIAGNOSIS — R0609 Other forms of dyspnea: Secondary | ICD-10-CM | POA: Diagnosis not present

## 2021-10-25 DIAGNOSIS — G4733 Obstructive sleep apnea (adult) (pediatric): Secondary | ICD-10-CM

## 2021-10-25 DIAGNOSIS — G43109 Migraine with aura, not intractable, without status migrainosus: Secondary | ICD-10-CM

## 2021-10-25 DIAGNOSIS — R0602 Shortness of breath: Secondary | ICD-10-CM | POA: Diagnosis not present

## 2021-10-25 DIAGNOSIS — G43709 Chronic migraine without aura, not intractable, without status migrainosus: Secondary | ICD-10-CM

## 2021-10-25 DIAGNOSIS — H47013 Ischemic optic neuropathy, bilateral: Secondary | ICD-10-CM

## 2021-10-25 NOTE — Telephone Encounter (Signed)
Per today visit, Dr.Jaffe would like to have the patient start Botox.  Hopefully we can get patient scheduled for the next Botox day or close to it.   PA team if you could start a PA for Botox for his patient.

## 2021-10-25 NOTE — Assessment & Plan Note (Signed)
Persistent DOE since her most recent COVID infection/pneumonia in May. She has had COVID at least 6 times. CTA chest did not show any acute process or underlying lung disease. PFTs were reviewed today with no significant defect in lung function and volumes were normal. She did have a moderate diffusion defect with DLCO of 67% corrected for alveolar volume. Unclear etiology at this point. Recommended we check echocardiogram to assess for cardiac component or PAH. If exam is unremarkable, we will consider HRCT to evaluate for ILD. Recent CBC without anemia.   Patient Instructions  Continue Albuterol inhaler 2 puffs every 6 hours as needed for shortness of breath or wheezing. Notify if symptoms persist despite rescue inhaler/neb use. Continue Flonase 2 sprays each nostril twice daily Continue Pepcid 40 mg twice daily Continue Protonix 40 mg twice daily  Continue to use CPAP every night, minimum of 4-6 hours a night.  Change equipment every 30 days or as directed by DME. Wash your tubing with warm soap and water daily, hang to dry. Wash humidifier portion weekly.  Be aware of reduced alertness and do not drive or operate heavy machinery if experiencing this or drowsiness.  Exercise encouraged, as tolerated. Avoid or decrease alcohol consumption and medications that make you more sleepy, if possible. Notify if persistent daytime sleepiness occurs even with consistent use of CPAP.  Echocardiogram ordered for further evaluation  Follow up in one month with Dr. Maple Hudson. If symptoms do not improve or worsen, please contact office for sooner follow up or seek emergency care.

## 2021-10-25 NOTE — Patient Instructions (Signed)
Plan to start Botox

## 2021-10-25 NOTE — Patient Instructions (Signed)
Continue Albuterol inhaler 2 puffs every 6 hours as needed for shortness of breath or wheezing. Notify if symptoms persist despite rescue inhaler/neb use. Continue Flonase 2 sprays each nostril twice daily Continue Pepcid 40 mg twice daily Continue Protonix 40 mg twice daily  Continue to use CPAP every night, minimum of 4-6 hours a night.  Change equipment every 30 days or as directed by DME. Wash your tubing with warm soap and water daily, hang to dry. Wash humidifier portion weekly.  Be aware of reduced alertness and do not drive or operate heavy machinery if experiencing this or drowsiness.  Exercise encouraged, as tolerated. Avoid or decrease alcohol consumption and medications that make you more sleepy, if possible. Notify if persistent daytime sleepiness occurs even with consistent use of CPAP.  Echocardiogram ordered for further evaluation  Follow up in one month with Dr. Maple Hudson. If symptoms do not improve or worsen, please contact office for sooner follow up or seek emergency care.

## 2021-10-25 NOTE — Assessment & Plan Note (Signed)
Excellent compliance per her report and continues to receive good benefit. She has noticed that her fatigue is better when using CPAP and she feels more rested. Unable to get remote download and she does not have a SD card in her machine. She has yet to receive new machine from her DME; order previously sent but they needed updated note. Continue CPAP auto 5-15cmH2O. We will fax note from today to DME to see if we can get her a new machine.

## 2021-10-25 NOTE — Progress Notes (Signed)
Patient ID: Kelsey Vang, female     DOB: December 22, 1975, 46 y.o.      MRN: [id number]  Chief Complaint  Patient presents with   Follow-up    She is here to go over PFT results on 10/15/21. She reports shortness of breath with exertion,     Virtual Visit via Video Note  I connected with Kelsey Vang on 10/25/21 at  3:00 PM EDT by a video enabled telemedicine application and verified that I am speaking with the correct person using two identifiers.  Location: Patient: [name]: Office   I discussed the limitations of evaluation and management by telemedicine and the availability of in person appointments. The patient expressed understanding and agreed to proceed.  History of Present Illness: 46 year old female, active smoker followed for OSA on CPAP and DOE. She is a patient of Dr. Janee Vang and last seen in office 09/10/2021 by Kelsey Napoleon NP. Past medical history HTN, migraine, GERD, DM II, CKD, obesity, HLD, depression.   She has had multiple COVID infections, most recently in May 2023. She ended up having pneumonia and was treated with abx. Her symptoms returned afterwards and she was referred to pulmonary clinic. CTA was negative for PE and acute process. Symptoms suspected to be related to long COVID.   TEST/EVENTS:  08/28/2021 CTA chest: No PE but insufficient evaluation of subsegmental branches.  Atherosclerosis is present and mild.  There is no LAD.  There is no acute process in the lungs and central airways are patent. 10/15/2021 PFTs: FVC 78, FEV1 83, ratio 87, TLC 90, DLCOcor 67.  No BD  09/10/2021: OV with Kelsey Napoleon NP for ED f/u. Previously off CPAP; having significant issues with mask. She was ordered for a new machine and pressure settings changed to 5-15 cmH2O - hadn't received machine yet. She had COVID pneumonia approx 1 month ago; CTA negative. Has had persistent SOB and chest tightness since. Recommended PFTs. Trial on Breo x 2 weeks.   10/25/2021: Today - follow  up Patient presents today via virtual visit to review pulmonary function testing.  Spirometry was overall normal. No evidence of obstruction or restriction.  She did have a moderate diffusion defect.  Today, she reports feeling relatively the same as she did at last visit.  She tried the [facility] for around 2 weeks and did not feel any difference so she has since stopped it.  Continues to get short of breath with climbing and long distances.  Otherwise, she feels like her breathing is relatively stable.  Does have an occasional cough but unchanged from baseline; attributes this to her chronic sinusitis.  She is concerned that this is all related to her multiple COVID infections.  She denies any wheezing, swelling in her legs, palpitations.   Regarding her CPAP, she has restarted therapy.  Feels better on therapy and like she sleeps more restfully.  We were unable to review a download today but she reports that she is not having any trouble with it.  Denies morning headaches or drowsy driving.  Allergies  Allergen Reactions   Metformin And Related Other (See Comments)    GI upset   Metoclopramide Other (See Comments)    headache   Gabapentin Other (See Comments)    hallucination   Keflex [Cephalexin] Rash   Levemir [Insulin Detemir] Dermatitis    Whelps at injection site   Prednisone Palpitations    oral   Immunization History  Administered Date(s) Administered   Hepatitis B,  adult 05/03/2013, 05/31/2013, 08/27/2013   Hepatitis B, ped/adol 05/03/2013, 05/31/2013, 08/27/2013   Influenza Split 12/27/2013   Influenza, Seasonal, Injecte, Preservative Fre 01/15/2015   Influenza,inj,Quad PF,6+ Mos 01/15/2015, 02/04/2019, 01/04/2021   Influenza-Unspecified 12/27/2013, 01/15/2015, 12/09/2017, 01/23/2019   MMR 08/27/2013   PFIZER(Purple Top)SARS-COV-2 Vaccination 08/20/2019, 03/27/2020   PPD Test 12/27/2013, 01/15/2014   Pneumococcal Polysaccharide-23 10/07/2014   Tdap 10/07/2014   Varicella  05/03/2013, 08/27/2013   Past Medical History:  Diagnosis Date   Anxiety    Phreesia 06/09/2020   Chronic back pain    Depression    Phreesia 06/09/2020   Diabetes mellitus without complication (HCC)    Gastroparesis due to DM (HCC)    GERD (gastroesophageal reflux disease)    Phreesia 06/09/2020   Hx of migraines    Hyperlipemia    Hyperlipidemia    Phreesia 06/09/2020   Hypertension    IBS (irritable bowel syndrome)    Nonarteritic ischemic optic neuropathy, unspecified laterality    Peripheral neuropathy    Sinusitis, chronic 01/17/2019   Sleep apnea    Phreesia 06/09/2020    Tobacco History: Social History   Tobacco Use  Smoking Status Every Day   Packs/day: 1.00   Years: 20.00   Total pack years: 20.00   Types: Cigarettes  Smokeless Tobacco Never   Ready to quit: Not Answered Counseling given: Not Answered   Outpatient Medications Prior to Visit  Medication Sig Dispense Refill   albuterol (VENTOLIN HFA) 108 (90 Base) MCG/ACT inhaler Inhale 2 puffs into the lungs every 6 (six) hours as needed for wheezing or shortness of breath. 8 g 0   aspirin EC 81 MG tablet Take 81 mg by mouth daily. Swallow whole.     azithromycin (ZITHROMAX) 250 MG tablet Take 250 mg by mouth daily.     busPIRone (BUSPAR) 15 MG tablet Take 15 mg by mouth daily.     Continuous Blood Gluc Receiver (FREESTYLE LIBRE 2 READER) DEVI 1 each by Does not apply route daily. 1 each 0   Continuous Blood Gluc Sensor (FREESTYLE LIBRE 2 SENSOR) MISC 1 each by Does not apply route every 14 (fourteen) days. 6 each 3   cyclobenzaprine (FLEXERIL) 10 MG tablet Take 10 mg by mouth 2 (two) times daily as needed for muscle spasms.     diphenoxylate-atropine (LOMOTIL) 2.5-0.025 MG tablet Take 1 tablet by mouth 4 (four) times daily as needed for diarrhea or loose stools.     dronabinol (MARINOL) 2.5 MG capsule Take 2.5 mg by mouth 2 (two) times daily before a meal.     empagliflozin (JARDIANCE) 10 MG TABS tablet  Take 1 tablet (10 mg total) by mouth daily before breakfast. 90 tablet 3   Erenumab-aooe (AIMOVIG) 140 MG/ML SOAJ Inject 140 mg into the skin every 28 (twenty-eight) days. 1.12 mL 11   escitalopram (LEXAPRO) 10 MG tablet Take 10 mg by mouth at bedtime.     famotidine (PEPCID) 40 MG tablet Take 40 mg by mouth 2 (two) times daily.     fluticasone (FLONASE) 50 MCG/ACT nasal spray Place 2 sprays into both nostrils 2 (two) times daily.     glucose blood (FREESTYLE PRECISION NEO TEST) test strip Use as instructed to check blood sugar 4X daily 400 each 3   guaiFENesin (MUCINEX) 600 MG 12 hr tablet Take 600 mg by mouth 2 (two) times daily.     hydrOXYzine (ATARAX) 25 MG tablet Take 25 mg by mouth daily as needed.     hyoscyamine (  LEVBID) 0.375 MG 12 hr tablet Take 0.375 mg by mouth 2 (two) times daily.     Hyprom-Naphaz-Polysorb-Zn Sulf (CLEAR EYES COMPLETE OP) Place 1 drop into both eyes daily as needed.     ibuprofen (ADVIL) 200 MG tablet Take 800 mg by mouth every 12 (twelve) hours as needed for moderate pain.     Insulin Glargine (BASAGLAR KWIKPEN) 100 UNIT/ML Inject 35-45 Units into the skin See admin instructions. Take 35 units in the morning and 45 units at night     insulin lispro (HUMALOG KWIKPEN) 200 UNIT/ML KwikPen Inject 25-35 Units into the skin 3 (three) times daily before meals. 30 mL 1   Insulin Lispro-aabc (LYUMJEV KWIKPEN) 200 UNIT/ML KwikPen Inject 25-30 Units into the skin 3 (three) times daily before meals. 15 mL 3   mupirocin cream (BACTROBAN) 2 % Apply 1 application. topically 2 (two) times daily. 15 g 0   ondansetron (ZOFRAN) 4 MG tablet Take 1 tablet (4 mg total) by mouth every 8 (eight) hours as needed for nausea or vomiting. 20 tablet 0   pantoprazole (PROTONIX) 40 MG tablet Take 40 mg by mouth 2 (two) times daily.     polycarbophil (FIBERCON) 625 MG tablet Take 1,250 mg by mouth in the morning and at bedtime.     pregabalin (LYRICA) 150 MG capsule Take 150 mg by mouth daily.      promethazine (PHENERGAN) 12.5 MG tablet Take 12.5 mg by mouth every 6 (six) hours as needed for nausea or vomiting.     QUEtiapine (SEROQUEL) 50 MG tablet Take 50 mg by mouth at bedtime.     sulfamethoxazole-trimethoprim (BACTRIM DS) 800-160 MG tablet Take 1 tablet by mouth 2 (two) times daily. 10 tablet 0   Ubrogepant (UBRELVY) 100 MG TABS Take 100 mg by mouth as needed (take 1 tablet at the earlist onset of Migraine. May repeat in 2 hours. max 2 tabs in 24 hours). (Patient taking differently: Take 100 mg by mouth daily as needed (take 1 tablet at the earlist onset of Migraine. May repeat in 2 hours. max 2 tabs in 24 hours).) 16 tablet 5   UNIFINE PENTIPS 32G X 4 MM MISC USE TO INJECT INSULIN 4 TIMES DAILY 400 each 5   Vitamin D, Ergocalciferol, (DRISDOL) 1.25 MG (50000 UNIT) CAPS capsule Take 1 capsule (50,000 Units total) by mouth every 7 (seven) days. (Patient taking differently: Take 50,000 Units by mouth every 7 (seven) days. Mondays) 12 capsule 0   acetaminophen (TYLENOL) 500 MG tablet Take 1,000 mg by mouth every 6 (six) hours as needed for moderate pain. (Patient not taking: Reported on 10/25/2021)     benzonatate (TESSALON) 100 MG capsule Take 1 capsule (100 mg total) by mouth 2 (two) times daily as needed for cough. (Patient not taking: Reported on 10/25/2021) 30 capsule 0   No facility-administered medications prior to visit.     Review of Systems:   Constitutional: No weight loss or gain, night sweats, fevers, chills. +fatigue (improved with CPAP) HEENT: No headaches, difficulty swallowing, tooth/dental problems, or sore throat. No sneezing, itching, ear ache +nasal congestion, post nasal drip (chronic, stable) CV:  No chest pain, orthopnea, PND, swelling in lower extremities, anasarca, dizziness, palpitations, syncope Resp: +shortness of breath with exertion; chronic cough (unchanged; minimal). No excess mucus or change in color of mucus.No hemoptysis. No wheezing.  No chest wall  deformity GI:  No heartburn, indigestion, abdominal pain, nausea, vomiting, diarrhea, change in bowel habits, loss of appetite, bloody stools.  Skin: No rash, lesions, ulcerations MSK:  No joint pain or swelling.  No decreased range of motion.  No back pain. Neuro: No dizziness or lightheadedness.  Psych: No depression or anxiety. Mood stable.   Observations/Objective: Patient is well-developed, well-nourished in no acute distress. A&Ox3. Unlabored, regular breathing. Speech is clear and coherent with logical content.    Assessment and Plan: DOE (dyspnea on exertion) Persistent DOE since her most recent COVID infection/pneumonia in May. She has had COVID at least 6 times. CTA chest did not show any acute process or underlying lung disease. PFTs were reviewed today with no significant defect in lung function and volumes were normal. She did have a moderate diffusion defect with DLCO of 67% corrected for alveolar volume. Unclear etiology at this point. Recommended we check echocardiogram to assess for cardiac component or PAH. If exam is unremarkable, we will consider HRCT to evaluate for ILD. Recent CBC without anemia.   Patient Instructions  Continue Albuterol inhaler 2 puffs every 6 hours as needed for shortness of breath or wheezing. Notify if symptoms persist despite rescue inhaler/neb use. Continue Flonase 2 sprays each nostril twice daily Continue Pepcid 40 mg twice daily Continue Protonix 40 mg twice daily  Continue to use CPAP every night, minimum of 4-6 hours a night.  Change equipment every 30 days or as directed by DME. Wash your tubing with warm soap and water daily, hang to dry. Wash humidifier portion weekly.  Be aware of reduced alertness and do not drive or operate heavy machinery if experiencing this or drowsiness.  Exercise encouraged, as tolerated. Avoid or decrease alcohol consumption and medications that make you more sleepy, if possible. Notify if persistent daytime  sleepiness occurs even with consistent use of CPAP.  Echocardiogram ordered for further evaluation  Follow up in one month with Dr. Annamaria Boots. If symptoms do not improve or worsen, please contact office for sooner follow up or seek emergency care.     Obstructive sleep apnea syndrome Excellent compliance per her report and continues to receive good benefit. She has noticed that her fatigue is better when using CPAP and she feels more rested. Unable to get remote download and she does not have a SD card in her machine. She has yet to receive new machine from her DME; order previously sent but they needed updated note. Continue CPAP auto 5-15cmH2O. We will fax note from today to DME to see if we can get her a new machine.     I discussed the assessment and treatment plan with the patient. The patient was provided an opportunity to ask questions and all were answered. The patient agreed with the plan and demonstrated an understanding of the instructions.   The patient was advised to call back or seek an in-person evaluation if the symptoms worsen or if the condition fails to improve as anticipated.  I provided 31 minutes of non-face-to-face time during this encounter.   Clayton Bibles, NP

## 2021-10-25 NOTE — Progress Notes (Deleted)
'@Patient'  ID: Kelsey Vang, female    DOB: [birth year], 46 y.o.   MRN: [id number]  Chief Complaint  Patient presents with   Follow-up    She is here to go over PFT results on 10/15/21. She reports shortness of breath with exertion,     Referring provider: Nche, Charlene Brooke, NP  HPI:   TEST/EVENTS:   Allergies  Allergen Reactions   Metformin And Related Other (See Comments)    GI upset   Metoclopramide Other (See Comments)    headache   Gabapentin Other (See Comments)    hallucination   Keflex [Cephalexin] Rash   Levemir [Insulin Detemir] Dermatitis    Whelps at injection site   Prednisone Palpitations    oral    Immunization History  Administered Date(s) Administered   Hepatitis B, adult 05/03/2013, 05/31/2013, 08/27/2013   Hepatitis B, ped/adol 05/03/2013, 05/31/2013, 08/27/2013   Influenza Split 12/27/2013   Influenza, Seasonal, Injecte, Preservative Fre 01/15/2015   Influenza,inj,Quad PF,6+ Mos 01/15/2015, 02/04/2019, 01/04/2021   Influenza-Unspecified 12/27/2013, 01/15/2015, 12/09/2017, 01/23/2019   MMR 08/27/2013   PFIZER(Purple Top)SARS-COV-2 Vaccination 08/20/2019, 03/27/2020   PPD Test 12/27/2013, 01/15/2014   Pneumococcal Polysaccharide-23 10/07/2014   Tdap 10/07/2014   Varicella 05/03/2013, 08/27/2013    Past Medical History:  Diagnosis Date   Anxiety    Phreesia 06/09/2020   Chronic back pain    Depression    Phreesia 06/09/2020   Diabetes mellitus without complication (HCC)    Gastroparesis due to DM (HCC)    GERD (gastroesophageal reflux disease)    Phreesia 06/09/2020   Hx of migraines    Hyperlipemia    Hyperlipidemia    Phreesia 06/09/2020   Hypertension    IBS (irritable bowel syndrome)    Nonarteritic ischemic optic neuropathy, unspecified laterality    Peripheral neuropathy    Sinusitis, chronic 01/17/2019   Sleep apnea    Phreesia 06/09/2020    Tobacco History: Social History   Tobacco Use  Smoking Status Every Day    Packs/day: 1.00   Years: 20.00   Total pack years: 20.00   Types: Cigarettes  Smokeless Tobacco Never   Ready to quit: Not Answered Counseling given: Not Answered   Outpatient Medications Prior to Visit  Medication Sig Dispense Refill   albuterol (VENTOLIN HFA) 108 (90 Base) MCG/ACT inhaler Inhale 2 puffs into the lungs every 6 (six) hours as needed for wheezing or shortness of breath. 8 g 0   aspirin EC 81 MG tablet Take 81 mg by mouth daily. Swallow whole.     azithromycin (ZITHROMAX) 250 MG tablet Take 250 mg by mouth daily.     busPIRone (BUSPAR) 15 MG tablet Take 15 mg by mouth daily.     Continuous Blood Gluc Receiver (FREESTYLE LIBRE 2 READER) DEVI 1 each by Does not apply route daily. 1 each 0   Continuous Blood Gluc Sensor (FREESTYLE LIBRE 2 SENSOR) MISC 1 each by Does not apply route every 14 (fourteen) days. 6 each 3   cyclobenzaprine (FLEXERIL) 10 MG tablet Take 10 mg by mouth 2 (two) times daily as needed for muscle spasms.     diphenoxylate-atropine (LOMOTIL) 2.5-0.025 MG tablet Take 1 tablet by mouth 4 (four) times daily as needed for diarrhea or loose stools.     dronabinol (MARINOL) 2.5 MG capsule Take 2.5 mg by mouth 2 (two) times daily before a meal.     empagliflozin (JARDIANCE) 10 MG TABS tablet Take 1 tablet (10 mg total) by mouth daily  before breakfast. 90 tablet 3   Erenumab-aooe (AIMOVIG) 140 MG/ML SOAJ Inject 140 mg into the skin every 28 (twenty-eight) days. 1.12 mL 11   escitalopram (LEXAPRO) 10 MG tablet Take 10 mg by mouth at bedtime.     famotidine (PEPCID) 40 MG tablet Take 40 mg by mouth 2 (two) times daily.     fluticasone (FLONASE) 50 MCG/ACT nasal spray Place 2 sprays into both nostrils 2 (two) times daily.     glucose blood (FREESTYLE PRECISION NEO TEST) test strip Use as instructed to check blood sugar 4X daily 400 each 3   guaiFENesin (MUCINEX) 600 MG 12 hr tablet Take 600 mg by mouth 2 (two) times daily.     hydrOXYzine (ATARAX) 25 MG tablet Take  25 mg by mouth daily as needed.     hyoscyamine (LEVBID) 0.375 MG 12 hr tablet Take 0.375 mg by mouth 2 (two) times daily.     Hyprom-Naphaz-Polysorb-Zn Sulf (CLEAR EYES COMPLETE OP) Place 1 drop into both eyes daily as needed.     ibuprofen (ADVIL) 200 MG tablet Take 800 mg by mouth every 12 (twelve) hours as needed for moderate pain.     Insulin Glargine (BASAGLAR KWIKPEN) 100 UNIT/ML Inject 35-45 Units into the skin See admin instructions. Take 35 units in the morning and 45 units at night     insulin lispro (HUMALOG KWIKPEN) 200 UNIT/ML KwikPen Inject 25-35 Units into the skin 3 (three) times daily before meals. 30 mL 1   Insulin Lispro-aabc (LYUMJEV KWIKPEN) 200 UNIT/ML KwikPen Inject 25-30 Units into the skin 3 (three) times daily before meals. 15 mL 3   mupirocin cream (BACTROBAN) 2 % Apply 1 application. topically 2 (two) times daily. 15 g 0   ondansetron (ZOFRAN) 4 MG tablet Take 1 tablet (4 mg total) by mouth every 8 (eight) hours as needed for nausea or vomiting. 20 tablet 0   pantoprazole (PROTONIX) 40 MG tablet Take 40 mg by mouth 2 (two) times daily.     polycarbophil (FIBERCON) 625 MG tablet Take 1,250 mg by mouth in the morning and at bedtime.     pregabalin (LYRICA) 150 MG capsule Take 150 mg by mouth daily.     promethazine (PHENERGAN) 12.5 MG tablet Take 12.5 mg by mouth every 6 (six) hours as needed for nausea or vomiting.     QUEtiapine (SEROQUEL) 50 MG tablet Take 50 mg by mouth at bedtime.     sulfamethoxazole-trimethoprim (BACTRIM DS) 800-160 MG tablet Take 1 tablet by mouth 2 (two) times daily. 10 tablet 0   Ubrogepant (UBRELVY) 100 MG TABS Take 100 mg by mouth as needed (take 1 tablet at the earlist onset of Migraine. May repeat in 2 hours. max 2 tabs in 24 hours). (Patient taking differently: Take 100 mg by mouth daily as needed (take 1 tablet at the earlist onset of Migraine. May repeat in 2 hours. max 2 tabs in 24 hours).) 16 tablet 5   UNIFINE PENTIPS 32G X 4 MM MISC USE  TO INJECT INSULIN 4 TIMES DAILY 400 each 5   Vitamin D, Ergocalciferol, (DRISDOL) 1.25 MG (50000 UNIT) CAPS capsule Take 1 capsule (50,000 Units total) by mouth every 7 (seven) days. (Patient taking differently: Take 50,000 Units by mouth every 7 (seven) days. Mondays) 12 capsule 0   acetaminophen (TYLENOL) 500 MG tablet Take 1,000 mg by mouth every 6 (six) hours as needed for moderate pain. (Patient not taking: Reported on 10/25/2021)     benzonatate (TESSALON) 100  MG capsule Take 1 capsule (100 mg total) by mouth 2 (two) times daily as needed for cough. (Patient not taking: Reported on 10/25/2021) 30 capsule 0   No facility-administered medications prior to visit.     Review of Systems:   Constitutional: No weight loss or gain, night sweats, fevers, chills, fatigue, or lassitude. HEENT: No headaches, difficulty swallowing, tooth/dental problems, or sore throat. No sneezing, itching, ear ache, nasal congestion, or post nasal drip CV:  No chest pain, orthopnea, PND, swelling in lower extremities, anasarca, dizziness, palpitations, syncope Resp: No shortness of breath with exertion or at rest. No excess mucus or change in color of mucus. No productive or non-productive. No hemoptysis. No wheezing.  No chest wall deformity GI:  No heartburn, indigestion, abdominal pain, nausea, vomiting, diarrhea, change in bowel habits, loss of appetite, bloody stools.  GU: No dysuria, change in color of urine, urgency or frequency.  No flank pain, no hematuria  Skin: No rash, lesions, ulcerations MSK:  No joint pain or swelling.  No decreased range of motion.  No back pain. Neuro: No dizziness or lightheadedness.  Psych: No depression or anxiety. Mood stable.     Physical Exam:  There were no vitals taken for this visit.  GEN: Pleasant, interactive, well-nourished/chronically-ill appearing/acutely-ill appearing/poorly-nourished/morbidly obese; in no acute distress.****** HEENT:  Normocephalic and  atraumatic. EACs patent bilaterally. TM pearly gray with present light reflex bilaterally. PERRLA. Sclera white. Nasal turbinates pink, moist and patent bilaterally. No rhinorrhea present. Oropharynx pink and moist, without exudate or edema. No lesions, ulcerations, or postnasal drip.  NECK:  Supple w/ fair ROM. No JVD present. Normal carotid impulses w/o bruits. Thyroid symmetrical with no goiter or nodules palpated. No lymphadenopathy.   CV: RRR, no m/r/g, no peripheral edema. Pulses intact, +2 bilaterally. No cyanosis, pallor or clubbing. PULMONARY:  Unlabored, regular breathing. Clear bilaterally A&P w/o wheezes/rales/rhonchi. No accessory muscle use. No dullness to percussion. GI: BS present and normoactive. Soft, non-tender to palpation. No organomegaly or masses detected. No CVA tenderness. MSK: No erythema, warmth or tenderness. Cap refil <2 sec all extrem. No deformities or joint swelling noted.  Neuro: A/Ox3. No focal deficits noted.   Skin: Warm, no lesions or rashe Psych: Normal affect and behavior. Judgement and thought content appropriate.     Lab Results:  CBC    Component Value Date/Time   WBC 13.3 (H) 08/28/2021 1129   RBC 5.17 (H) 08/28/2021 1129   HGB 16.0 (H) 08/28/2021 1129   HGB 14.4 10/02/2018 1558   HCT 47.6 (H) 08/28/2021 1129   HCT 43.3 10/02/2018 1558   PLT 227 08/28/2021 1129   PLT 352 10/02/2018 1558   MCV 92.1 08/28/2021 1129   MCV 91 10/02/2018 1558   MCH 30.9 08/28/2021 1129   MCHC 33.6 08/28/2021 1129   RDW 14.5 08/28/2021 1129   RDW 13.6 10/02/2018 1558   LYMPHSABS 3.6 09/03/2019 1133   LYMPHSABS 3.4 (H) 03/04/2017 1259   MONOABS 0.6 09/03/2019 1133   EOSABS 0.2 09/03/2019 1133   EOSABS 0.9 (H) 03/04/2017 1259   BASOSABS 0.1 09/03/2019 1133   BASOSABS 0.0 03/04/2017 1259    BMET    Component Value Date/Time   NA 135 08/28/2021 1207   NA 136 10/02/2018 1558   K 4.1 08/28/2021 1207   CL 102 08/28/2021 1207   CO2 22 08/28/2021 1207    GLUCOSE 344 (H) 08/28/2021 1207   BUN 10 08/28/2021 1207   BUN 9 10/02/2018 1558   CREATININE 0.84  08/28/2021 1207   CREATININE 0.69 06/11/2021 1451   CALCIUM 8.7 (L) 08/28/2021 1207   GFRNONAA >60 08/28/2021 1207   GFRAA >60 09/03/2019 1133    BNP    Component Value Date/Time   BNP 13.3 09/03/2019 1133     Imaging:  No results found.       Latest Ref Rng & Units 10/15/2021    3:14 PM  PFT Results  FVC-Pre L 2.67   FVC-Predicted Pre % 78   FVC-Post L 2.63   FVC-Predicted Post % 77   Pre FEV1/FVC % % 86   Post FEV1/FCV % % 87   FEV1-Pre L 2.29   FEV1-Predicted Pre % 83   FEV1-Post L 2.28   DLCO uncorrected ml/min/mmHg 14.52   DLCO UNC% % 72   DLCO corrected ml/min/mmHg 13.55   DLCO COR %Predicted % 67   DLVA Predicted % 82   TLC L 4.31   TLC % Predicted % 90   RV % Predicted % 97     No results found for: "NITRICOXIDE"      Assessment & Plan:   No problem-specific Assessment & Plan notes found for this encounter.   I spent *** minutes of dedicated to the care of this patient on the date of this encounter to include pre-visit review of records, face-to-face time with the patient discussing conditions above, post visit ordering of testing, clinical documentation with the electronic health record, making appropriate referrals as documented, and communicating necessary findings to members of the patients care team.  Clayton Bibles, NP 10/25/2021  Pt aware and understands NP's role.

## 2021-10-26 NOTE — Telephone Encounter (Signed)
Submitted benefit investigation through [employer]: [id number].  Medication exclusion through pharmacy benefit, PA Team will submit fax form to [facility] for medical benefit PA

## 2021-10-27 ENCOUNTER — Other Ambulatory Visit (HOSPITAL_COMMUNITY): Payer: Self-pay

## 2021-10-28 NOTE — Telephone Encounter (Signed)
Medical benefit form faxed to [facility]. Awaiting response.

## 2021-10-29 ENCOUNTER — Encounter: Payer: Commercial Managed Care - HMO | Admitting: Nurse Practitioner

## 2021-10-30 ENCOUNTER — Telehealth (HOSPITAL_COMMUNITY): Payer: Self-pay | Admitting: Psychiatry

## 2021-10-30 ENCOUNTER — Encounter (HOSPITAL_COMMUNITY): Payer: Self-pay | Admitting: Psychiatry

## 2021-10-30 ENCOUNTER — Ambulatory Visit (HOSPITAL_COMMUNITY): Payer: Commercial Managed Care - HMO | Admitting: Psychiatry

## 2021-10-30 NOTE — Progress Notes (Unsigned)
Virtual Visit via Video Note  I connected with [name] on 10/30/21 at 11:00 AM EDT by a video enabled telemedicine application and verified that I am speaking with the correct person using two identifiers. Tierra and I kept getting disconnected during our video visit. At times I was not able to hear her or see her. We attempted to reconnect several times but the same problem kept happening. After a while I was receiving messages that the video was disconnected due to low bandwidth.  We were able to review her allergies and current meds and part of her medical illnesses. Since we were getting disconnected so often and not able to hear each other I ended the visit. Dore will be rescheduled and was understanding of the situation.     Location: Patient: home  Provider: office   The duration of this appointment visit was 30 minutes of non face-to-face time with the patient.     Oletta Darter, MD 7/15/202311:02 AM

## 2021-10-30 NOTE — Telephone Encounter (Signed)
  I connected with [name] on 10/30/21 at 11:00 AM EDT by a video enabled telemedicine application and verified that I am speaking with the correct person using two identifiers. Kelsey Vang and I kept getting disconnected during our video visit. At times I was not able to hear her or see her. We attempted to reconnect several times but the same problem kept happening. After a while I was receiving messages that the video was disconnected due to low bandwidth.  We were able to review her allergies and current meds and part of her medical illnesses. Since we were getting disconnected so often and not able to hear each other I ended the visit. Kelsey Vang will be rescheduled and was understanding of the situation.

## 2021-11-02 MED ORDER — BENZONATATE 200 MG PO CAPS: 200.0000 mg | ORAL_CAPSULE | Freq: Three times a day (TID) | ORAL | 2 refills | Status: DC | PRN

## 2021-11-02 NOTE — Telephone Encounter (Signed)
We are unable to get her a replacement CPAP machine until she updates her sleep study. If she agrees, please schedule home sleep test   dx OSA She will need to call for results about 2 weeks after her tet.

## 2021-11-02 NOTE — Telephone Encounter (Signed)
Spoke with the pt  She was agreeable to HST and this was ordered  She will call 2 wks after for results  She states still has dry cough after recent video visit with Florentina Addison  She is taking mucinex and all meds prescribed  She states tesslaon pearles have helped the most in the past  She asks that we send rx for this to the [facility] in [place]  Please advise if this is okay, thanks!  Allergies  Allergen Reactions   Metformin And Related Other (See Comments)    GI upset   Metoclopramide Other (See Comments)    headache   Gabapentin Other (See Comments)    hallucination   Keflex [Cephalexin] Rash   Levemir [Insulin Detemir] Dermatitis    Whelps at injection site   Prednisone Palpitations    oral

## 2021-11-02 NOTE — Telephone Encounter (Signed)
Ok to Rx Tessalon 200 mg, # 50, ref x 2,    1 every 8 hours if needed for cough

## 2021-11-02 NOTE — Telephone Encounter (Signed)
Spoke with the pt and notified of response per Dr Maple Hudson. I have sent rx for tessalon.

## 2021-11-03 ENCOUNTER — Telehealth (HOSPITAL_COMMUNITY): Payer: Self-pay | Admitting: Pharmacy Technician

## 2021-11-03 ENCOUNTER — Other Ambulatory Visit (HOSPITAL_COMMUNITY): Payer: Self-pay

## 2021-11-03 NOTE — Telephone Encounter (Signed)
Patient Advocate Encounter  Prior Authorization for Aimovig 140MG /ML auto-injectors has been approved.    PA# Effective dates: 11/03/2021 through 11/03/2022      11/05/2022, CPhT Pharmacy Patient Advocate Specialist [facility] Patient Advocate Team Direct Number: [phone]  Fax: [phone]

## 2021-11-03 NOTE — Telephone Encounter (Signed)
Patient Advocate Encounter   Received notification that prior authorization for Aimovig 140MG /ML auto-injectors is required.   PA submitted on 11/03/2021 Key 11/05/2021 Status is pending       [id number], CPhT Pharmacy Patient Advocate Specialist [facility] Patient Advocate Team Direct Number: [phone]  Fax: [phone]

## 2021-11-07 ENCOUNTER — Encounter: Payer: Self-pay | Admitting: Internal Medicine

## 2021-11-07 DIAGNOSIS — E1142 Type 2 diabetes mellitus with diabetic polyneuropathy: Secondary | ICD-10-CM

## 2021-11-08 ENCOUNTER — Other Ambulatory Visit: Payer: Self-pay | Admitting: Nurse Practitioner

## 2021-11-08 MED ORDER — FREESTYLE LIBRE 2 READER DEVI: 1.0000 | Freq: Every day | 0 refills | Status: DC

## 2021-11-08 MED ORDER — FREESTYLE LIBRE 2 SENSOR MISC: 1.0000 | 3 refills | Status: DC

## 2021-11-12 ENCOUNTER — Ambulatory Visit (HOSPITAL_COMMUNITY): Payer: Commercial Managed Care - HMO

## 2021-11-12 ENCOUNTER — Telehealth (HOSPITAL_COMMUNITY): Payer: Self-pay | Admitting: Nurse Practitioner

## 2021-11-12 ENCOUNTER — Other Ambulatory Visit: Payer: Self-pay

## 2021-11-12 ENCOUNTER — Encounter (HOSPITAL_BASED_OUTPATIENT_CLINIC_OR_DEPARTMENT_OTHER): Payer: Self-pay

## 2021-11-12 ENCOUNTER — Emergency Department (HOSPITAL_BASED_OUTPATIENT_CLINIC_OR_DEPARTMENT_OTHER)
Admission: EM | Admit: 2021-11-12 | Discharge: 2021-11-12 | Disposition: A | Discharge status: Home / Self Care | Payer: Commercial Managed Care - HMO | Attending: Emergency Medicine | Admitting: Emergency Medicine

## 2021-11-12 DIAGNOSIS — E119 Type 2 diabetes mellitus without complications: Secondary | ICD-10-CM | POA: Diagnosis not present

## 2021-11-12 DIAGNOSIS — L0291 Cutaneous abscess, unspecified: Secondary | ICD-10-CM

## 2021-11-12 DIAGNOSIS — Z7982 Long term (current) use of aspirin: Secondary | ICD-10-CM | POA: Diagnosis not present

## 2021-11-12 DIAGNOSIS — Z8616 Personal history of COVID-19: Secondary | ICD-10-CM | POA: Diagnosis not present

## 2021-11-12 DIAGNOSIS — Z794 Long term (current) use of insulin: Secondary | ICD-10-CM | POA: Diagnosis not present

## 2021-11-12 DIAGNOSIS — L02415 Cutaneous abscess of right lower limb: Secondary | ICD-10-CM | POA: Insufficient documentation

## 2021-11-12 DIAGNOSIS — Z7984 Long term (current) use of oral hypoglycemic drugs: Secondary | ICD-10-CM | POA: Insufficient documentation

## 2021-11-12 MED ORDER — DOXYCYCLINE HYCLATE 100 MG PO CAPS: 100.0000 mg | ORAL_CAPSULE | Freq: Two times a day (BID) | ORAL | 0 refills | Status: DC

## 2021-11-12 MED ORDER — LIDOCAINE HCL (PF) 1 % IJ SOLN
5.0000 mL | Freq: Once | INTRAMUSCULAR | Status: AC
  Administered 2021-11-12: 5 mL via INTRADERMAL
  Filled 2021-11-12: qty 5

## 2021-11-12 MED ORDER — DOXYCYCLINE HYCLATE 100 MG PO TABS
100.0000 mg | ORAL_TABLET | Freq: Once | ORAL | Status: AC
Start: 2021-11-12 — End: 2021-11-12
  Administered 2021-11-12: 100 mg via ORAL
  Filled 2021-11-12: qty 1

## 2021-11-12 NOTE — ED Provider Notes (Signed)
[facility] EMERGENCY DEPARTMENT Provider Note   CSN: [id number] Arrival date & time: 11/12/21  0538     History  Chief Complaint  Patient presents with   Abscess    Kelsey Vang is a 46 y.o. female.  Patient is a 46 year old female with past medical history of diabetes, obstructive sleep apnea, hyperlipidemia, chronic renal insufficiency, and recurrent COVID-19 infections.  She presents today with complaints of pain and swelling to the inside of her right thigh.  This has been worsening over the past 5 days.  She has had abscesses in the past that have presented in a similar fashion.  She denies fevers or chills.  She does describe some shortness of breath, but states that this is her baseline.  The history is provided by the patient.       Home Medications Prior to Admission medications   Medication Sig Start Date End Date Taking? Authorizing Provider  albuterol (VENTOLIN HFA) 108 (90 Base) MCG/ACT inhaler INHALE 2 PUFFS BY MOUTH EVERY 6 HOURS AS NEEDED FOR WHEEZING FOR SHORTNESS OF BREATH 11/09/21   Nche, Bonna Gains, NP  aspirin EC 81 MG tablet Take 81 mg by mouth daily. Swallow whole.    [provider]  azithromycin (ZITHROMAX) 250 MG tablet Take 250 mg by mouth daily.    [provider]  benzonatate (TESSALON) 200 MG capsule Take 1 capsule (200 mg total) by mouth every 8 (eight) hours as needed for cough. 11/02/21   Waymon Budge, MD  Continuous Blood Gluc Receiver (FREESTYLE LIBRE 2 READER) DEVI 1 each by Does not apply route daily. 11/08/21   Carlus Pavlov, MD  Continuous Blood Gluc Sensor (FREESTYLE LIBRE 2 SENSOR) MISC 1 each by Does not apply route every 14 (fourteen) days. 11/08/21   Carlus Pavlov, MD  cyclobenzaprine (FLEXERIL) 10 MG tablet Take 10 mg by mouth 2 (two) times daily as needed for muscle spasms. Patient not taking: Reported on 10/30/2021 06/12/20   [provider]  diphenoxylate-atropine (LOMOTIL) 2.5-0.025  MG tablet Take 1 tablet by mouth 4 (four) times daily as needed for diarrhea or loose stools.    [provider]  dronabinol (MARINOL) 2.5 MG capsule Take 2.5 mg by mouth 2 (two) times daily before a meal.    [provider]  empagliflozin (JARDIANCE) 10 MG TABS tablet Take 1 tablet (10 mg total) by mouth daily before breakfast. 07/06/21   Carlus Pavlov, MD  Erenumab-aooe (AIMOVIG) 140 MG/ML SOAJ Inject 140 mg into the skin every 28 (twenty-eight) days. 05/28/21   Everlena Cooper, Adam R, DO  escitalopram (LEXAPRO) 10 MG tablet Take 10 mg by mouth at bedtime.    [provider]  famotidine (PEPCID) 40 MG tablet Take 40 mg by mouth 2 (two) times daily.    [provider]  fluticasone (FLONASE) 50 MCG/ACT nasal spray Place 2 sprays into both nostrils 2 (two) times daily. 07/18/21   [provider]  glucose blood (FREESTYLE PRECISION NEO TEST) test strip Use as instructed to check blood sugar 4X daily 06/10/21   Carlus Pavlov, MD  guaiFENesin (MUCINEX) 600 MG 12 hr tablet Take 600 mg by mouth 2 (two) times daily.    [provider]  hydrOXYzine (ATARAX) 25 MG tablet Take 25 mg by mouth daily as needed. 05/27/21   [provider]  hyoscyamine (LEVBID) 0.375 MG 12 hr tablet Take 0.375 mg by mouth 2 (two) times daily.    [provider]  Hyprom-Naphaz-Polysorb-Zn Sulf (CLEAR  EYES COMPLETE OP) Place 1 drop into both eyes daily as needed.    [provider]  ibuprofen (ADVIL) 200 MG tablet Take 800 mg by mouth every 12 (twelve) hours as needed for moderate pain.    [provider]  Insulin Glargine (BASAGLAR KWIKPEN) 100 UNIT/ML Inject 35-45 Units into the skin See admin instructions. Take 35 units in the morning and 45 units at night 08/12/21   [provider]  insulin lispro (HUMALOG KWIKPEN) 200 UNIT/ML KwikPen Inject 25-35 Units into the skin 3 (three) times daily before meals. 09/24/21   Carlus Pavlov, MD  Insulin  Lispro-aabc (LYUMJEV KWIKPEN) 200 UNIT/ML KwikPen Inject 25-30 Units into the skin 3 (three) times daily before meals. 07/21/21   Carlus Pavlov, MD  mupirocin cream (BACTROBAN) 2 % Apply 1 application. topically 2 (two) times daily. 08/06/21   McElwee, Lauren A, NP  ondansetron (ZOFRAN) 4 MG tablet Take 1 tablet (4 mg total) by mouth every 8 (eight) hours as needed for nausea or vomiting. Patient not taking: Reported on 10/30/2021 01/18/19   Royal Hawthorn, NP  pantoprazole (PROTONIX) 40 MG tablet Take 40 mg by mouth 2 (two) times daily. 12/17/20   [provider]  polycarbophil (FIBERCON) 625 MG tablet Take 1,250 mg by mouth in the morning and at bedtime.    [provider]  pregabalin (LYRICA) 150 MG capsule Take 150 mg by mouth daily. 07/27/20   [provider]  promethazine (PHENERGAN) 12.5 MG tablet Take 12.5 mg by mouth every 6 (six) hours as needed for nausea or vomiting. Patient not taking: Reported on 10/30/2021    [provider]  QUEtiapine (SEROQUEL) 50 MG tablet Take 50 mg by mouth at bedtime. 08/18/21   [provider]  sulfamethoxazole-trimethoprim (BACTRIM DS) 800-160 MG tablet Take 1 tablet by mouth 2 (two) times daily. Patient not taking: Reported on 10/30/2021 10/18/21   Margaretann Loveless, PA-C  Ubrogepant (UBRELVY) 100 MG TABS Take 100 mg by mouth as needed (take 1 tablet at the earlist onset of Migraine. May repeat in 2 hours. max 2 tabs in 24 hours). Patient taking differently: Take 100 mg by mouth daily as needed (take 1 tablet at the earlist onset of Migraine. May repeat in 2 hours. max 2 tabs in 24 hours). 06/03/21   Jaffe, Adam R, DO  UNIFINE PENTIPS 32G X 4 MM MISC USE TO INJECT INSULIN 4 TIMES DAILY 02/02/18   Carlus Pavlov, MD  Vitamin D, Ergocalciferol, (DRISDOL) 1.25 MG (50000 UNIT) CAPS capsule Take 1 capsule (50,000 Units total) by mouth every 7 (seven) days. Patient taking differently: Take 50,000 Units by mouth every 7  (seven) days. Mondays 06/13/21   Nche, Bonna Gains, NP      Allergies    Metformin and related, Metoclopramide, Gabapentin, Keflex [cephalexin], Levemir [insulin detemir], and Prednisone    Review of Systems   Review of Systems  All other systems reviewed and are negative.   Physical Exam Updated Vital Signs BP 129/73 (BP Location: Right Arm)   Pulse (!) 102   Temp 98.3 F (36.8 C) (Oral)   Resp 18   Ht 5\' 1"  (1.549 m)   Wt 121.1 kg   SpO2 97%   BMI 50.45 kg/m  Physical Exam Vitals and nursing note reviewed.  Constitutional:      Appearance: Normal appearance.  HENT:     Head: Normocephalic and atraumatic.  Pulmonary:     Effort: Pulmonary effort is normal.  Skin:  General: Skin is warm and dry.     Comments: To the medial aspect of the right upper inner thigh, there is an area of swelling, tenderness and fluctuance measuring approximately 3 cm x 6 cm.  There is some overlying erythema.  Neurological:     Mental Status: She is alert and oriented to person, place, and time.     ED Results / Procedures / Treatments   Labs (all labs ordered are listed, but only abnormal results are displayed) Labs Reviewed - No data to display  EKG None  Radiology No results found.  Procedures Procedures    Medications Ordered in ED Medications  doxycycline (VIBRA-TABS) tablet 100 mg (has no administration in time range)  lidocaine (PF) (XYLOCAINE) 1 % injection 5 mL (5 mLs Intradermal Given 11/12/21 0601)    ED Course/ Medical Decision Making/ A&P  The abscessed area was incised and drained of a bloody/purulent material.  Patient to be discharged with doxycycline, warm compresses, and follow-up as needed.  She tells me she did a COVID test at home that was positive and tells me this is her seventh time testing positive over the past 2-1/2 years.  Final Clinical Impression(s) / ED Diagnoses Final diagnoses:  None    Rx / DC Orders ED Discharge Orders     None          Geoffery Lyons, MD 11/12/21 [phone]

## 2021-11-12 NOTE — Telephone Encounter (Signed)
LMOVM for patient, PA still pending as 11/09/21.

## 2021-11-12 NOTE — ED Triage Notes (Signed)
Pt c/o abscess to inner right thigh for about 4 days and shortness of breath. Pt reports she took a home COVID test yesterday that was positive.

## 2021-11-12 NOTE — ED Notes (Signed)
Dressing applied with guaze & tape 

## 2021-11-12 NOTE — Telephone Encounter (Signed)
Patient called and cancelled echocardiogram for the reason below:  11/11/2021 3:47 PM HE:NIDPOE, Kelsey Vang  Cancel Rsn: Conflict/Need to Reschedule (Pt stated she tested positive for COVID. She will call back to reschedule.)  Order will be removed from the active echo wq. When patient calls back we will reinstate the order. Thank you

## 2021-11-12 NOTE — Discharge Instructions (Addendum)
Begin taking doxycycline as prescribed. ° °Apply warm compresses as frequently as possible for the next several days. ° °Return to the emergency department if symptoms significantly worsen or change. °

## 2021-11-16 ENCOUNTER — Encounter: Payer: Self-pay | Admitting: Neurology

## 2021-11-16 MED ORDER — BOTOX 200 UNITS IJ SOLR: 200.0000 [IU] | INTRAMUSCULAR | 4 refills | Status: DC

## 2021-11-16 NOTE — Telephone Encounter (Signed)
Letter received from [place], Botox approved 10/27/21-10/26/22 Pharmacy [facility],  Script sent to [facility]. Patient notified. Appt scheduled. Fax letter of approval to [facility] as well.

## 2021-11-18 ENCOUNTER — Ambulatory Visit (HOSPITAL_COMMUNITY): Payer: Commercial Managed Care - HMO | Admitting: Psychiatry

## 2021-11-24 ENCOUNTER — Encounter: Payer: Self-pay | Admitting: Neurology

## 2021-11-25 NOTE — Assessment & Plan Note (Addendum)
-   Patient has symptoms of dyspnea on exertion along with chest tightness. CTA on 08/28/21 negative for PE. No acute airspace disease. Recommend checking pulmonary function testing due to history of COVID and smoking. Starting BREO one puff daily x 2 weeks.

## 2021-11-25 NOTE — Assessment & Plan Note (Signed)
-   Poor compliance d.t issues with mask fit. Ordered for new CPAP machine back in March, pressure changed to auto 5-15cm h20 and needs mask fitting

## 2021-11-26 ENCOUNTER — Ambulatory Visit (HOSPITAL_COMMUNITY): Payer: Commercial Managed Care - HMO | Attending: Nurse Practitioner

## 2021-11-26 DIAGNOSIS — F17200 Nicotine dependence, unspecified, uncomplicated: Secondary | ICD-10-CM | POA: Insufficient documentation

## 2021-11-26 DIAGNOSIS — R0602 Shortness of breath: Secondary | ICD-10-CM | POA: Diagnosis present

## 2021-11-26 DIAGNOSIS — E785 Hyperlipidemia, unspecified: Secondary | ICD-10-CM | POA: Insufficient documentation

## 2021-11-26 DIAGNOSIS — E119 Type 2 diabetes mellitus without complications: Secondary | ICD-10-CM | POA: Diagnosis not present

## 2021-11-26 LAB — ECHOCARDIOGRAM COMPLETE
Area-P 1/2: 2.97 cm2
S' Lateral: 3.1 cm

## 2021-11-30 NOTE — Telephone Encounter (Signed)
Error

## 2021-12-01 ENCOUNTER — Telehealth: Payer: Self-pay | Admitting: Internal Medicine

## 2021-12-01 ENCOUNTER — Other Ambulatory Visit: Payer: Self-pay | Admitting: Nurse Practitioner

## 2021-12-01 DIAGNOSIS — R0609 Other forms of dyspnea: Secondary | ICD-10-CM

## 2021-12-01 NOTE — Telephone Encounter (Signed)
Called and spoke with patient about recommendations from [place] and what we were planning on doing moving forward. Patient verbalized understanding. Nothing further needed at this time

## 2021-12-01 NOTE — Progress Notes (Signed)
Please notify patient that her echocardiogram was unremarkable with normal pumping function. Thanks!

## 2021-12-01 NOTE — Telephone Encounter (Signed)
Called patient and let her know the results of her echocardiogram. She states still this test came back normal what are the next steps for her SOB.   Please advise Florentina Addison

## 2021-12-01 NOTE — Telephone Encounter (Signed)
I was reviewing her chart and it appears she had labs drawn at outside facility. Her eosinophils are significantly elevated (900) on CBC from June. This can be seen with asthma or certain interstitial lung diseases. I am going to order high resolution CT chest for further evaluation. We may want to try ICS/LABA inhaler for maintenance if we are leaning towards asthma diagnosis but would wait for CT results and then she can discuss at her appointment with Dr. Maple Hudson on the 25th.

## 2021-12-06 ENCOUNTER — Other Ambulatory Visit: Payer: Commercial Managed Care - HMO

## 2021-12-08 ENCOUNTER — Other Ambulatory Visit: Payer: Commercial Managed Care - HMO

## 2021-12-08 ENCOUNTER — Ambulatory Visit (HOSPITAL_COMMUNITY): Payer: Commercial Managed Care - HMO

## 2021-12-10 ENCOUNTER — Ambulatory Visit (INDEPENDENT_AMBULATORY_CARE_PROVIDER_SITE_OTHER): Payer: Commercial Managed Care - HMO | Admitting: Neurology

## 2021-12-10 ENCOUNTER — Ambulatory Visit: Payer: Commercial Managed Care - HMO | Admitting: Internal Medicine

## 2021-12-10 DIAGNOSIS — G43709 Chronic migraine without aura, not intractable, without status migrainosus: Secondary | ICD-10-CM

## 2021-12-10 MED ORDER — ONABOTULINUMTOXINA 100 UNITS IJ SOLR
200.0000 [IU] | Freq: Once | INTRAMUSCULAR | Status: AC
  Administered 2021-12-10: 155 [IU] via INTRAMUSCULAR

## 2021-12-10 NOTE — Progress Notes (Signed)
[facility]  ° °Procedure Note Botox ° °Attending: Dr. Mrk Buzby ° °Preoperative Diagnosis(es): Chronic migraine ° °Consent obtained from: The patient °Benefits discussed included, but were not limited to decreased muscle tightness, increased joint range of motion, and decreased pain.  Risk discussed included, but were not limited pain and discomfort, bleeding, bruising, excessive weakness, venous thrombosis, muscle atrophy and dysphagia.  Anticipated outcomes of the procedure as well as he risks and benefits of the alternatives to the procedure, and the roles and tasks of the personnel to be involved, were discussed with the patient, and the patient consents to the procedure and agrees to proceed. A copy of the patient medication guide was given to the patient which explains the blackbox warning. ° °Patients identity and treatment sites confirmed Yes.  . ° °Details of Procedure: °Skin was cleaned with alcohol. Prior to injection, the needle plunger was aspirated to make sure the needle was not within a blood vessel.  There was no blood retrieved on aspiration.   ° °Following is a summary of the muscles injected  And the amount of Botulinum toxin used: ° °Dilution °200 units of Botox was reconstituted with 4 ml of preservative free normal saline. °Time of reconstitution: At the time of the office visit (<30 minutes prior to injection)  ° °Injections  °155 total units of Botox was injected with a 30 gauge needle. ° °Injection Sites: °L occipitalis: 15 units- 3 sites  °R occiptalis: 15 units- 3 sites ° °L upper trapezius: 15 units- 3 sites °R upper trapezius: 15 units- 3 sits          °L paraspinal: 10 units- 2 sites °R paraspinal: 10 units- 2 sites ° °Face °L frontalis(2 injection sites):10 units   °R frontalis(2 injection sites):10 units         °L corrugator: 5 units   °R corrugator: 5 units           °Procerus: 5 units   °L temporalis: 20 units °R temporalis: 20 units  ° °Agent:  °200 units of botulinum Type  A (Onobotulinum Toxin type A) was reconstituted with 4 ml of preservative free normal saline.  °Time of reconstitution: At the time of the office visit (<30 minutes prior to injection)  ° ° ° Total injected (Units):  155 ° Total wasted (Units):  45 ° °Patient tolerated procedure well without complications.   °Reinjection is anticipated in 3 months. ° ° °

## 2021-12-14 ENCOUNTER — Ambulatory Visit (HOSPITAL_BASED_OUTPATIENT_CLINIC_OR_DEPARTMENT_OTHER): Payer: Commercial Managed Care - HMO | Admitting: Internal Medicine

## 2021-12-31 ENCOUNTER — Ambulatory Visit
Admission: RE | Admit: 2021-12-31 | Discharge: 2021-12-31 | Disposition: A | Discharge status: Home / Self Care | Payer: Commercial Managed Care - HMO | Source: Ambulatory Visit | Attending: Nurse Practitioner | Admitting: Nurse Practitioner

## 2021-12-31 DIAGNOSIS — R0609 Other forms of dyspnea: Secondary | ICD-10-CM

## 2022-01-03 NOTE — Progress Notes (Signed)
Please notify patient that her CT chest did not show any evidence of ILD. She did have a mosaic appearance to certain areas of her lungs as well as air trapping, which can be seen with asthma or small airways disease. Given this and her significantly elevated eosinophils in the past, I would recommend a re-trial of ICS/LABA if she's open to it. She has an appt with Dr. Annamaria Boots on 9/22 so she can discuss this further and determine next steps. Thanks!

## 2022-01-06 ENCOUNTER — Ambulatory Visit: Payer: Commercial Managed Care - HMO

## 2022-01-06 DIAGNOSIS — G4733 Obstructive sleep apnea (adult) (pediatric): Secondary | ICD-10-CM

## 2022-01-06 NOTE — Progress Notes (Unsigned)
07/09/21- 45 yoF Smoker for sleep evaluation courtesy of Dr Everlena Cooper with concern of OSA. Medical problem list includes Migraine, HTN, OSA,Tobacco Use,  Chronic sinusitis, DM2, Gastroparesis, GERD, IBS, Optic Neuropathy, Peripheral Neuropathy, CKD, Morbid Obesity, Hyperlipidemia,  Epworth score-16 Body weight today-263 lbs Covid vax-3 Phizer Flu vax-had -----Patient currently wears cpap and did sleep study at [facility] in [facility]. 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 She has a lot of trouble finding the right mask.  Original study was done at [facility].  Machine is set on 9. Has slept better with CPAP when she could get a mask to fit. Denies ENT surgery but has been told that she has nasal septal deviation and chronic sinusitis.  Denies heart or lung disease although she smokes every day and has had retinal vascular events.Kelsey Vang has OSA with CPAP.  01/07/22- 45 yoF Smoker followed for OSA, complicated by Migraine, HTN,Tobacco Use,  Chronic sinusitis, DM2, Gastroparesis, GERD, IBS, Optic Neuropathy, Peripheral Neuropathy, CKD, Morbid Obesity, Hyperlipidemia, Multiple Covid Infections/ Pneumonia,  LOV virtual Cobb, NP 10/25/21 NPSG 01/27/15-[facility]- AHI 7.2/ hr, desaturation to 74%, body weight  HST 01/06/22- AHI 8.1/ hr, desaturation to 83%, body weight 269 lbs CPAP auto 5-15/ [facility]  Old machine replacement ordered 07/09/21 Download compliance-  Body weight today- 274 lbs Covid vax-3 Phizer Flu vax-today standard PFT flow and volume WNL with insig resp to BD Latest Covid infection in April. Breo no help. Albuterol hfa may help a little. Asks help stopping smoking. Has tried several approaches. Willing to try Wellbutrin. Willing to try [facility], but recognize reversible bronchospasm may not be present.  ROS-see HPI   + = positive Constitutional:    weight loss, night sweats, fevers,  chills, fatigue, lassitude. HEENT:    headaches, difficulty swallowing, tooth/dental problems, sore throat,       sneezing, itching, ear ache, nasal congestion, post nasal drip, snoring CV:    chest pain, orthopnea, PND, swelling in lower extremities, anasarca,                                   dizziness, palpitations Resp:   shortness of breath with exertion or at rest.                productive cough,   non-productive cough, coughing up of blood.              change in color of mucus.  wheezing.   Skin:    rash or lesions. GI:  No-   heartburn, indigestion, abdominal pain, nausea, vomiting, diarrhea,                 change in bowel habits, loss of appetite GU: dysuria, change in color of urine, no urgency or frequency.   flank pain. MS:   joint pain, stiffness, decreased range of motion, back pain. Neuro-     nothing unusual Psych:  change in mood or affect.  depression or anxiety.   memory loss.  OBJ- Physical Exam General- Alert, Oriented, Affect-appropriate, Distress- none acute, morbidly obese Skin- rash-none, lesions- none, excoriation- none Lymphadenopathy- none Head- atraumatic            Eyes- Gross vision intact, PERRLA, conjunctivae and secretions clear            Ears- Hearing,  canals-normal            Nose- Clear, +Septal dev, no-mucus, polyps, erosion, perforation             Throat- Mallampati IV , mucosa clear , drainage- none, tonsils- atrophic, +teeth Neck- flexible , trachea midline, no stridor , thyroid nl, carotid no bruit Chest - symmetrical excursion , unlabored           Heart/CV- RRR , no murmur , no gallop  , no rub, nl s1 s2                           - JVD- none , edema- none, stasis changes- none, varices- none           Lung- clear to P&A, wheeze- none, cough- none , dullness-none, rub- none           Chest wall-  Abd-  Br/ Gen/ Rectal- Not done, not indicated Extrem- cyanosis- none, clubbing, none, atrophy- none, strength- nl Neuro- grossly intact to  observation

## 2022-01-07 ENCOUNTER — Ambulatory Visit (INDEPENDENT_AMBULATORY_CARE_PROVIDER_SITE_OTHER): Payer: Commercial Managed Care - HMO | Admitting: Internal Medicine

## 2022-01-07 ENCOUNTER — Encounter: Payer: Self-pay | Admitting: Internal Medicine

## 2022-01-07 VITALS — BP 96/64 | HR 94 | Temp 97.7°F | Ht 61.0 in | Wt 274.4 lb

## 2022-01-07 DIAGNOSIS — Z23 Encounter for immunization: Secondary | ICD-10-CM

## 2022-01-07 DIAGNOSIS — G4733 Obstructive sleep apnea (adult) (pediatric): Secondary | ICD-10-CM

## 2022-01-07 DIAGNOSIS — Z720 Tobacco use: Secondary | ICD-10-CM | POA: Diagnosis not present

## 2022-01-07 DIAGNOSIS — R0609 Other forms of dyspnea: Secondary | ICD-10-CM | POA: Diagnosis not present

## 2022-01-07 MED ORDER — BREZTRI AEROSPHERE 160-9-4.8 MCG/ACT IN AERO: 2.0000 | INHALATION_SPRAY | Freq: Two times a day (BID) | RESPIRATORY_TRACT | 0 refills | Status: DC

## 2022-01-07 MED ORDER — BUPROPION HCL ER (SR) 150 MG PO TB12
150.0000 mg | ORAL_TABLET | Freq: Two times a day (BID) | ORAL | 3 refills | Status: DC
Start: 2022-01-07 — End: 2022-05-17

## 2022-01-07 NOTE — Patient Instructions (Addendum)
Order- flu vax standard  Order- DME [facility]- please add AirView/ card. Please change CPAP auto range to 4-15  Script sent for buproprion/ Wellbutrin    try 1 twice daily to help stop smoking  Try to get out and walk more  Order- sample x 2 Breztri inhaler     inhale 2 puffs, then rinse mouth, twice daily

## 2022-01-08 NOTE — Assessment & Plan Note (Signed)
Very mild. Weight loss and sleep off back encouraged. Plan- continue CPAP 5-15. Add download

## 2022-01-08 NOTE — Assessment & Plan Note (Signed)
Mainly obesity and deconditioning Plan- smoking cessation, weight loss, increase walking, sample Breztri for trial

## 2022-01-08 NOTE — Assessment & Plan Note (Signed)
Wellbutrin added

## 2022-01-21 ENCOUNTER — Other Ambulatory Visit (HOSPITAL_COMMUNITY)
Admission: RE | Admit: 2022-01-21 | Discharge: 2022-01-21 | Disposition: A | Discharge status: Home / Self Care | Payer: Commercial Managed Care - HMO | Source: Ambulatory Visit | Attending: Nurse Practitioner | Admitting: Nurse Practitioner

## 2022-01-21 ENCOUNTER — Ambulatory Visit (INDEPENDENT_AMBULATORY_CARE_PROVIDER_SITE_OTHER): Payer: Commercial Managed Care - HMO | Admitting: Nurse Practitioner

## 2022-01-21 ENCOUNTER — Encounter: Payer: Self-pay | Admitting: Nurse Practitioner

## 2022-01-21 VITALS — BP 92/78 | HR 96 | Temp 97.3°F | Ht 61.0 in | Wt 271.0 lb

## 2022-01-21 DIAGNOSIS — E1143 Type 2 diabetes mellitus with diabetic autonomic (poly)neuropathy: Secondary | ICD-10-CM

## 2022-01-21 DIAGNOSIS — Z794 Long term (current) use of insulin: Secondary | ICD-10-CM | POA: Diagnosis not present

## 2022-01-21 DIAGNOSIS — E559 Vitamin D deficiency, unspecified: Secondary | ICD-10-CM | POA: Diagnosis not present

## 2022-01-21 DIAGNOSIS — E1169 Type 2 diabetes mellitus with other specified complication: Secondary | ICD-10-CM | POA: Diagnosis not present

## 2022-01-21 DIAGNOSIS — Z0001 Encounter for general adult medical examination with abnormal findings: Secondary | ICD-10-CM | POA: Diagnosis present

## 2022-01-21 DIAGNOSIS — H469 Unspecified optic neuritis: Secondary | ICD-10-CM

## 2022-01-21 DIAGNOSIS — E1142 Type 2 diabetes mellitus with diabetic polyneuropathy: Secondary | ICD-10-CM | POA: Diagnosis not present

## 2022-01-21 DIAGNOSIS — L739 Follicular disorder, unspecified: Secondary | ICD-10-CM | POA: Diagnosis not present

## 2022-01-21 DIAGNOSIS — K3184 Gastroparesis: Secondary | ICD-10-CM

## 2022-01-21 DIAGNOSIS — Z124 Encounter for screening for malignant neoplasm of cervix: Secondary | ICD-10-CM

## 2022-01-21 DIAGNOSIS — Z1231 Encounter for screening mammogram for malignant neoplasm of breast: Secondary | ICD-10-CM | POA: Diagnosis not present

## 2022-01-21 DIAGNOSIS — K582 Mixed irritable bowel syndrome: Secondary | ICD-10-CM

## 2022-01-21 DIAGNOSIS — Z1283 Encounter for screening for malignant neoplasm of skin: Secondary | ICD-10-CM

## 2022-01-21 DIAGNOSIS — E785 Hyperlipidemia, unspecified: Secondary | ICD-10-CM | POA: Diagnosis not present

## 2022-01-21 DIAGNOSIS — N760 Acute vaginitis: Secondary | ICD-10-CM

## 2022-01-21 DIAGNOSIS — L304 Erythema intertrigo: Secondary | ICD-10-CM

## 2022-01-21 DIAGNOSIS — L0291 Cutaneous abscess, unspecified: Secondary | ICD-10-CM

## 2022-01-21 LAB — BASIC METABOLIC PANEL
BUN: 14 mg/dL (ref 6–23)
CO2: 27 mEq/L (ref 19–32)
Calcium: 9 mg/dL (ref 8.4–10.5)
Chloride: 97 mEq/L (ref 96–112)
Creatinine, Ser: 0.89 mg/dL (ref 0.40–1.20)
GFR: 78.05 mL/min (ref >60.00)
Glucose, Bld: 254 mg/dL — ABNORMAL HIGH (ref 70–99)
Potassium: 3.7 mEq/L (ref 3.5–5.1)
Sodium: 133 mEq/L — ABNORMAL LOW (ref 135–145)

## 2022-01-21 LAB — HEMOGLOBIN A1C: Hgb A1c MFr Bld: 10.1 % — ABNORMAL HIGH (ref 4.6–6.5)

## 2022-01-21 LAB — LIPID PANEL
Cholesterol: 395 mg/dL — ABNORMAL HIGH (ref 0–200)
HDL: 57.3 mg/dL (ref >39.00)
Total CHOL/HDL Ratio: 7
Triglycerides: 553 mg/dL — ABNORMAL HIGH (ref 0.0–149.0)

## 2022-01-21 LAB — CBC
HCT: 46.2 % — ABNORMAL HIGH (ref 36.0–46.0)
Hemoglobin: 15.3 g/dL — ABNORMAL HIGH (ref 12.0–15.0)
MCHC: 33 g/dL (ref 30.0–36.0)
MCV: 93 fl (ref 78.0–100.0)
Platelets: 247 10*3/uL (ref 150.0–400.0)
RBC: 4.97 Mil/uL (ref 3.87–5.11)
RDW: 15.4 % (ref 11.5–15.5)
WBC: 11.3 10*3/uL — ABNORMAL HIGH (ref 4.0–10.5)

## 2022-01-21 LAB — LDL CHOLESTEROL, DIRECT: Direct LDL: 278 mg/dL

## 2022-01-21 LAB — VITAMIN D 25 HYDROXY (VIT D DEFICIENCY, FRACTURES): VITD: 30.18 ng/mL (ref 30.00–100.00)

## 2022-01-21 MED ORDER — NYSTATIN 100000 UNIT/GM EX POWD: 1.0000 | Freq: Three times a day (TID) | CUTANEOUS | 0 refills | Status: DC

## 2022-01-21 MED ORDER — MUPIROCIN CALCIUM 2 % EX CREA
1.0000 | TOPICAL_CREAM | Freq: Two times a day (BID) | CUTANEOUS | 0 refills | Status: DC
Start: 2022-01-21 — End: 2023-01-04

## 2022-01-21 NOTE — Assessment & Plan Note (Signed)
Resolved per patient Requested records from Dr. Katy Fitch MRI negative for MS

## 2022-01-21 NOTE — Patient Instructions (Signed)
Go to lab  Preventive Care 40-46 Years Old, Female Preventive care refers to lifestyle choices and visits with your health care provider that can promote health and wellness. Preventive care visits are also called wellness exams. What can I expect for my preventive care visit? Counseling Your health care provider may ask you questions about your: Medical history, including: Past medical problems. Family medical history. Pregnancy history. Current health, including: Menstrual cycle. Method of birth control. Emotional well-being. Home life and relationship well-being. Sexual activity and sexual health. Lifestyle, including: Alcohol, nicotine or tobacco, and drug use. Access to firearms. Diet, exercise, and sleep habits. Work and work environment. Sunscreen use. Safety issues such as seatbelt and bike helmet use. Physical exam Your health care provider will check your: Height and weight. These may be used to calculate your BMI (body mass index). BMI is a measurement that tells if you are at a healthy weight. Waist circumference. This measures the distance around your waistline. This measurement also tells if you are at a healthy weight and may help predict your risk of certain diseases, such as type 2 diabetes and high blood pressure. Heart rate and blood pressure. Body temperature. Skin for abnormal spots. What immunizations do I need?  Vaccines are usually given at various ages, according to a schedule. Your health care provider will recommend vaccines for you based on your age, medical history, and lifestyle or other factors, such as travel or where you work. What tests do I need? Screening Your health care provider may recommend screening tests for certain conditions. This may include: Lipid and cholesterol levels. Diabetes screening. This is done by checking your blood sugar (glucose) after you have not eaten for a while (fasting). Pelvic exam and Pap test. Hepatitis B  test. Hepatitis C test. HIV (human immunodeficiency virus) test. STI (sexually transmitted infection) testing, if you are at risk. Lung cancer screening. Colorectal cancer screening. Mammogram. Talk with your health care provider about when you should start having regular mammograms. This may depend on whether you have a family history of breast cancer. BRCA-related cancer screening. This may be done if you have a family history of breast, ovarian, tubal, or peritoneal cancers. Bone density scan. This is done to screen for osteoporosis. Talk with your health care provider about your test results, treatment options, and if necessary, the need for more tests. Follow these instructions at home: Eating and drinking  Eat a diet that includes fresh fruits and vegetables, whole grains, lean protein, and low-fat dairy products. Take vitamin and mineral supplements as recommended by your health care provider. Do not drink alcohol if: Your health care provider tells you not to drink. You are pregnant, may be pregnant, or are planning to become pregnant. If you drink alcohol: Limit how much you have to 0-1 drink a day. Know how much alcohol is in your drink. In the [place] one drink equals one 12 oz bottle of beer (355 mL), one 5 oz glass of wine (148 mL), or one 1 oz glass of hard liquor (44 mL). Lifestyle Brush your teeth every morning and night with fluoride toothpaste. Floss one time each day. Exercise for at least 30 minutes 5 or more days each week. Do not use any products that contain nicotine or tobacco. These products include cigarettes, chewing tobacco, and vaping devices, such as e-cigarettes. If you need help quitting, ask your health care provider. Do not use drugs. If you are sexually active, practice safe sex. Use a condom or other   form of protection to prevent STIs. If you do not wish to become pregnant, use a form of birth control. If you plan to become pregnant, see your health care  provider for a prepregnancy visit. Take aspirin only as told by your health care provider. Make sure that you understand how much to take and what form to take. Work with your health care provider to find out whether it is safe and beneficial for you to take aspirin daily. Find healthy ways to manage stress, such as: Meditation, yoga, or listening to music. Journaling. Talking to a trusted person. Spending time with friends and family. Minimize exposure to UV radiation to reduce your risk of skin cancer. Safety Always wear your seat belt while driving or riding in a vehicle. Do not drive: If you have been drinking alcohol. Do not ride with someone who has been drinking. When you are tired or distracted. While texting. If you have been using any mind-altering substances or drugs. Wear a helmet and other protective equipment during sports activities. If you have firearms in your house, make sure you follow all gun safety procedures. Seek help if you have been physically or sexually abused. What's next? Visit your health care provider once a year for an annual wellness visit. Ask your health care provider how often you should have your eyes and teeth checked. Stay up to date on all vaccines. This information is not intended to replace advice given to you by your health care provider. Make sure you discuss any questions you have with your health care provider. Document Revised: 09/30/2020 Document Reviewed: 09/30/2020 Elsevier Patient Education  [place].

## 2022-01-21 NOTE — Assessment & Plan Note (Signed)
Request to switch to [facility] due to insurance coverage

## 2022-01-21 NOTE — Assessment & Plan Note (Signed)
repeat lipid panel Advised to schedule appt with lipid clinic

## 2022-01-21 NOTE — Assessment & Plan Note (Signed)
Recurrent pustules on breast and groin Resolve with warm compress and bactroban ointment Refill sent

## 2022-01-21 NOTE — Progress Notes (Signed)
Complete physical exam  Patient: Kelsey Vang   DOB: 05-Feb-1976   46 y.o. Female  MRN: [id number] Visit Date: 01/21/2022  Subjective:    Chief Complaint  Patient presents with   Annual Exam    Physical Referral /dermatology Referral Endocrinology Refill Bactroban      Kelsey Vang is a 46 y.o. female who presents today for a complete physical exam. She reports consuming a diabetic,   calorie diet.  Limited due to poor vision  She generally feels fairly well. She reports sleeping fairly well. She does have additional problems to discuss today.  Vision:Yes Dental:No STD Screen:No  Most recent fall risk assessment:    10/25/2021    8:39 AM  Fall Risk   Falls in the past year? 1  Number falls in past yr: 1  Injury with Fall? 1   Most recent depression screenings:    01/21/2022    1:38 PM 07/30/2021   11:15 AM  PHQ 2/9 Scores  PHQ - 2 Score 3 0  PHQ- 9 Score 13    HPI  Diabetic gastroparalysis ([place]) Request to switch to [facility] due to insurance coverage  Type 2 diabetes mellitus with diabetic polyneuropathy, with long-term current use of insulin ([place]) request to switch endocrinology to Dr. Alanson Aly  Abscess Recurrent pustules on breast and groin Resolve with warm compress and bactroban ointment Refill sent  Vitamin D deficiency Repeat vit. D  Optic neuritis Resolved per patient Requested records from Dr. Katy Fitch MRI negative for MS  Hyperlipidemia associated with type 2 diabetes mellitus ([place]) repeat lipid panel Advised to schedule appt with lipid clinic   Past Medical History:  Diagnosis Date   Anxiety    [employer]reesia 06/09/2020   Asthma    Chronic back pain    Depression    [employer]reesia 06/09/2020   Diabetes mellitus without complication (HCC)    Gastroparesis due to DM (HCC)    GERD (gastroesophageal reflux disease)    [employer]reesia 06/09/2020   Hx of migraines    Hyperlipemia    Hyperlipidemia    [employer]reesia 06/09/2020   Hypertension     IBS (irritable bowel syndrome)    Nonarteritic ischemic optic neuropathy, unspecified laterality    Peripheral neuropathy    Sinusitis, chronic 01/17/2019   Sleep apnea    [employer]reesia 06/09/2020   Past Surgical History:  Procedure Laterality Date   CHOLECYSTECTOMY     ENDOMETRIAL ABLATION     GALLBLADDER SURGERY     TUBAL LIGATION     Social History   Socioeconomic History   Marital status: Married    Spouse name: Rodman Key   Number of children: 1   Years of education: Not on file   Highest education level: Not on file  Occupational History   Not on file  Tobacco Use   Smoking status: Every Day    Packs/day: 1.00    Years: 20.00    Total pack years: 20.00    Types: Cigarettes   Smokeless tobacco: Never  Vaping Use   Vaping Use: Former  Substance and Sexual Activity   Alcohol use: No   Drug use: No   Sexual activity: Yes    Birth control/protection: None  Other Topics Concern   Not on file  Social History Narrative   Right handed   Caffeine 16 0z daily   Lives with husband and son, in a one level home   Social Determinants of Health   Financial Resource Strain: Not on  file  Food Insecurity: Not on file  Transportation Needs: Not on file  Physical Activity: Not on file  Stress: Not on file  Social Connections: Not on file  Intimate Partner Violence: Not on file   Family Status  Relation Name Status   Mother  Deceased   Father  Alive   Ethlyn Daniels  (Not Specified)   MGM  Deceased   MGF  Deceased   PGM  Alive   PGF  Deceased   Pat Aunt  Other   Family History  Problem Relation Age of Onset   Cancer Mother    Diabetes Father    Liver disease Father    Migraines Paternal Aunt    Heart attack Maternal Grandmother    Cancer Paternal Grandfather    Migraines Paternal Aunt    Allergies  Allergen Reactions   Metformin And Related Other (See Comments)    GI upset   Metoclopramide Other (See Comments)    headache   Gabapentin Other (See Comments)     hallucination   Keflex [Cephalexin] Rash   Levemir [Insulin Detemir] Dermatitis    Whelps at injection site   Prednisone Palpitations    oral    Patient Care Team: Colin Norment, Charlene Brooke, NP as PCP - General (Internal Medicine) Pieter Partridge, DO as Consulting Physician (Neurology) Philemon Kingdom, MD as Consulting Physician (Internal Medicine) Warden Fillers, MD as Consulting Physician (Ophthalmology) Nehemiah Settle, MD (Gastroenterology) Ricard Dillon, NP as Nurse Practitioner (Nurse Practitioner) Doreatha Martin, MD as Referring Physician (Physical Medicine and Rehabilitation) Deneise Lever, MD as Consulting Physician (Pulmonary Disease)   Medications: Outpatient Medications Prior to Visit  Medication Sig   albuterol (VENTOLIN HFA) 108 (90 Base) MCG/ACT inhaler INHALE 2 PUFFS BY MOUTH EVERY 6 HOURS AS NEEDED FOR WHEEZING FOR SHORTNESS OF BREATH   benzonatate (TESSALON) 200 MG capsule Take 1 capsule (200 mg total) by mouth every 8 (eight) hours as needed for cough.   botulinum toxin Type A (BOTOX) 200 units injection Inject 200 Units into the muscle every 3 (three) months. Inject 155 units IM into multiple site in the face,neck and head once every 90 days.   Budeson-Glycopyrrol-Formoterol (BREZTRI AEROSPHERE) 160-9-4.8 MCG/ACT AERO Inhale 2 puffs into the lungs in the morning and at bedtime.   buPROPion (WELLBUTRIN SR) 150 MG 12 hr tablet Take 1 tablet (150 mg total) by mouth 2 (two) times daily.   Continuous Blood Gluc Receiver (FREESTYLE LIBRE 2 READER) DEVI 1 each by Does not apply route daily.   Continuous Blood Gluc Sensor (FREESTYLE LIBRE 2 SENSOR) MISC 1 each by Does not apply route every 14 (fourteen) days.   cyclobenzaprine (FLEXERIL) 10 MG tablet Take 10 mg by mouth 2 (two) times daily as needed for muscle spasms.   diphenoxylate-atropine (LOMOTIL) 2.5-0.025 MG tablet Take 1 tablet by mouth 4 (four) times daily as needed for diarrhea or loose stools.   dronabinol  (MARINOL) 2.5 MG capsule Take 2.5 mg by mouth 2 (two) times daily before a meal.   empagliflozin (JARDIANCE) 10 MG TABS tablet Take 1 tablet (10 mg total) by mouth daily before breakfast.   escitalopram (LEXAPRO) 10 MG tablet Take 10 mg by mouth at bedtime.   famotidine (PEPCID) 40 MG tablet Take 40 mg by mouth 2 (two) times daily.   fluticasone (FLONASE) 50 MCG/ACT nasal spray Place 2 sprays into both nostrils 2 (two) times daily.   glucose blood (FREESTYLE PRECISION NEO TEST) test strip Use as instructed to  check blood sugar 4X daily   guaiFENesin (MUCINEX) 600 MG 12 hr tablet Take 600 mg by mouth 2 (two) times daily.   hydrOXYzine (ATARAX) 25 MG tablet Take 25 mg by mouth daily as needed.   hyoscyamine (LEVBID) 0.375 MG 12 hr tablet Take 0.375 mg by mouth 2 (two) times daily.   Hyprom-Naphaz-Polysorb-Zn Sulf (CLEAR EYES COMPLETE OP) Place 1 drop into both eyes daily as needed.   ibuprofen (ADVIL) 200 MG tablet Take 800 mg by mouth every 12 (twelve) hours as needed for moderate pain.   Insulin Glargine (BASAGLAR KWIKPEN) 100 UNIT/ML Inject 35-45 Units into the skin See admin instructions. Take 35 units in the morning and 45 units at night   insulin lispro (HUMALOG KWIKPEN) 200 UNIT/ML KwikPen Inject 25-35 Units into the skin 3 (three) times daily before meals.   ondansetron (ZOFRAN) 4 MG tablet Take 1 tablet (4 mg total) by mouth every 8 (eight) hours as needed for nausea or vomiting.   pantoprazole (PROTONIX) 40 MG tablet Take 40 mg by mouth 2 (two) times daily.   polycarbophil (FIBERCON) 625 MG tablet Take 1,250 mg by mouth in the morning and at bedtime.   pregabalin (LYRICA) 150 MG capsule Take 150 mg by mouth daily.   promethazine (PHENERGAN) 12.5 MG tablet Take 12.5 mg by mouth every 6 (six) hours as needed for nausea or vomiting.   Ubrogepant (UBRELVY) 100 MG TABS Take 100 mg by mouth as needed (take 1 tablet at the earlist onset of Migraine. May repeat in 2 hours. max 2 tabs in 24 hours).  (Patient taking differently: Take 100 mg by mouth daily as needed (take 1 tablet at the earlist onset of Migraine. May repeat in 2 hours. max 2 tabs in 24 hours).)   Vitamin D, Ergocalciferol, (DRISDOL) 1.25 MG (50000 UNIT) CAPS capsule Take 1 capsule (50,000 Units total) by mouth every 7 (seven) days. (Patient taking differently: Take 50,000 Units by mouth every 7 (seven) days. Mondays)   [DISCONTINUED] mupirocin cream (BACTROBAN) 2 % Apply 1 application. topically 2 (two) times daily.   [DISCONTINUED] aspirin EC 81 MG tablet Take 81 mg by mouth daily. Swallow whole. (Patient not taking: Reported on 01/21/2022)   [DISCONTINUED] Erenumab-aooe (AIMOVIG) 140 MG/ML SOAJ Inject 140 mg into the skin every 28 (twenty-eight) days. (Patient not taking: Reported on 01/21/2022)   [DISCONTINUED] UNIFINE PENTIPS 32G X 4 MM MISC USE TO INJECT INSULIN 4 TIMES DAILY (Patient not taking: Reported on 01/21/2022)   No facility-administered medications prior to visit.    Review of Systems  Constitutional:  Negative for fever.  HENT:  Negative for congestion and sore throat.   Eyes:        Negative for visual changes  Respiratory:  Negative for cough and shortness of breath.   Cardiovascular:  Negative for chest pain, palpitations and leg swelling.  Gastrointestinal:  Negative for blood in stool, constipation and diarrhea.  Genitourinary:  Negative for dysuria, frequency and urgency.  Musculoskeletal:  Negative for myalgias.  Skin:  Positive for rash.  Neurological:  Negative for dizziness and headaches.  Hematological:  Does not bruise/bleed easily.  Psychiatric/Behavioral:  Negative for suicidal ideas. The patient is not nervous/anxious.         Objective:  BP 92/78 (BP Location: Left Arm, Patient Position: Sitting, Cuff Size: Large)   Pulse 96   Temp (!) 97.3 F (36.3 C) (Temporal)   Ht '5\' 1"'  (1.549 m)   Wt 271 lb (122.9 kg)  SpO2 94%   BMI 51.21 kg/m    Wt Readings from Last 3 Encounters:   01/21/22 271 lb (122.9 kg)  01/07/22 274 lb 6.4 oz (124.5 kg)  11/12/21 267 lb (121.1 kg)    BP Readings from Last 3 Encounters:  01/21/22 92/78  01/07/22 96/64  11/12/21 124/89    Physical Exam Vitals and nursing note reviewed. Exam conducted with a chaperone present.  Constitutional:      General: She is not in acute distress.    Appearance: She is well-developed. She is obese.  HENT:     Right Ear: Tympanic membrane, ear canal and external ear normal.     Left Ear: Tympanic membrane, ear canal and external ear normal.     Nose: Nose normal.     Mouth/Throat:     Pharynx: No oropharyngeal exudate.  Eyes:     Extraocular Movements: Extraocular movements intact.     Conjunctiva/sclera: Conjunctivae normal.  Cardiovascular:     Rate and Rhythm: Normal rate and regular rhythm.     Pulses: Normal pulses.     Heart sounds: Normal heart sounds.  Pulmonary:     Effort: Pulmonary effort is normal. No respiratory distress.     Breath sounds: Normal breath sounds.  Chest:     Chest wall: No tenderness.  Breasts:    Breasts are symmetrical.     Right: Normal.     Left: Normal.  Abdominal:     General: Bowel sounds are normal.     Palpations: Abdomen is soft.     Hernia: There is no hernia in the left inguinal area or right inguinal area.  Genitourinary:    General: Normal vulva.     Labia:        Right: No rash, tenderness or lesion.        Left: No rash, tenderness or lesion.      Vagina: Normal. No vaginal discharge.     Cervix: Normal.     Uterus: Normal.      Adnexa: Right adnexa normal and left adnexa normal.  Musculoskeletal:        General: Normal range of motion.     Cervical back: Normal range of motion and neck supple.     Right lower leg: No edema.     Left lower leg: No edema.  Lymphadenopathy:     Upper Body:     Right upper body: No supraclavicular, axillary or pectoral adenopathy.     Left upper body: No supraclavicular, axillary or pectoral adenopathy.      Lower Body: No right inguinal adenopathy. No left inguinal adenopathy.  Skin:    General: Skin is warm and dry.     Findings: Erythema, lesion and rash present. Rash is pustular.       Neurological:     Mental Status: She is alert and oriented to person, place, and time.     Deep Tendon Reflexes: Reflexes are normal and symmetric.  Psychiatric:        Mood and Affect: Mood normal.        Behavior: Behavior normal.        Thought Content: Thought content normal.     No results found for any visits on 01/21/22.    Assessment & Plan:    Routine Health Maintenance and Physical Exam  Immunization History  Administered Date(s) Administered   Hepatitis B, PED/ADOLESCENT 05/03/2013, 05/31/2013, 08/27/2013   Hepatitis B, adult 05/03/2013, 05/31/2013, 08/27/2013   Influenza Split  12/27/2013   Influenza, Seasonal, Injecte, Preservative Fre 01/15/2015   Influenza,inj,Quad PF,6+ Mos 01/15/2015, 02/04/2019, 01/04/2021, 01/07/2022   Influenza-Unspecified 12/27/2013, 01/15/2015, 12/09/2017, 01/23/2019   MMR 08/27/2013   PFIZER(Purple Top)SARS-COV-2 Vaccination 08/20/2019, 03/27/2020   PPD Test 12/27/2013, 01/15/2014   Pneumococcal Polysaccharide-23 10/07/2014   Tdap 10/07/2014   Varicella 05/03/2013, 08/27/2013   Health Maintenance  Topic Date Due   PAP SMEAR-Modifier  09/23/2020   HEMOGLOBIN A1C  12/09/2021   Hepatitis C Screening  01/22/2023 (Originally 03/06/1994)   HIV Screening  01/22/2023 (Originally [birth year]07/1991)   OPHTHALMOLOGY EXAM  05/11/2022   Diabetic kidney evaluation - Urine ACR  06/11/2022   FOOT EXAM  06/11/2022   Diabetic kidney evaluation - GFR measurement  08/29/2022   TETANUS/TDAP  10/06/2024   COLONOSCOPY (Pts 45-24yr Insurance coverage will need to be confirmed)  12/15/2028   INFLUENZA VACCINE  Completed   HPV VACCINES  Aged Out   COVID-19 Vaccine  Discontinued   Discussed health benefits of physical activity, and encouraged her to engage in regular  exercise appropriate for her age and condition.  Problem List Items Addressed This Visit       Digestive   Diabetic gastroparalysis ([facility]    [facility]      Relevant Orders   Ambulatory referral to Gastroenterology   Irritable bowel syndrome with both constipation and diarrhea   Relevant Orders   Ambulatory referral to Gastroenterology     Endocrine   Hyperlipidemia associated with [place] 2 diabetes mellitus [place]    repeat lipid panel Advised to schedule appt with lipid clinic      Relevant Orders   Lipid panel   Type 2 diabetes mellitus with diabetic polyneuropathy, with long-term current use of insulin ([place]    request to switch endocrinology to Dr. MAlanson Aly     Relevant Orders   Hemoglobin A1c   Ambulatory referral to Endocrinology     Nervous and Auditory   Optic neuritis    Resolved per patient Requested records from Dr. GKaty FitchMRI negative for MS        Other   Abscess    Recurrent pustules on breast and groin Resolve with warm compress and bactroban ointment Refill sent      Vitamin D deficiency    Repeat vit. D      Relevant Orders   Vitamin D (25 hydroxy)   Other Visit Diagnoses     Encounter for preventative adult health care exam with abnormal findings    -  Primary   Relevant Orders   CBC   Basic metabolic panel   MM 3D SCREEN BREAST BILATERAL   Cytology - PAP   Skin cancer screening       Relevant Orders   Ambulatory referral to Dermatology   Encounter for Papanicolaou smear for cervical cancer screening       Relevant Orders   Cytology - PAP   Breast cancer screening by mammogram       Relevant Orders   MM 3D SCREEN BREAST BILATERAL   Folliculitis       Relevant Orders   Ambulatory referral to Dermatology   Intertrigo       Relevant Medications   nystatin (MYCOSTATIN/NYSTOP) powder      Return in about 6 months (around 07/23/2022) for HTN, hyperlipidemia (fasting).      CWilfred Lacy NP

## 2022-01-21 NOTE — Assessment & Plan Note (Signed)
Repeat vit. D °

## 2022-01-21 NOTE — Assessment & Plan Note (Signed)
request to switch endocrinology to Dr. Alanson Aly

## 2022-01-22 MED ORDER — FENOFIBRATE 145 MG PO TABS: 145.0000 mg | ORAL_TABLET | Freq: Every day | ORAL | 1 refills | Status: DC

## 2022-01-22 NOTE — Addendum Note (Signed)
Addended by: Wilfred Lacy L on: 01/22/2022 10:03 AM   Modules accepted: Orders

## 2022-01-25 LAB — CYTOLOGY - PAP
Comment: NEGATIVE
Diagnosis: NEGATIVE
High risk HPV: NEGATIVE

## 2022-01-26 MED ORDER — FLUCONAZOLE 150 MG PO TABS: 150.0000 mg | ORAL_TABLET | Freq: Every day | ORAL | 0 refills | Status: DC

## 2022-01-26 NOTE — Addendum Note (Signed)
Addended by: Wilfred Lacy L on: 01/26/2022 04:29 PM   Modules accepted: Orders

## 2022-02-01 ENCOUNTER — Encounter: Payer: Self-pay | Admitting: Nurse Practitioner

## 2022-02-25 ENCOUNTER — Ambulatory Visit: Payer: Commercial Managed Care - HMO

## 2022-03-03 ENCOUNTER — Other Ambulatory Visit: Payer: Self-pay

## 2022-03-08 ENCOUNTER — Encounter: Payer: Self-pay | Admitting: Internal Medicine

## 2022-03-08 DIAGNOSIS — Z794 Long term (current) use of insulin: Secondary | ICD-10-CM

## 2022-03-08 MED ORDER — HUMALOG KWIKPEN 200 UNIT/ML ~~LOC~~ SOPN: 25.0000 [IU] | PEN_INJECTOR | Freq: Three times a day (TID) | SUBCUTANEOUS | 0 refills | Status: DC

## 2022-03-08 MED ORDER — TRESIBA FLEXTOUCH 200 UNIT/ML ~~LOC~~ SOPN: 68.0000 [IU] | PEN_INJECTOR | Freq: Every day | SUBCUTANEOUS | 0 refills | Status: DC

## 2022-03-11 ENCOUNTER — Ambulatory Visit: Payer: Commercial Managed Care - HMO | Admitting: Internal Medicine

## 2022-03-12 ENCOUNTER — Telehealth: Payer: Self-pay | Admitting: Urgent Care

## 2022-03-12 DIAGNOSIS — N3000 Acute cystitis without hematuria: Secondary | ICD-10-CM

## 2022-03-12 DIAGNOSIS — N760 Acute vaginitis: Secondary | ICD-10-CM

## 2022-03-12 MED ORDER — FLUCONAZOLE 150 MG PO TABS: 150.0000 mg | ORAL_TABLET | Freq: Every day | ORAL | 0 refills | Status: DC

## 2022-03-12 MED ORDER — NITROFURANTOIN MONOHYD MACRO 100 MG PO CAPS: 100.0000 mg | ORAL_CAPSULE | Freq: Two times a day (BID) | ORAL | 0 refills | Status: AC

## 2022-03-12 NOTE — Progress Notes (Signed)
E-Visit for Vaginal Symptoms  We are sorry that you are not feeling well. Here is how we plan to help! Based on what you shared with me it looks like you: May have a yeast vaginosis  Vaginosis is an inflammation of the vagina that can result in discharge, itching and pain. The cause is usually a change in the normal balance of vaginal bacteria or an infection. Vaginosis can also result from reduced estrogen levels after menopause.  The most common causes of vaginosis are:   Bacterial vaginosis which results from an overgrowth of one on several organisms that are normally present in your vagina.   Yeast infections which are caused by a naturally occurring fungus called candida.   Vaginal atrophy (atrophic vaginosis) which results from the thinning of the vagina from reduced estrogen levels after menopause.   Trichomoniasis which is caused by a parasite and is commonly transmitted by sexual intercourse.  Factors that increase your risk of developing vaginosis include: Medications, such as antibiotics and steroids Uncontrolled diabetes Use of hygiene products such as bubble bath, vaginal spray or vaginal deodorant Douching Wearing damp or tight-fitting clothing Using an intrauterine device (IUD) for birth control Hormonal changes, such as those associated with pregnancy, birth control pills or menopause Sexual activity Having a sexually transmitted infection  Your treatment plan is A single Diflucan (fluconazole) 150mg tablet once.  I have electronically sent this prescription into the pharmacy that you have chosen.  Be sure to take all of the medication as directed. Stop taking any medication if you develop a rash, tongue swelling or shortness of breath. Mothers who are breast feeding should consider pumping and discarding their breast milk while on these antibiotics. However, there is no consensus that infant exposure at these doses would be harmful.  Remember that medication creams can  weaken latex condoms. .   HOME CARE:  Good hygiene may prevent some types of vaginosis from recurring and may relieve some symptoms:  Avoid baths, hot tubs and whirlpool spas. Rinse soap from your outer genital area after a shower, and dry the area well to prevent irritation. Don't use scented or harsh soaps, such as those with deodorant or antibacterial action. Avoid irritants. These include scented tampons and pads. Wipe from front to back after using the toilet. Doing so avoids spreading fecal bacteria to your vagina.  Other things that may help prevent vaginosis include:  Don't douche. Your vagina doesn't require cleansing other than normal bathing. Repetitive douching disrupts the normal organisms that reside in the vagina and can actually increase your risk of vaginal infection. Douching won't clear up a vaginal infection. Use a latex condom. Both female and female latex condoms may help you avoid infections spread by sexual contact. Wear cotton underwear. Also wear pantyhose with a cotton crotch. If you feel comfortable without it, skip wearing underwear to bed. Yeast thrives in moist environments Your symptoms should improve in the next day or two.  GET HELP RIGHT AWAY IF:  You have pain in your lower abdomen ( pelvic area or over your ovaries) You develop nausea or vomiting You develop a fever Your discharge changes or worsens You have persistent pain with intercourse You develop shortness of breath, a rapid pulse, or you faint.  These symptoms could be signs of problems or infections that need to be evaluated by a medical provider now.  MAKE SURE YOU   Understand these instructions. Will watch your condition. Will get help right away if you are not   doing well or get worse.   E-Visit for Urinary Problems  Additionally, based on what you shared with me it looks like you most likely have a simple urinary tract infection.  A UTI (Urinary Tract Infection) is a bacterial  infection of the bladder.  Most cases of urinary tract infections are simple to treat but a key part of your care is to encourage you to drink plenty of fluids and watch your symptoms carefully.  I have prescribed MacroBid 100 mg twice a day for 5 days.  Your symptoms should gradually improve. Call us if the burning in your urine worsens, you develop worsening fever, back pain or pelvic pain or if your symptoms do not resolve after completing the antibiotic.  Urinary tract infections can be prevented by drinking plenty of water to keep your body hydrated.  Also be sure when you wipe, wipe from front to back and don't hold it in!  If possible, empty your bladder every 4 hours.  HOME CARE Drink plenty of fluids Compete the full course of the antibiotics even if the symptoms resolve Remember, when you need to go.go. Holding in your urine can increase the likelihood of getting a UTI! GET HELP RIGHT AWAY IF: You cannot urinate You get a high fever Worsening back pain occurs You see blood in your urine You feel sick to your stomach or throw up You feel like you are going to pass out  MAKE SURE YOU  Understand these instructions. Will watch your condition. Will get help right away if you are not doing well or get worse.   Thank you for choosing an e-visit.  Your e-visit answers were reviewed by a board certified advanced clinical practitioner to complete your personal care plan. Depending upon the condition, your plan could have included both over the counter or prescription medications.  Please review your pharmacy choice. Make sure the pharmacy is open so you can pick up prescription now. If there is a problem, you may contact your provider through [employer] and have the prescription routed to another pharmacy.  Your safety is important to [place]. If you have drug allergies check your prescription carefully.   For the next 24 hours you can use MyChart to ask questions about today's  visit, request a non-urgent call back, or ask for a work or school excuse. You will get an email in the next two days asking about your experience. I hope that your e-visit has been valuable and will speed your recovery.   I have spent 5 minutes in review of e-visit questionnaire, review and updating patient chart, medical decision making and response to patient.   Genine Beckett L Damarrion Mimbs, PA

## 2022-03-18 ENCOUNTER — Other Ambulatory Visit: Payer: Self-pay | Admitting: Neurology

## 2022-03-18 ENCOUNTER — Ambulatory Visit (INDEPENDENT_AMBULATORY_CARE_PROVIDER_SITE_OTHER): Payer: Commercial Managed Care - HMO | Admitting: Neurology

## 2022-03-18 DIAGNOSIS — G43709 Chronic migraine without aura, not intractable, without status migrainosus: Secondary | ICD-10-CM

## 2022-03-18 LAB — HM DIABETES EYE EXAM

## 2022-03-18 MED ORDER — ONABOTULINUMTOXINA 100 UNITS IJ SOLR
200.0000 [IU] | Freq: Once | INTRAMUSCULAR | Status: AC
  Administered 2022-03-18: 155 [IU] via INTRAMUSCULAR

## 2022-03-18 NOTE — Progress Notes (Signed)
[facility]   Procedure Note Botox  Attending: Dr. Shon Millet  Preoperative Diagnosis(es): Chronic migraine  Consent obtained from: The patient Benefits discussed included, but were not limited to decreased muscle tightness, increased joint range of motion, and decreased pain.  Risk discussed included, but were not limited pain and discomfort, bleeding, bruising, excessive weakness, venous thrombosis, muscle atrophy and dysphagia.  Anticipated outcomes of the procedure as well as he risks and benefits of the alternatives to the procedure, and the roles and tasks of the personnel to be involved, were discussed with the patient, and the patient consents to the procedure and agrees to proceed. A copy of the patient medication guide was given to the patient which explains the blackbox warning.  Patients identity and treatment sites confirmed Yes.  .  Details of Procedure: Skin was cleaned with alcohol. Prior to injection, the needle plunger was aspirated to make sure the needle was not within a blood vessel.  There was no blood retrieved on aspiration.    Following is a summary of the muscles injected  And the amount of Botulinum toxin used:  Dilution 200 units of Botox was reconstituted with 4 ml of preservative free normal saline. Time of reconstitution: At the time of the office visit (<30 minutes prior to injection)   Injections  155 total units of Botox was injected with a 30 gauge needle.  Injection Sites: L occipitalis: 15 units- 3 sites  R occiptalis: 15 units- 3 sites  L upper trapezius: 15 units- 3 sites R upper trapezius: 15 units- 3 sits          L paraspinal: 10 units- 2 sites R paraspinal: 10 units- 2 sites  Face L frontalis(2 injection sites):10 units   R frontalis(2 injection sites):10 units         L corrugator: 5 units   R corrugator: 5 units           Procerus: 5 units   L temporalis: 20 units R temporalis: 20 units   Agent:  200 units of botulinum Type  A (Onobotulinum Toxin type A) was reconstituted with 4 ml of preservative free normal saline.  Time of reconstitution: At the time of the office visit (<30 minutes prior to injection)     Total injected (Units):  155  Total wasted (Units):  45  Patient tolerated procedure well without complications.   Reinjection is anticipated in 3 months. Return to clinic in approximately 6 weeks

## 2022-03-18 NOTE — Progress Notes (Signed)
Bernita Raisin not as effective anymore.  Will provide her with samples of Nurtec to try.

## 2022-03-23 ENCOUNTER — Ambulatory Visit (HOSPITAL_BASED_OUTPATIENT_CLINIC_OR_DEPARTMENT_OTHER): Payer: Commercial Managed Care - HMO | Admitting: Internal Medicine

## 2022-03-25 ENCOUNTER — Ambulatory Visit: Payer: Commercial Managed Care - HMO

## 2022-03-28 ENCOUNTER — Other Ambulatory Visit: Payer: Self-pay | Admitting: Gastroenterology

## 2022-03-28 DIAGNOSIS — R110 Nausea: Secondary | ICD-10-CM

## 2022-04-07 ENCOUNTER — Encounter: Payer: Self-pay | Admitting: Neurology

## 2022-04-07 ENCOUNTER — Other Ambulatory Visit: Payer: Self-pay | Admitting: Neurology

## 2022-04-07 MED ORDER — NURTEC 75 MG PO TBDP
75.0000 mg | ORAL_TABLET | Freq: Every day | ORAL | 11 refills | Status: DC | PRN
Start: 2022-04-07 — End: 2023-04-17

## 2022-04-08 ENCOUNTER — Other Ambulatory Visit (HOSPITAL_COMMUNITY): Payer: Self-pay

## 2022-04-08 ENCOUNTER — Telehealth: Payer: Self-pay

## 2022-04-08 NOTE — Telephone Encounter (Signed)
Pharmacy Patient Advocate Encounter  Prior Authorization for Nurtec 75MG  dispersible tablets has been approved.    PA# PA Case ID:  Effective dates: 04/08/2022 through 04/08/2023.  Ran a test claim - per test billing claim PT co pay is zero.

## 2022-04-15 ENCOUNTER — Other Ambulatory Visit: Payer: Commercial Managed Care - HMO

## 2022-04-16 ENCOUNTER — Telehealth: Payer: Commercial Managed Care - HMO | Admitting: Nurse Practitioner

## 2022-04-16 DIAGNOSIS — J329 Chronic sinusitis, unspecified: Secondary | ICD-10-CM | POA: Diagnosis not present

## 2022-04-16 DIAGNOSIS — B9689 Other specified bacterial agents as the cause of diseases classified elsewhere: Secondary | ICD-10-CM | POA: Diagnosis not present

## 2022-04-16 MED ORDER — DOXYCYCLINE HYCLATE 100 MG PO TABS: 100.0000 mg | ORAL_TABLET | Freq: Two times a day (BID) | ORAL | 0 refills | Status: DC

## 2022-04-16 NOTE — Progress Notes (Signed)
I have spent 5 minutes in review of e-visit questionnaire, review and updating patient chart, medical decision making and response to patient.  ° °Jakeria Caissie W Nioka Thorington, NP ° °  °

## 2022-04-16 NOTE — Progress Notes (Signed)

## 2022-04-22 ENCOUNTER — Other Ambulatory Visit (INDEPENDENT_AMBULATORY_CARE_PROVIDER_SITE_OTHER): Payer: Commercial Managed Care - HMO

## 2022-04-22 DIAGNOSIS — E785 Hyperlipidemia, unspecified: Secondary | ICD-10-CM

## 2022-04-22 DIAGNOSIS — E1169 Type 2 diabetes mellitus with other specified complication: Secondary | ICD-10-CM | POA: Diagnosis not present

## 2022-04-22 LAB — LIPID PANEL
Cholesterol: 355 mg/dL — ABNORMAL HIGH (ref 0–200)
HDL: 50.4 mg/dL (ref >39.00)
Total CHOL/HDL Ratio: 7
Triglycerides: 698 mg/dL — ABNORMAL HIGH (ref 0.0–149.0)

## 2022-04-22 LAB — LDL CHOLESTEROL, DIRECT: Direct LDL: 225 mg/dL

## 2022-04-27 ENCOUNTER — Encounter: Payer: Self-pay | Admitting: Internal Medicine

## 2022-04-27 ENCOUNTER — Encounter: Payer: Self-pay | Admitting: Nurse Practitioner

## 2022-04-27 DIAGNOSIS — E1169 Type 2 diabetes mellitus with other specified complication: Secondary | ICD-10-CM

## 2022-04-27 DIAGNOSIS — G4733 Obstructive sleep apnea (adult) (pediatric): Secondary | ICD-10-CM

## 2022-04-27 NOTE — Telephone Encounter (Signed)
Mychart message sent by pt: [name] (supporting Deneise Lever, MD)1 hour ago (7:43 AM)    Dr. Annamaria Boots, I am not going to be able to use the cpap machine. The straps are causing horrible headaches due to the pressure of it sitting on migraine trigger spots. I wake up with a severe sore throat every time I use the machine regardless of how high I have the humidity. I am going to call [facility] and find out how to return the machine so they will stop charging me for it.    Thanks,  Kelsey Vang     Dr. Annamaria Boots, please advise.

## 2022-04-27 NOTE — Telephone Encounter (Signed)
Order- [facility]- d/c CPAP I would be happy to sit down with Mr Harnish and see if there are other options he would be interested in learning about.

## 2022-04-28 MED ORDER — EZETIMIBE 10 MG PO TABS: 10.0000 mg | ORAL_TABLET | Freq: Every day | ORAL | 3 refills | Status: DC

## 2022-04-28 MED ORDER — FENOFIBRATE 145 MG PO TABS: 145.0000 mg | ORAL_TABLET | Freq: Every day | ORAL | 1 refills | Status: AC

## 2022-05-05 ENCOUNTER — Ambulatory Visit: Payer: Commercial Managed Care - HMO | Admitting: Internal Medicine

## 2022-05-06 ENCOUNTER — Ambulatory Visit
Admission: RE | Admit: 2022-05-06 | Discharge: 2022-05-06 | Disposition: A | Discharge status: Home / Self Care | Payer: Commercial Managed Care - HMO | Source: Ambulatory Visit | Attending: Gastroenterology | Admitting: Gastroenterology

## 2022-05-06 DIAGNOSIS — R110 Nausea: Secondary | ICD-10-CM

## 2022-05-09 NOTE — Telephone Encounter (Signed)
Ubrelvy sent. ?

## 2022-05-11 NOTE — Progress Notes (Deleted)
NEUROLOGY FOLLOW UP OFFICE NOTE  Evanjelina Ockerman [id number]  Assessment/Plan:   Bilateral non-arteritic optic neuropathy - likely due to controlled diabetes Chronic migraine with aura, without status migrainosus, not intractable - over 15 headache days a month for over 3 consecutive months - failed Aimovig, beta blocker, amitriptyline, topiramate - candidate for Botox Obstructive sleep apnea     Migraine prevention: *** Migraine rescue:  Ubrelvy '50mg'$  Advised to start ASA '81mg'$  daily and optimize glycemic control and cholesterol management Limit use of pain relievers to no more than 2 days out of week to prevent risk of rebound or medication-overuse headache. Keep headache diary ***    Subjective:  Kelsey Vang is a 47 year old female with diabetes who follows up for bilateral non-arteritic optic neuropathy and migraines.   UPDATE: Migraines: Changed from Aimovig to Botox.  Status post *** Intensity:  severe Duration:  1 day Frequency:  2-3 times a week Having a tension type headache in back of neck and head daily.  - treats with lidocaine roll on and Flexeril at bedtime (mostly for back) Frequency of abortive medication: Excedrin 3 days a week. Current NSAIDS: ibuprofen '800mg'$  BID (daily for back) Current analgesics: none Current triptans: None Current ergotamine: None Current anti-emetic: None Current muscle relaxants: Zanflex '2mg'$  (just prescribed today for back pain) Current anti-anxiolytic: None Current sleep aide: None Current Antihypertensive medications: Metoprolol, lisinopril Current Antidepressant medications: sertraline Current Anticonvulsant medications: None Current anti-CGRP:Ubrelvy '100mg'$  Current Vitamins/Herbal/Supplements: None Current Antihistamines/Decongestants: Flonase, Zyrtec Other therapy: Botox Hormone/birth control: none   Caffeine:  No coffee.  Will drink caffeine with headache Alcohol: No Smoker: No Diet: Hydrates.  Does not skip  meals Exercise: Walks Depression: No; Anxiety: No Other pain: back pain  Sleep hygiene: Poor.  Has OSA and compliant with CPAP   HISTORY: In October 2022, she developed blurred vision followed by darkening vision in the lower half of visual field her left eye.  No associated eye pain but she is on Lyrica for chronic back and leg pain.  She reports intermittent burst of blue and red colors in the left eye, and vision looks red with faded bluish green with eyes closed.  She went to her optometrist last week whose exam revealed left optic neuritis and disc hemorrhage with full visual fields and no APD.  Due to persistent vision loss, she received round of Solu-Medrol for optic neuritis.  No improvement, so she did receive Acthar but also no improvement.  Underwent workup for optic neuritis: MRI of brain and orbits with and without contrast on 02/22/2021 was limited due to motion but revealed only mild nonspecific white matter T2/FLAIR hyperintensities but otherwise no obvious findings of MS or abnormal enhancement of brain and optic nerves.  MRI of cervical and thoracic spine with and without contrast on 02/24/2021 revealed mild spinal and bilateral foraminal stenosis at C6-7 and mild right foraminal narrowing at C4-5 and C5-6 but no spinal cord abnormalities such as demyelination.  She underwent LP on 03/01/2021 for CSF analysis which demonstrated opening pressure of 15.5 cm water, CSF cell count 3, protein 80 (patient diabetic), glucose 94 (patient diabetic), no oligoclonal bands, IgG 3, negative ACE, negative culture, negative cytology, nonreactive VDRL.  Serum labs include negative NMO IgG, negative ANA, sed rate 44, B12 438, ACE 36, vit A 64.  Due to insurance reasons, she was unable to get an ophthalmology appointment until 05/11/2021.  She saw Dr. Katy Fitch who actually noted bilateral non-arteritic optic neuropathy (each episode  occurred after COVID).  We referred her to sleep medicine and she has upcoming  appointment.  Hgb A1c from [place] was 8.9.   Migraines: Onset: Since late teens. Increased frequency for past year. Location:  diffuse Quality:  "lightening bolt" Initial intensity:  Severe..  Sometimes wakes her from sleep.  She denies new headache. Aura:  Occasionally may see flashing lights when her eyes are closed.  Difficult to focus Prodrome:  no Postdrome:  tired Associated symptoms: Nausea, vomiting, photophobia, phonophobia, osmophobia, blurred vision.  No autonomic symptoms.  She denies associated unilateral numbness or weakness. Initial duration:  With treatment, 2 hours severe but less severe for entire day (sometimes 2 days). Initial Frequency:  1 to 2 days a week but has constant dull left frontal/top headache Initial Frequency of abortive medication: ibuprofen 3 days a week.  Sumatriptan 1 to 2 days a week. Triggers: None Relieving factors:  rest Activity:  Cannot function at all once every few months.   Past NSAIDS:  no Past analgesics:  Tylenol, Fioricet Past abortive triptans:  Maxalt, sumatriptan - contraindicated (ischemic optic neuropathy) Past muscle relaxants:  Flexeril Past anti-emetic:  no Past antihypertensive medications:  no Past antidepressant medications:   Amitriptyline 100 mg (ineffective, cause palpitations) Past anticonvulsant medications:  Lyrica, topiramate (hallucinations), gabapentin (hallucinations) Past anti-CGRP:  Ajovy (insurance issue), Aimovig '140mg'$  Past vitamins/Herbal/Supplements:  no Past antihistamines/decongestants:  no Other past therapies:  no   Family history of headache:  mother  PAST MEDICAL HISTORY: Past Medical History:  Diagnosis Date   Anxiety    [employer] 06/09/2020   Asthma    Chronic back pain    Depression    [employer] 06/09/2020   Diabetes mellitus without complication ([place])    Gastroparesis due to DM ([place])    GERD (gastroesophageal reflux disease)    [employer] 06/09/2020   Hx of migraines    Hyperlipemia     Hyperlipidemia    [facility] 06/09/2020   Hypertension    IBS (irritable bowel syndrome)    Nonarteritic ischemic optic neuropathy, unspecified laterality    Peripheral neuropathy    Sinusitis, chronic 01/17/2019   Sleep apnea    [facility] 06/09/2020    MEDICATIONS: Current Outpatient Medications on File Prior to Visit  Medication Sig Dispense Refill   Rimegepant Sulfate (NURTEC) 75 MG TBDP Take 75 mg by mouth daily as needed. 8 tablet 11   albuterol (VENTOLIN HFA) 108 (90 Base) MCG/ACT inhaler INHALE 2 PUFFS BY MOUTH EVERY 6 HOURS AS NEEDED FOR WHEEZING FOR SHORTNESS OF BREATH 9 g 0   benzonatate (TESSALON) 200 MG capsule Take 1 capsule (200 mg total) by mouth every 8 (eight) hours as needed for cough. 50 capsule 2   botulinum toxin Type A (BOTOX) 200 units injection Inject 200 Units into the muscle every 3 (three) months. Inject 155 units IM into multiple site in the face,neck and head once every 90 days. 1 each 4   Budeson-Glycopyrrol-Formoterol (BREZTRI AEROSPHERE) 160-9-4.8 MCG/ACT AERO Inhale 2 puffs into the lungs in the morning and at bedtime. 5.9 g 0   buPROPion (WELLBUTRIN SR) 150 MG 12 hr tablet Take 1 tablet (150 mg total) by mouth 2 (two) times daily. 60 tablet 3   Continuous Blood Gluc Receiver (FREESTYLE LIBRE 2 READER) DEVI 1 each by Does not apply route daily. 1 each 0   Continuous Blood Gluc Sensor (FREESTYLE LIBRE 2 SENSOR) MISC 1 each by Does not apply route every 14 (fourteen) days. 6 each 3  cyclobenzaprine (FLEXERIL) 10 MG tablet Take 10 mg by mouth 2 (two) times daily as needed for muscle spasms.     diphenoxylate-atropine (LOMOTIL) 2.5-0.025 MG tablet Take 1 tablet by mouth 4 (four) times daily as needed for diarrhea or loose stools.     doxycycline (VIBRA-TABS) 100 MG tablet Take 1 tablet (100 mg total) by mouth 2 (two) times daily. 20 tablet 0   dronabinol (MARINOL) 2.5 MG capsule Take 2.5 mg by mouth 2 (two) times daily before a meal.     empagliflozin (JARDIANCE)  10 MG TABS tablet Take 1 tablet (10 mg total) by mouth daily before breakfast. 90 tablet 3   escitalopram (LEXAPRO) 10 MG tablet Take 10 mg by mouth at bedtime.     ezetimibe (ZETIA) 10 MG tablet Take 1 tablet (10 mg total) by mouth daily. 90 tablet 3   famotidine (PEPCID) 40 MG tablet Take 40 mg by mouth 2 (two) times daily.     fenofibrate (TRICOR) 145 MG tablet Take 1 tablet (145 mg total) by mouth daily. 90 tablet 1   fluconazole (DIFLUCAN) 150 MG tablet Take 1 tablet (150 mg total) by mouth daily. 1 tablet 0   fluticasone (FLONASE) 50 MCG/ACT nasal spray Place 2 sprays into both nostrils 2 (two) times daily.     glucose blood (FREESTYLE PRECISION NEO TEST) test strip Use as instructed to check blood sugar 4X daily 400 each 3   guaiFENesin (MUCINEX) 600 MG 12 hr tablet Take 600 mg by mouth 2 (two) times daily.     hydrOXYzine (ATARAX) 25 MG tablet Take 25 mg by mouth daily as needed.     hyoscyamine (LEVBID) 0.375 MG 12 hr tablet Take 0.375 mg by mouth 2 (two) times daily.     Hyprom-Naphaz-Polysorb-Zn Sulf (CLEAR EYES COMPLETE OP) Place 1 drop into both eyes daily as needed.     ibuprofen (ADVIL) 200 MG tablet Take 800 mg by mouth every 12 (twelve) hours as needed for moderate pain.     insulin degludec (TRESIBA FLEXTOUCH) 200 UNIT/ML FlexTouch Pen Inject 68 Units into the skin daily. 30 mL 0   Insulin Glargine (BASAGLAR KWIKPEN) 100 UNIT/ML Inject 35-45 Units into the skin See admin instructions. Take 35 units in the morning and 45 units at night     insulin lispro (HUMALOG KWIKPEN) 200 UNIT/ML KwikPen Inject 25-35 Units into the skin 3 (three) times daily before meals. 30 mL 0   mupirocin cream (BACTROBAN) 2 % Apply 1 Application topically 2 (two) times daily. 30 g 0   nystatin (MYCOSTATIN/NYSTOP) powder Apply 1 Application topically 3 (three) times daily. 45 g 0   ondansetron (ZOFRAN) 4 MG tablet Take 1 tablet (4 mg total) by mouth every 8 (eight) hours as needed for nausea or vomiting. 20  tablet 0   pantoprazole (PROTONIX) 40 MG tablet Take 40 mg by mouth 2 (two) times daily.     polycarbophil (FIBERCON) 625 MG tablet Take 1,250 mg by mouth in the morning and at bedtime.     pregabalin (LYRICA) 150 MG capsule Take 150 mg by mouth daily.     promethazine (PHENERGAN) 12.5 MG tablet Take 12.5 mg by mouth every 6 (six) hours as needed for nausea or vomiting.     Ubrogepant (UBRELVY) 100 MG TABS Take 100 mg by mouth as needed (take 1 tablet at the earlist onset of Migraine. May repeat in 2 hours. max 2 tabs in 24 hours). (Patient taking differently: Take 100 mg by mouth  daily as needed (take 1 tablet at the earlist onset of Migraine. May repeat in 2 hours. max 2 tabs in 24 hours).) 16 tablet 5   Vitamin D, Ergocalciferol, (DRISDOL) 1.25 MG (50000 UNIT) CAPS capsule Take 1 capsule (50,000 Units total) by mouth every 7 (seven) days. (Patient taking differently: Take 50,000 Units by mouth every 7 (seven) days. Mondays) 12 capsule 0   No current facility-administered medications on file prior to visit.    ALLERGIES: Allergies  Allergen Reactions   Metformin And Related Other (See Comments)    GI upset   Metoclopramide Other (See Comments)    headache   Gabapentin Other (See Comments)    hallucination   Keflex [Cephalexin] Rash   Levemir [Insulin Detemir] Dermatitis    Whelps at injection site   Prednisone Palpitations    oral    FAMILY HISTORY: Family History  Problem Relation Age of Onset   Cancer Mother    Diabetes Father    Liver disease Father    Migraines Paternal Aunt    Heart attack Maternal Grandmother    Cancer Paternal Grandfather    Migraines Paternal Aunt       Objective:  *** General: No acute distress.  Patient appears ***-groomed.   Head:  Normocephalic/atraumatic Eyes:  Fundi examined but not visualized Neck: supple, no paraspinal tenderness, full range of motion Heart:  Regular rate and rhythm Lungs:  Clear to auscultation bilaterally Back: No  paraspinal tenderness Neurological Exam: alert and oriented to person, place, and time.  Speech fluent and not dysarthric, language intact.  CN II-XII intact. Bulk and tone normal, muscle strength 5/5 throughout.  Sensation to light touch intact.  Deep tendon reflexes 2+ throughout, toes downgoing.  Finger to nose testing intact.  Gait normal, Romberg negative.   Metta Clines, DO  CC: ***

## 2022-05-13 ENCOUNTER — Encounter: Payer: Self-pay | Admitting: Neurology

## 2022-05-13 ENCOUNTER — Ambulatory Visit: Payer: Commercial Managed Care - HMO | Admitting: Neurology

## 2022-05-16 ENCOUNTER — Ambulatory Visit: Payer: Commercial Managed Care - HMO

## 2022-05-16 ENCOUNTER — Other Ambulatory Visit: Payer: Self-pay | Admitting: Internal Medicine

## 2022-05-17 ENCOUNTER — Encounter: Payer: Self-pay | Admitting: Nurse Practitioner

## 2022-05-17 NOTE — Telephone Encounter (Signed)
Please advise on refill request   Allergies  Allergen Reactions   Metformin And Related Other (See Comments)    GI upset   Metoclopramide Other (See Comments)    headache   Gabapentin Other (See Comments)    hallucination   Keflex [Cephalexin] Rash   Levemir [Insulin Detemir] Dermatitis    Whelps at injection site   Prednisone Palpitations    oral     Current Outpatient Medications:    Rimegepant Sulfate (NURTEC) 75 MG TBDP, Take 75 mg by mouth daily as needed., Disp: 8 tablet, Rfl: 11   albuterol (VENTOLIN HFA) 108 (90 Base) MCG/ACT inhaler, INHALE 2 PUFFS BY MOUTH EVERY 6 HOURS AS NEEDED FOR WHEEZING FOR SHORTNESS OF BREATH, Disp: 9 g, Rfl: 0   benzonatate (TESSALON) 200 MG capsule, Take 1 capsule (200 mg total) by mouth every 8 (eight) hours as needed for cough., Disp: 50 capsule, Rfl: 2   botulinum toxin Type A (BOTOX) 200 units injection, Inject 200 Units into the muscle every 3 (three) months. Inject 155 units IM into multiple site in the face,neck and head once every 90 days., Disp: 1 each, Rfl: 4   Budeson-Glycopyrrol-Formoterol (BREZTRI AEROSPHERE) 160-9-4.8 MCG/ACT AERO, Inhale 2 puffs into the lungs in the morning and at bedtime., Disp: 5.9 g, Rfl: 0   buPROPion (WELLBUTRIN SR) 150 MG 12 hr tablet, Take 1 tablet (150 mg total) by mouth 2 (two) times daily., Disp: 60 tablet, Rfl: 3   Continuous Blood Gluc Receiver (FREESTYLE LIBRE 2 READER) DEVI, 1 each by Does not apply route daily., Disp: 1 each, Rfl: 0   Continuous Blood Gluc Sensor (FREESTYLE LIBRE 2 SENSOR) MISC, 1 each by Does not apply route every 14 (fourteen) days., Disp: 6 each, Rfl: 3   cyclobenzaprine (FLEXERIL) 10 MG tablet, Take 10 mg by mouth 2 (two) times daily as needed for muscle spasms., Disp: , Rfl:    diphenoxylate-atropine (LOMOTIL) 2.5-0.025 MG tablet, Take 1 tablet by mouth 4 (four) times daily as needed for diarrhea or loose stools., Disp: , Rfl:    doxycycline (VIBRA-TABS) 100 MG tablet, Take 1 tablet  (100 mg total) by mouth 2 (two) times daily., Disp: 20 tablet, Rfl: 0   dronabinol (MARINOL) 2.5 MG capsule, Take 2.5 mg by mouth 2 (two) times daily before a meal., Disp: , Rfl:    empagliflozin (JARDIANCE) 10 MG TABS tablet, Take 1 tablet (10 mg total) by mouth daily before breakfast., Disp: 90 tablet, Rfl: 3   escitalopram (LEXAPRO) 10 MG tablet, Take 10 mg by mouth at bedtime., Disp: , Rfl:    ezetimibe (ZETIA) 10 MG tablet, Take 1 tablet (10 mg total) by mouth daily., Disp: 90 tablet, Rfl: 3   famotidine (PEPCID) 40 MG tablet, Take 40 mg by mouth 2 (two) times daily., Disp: , Rfl:    fenofibrate (TRICOR) 145 MG tablet, Take 1 tablet (145 mg total) by mouth daily., Disp: 90 tablet, Rfl: 1   fluconazole (DIFLUCAN) 150 MG tablet, Take 1 tablet (150 mg total) by mouth daily., Disp: 1 tablet, Rfl: 0   fluticasone (FLONASE) 50 MCG/ACT nasal spray, Place 2 sprays into both nostrils 2 (two) times daily., Disp: , Rfl:    glucose blood (FREESTYLE PRECISION NEO TEST) test strip, Use as instructed to check blood sugar 4X daily, Disp: 400 each, Rfl: 3   guaiFENesin (MUCINEX) 600 MG 12 hr tablet, Take 600 mg by mouth 2 (two) times daily., Disp: , Rfl:    hydrOXYzine (ATARAX) 25  MG tablet, Take 25 mg by mouth daily as needed., Disp: , Rfl:    hyoscyamine (LEVBID) 0.375 MG 12 hr tablet, Take 0.375 mg by mouth 2 (two) times daily., Disp: , Rfl:    Hyprom-Naphaz-Polysorb-Zn Sulf (CLEAR EYES COMPLETE OP), Place 1 drop into both eyes daily as needed., Disp: , Rfl:    ibuprofen (ADVIL) 200 MG tablet, Take 800 mg by mouth every 12 (twelve) hours as needed for moderate pain., Disp: , Rfl:    insulin degludec (TRESIBA FLEXTOUCH) 200 UNIT/ML FlexTouch Pen, Inject 68 Units into the skin daily., Disp: 30 mL, Rfl: 0   Insulin Glargine (BASAGLAR KWIKPEN) 100 UNIT/ML, Inject 35-45 Units into the skin See admin instructions. Take 35 units in the morning and 45 units at night, Disp: , Rfl:    insulin lispro (HUMALOG KWIKPEN)  200 UNIT/ML KwikPen, Inject 25-35 Units into the skin 3 (three) times daily before meals., Disp: 30 mL, Rfl: 0   mupirocin cream (BACTROBAN) 2 %, Apply 1 Application topically 2 (two) times daily., Disp: 30 g, Rfl: 0   nystatin (MYCOSTATIN/NYSTOP) powder, Apply 1 Application topically 3 (three) times daily., Disp: 45 g, Rfl: 0   ondansetron (ZOFRAN) 4 MG tablet, Take 1 tablet (4 mg total) by mouth every 8 (eight) hours as needed for nausea or vomiting., Disp: 20 tablet, Rfl: 0   pantoprazole (PROTONIX) 40 MG tablet, Take 40 mg by mouth 2 (two) times daily., Disp: , Rfl:    polycarbophil (FIBERCON) 625 MG tablet, Take 1,250 mg by mouth in the morning and at bedtime., Disp: , Rfl:    pregabalin (LYRICA) 150 MG capsule, Take 150 mg by mouth daily., Disp: , Rfl:    promethazine (PHENERGAN) 12.5 MG tablet, Take 12.5 mg by mouth every 6 (six) hours as needed for nausea or vomiting., Disp: , Rfl:    Ubrogepant (UBRELVY) 100 MG TABS, Take 100 mg by mouth as needed (take 1 tablet at the earlist onset of Migraine. May repeat in 2 hours. max 2 tabs in 24 hours). (Patient taking differently: Take 100 mg by mouth daily as needed (take 1 tablet at the earlist onset of Migraine. May repeat in 2 hours. max 2 tabs in 24 hours).), Disp: 16 tablet, Rfl: 5   Vitamin D, Ergocalciferol, (DRISDOL) 1.25 MG (50000 UNIT) CAPS capsule, Take 1 capsule (50,000 Units total) by mouth every 7 (seven) days. (Patient taking differently: Take 50,000 Units by mouth every 7 (seven) days. Mondays), Disp: 12 capsule, Rfl: 0

## 2022-05-17 NOTE — Telephone Encounter (Signed)
Bupropion refiled

## 2022-06-07 ENCOUNTER — Encounter: Payer: Self-pay | Admitting: Nurse Practitioner

## 2022-06-24 ENCOUNTER — Ambulatory Visit: Payer: Commercial Managed Care - HMO | Admitting: Neurology

## 2022-06-24 DIAGNOSIS — G43709 Chronic migraine without aura, not intractable, without status migrainosus: Secondary | ICD-10-CM

## 2022-06-24 NOTE — Progress Notes (Signed)
[facility]  ° °Procedure Note Botox ° °Attending: Dr. Kiarah Eckstein ° °Preoperative Diagnosis(es): Chronic migraine ° °Consent obtained from: The patient °Benefits discussed included, but were not limited to decreased muscle tightness, increased joint range of motion, and decreased pain.  Risk discussed included, but were not limited pain and discomfort, bleeding, bruising, excessive weakness, venous thrombosis, muscle atrophy and dysphagia.  Anticipated outcomes of the procedure as well as he risks and benefits of the alternatives to the procedure, and the roles and tasks of the personnel to be involved, were discussed with the patient, and the patient consents to the procedure and agrees to proceed. A copy of the patient medication guide was given to the patient which explains the blackbox warning. ° °Patients identity and treatment sites confirmed Yes.  . ° °Details of Procedure: °Skin was cleaned with alcohol. Prior to injection, the needle plunger was aspirated to make sure the needle was not within a blood vessel.  There was no blood retrieved on aspiration.   ° °Following is a summary of the muscles injected  And the amount of Botulinum toxin used: ° °Dilution °200 units of Botox was reconstituted with 4 ml of preservative free normal saline. °Time of reconstitution: At the time of the office visit (<30 minutes prior to injection)  ° °Injections  °155 total units of Botox was injected with a 30 gauge needle. ° °Injection Sites: °L occipitalis: 15 units- 3 sites  °R occiptalis: 15 units- 3 sites ° °L upper trapezius: 15 units- 3 sites °R upper trapezius: 15 units- 3 sits          °L paraspinal: 10 units- 2 sites °R paraspinal: 10 units- 2 sites ° °Face °L frontalis(2 injection sites):10 units   °R frontalis(2 injection sites):10 units         °L corrugator: 5 units   °R corrugator: 5 units           °Procerus: 5 units   °L temporalis: 20 units °R temporalis: 20 units  ° °Agent:  °200 units of botulinum Type  A (Onobotulinum Toxin type A) was reconstituted with 4 ml of preservative free normal saline.  °Time of reconstitution: At the time of the office visit (<30 minutes prior to injection)  ° ° ° Total injected (Units):  155 ° Total wasted (Units):  45 ° °Patient tolerated procedure well without complications.   °Reinjection is anticipated in 3 months. ° ° °

## 2022-06-27 ENCOUNTER — Ambulatory Visit: Payer: Commercial Managed Care - HMO

## 2022-07-13 ENCOUNTER — Encounter: Payer: Self-pay | Admitting: Nurse Practitioner

## 2022-07-13 ENCOUNTER — Ambulatory Visit (HOSPITAL_BASED_OUTPATIENT_CLINIC_OR_DEPARTMENT_OTHER)
Admission: RE | Admit: 2022-07-13 | Discharge: 2022-07-13 | Disposition: A | Discharge status: Home / Self Care | Payer: Commercial Managed Care - HMO | Source: Ambulatory Visit | Attending: Nurse Practitioner | Admitting: Nurse Practitioner

## 2022-07-13 ENCOUNTER — Ambulatory Visit (INDEPENDENT_AMBULATORY_CARE_PROVIDER_SITE_OTHER): Payer: Commercial Managed Care - HMO | Admitting: Nurse Practitioner

## 2022-07-13 ENCOUNTER — Ambulatory Visit (HOSPITAL_BASED_OUTPATIENT_CLINIC_OR_DEPARTMENT_OTHER): Payer: Commercial Managed Care - HMO

## 2022-07-13 VITALS — BP 110/78 | HR 98 | Resp 16 | Ht 62.0 in | Wt 273.0 lb

## 2022-07-13 DIAGNOSIS — M79605 Pain in left leg: Secondary | ICD-10-CM | POA: Insufficient documentation

## 2022-07-13 NOTE — Progress Notes (Signed)
Established Patient Visit  Patient: Kelsey Vang   DOB: 1975-11-17   47 y.o. Female  MRN: [id number] Visit Date: 07/18/2022  Subjective:    Chief Complaint  Patient presents with   Leg Pain    Left lower extremity pain for a week   Accompanied by husband Leg Pain  The incident occurred more than 1 week ago. There was no injury mechanism. The pain is present in the left leg. The quality of the pain is described as aching (tightness). The pain is severe. The pain has been Constant since onset. Associated symptoms include numbness. Pertinent negatives include no inability to bear weight, loss of motion, loss of sensation, muscle weakness or tingling. She reports no foreign bodies present. Nothing aggravates the symptoms. She has tried nothing for the symptoms.   Reviewed medical, surgical, and social history today  Medications: Outpatient Medications Prior to Visit  Medication Sig   albuterol (VENTOLIN HFA) 108 (90 Base) MCG/ACT inhaler INHALE 2 PUFFS BY MOUTH EVERY 6 HOURS AS NEEDED FOR WHEEZING FOR SHORTNESS OF BREATH   benzonatate (TESSALON) 200 MG capsule Take 1 capsule (200 mg total) by mouth every 8 (eight) hours as needed for cough.   botulinum toxin Type A (BOTOX) 200 units injection Inject 200 Units into the muscle every 3 (three) months. Inject 155 units IM into multiple site in the face,neck and head once every 90 days.   Budeson-Glycopyrrol-Formoterol (BREZTRI AEROSPHERE) 160-9-4.8 MCG/ACT AERO Inhale 2 puffs into the lungs in the morning and at bedtime.   buPROPion (WELLBUTRIN SR) 150 MG 12 hr tablet Take 1 tablet by mouth twice daily   Continuous Blood Gluc Receiver (FREESTYLE LIBRE 2 READER) DEVI 1 each by Does not apply route daily.   Continuous Blood Gluc Sensor (FREESTYLE LIBRE 2 SENSOR) MISC 1 each by Does not apply route every 14 (fourteen) days.   cyclobenzaprine (FLEXERIL) 10 MG tablet Take 10 mg by mouth 2 (two) times daily as needed for muscle spasms.    diphenoxylate-atropine (LOMOTIL) 2.5-0.025 MG tablet Take 1 tablet by mouth 4 (four) times daily as needed for diarrhea or loose stools.   famotidine (PEPCID) 40 MG tablet Take 40 mg by mouth 2 (two) times daily.   fenofibrate (TRICOR) 145 MG tablet Take 1 tablet (145 mg total) by mouth daily.   fluticasone (FLONASE) 50 MCG/ACT nasal spray Place 2 sprays into both nostrils 2 (two) times daily.   glucose blood (FREESTYLE PRECISION NEO TEST) test strip Use as instructed to check blood sugar 4X daily   hydrOXYzine (ATARAX) 25 MG tablet Take 25 mg by mouth daily as needed.   hyoscyamine (LEVBID) 0.375 MG 12 hr tablet Take 0.375 mg by mouth 2 (two) times daily.   Hyprom-Naphaz-Polysorb-Zn Sulf (CLEAR EYES COMPLETE OP) Place 1 drop into both eyes daily as needed.   ibuprofen (ADVIL) 200 MG tablet Take 800 mg by mouth every 12 (twelve) hours as needed for moderate pain.   insulin degludec (TRESIBA FLEXTOUCH) 200 UNIT/ML FlexTouch Pen Inject 68 Units into the skin daily.   insulin lispro (HUMALOG KWIKPEN) 200 UNIT/ML KwikPen Inject 25-35 Units into the skin 3 (three) times daily before meals.   mupirocin cream (BACTROBAN) 2 % Apply 1 Application topically 2 (two) times daily.   nystatin (MYCOSTATIN/NYSTOP) powder Apply 1 Application topically 3 (three) times daily.   ondansetron (ZOFRAN) 4 MG tablet Take 1 tablet (4 mg total) by mouth every  8 (eight) hours as needed for nausea or vomiting.   pantoprazole (PROTONIX) 40 MG tablet Take 40 mg by mouth 2 (two) times daily.   polycarbophil (FIBERCON) 625 MG tablet Take 1,250 mg by mouth in the morning and at bedtime.   promethazine (PHENERGAN) 12.5 MG tablet Take 12.5 mg by mouth every 6 (six) hours as needed for nausea or vomiting.   Rimegepant Sulfate (NURTEC) 75 MG TBDP Take 75 mg by mouth daily as needed.   [DISCONTINUED] doxycycline (VIBRA-TABS) 100 MG tablet Take 1 tablet (100 mg total) by mouth 2 (two) times daily.   [DISCONTINUED] dronabinol  (MARINOL) 2.5 MG capsule Take 2.5 mg by mouth 2 (two) times daily before a meal.   [DISCONTINUED] empagliflozin (JARDIANCE) 10 MG TABS tablet Take 1 tablet (10 mg total) by mouth daily before breakfast.   [DISCONTINUED] escitalopram (LEXAPRO) 10 MG tablet Take 10 mg by mouth at bedtime.   [DISCONTINUED] ezetimibe (ZETIA) 10 MG tablet Take 1 tablet (10 mg total) by mouth daily.   [DISCONTINUED] fluconazole (DIFLUCAN) 150 MG tablet Take 1 tablet (150 mg total) by mouth daily.   [DISCONTINUED] guaiFENesin (MUCINEX) 600 MG 12 hr tablet Take 600 mg by mouth 2 (two) times daily. (Patient not taking: Reported on 07/13/2022)   [DISCONTINUED] Insulin Glargine (BASAGLAR KWIKPEN) 100 UNIT/ML Inject 35-45 Units into the skin See admin instructions. Take 35 units in the morning and 45 units at night   [DISCONTINUED] pregabalin (LYRICA) 150 MG capsule Take 150 mg by mouth daily.   [DISCONTINUED] Ubrogepant (UBRELVY) 100 MG TABS Take 100 mg by mouth as needed (take 1 tablet at the earlist onset of Migraine. May repeat in 2 hours. max 2 tabs in 24 hours). (Patient taking differently: Take 100 mg by mouth daily as needed (take 1 tablet at the earlist onset of Migraine. May repeat in 2 hours. max 2 tabs in 24 hours).)   [DISCONTINUED] Vitamin D, Ergocalciferol, (DRISDOL) 1.25 MG (50000 UNIT) CAPS capsule Take 1 capsule (50,000 Units total) by mouth every 7 (seven) days. (Patient taking differently: Take 50,000 Units by mouth every 7 (seven) days. Mondays)   No facility-administered medications prior to visit.   Reviewed past medical and social history.   ROS per HPI above      Objective:  BP 110/78 (BP Location: Left Arm, Patient Position: Sitting, Cuff Size: Large)   Pulse 98   Resp 16   Ht 5\' 2"  (1.575 m)   Wt 273 lb (123.8 kg)   LMP 11/01/2017 (Approximate)   SpO2 98%   BMI 49.93 kg/m      Physical Exam Vitals reviewed.  Cardiovascular:     Pulses:          Popliteal pulses are 2+ on the right  side and 2+ on the left side.       Dorsalis pedis pulses are 2+ on the right side and 2+ on the left side.       Posterior tibial pulses are 2+ on the right side and 2+ on the left side.  Musculoskeletal:        General: Swelling and tenderness present.     Left lower leg: Edema present.  Skin:    Findings: No erythema.  Neurological:     Mental Status: She is alert and oriented to person, place, and time.     No results found for any visits on 07/13/22.    Assessment & Plan:    Problem List Items Addressed This Visit  None Visit Diagnoses     Leg pain, posterior, left    -  Primary   Relevant Orders   US Venous Img Lower Unilateral Left (DVT) (Completed)     Negative DVT. Advised to Wear compression stocking during the day and off at night. Use tylenol/ibuprofen prn for pain.  Return if symptoms worsen or fail to improve.     Wilfred Lacy, NP

## 2022-07-13 NOTE — Patient Instructions (Addendum)
Go for venous doppler as schedule Wear compression stocking during the day and off at night

## 2022-07-14 ENCOUNTER — Other Ambulatory Visit: Payer: Commercial Managed Care - HMO

## 2022-07-18 ENCOUNTER — Telehealth: Payer: Self-pay | Admitting: Nurse Practitioner

## 2022-07-18 ENCOUNTER — Encounter: Payer: Self-pay | Admitting: Nurse Practitioner

## 2022-07-18 DIAGNOSIS — M79672 Pain in left foot: Secondary | ICD-10-CM

## 2022-07-18 NOTE — Telephone Encounter (Signed)
She is still having pain a the top of her feet  which is keeping her up at night-  She has an appointment 4/5 with you.

## 2022-07-19 ENCOUNTER — Emergency Department (HOSPITAL_BASED_OUTPATIENT_CLINIC_OR_DEPARTMENT_OTHER): Payer: Medicaid Other

## 2022-07-19 ENCOUNTER — Encounter (HOSPITAL_BASED_OUTPATIENT_CLINIC_OR_DEPARTMENT_OTHER): Payer: Self-pay

## 2022-07-19 ENCOUNTER — Observation Stay (HOSPITAL_BASED_OUTPATIENT_CLINIC_OR_DEPARTMENT_OTHER)
Admission: EM | Admit: 2022-07-19 | Discharge: 2022-07-20 | Discharge status: Against Medical Advice | Payer: Medicaid Other | Attending: Internal Medicine | Admitting: Internal Medicine

## 2022-07-19 ENCOUNTER — Other Ambulatory Visit: Payer: Self-pay

## 2022-07-19 DIAGNOSIS — R6510 Systemic inflammatory response syndrome (SIRS) of non-infectious origin without acute organ dysfunction: Principal | ICD-10-CM | POA: Insufficient documentation

## 2022-07-19 DIAGNOSIS — F17210 Nicotine dependence, cigarettes, uncomplicated: Secondary | ICD-10-CM | POA: Diagnosis not present

## 2022-07-19 DIAGNOSIS — R69 Illness, unspecified: Secondary | ICD-10-CM

## 2022-07-19 DIAGNOSIS — Z79899 Other long term (current) drug therapy: Secondary | ICD-10-CM | POA: Diagnosis not present

## 2022-07-19 DIAGNOSIS — J45909 Unspecified asthma, uncomplicated: Secondary | ICD-10-CM | POA: Insufficient documentation

## 2022-07-19 DIAGNOSIS — L03317 Cellulitis of buttock: Secondary | ICD-10-CM | POA: Diagnosis not present

## 2022-07-19 DIAGNOSIS — R0602 Shortness of breath: Secondary | ICD-10-CM | POA: Insufficient documentation

## 2022-07-19 DIAGNOSIS — Z794 Long term (current) use of insulin: Secondary | ICD-10-CM | POA: Insufficient documentation

## 2022-07-19 DIAGNOSIS — L0231 Cutaneous abscess of buttock: Secondary | ICD-10-CM | POA: Diagnosis present

## 2022-07-19 DIAGNOSIS — I10 Essential (primary) hypertension: Secondary | ICD-10-CM | POA: Insufficient documentation

## 2022-07-19 DIAGNOSIS — Z6841 Body Mass Index (BMI) 40.0 and over, adult: Secondary | ICD-10-CM | POA: Diagnosis not present

## 2022-07-19 DIAGNOSIS — E1165 Type 2 diabetes mellitus with hyperglycemia: Secondary | ICD-10-CM | POA: Diagnosis not present

## 2022-07-19 DIAGNOSIS — L03314 Cellulitis of groin: Secondary | ICD-10-CM

## 2022-07-19 DIAGNOSIS — L0291 Cutaneous abscess, unspecified: Secondary | ICD-10-CM

## 2022-07-19 LAB — CBC WITH DIFFERENTIAL/PLATELET
Abs Immature Granulocytes: 0.06 10*3/uL (ref 0.00–0.07)
Basophils Absolute: 0.1 10*3/uL (ref 0.0–0.1)
Basophils Relative: 1 %
Eosinophils Absolute: 0.4 10*3/uL (ref 0.0–0.5)
Eosinophils Relative: 2 %
HCT: 44.3 % (ref 36.0–46.0)
Hemoglobin: 14.8 g/dL (ref 12.0–15.0)
Immature Granulocytes: 0 %
Lymphocytes Relative: 19 %
Lymphs Abs: 3 10*3/uL (ref 0.7–4.0)
MCH: 30.4 pg (ref 26.0–34.0)
MCHC: 33.4 g/dL (ref 30.0–36.0)
MCV: 91 fL (ref 80.0–100.0)
Monocytes Absolute: 0.9 10*3/uL (ref 0.1–1.0)
Monocytes Relative: 6 %
Neutro Abs: 10.9 10*3/uL — ABNORMAL HIGH (ref 1.7–7.7)
Neutrophils Relative %: 72 %
Platelets: 302 10*3/uL (ref 150–400)
RBC: 4.87 MIL/uL (ref 3.87–5.11)
RDW: 13.3 % (ref 11.5–15.5)
WBC: 15.3 10*3/uL — ABNORMAL HIGH (ref 4.0–10.5)
nRBC: 0 % (ref 0.0–0.2)

## 2022-07-19 LAB — COMPREHENSIVE METABOLIC PANEL
ALT: 29 U/L (ref 0–44)
AST: 22 U/L (ref 15–41)
Albumin: 3.6 g/dL (ref 3.5–5.0)
Alkaline Phosphatase: 134 U/L — ABNORMAL HIGH (ref 38–126)
Anion gap: 9 (ref 5–15)
BUN: 11 mg/dL (ref 6–20)
CO2: 26 mmol/L (ref 22–32)
Calcium: 9.4 mg/dL (ref 8.9–10.3)
Chloride: 98 mmol/L (ref 98–111)
Creatinine, Ser: 0.78 mg/dL (ref 0.44–1.00)
GFR, Estimated: >60 mL/min (ref >60)
Glucose, Bld: 363 mg/dL — ABNORMAL HIGH (ref 70–99)
Potassium: 4 mmol/L (ref 3.5–5.1)
Sodium: 133 mmol/L — ABNORMAL LOW (ref 135–145)
Total Bilirubin: 0.8 mg/dL (ref 0.3–1.2)
Total Protein: 7.7 g/dL (ref 6.5–8.1)

## 2022-07-19 LAB — LACTIC ACID, PLASMA: Lactic Acid, Venous: 1.4 mmol/L (ref 0.5–1.9)

## 2022-07-19 MED ORDER — ONDANSETRON HCL 4 MG/2ML IJ SOLN
4.0000 mg | INTRAMUSCULAR | Status: DC | PRN
  Administered 2022-07-19: 4 mg via INTRAVENOUS
  Filled 2022-07-19: qty 2

## 2022-07-19 MED ORDER — PIPERACILLIN-TAZOBACTAM 3.375 G IVPB
3.3750 g | Freq: Three times a day (TID) | INTRAVENOUS | Status: DC
  Administered 2022-07-20 (×2): 3.375 g via INTRAVENOUS
  Filled 2022-07-19 (×2): qty 50

## 2022-07-19 MED ORDER — VANCOMYCIN HCL IN DEXTROSE 1-5 GM/200ML-% IV SOLN
1000.0000 mg | Freq: Once | INTRAVENOUS | Status: AC
  Administered 2022-07-19: 1000 mg via INTRAVENOUS
  Filled 2022-07-19: qty 200

## 2022-07-19 MED ORDER — PIPERACILLIN-TAZOBACTAM 4.5 G IVPB: 4.5000 g | Freq: Four times a day (QID) | INTRAVENOUS | Status: DC

## 2022-07-19 MED ORDER — PIPERACILLIN-TAZOBACTAM 3.375 G IVPB 30 MIN
3.3750 g | Freq: Once | INTRAVENOUS | Status: AC
  Administered 2022-07-20: 3.375 g via INTRAVENOUS

## 2022-07-19 MED ORDER — LACTATED RINGERS IV SOLN: INTRAVENOUS | Status: DC

## 2022-07-19 MED ORDER — PIPERACILLIN-TAZOBACTAM 3.375 G IVPB
3.3750 g | Freq: Once | INTRAVENOUS | Status: DC
  Filled 2022-07-19: qty 50

## 2022-07-19 MED ORDER — LACTATED RINGERS IV BOLUS
1000.0000 mL | Freq: Once | INTRAVENOUS | Status: AC
  Administered 2022-07-20: 1000 mL via INTRAVENOUS

## 2022-07-19 MED ORDER — ACETAMINOPHEN 500 MG PO TABS
1000.0000 mg | ORAL_TABLET | Freq: Once | ORAL | Status: AC
  Administered 2022-07-19: 1000 mg via ORAL
  Filled 2022-07-19: qty 2

## 2022-07-19 MED ORDER — LACTATED RINGERS IV BOLUS (SEPSIS)
1000.0000 mL | Freq: Once | INTRAVENOUS | Status: AC
  Administered 2022-07-20: 1000 mL via INTRAVENOUS

## 2022-07-19 NOTE — ED Triage Notes (Signed)
Pt c/o intermittent fever, SHOB, lightheaded. States she's had an abscess on L leg, unsure if related. States blood sugars have been "in the 600's, high off & on," hx T2DM, no steroid use.  Doxy x10 days- finish tomorrow, 3rd abscess since starting abx.

## 2022-07-19 NOTE — ED Notes (Signed)
Apologized to pt/spouse re:  wait times and that a EDP should be in as soon as possible Water given to pt

## 2022-07-19 NOTE — ED Provider Notes (Signed)
[facility] Provider Note   CSN: [id number] Arrival date & time: 07/19/22  1833     History  Chief Complaint  Patient presents with   Abscess    L leg   Shortness of Breath    Kelsey Vang is a 47 y.o. female.  HPI Patient reports she has been getting recurrent abscesses.  She denies a diagnosis of hidradenitis but has gotten abscesses on her abdomen and legs previously.  She is just finishing a course of doxycycline 9/10 days.  She reports she now has a very large tender swelling at the crease of her leg and her groin area on the left.  She reports typically small abscesses will open and drain but this has just gotten bigger and more painful.  She reports she has had a fever at home.  She is also has some general lightheadedness.  Intermittently she feels short of breath and nauseated.  She reports blood sugars have also been getting very elevated sometimes up into the 600s.    Home Medications Prior to Admission medications   Medication Sig Start Date End Date Taking? Authorizing Provider  albuterol (VENTOLIN HFA) 108 (90 Base) MCG/ACT inhaler INHALE 2 PUFFS BY MOUTH EVERY 6 HOURS AS NEEDED FOR WHEEZING FOR SHORTNESS OF BREATH 11/09/21   Nche, Charlene Brooke, NP  benzonatate (TESSALON) 200 MG capsule Take 1 capsule (200 mg total) by mouth every 8 (eight) hours as needed for cough. 11/02/21   Baird Lyons D, MD  botulinum toxin Type A (BOTOX) 200 units injection Inject 200 Units into the muscle every 3 (three) months. Inject 155 units IM into multiple site in the face,neck and head once every 90 days. 11/16/21   Pieter Partridge, DO  Budeson-Glycopyrrol-Formoterol (BREZTRI AEROSPHERE) 160-9-4.8 MCG/ACT AERO Inhale 2 puffs into the lungs in the morning and at bedtime. 01/07/22   Deneise Lever, MD  buPROPion (WELLBUTRIN SR) 150 MG 12 hr tablet Take 1 tablet by mouth twice daily 05/17/22   Deneise Lever, MD  Continuous Blood Gluc Receiver  (FREESTYLE LIBRE 2 READER) DEVI 1 each by Does not apply route daily. 11/08/21   Philemon Kingdom, MD  Continuous Blood Gluc Sensor (FREESTYLE LIBRE 2 SENSOR) MISC 1 each by Does not apply route every 14 (fourteen) days. 11/08/21   Philemon Kingdom, MD  cyclobenzaprine (FLEXERIL) 10 MG tablet Take 10 mg by mouth 2 (two) times daily as needed for muscle spasms. 06/12/20   [provider]  diphenoxylate-atropine (LOMOTIL) 2.5-0.025 MG tablet Take 1 tablet by mouth 4 (four) times daily as needed for diarrhea or loose stools.    [provider]  famotidine (PEPCID) 40 MG tablet Take 40 mg by mouth 2 (two) times daily.    [provider]  fenofibrate (TRICOR) 145 MG tablet Take 1 tablet (145 mg total) by mouth daily. 04/28/22   Nche, Charlene Brooke, NP  fluticasone (FLONASE) 50 MCG/ACT nasal spray Place 2 sprays into both nostrils 2 (two) times daily. 07/18/21   [provider]  glucose blood (FREESTYLE PRECISION NEO TEST) test strip Use as instructed to check blood sugar 4X daily 06/10/21   Philemon Kingdom, MD  hydrOXYzine (ATARAX) 25 MG tablet Take 25 mg by mouth daily as needed. 05/27/21   [provider]  hyoscyamine (LEVBID) 0.375 MG 12 hr tablet Take 0.375 mg by mouth 2 (two) times daily.    [provider]  Hyprom-Naphaz-Polysorb-Zn Sulf (CLEAR EYES COMPLETE OP) Place  1 drop into both eyes daily as needed.    [provider]  ibuprofen (ADVIL) 200 MG tablet Take 800 mg by mouth every 12 (twelve) hours as needed for moderate pain.    [provider]  insulin degludec (TRESIBA FLEXTOUCH) 200 UNIT/ML FlexTouch Pen Inject 68 Units into the skin daily. 03/08/22   Philemon Kingdom, MD  insulin lispro (HUMALOG KWIKPEN) 200 UNIT/ML KwikPen Inject 25-35 Units into the skin 3 (three) times daily before meals. 03/08/22   Philemon Kingdom, MD  mupirocin cream (BACTROBAN) 2 % Apply 1 Application topically 2 (two) times daily. 01/21/22   Nche,  Charlene Brooke, NP  nystatin (MYCOSTATIN/NYSTOP) powder Apply 1 Application topically 3 (three) times daily. 01/21/22   Nche, Charlene Brooke, NP  ondansetron (ZOFRAN) 4 MG tablet Take 1 tablet (4 mg total) by mouth every 8 (eight) hours as needed for nausea or vomiting. 01/18/19   Wendall Mola, NP  pantoprazole (PROTONIX) 40 MG tablet Take 40 mg by mouth 2 (two) times daily. 12/17/20   [provider]  polycarbophil (FIBERCON) 625 MG tablet Take 1,250 mg by mouth in the morning and at bedtime.    [provider]  promethazine (PHENERGAN) 12.5 MG tablet Take 12.5 mg by mouth every 6 (six) hours as needed for nausea or vomiting.    [provider]  Rimegepant Sulfate (NURTEC) 75 MG TBDP Take 75 mg by mouth daily as needed. 04/07/22   Pieter Partridge, DO      Allergies    Metformin and related, Metoclopramide, Gabapentin, Keflex [cephalexin], Levemir [insulin detemir], and Prednisone    Review of Systems   Review of Systems  Physical Exam Updated Vital Signs BP 118/64   Pulse (!) 101   Temp 99.7 F (37.6 C)   Resp (!) 22   Ht 5\' 1"  (1.549 m)   Wt 123.8 kg   LMP 11/01/2017 (Approximate)   SpO2 99%   BMI 51.58 kg/m  Physical Exam Constitutional:      Comments: Patient is alert.  Mental status clear.  No respiratory distress at rest.  HENT:     Mouth/Throat:     Pharynx: Oropharynx is clear.  Eyes:     Extraocular Movements: Extraocular movements intact.  Cardiovascular:     Comments: Borderline tachycardia at 100.  No appreciable rub murmur gallop. Pulmonary:     Effort: Pulmonary effort is normal.     Breath sounds: Normal breath sounds.  Abdominal:     General: There is no distension.     Palpations: Abdomen is soft.     Tenderness: There is no abdominal tenderness. There is no guarding.  Musculoskeletal:        General: Normal range of motion.  Skin:    General: Skin is warm and dry.     Comments: Patient has several areas of papular lesions  with small amount of drainage.  A couple on the lower abdomen and another on the medial thigh on the right.  They appear suspicious for hidradenitis.  None of these have any fluctuance with them and they are fairly small at this time.  Patient has a very large firm indurated tender mass in the groin fold of the high thigh on the left.  This is about a 8 cm or tangerine sized tender fullness.  Cannot completely appreciate if there is any fluctuance versus induration.  At this time, the vulvar tissues have some of these smaller lesions with small yellow soft eschar but not  any crepitus or appearance of cellulitis directly extending to the vulva at this time.  Neurological:     General: No focal deficit present.     Mental Status: She is oriented to person, place, and time.     Motor: No weakness.     Coordination: Coordination normal.     ED Results / Procedures / Treatments   Labs (all labs ordered are listed, but only abnormal results are displayed) Labs Reviewed  COMPREHENSIVE METABOLIC PANEL - Abnormal; Notable for the following components:      Result Value   Sodium 133 (*)    Glucose, Bld 363 (*)    Alkaline Phosphatase 134 (*)    All other components within normal limits  CBC WITH DIFFERENTIAL/PLATELET - Abnormal; Notable for the following components:   WBC 15.3 (*)    Neutro Abs 10.9 (*)    All other components within normal limits  CULTURE, BLOOD (ROUTINE X 2)  CULTURE, BLOOD (ROUTINE X 2)  LACTIC ACID, PLASMA  LACTIC ACID, PLASMA  URINALYSIS, W/ REFLEX TO CULTURE (INFECTION SUSPECTED)    EKG EKG Interpretation  Date/Time:  Tuesday July 19 2022 18:47:33 EDT Ventricular Rate:  107 PR Interval:  150 QRS Duration: 88 QT Interval:  324 QTC Calculation: 433 R Axis:   96 Text Interpretation: Sinus tachycardia Consider right atrial enlargement Low voltage with right axis deviation no sig change from previous. Confirmed by Charlesetta Shanks [phone]) on 07/19/2022 7:10:54  PM  Radiology DG Chest 2 View  Result Date: 07/19/2022 CLINICAL DATA:  Fever and shortness of breath. EXAM: CHEST - 2 VIEW COMPARISON:  Aug 27, 2021 FINDINGS: The heart size and mediastinal contours are within normal limits. Low lung volumes are noted. There is no evidence of acute infiltrate, pleural effusion or pneumothorax. Multilevel degenerative changes seen throughout the thoracic spine. IMPRESSION: No active cardiopulmonary disease. Electronically Signed   By: Virgina Norfolk M.D.   On: 07/19/2022 19:15    Procedures Procedures   CRITICAL CARE Performed by: Charlesetta Shanks   Total critical care time: 30 minutes  Critical care time was exclusive of separately billable procedures and treating other patients.  Critical care was necessary to treat or prevent imminent or life-threatening deterioration.  Critical care was time spent personally by me on the following activities: development of treatment plan with patient and/or surrogate as well as nursing, discussions with consultants, evaluation of patient's response to treatment, examination of patient, obtaining history from patient or surrogate, ordering and performing treatments and interventions, ordering and review of laboratory studies, ordering and review of radiographic studies, pulse oximetry and re-evaluation of patient's condition.  Medications Ordered in ED Medications  vancomycin (VANCOCIN) IVPB 1000 mg/200 mL premix (1,000 mg Intravenous New Bag/Given 07/19/22 2327)  lactated ringers bolus 1,000 mL (has no administration in time range)  ondansetron (ZOFRAN) injection 4 mg (4 mg Intravenous Given 07/19/22 2328)  lactated ringers infusion (has no administration in time range)  lactated ringers bolus 1,000 mL (has no administration in time range)  piperacillin-tazobactam (ZOSYN) IVPB 3.375 g (has no administration in time range)    Followed by  piperacillin-tazobactam (ZOSYN) IVPB 3.375 g (has no administration in time range)   acetaminophen (TYLENOL) tablet 1,000 mg (1,000 mg Oral Given 07/19/22 2328)    ED Course/ Medical Decision Making/ A&P                             Medical Decision Making Amount  and/or Complexity of Data Reviewed Labs: ordered. Radiology: ordered.  Risk OTC drugs. Prescription drug management. Decision regarding hospitalization.   Patient presents with worsening probable abscess.  Comorbid conditions include diabetes with at least recently poorly controlled blood sugar, antibiotic failure on day 9 of doxycycline for recurrent or persistent given abscesses.  At this time main concern is for either large deeper tissue abscess or risk for Fournier gangrene with a tender full mass at the leg crease just adjacent to the vulva.  Patient is also had constitutional symptoms.  She reports fever at home, malaise, periodic shortness of breath and nausea.  Vital signs include mild tachycardia riding at about 113 but currently about 101.  Patient is not hypotensive but initial temperature 99.7.  Concern for early sepsis.  White count 15,000, lactic 1.4, GFR normal, blood glucose 363.  Due to high risk for severe infection in the setting, will proceed with antibiotics with Zosyn and Flagyl.  Will also proceed with CT abdomen pelvis to try to define focal abscess versus cellulitic changes and if any extension or involvement of the perineum.  At this time, with patient having completed a course of doxycycline and now advanced abscess versus cellulitis and early sepsis criteria, will plan for admission.  Patient reports that if she is n.p.o. and feeling ill her gastroparesis will likely worsen.  Will continue to monitor closely and administer Zofran and IV fluids while continuing diagnostic evaluation.  Dr. Florina Ou to follow-up on CT results and continue disposition management.        Final Clinical Impression(s) / ED Diagnoses Final diagnoses:  Abscess  SIRS (systemic inflammatory response  syndrome)  Severe comorbid illness    Rx / DC Orders ED Discharge Orders     None         Charlesetta Shanks, MD 07/19/22 2338

## 2022-07-19 NOTE — ED Notes (Signed)
Per pt has 3 abscesses near vaginal area.   Has been on doxycycline x 10 days.

## 2022-07-20 ENCOUNTER — Encounter: Payer: Self-pay | Admitting: Nurse Practitioner

## 2022-07-20 ENCOUNTER — Emergency Department (HOSPITAL_BASED_OUTPATIENT_CLINIC_OR_DEPARTMENT_OTHER): Payer: Medicaid Other

## 2022-07-20 DIAGNOSIS — L03317 Cellulitis of buttock: Secondary | ICD-10-CM | POA: Diagnosis present

## 2022-07-20 LAB — CBC
HCT: 39.1 % (ref 36.0–46.0)
Hemoglobin: 13.2 g/dL (ref 12.0–15.0)
MCH: 30.6 pg (ref 26.0–34.0)
MCHC: 33.8 g/dL (ref 30.0–36.0)
MCV: 90.5 fL (ref 80.0–100.0)
Platelets: 245 10*3/uL (ref 150–400)
RBC: 4.32 MIL/uL (ref 3.87–5.11)
RDW: 13.2 % (ref 11.5–15.5)
WBC: 12.3 10*3/uL — ABNORMAL HIGH (ref 4.0–10.5)
nRBC: 0 % (ref 0.0–0.2)

## 2022-07-20 LAB — CREATININE, SERUM
Creatinine, Ser: 0.85 mg/dL (ref 0.44–1.00)
GFR, Estimated: >60 mL/min (ref >60)

## 2022-07-20 LAB — HEMOGLOBIN A1C
Hgb A1c MFr Bld: 11.8 % — ABNORMAL HIGH (ref 4.8–5.6)
Mean Plasma Glucose: 292 mg/dL

## 2022-07-20 LAB — URINALYSIS, W/ REFLEX TO CULTURE (INFECTION SUSPECTED)
Bilirubin Urine: NEGATIVE
Glucose, UA: >=500 mg/dL — AB
Hgb urine dipstick: NEGATIVE
Ketones, ur: NEGATIVE mg/dL
Leukocytes,Ua: NEGATIVE
Nitrite: POSITIVE — AB
Protein, ur: NEGATIVE mg/dL
Specific Gravity, Urine: >=1.03 (ref 1.005–1.030)
pH: 5.5 (ref 5.0–8.0)

## 2022-07-20 LAB — GLUCOSE, CAPILLARY
Glucose-Capillary: 375 mg/dL — ABNORMAL HIGH (ref 70–99)
Glucose-Capillary: 406 mg/dL — ABNORMAL HIGH (ref 70–99)
Glucose-Capillary: 303 mg/dL — ABNORMAL HIGH (ref 70–99)
Glucose-Capillary: 303 mg/dL — ABNORMAL HIGH (ref 70–99)
Glucose-Capillary: 272 mg/dL — ABNORMAL HIGH (ref 70–99)
Glucose-Capillary: 234 mg/dL — ABNORMAL HIGH (ref 70–99)

## 2022-07-20 LAB — HIV ANTIBODY (ROUTINE TESTING W REFLEX): HIV Screen 4th Generation wRfx: NONREACTIVE

## 2022-07-20 MED ORDER — ONDANSETRON HCL 4 MG/2ML IJ SOLN: 4.0000 mg | Freq: Four times a day (QID) | INTRAMUSCULAR | Status: DC | PRN

## 2022-07-20 MED ORDER — INSULIN ASPART 100 UNIT/ML IJ SOLN: 0.0000 [IU] | Freq: Every day | INTRAMUSCULAR | Status: DC

## 2022-07-20 MED ORDER — ACETAMINOPHEN 650 MG RE SUPP: 650.0000 mg | Freq: Four times a day (QID) | RECTAL | Status: DC | PRN

## 2022-07-20 MED ORDER — INSULIN ASPART 100 UNIT/ML IJ SOLN: 0.0000 [IU] | Freq: Three times a day (TID) | INTRAMUSCULAR | Status: DC

## 2022-07-20 MED ORDER — ENOXAPARIN SODIUM 60 MG/0.6ML IJ SOSY
0.5000 mg/kg | PREFILLED_SYRINGE | INTRAMUSCULAR | Status: DC
  Filled 2022-07-20: qty 1.2

## 2022-07-20 MED ORDER — KETOROLAC TROMETHAMINE 15 MG/ML IJ SOLN
15.0000 mg | Freq: Once | INTRAMUSCULAR | Status: AC
  Administered 2022-07-20: 15 mg via INTRAVENOUS
  Filled 2022-07-20: qty 1

## 2022-07-20 MED ORDER — CYCLOBENZAPRINE HCL 5 MG PO TABS: 10.0000 mg | ORAL_TABLET | Freq: Two times a day (BID) | ORAL | Status: DC | PRN

## 2022-07-20 MED ORDER — SODIUM CHLORIDE 0.9 % IV SOLN: INTRAVENOUS | Status: AC

## 2022-07-20 MED ORDER — ENOXAPARIN SODIUM 40 MG/0.4ML IJ SOSY: 40.0000 mg | PREFILLED_SYRINGE | INTRAMUSCULAR | Status: DC

## 2022-07-20 MED ORDER — SODIUM CHLORIDE 0.9% FLUSH
3.0000 mL | Freq: Two times a day (BID) | INTRAVENOUS | Status: DC
  Administered 2022-07-20: 3 mL via INTRAVENOUS

## 2022-07-20 MED ORDER — SODIUM CHLORIDE 0.9 % IV SOLN: INTRAVENOUS | Status: DC

## 2022-07-20 MED ORDER — INSULIN DEGLUDEC 100 UNIT/ML ~~LOC~~ SOPN: 68.0000 [IU] | PEN_INJECTOR | Freq: Every day | SUBCUTANEOUS | Status: DC

## 2022-07-20 MED ORDER — RIMEGEPANT SULFATE 75 MG PO TBDP: 75.0000 mg | ORAL_TABLET | Freq: Every day | ORAL | Status: DC | PRN

## 2022-07-20 MED ORDER — INSULIN GLARGINE-YFGN 100 UNIT/ML ~~LOC~~ SOLN
68.0000 [IU] | Freq: Every day | SUBCUTANEOUS | Status: DC
  Filled 2022-07-20: qty 0.68

## 2022-07-20 MED ORDER — PANTOPRAZOLE SODIUM 40 MG PO TBEC
40.0000 mg | DELAYED_RELEASE_TABLET | Freq: Two times a day (BID) | ORAL | Status: DC
  Administered 2022-07-20: 40 mg via ORAL
  Filled 2022-07-20: qty 1

## 2022-07-20 MED ORDER — BUPROPION HCL ER (SR) 150 MG PO TB12
150.0000 mg | ORAL_TABLET | Freq: Two times a day (BID) | ORAL | Status: DC
  Administered 2022-07-20: 150 mg via ORAL
  Filled 2022-07-20 (×3): qty 1

## 2022-07-20 MED ORDER — MORPHINE SULFATE (PF) 2 MG/ML IV SOLN: 1.0000 mg | INTRAVENOUS | Status: DC | PRN

## 2022-07-20 MED ORDER — VANCOMYCIN HCL 1500 MG/300ML IV SOLN
1500.0000 mg | Freq: Two times a day (BID) | INTRAVENOUS | Status: DC
  Administered 2022-07-20: 1500 mg via INTRAVENOUS
  Filled 2022-07-20 (×3): qty 300

## 2022-07-20 MED ORDER — HYDROXYZINE HCL 25 MG PO TABS: 25.0000 mg | ORAL_TABLET | Freq: Every day | ORAL | Status: DC | PRN

## 2022-07-20 MED ORDER — ONDANSETRON HCL 4 MG PO TABS: 4.0000 mg | ORAL_TABLET | Freq: Four times a day (QID) | ORAL | Status: DC | PRN

## 2022-07-20 MED ORDER — FENOFIBRATE 160 MG PO TABS
160.0000 mg | ORAL_TABLET | Freq: Every day | ORAL | Status: DC
  Administered 2022-07-20: 160 mg via ORAL
  Filled 2022-07-20: qty 1

## 2022-07-20 MED ORDER — INSULIN ASPART 100 UNIT/ML IJ SOLN
25.0000 [IU] | Freq: Three times a day (TID) | INTRAMUSCULAR | Status: DC
  Administered 2022-07-20 (×2): 25 [IU] via SUBCUTANEOUS

## 2022-07-20 MED ORDER — HYOSCYAMINE SULFATE ER 0.375 MG PO TB12
0.3750 mg | ORAL_TABLET | Freq: Two times a day (BID) | ORAL | Status: DC
  Administered 2022-07-20: 0.375 mg via ORAL
  Filled 2022-07-20 (×2): qty 1

## 2022-07-20 MED ORDER — INSULIN ASPART 100 UNIT/ML IJ SOLN
0.0000 [IU] | Freq: Three times a day (TID) | INTRAMUSCULAR | Status: DC
  Administered 2022-07-20: 20 [IU] via SUBCUTANEOUS

## 2022-07-20 MED ORDER — ACETAMINOPHEN 325 MG PO TABS: 650.0000 mg | ORAL_TABLET | Freq: Four times a day (QID) | ORAL | Status: DC | PRN

## 2022-07-20 MED ORDER — HYDROCODONE-ACETAMINOPHEN 5-325 MG PO TABS
1.0000 | ORAL_TABLET | ORAL | Status: DC | PRN
  Administered 2022-07-20: 1 via ORAL
  Filled 2022-07-20: qty 1

## 2022-07-20 MED ORDER — FAMOTIDINE 20 MG PO TABS
40.0000 mg | ORAL_TABLET | Freq: Two times a day (BID) | ORAL | Status: DC
  Administered 2022-07-20: 40 mg via ORAL
  Filled 2022-07-20: qty 2

## 2022-07-20 MED ORDER — ENOXAPARIN SODIUM 60 MG/0.6ML IJ SOSY: 60.0000 mg | PREFILLED_SYRINGE | INTRAMUSCULAR | Status: DC

## 2022-07-20 MED ORDER — ORAL CARE MOUTH RINSE: 15.0000 mL | OROMUCOSAL | Status: DC | PRN

## 2022-07-20 MED ORDER — PREGABALIN 75 MG PO CAPS
75.0000 mg | ORAL_CAPSULE | Freq: Once | ORAL | Status: AC
  Administered 2022-07-20: 75 mg via ORAL
  Filled 2022-07-20: qty 1

## 2022-07-20 NOTE — ED Notes (Signed)
ED TO INPATIENT HANDOFF REPORT  ED Nurse Name and Phone #: Stanton Kidney, RN [phone]  S Name/Age/Gender Kelsey Vang 47 y.o. female Room/Bed: MH02/MH02  Code Status   Code Status: Not on file  Home/SNF/Other Home Patient oriented to: self, place, time, and situation Is this baseline? Yes   Triage Complete: Triage complete  Chief Complaint Cellulitis of right buttock [id number]  Triage Note Pt c/o intermittent fever, SHOB, lightheaded. States she's had an abscess on L leg, unsure if related. States blood sugars have been "in the 600's, high off & on," hx T2DM, no steroid use.  Doxy x10 days- finish tomorrow, 3rd abscess since starting abx.    Allergies Allergies  Allergen Reactions   Metformin And Related Other (See Comments)    GI upset   Metoclopramide Other (See Comments)    headache   Gabapentin Other (See Comments)    hallucination   Keflex [Cephalexin] Rash   Levemir [Insulin Detemir] Dermatitis    Whelps at injection site   Prednisone Palpitations    oral    Level of Care/Admitting Diagnosis ED Disposition     ED Disposition  Admit   Condition  --   [place]: [facility] [[id number]]  Level of Care: Progressive [102]  Admit to Progressive based on following criteria: MULTISYSTEM THREATS such as stable sepsis, metabolic/electrolyte imbalance with or without encephalopathy that is responding to early treatment.  May [facility] to [facility] or [facility] if equivalent level of care is available:: No  Interfacility transfer: Yes  Covid Evaluation: Asymptomatic - no recent exposure (last 10 days) testing not required  Diagnosis: Cellulitis of right buttock [id number]  Admitting Physician: Dory Horn [id number]  Attending Physician: Dory Horn [id number]  Certification:: I certify this patient will need inpatient services for at least 2 midnights  Estimated Length of Stay: 4          B Medical/Surgery  History Past Medical History:  Diagnosis Date   Anxiety    [employer]reesia 06/09/2020   Asthma    Chronic back pain    Depression    [employer]reesia 06/09/2020   Diabetes mellitus without complication    Gastroparesis due to DM    GERD (gastroesophageal reflux disease)    [employer]reesia 06/09/2020   Hx of migraines    Hyperlipemia    Hyperlipidemia    [employer]ia 06/09/2020   Hypertension    IBS (irritable bowel syndrome)    Nonarteritic ischemic optic neuropathy, unspecified laterality    Peripheral neuropathy    Sinusitis, chronic 01/17/2019   Sleep apnea    [employer]reesia 06/09/2020   Past Surgical History:  Procedure Laterality Date   CHOLECYSTECTOMY     ENDOMETRIAL ABLATION     GALLBLADDER SURGERY     TUBAL LIGATION       A IV Location/Drains/Wounds Patient Lines/Drains/Airways Status     Active Line/Drains/Airways     Name Placement date Placement time Site Days   Peripheral IV 07/19/22 20 G Anterior;Proximal;Right Antecubital 07/19/22  1800  Antecubital  1            Intake/Output Last 24 hours  Intake/Output Summary (Last 24 hours) at 07/20/2022 0306 Last data filed at 07/20/2022 0149 Gross per 24 hour  Intake 250 ml  Output --  Net 250 ml    Labs/Imaging Results for orders placed or performed during the hospital encounter of 07/19/22 (from the past 48 hour(s))  Lactic acid, plasma     Status: None  Collection Time: 07/19/22  6:46 PM  Result Value Ref Range   Lactic Acid, Venous 1.4 0.5 - 1.9 mmol/L    Comment: Performed at [facility], [address] [place], [place] [address]  Comprehensive metabolic panel     Status: Abnormal   Collection Time: 07/19/22  6:46 PM  Result Value Ref Range   Sodium 133 (L) 135 - 145 mmol/L   Potassium 4.0 3.5 - 5.1 mmol/L   Chloride 98 98 - 111 mmol/L   CO2 26 22 - 32 mmol/L   Glucose, Bld 363 (H) 70 - 99 mg/dL    Comment: Glucose reference range applies only to samples taken after fasting for at least 8 hours.   BUN 11 6  - 20 mg/dL   Creatinine, Ser 0.78 0.44 - 1.00 mg/dL   Calcium 9.4 8.9 - 10.3 mg/dL   Total Protein 7.7 6.5 - 8.1 g/dL   Albumin 3.6 3.5 - 5.0 g/dL   AST 22 15 - 41 U/L   ALT 29 0 - 44 U/L   Alkaline Phosphatase 134 (H) 38 - 126 U/L   Total Bilirubin 0.8 0.3 - 1.2 mg/dL   GFR, Estimated >60 >60 mL/min    Comment: (NOTE) Calculated using the CKD-EPI Creatinine Equation (2021)    Anion gap 9 5 - 15    Comment: Performed at [facility], [address] [place], [place] [address]  CBC with Differential     Status: Abnormal   Collection Time: 07/19/22  6:46 PM  Result Value Ref Range   WBC 15.3 (H) 4.0 - 10.5 K/uL   RBC 4.87 3.87 - 5.11 MIL/uL   Hemoglobin 14.8 12.0 - 15.0 g/dL   HCT 44.3 36.0 - 46.0 %   MCV 91.0 80.0 - 100.0 fL   MCH 30.4 26.0 - 34.0 pg   MCHC 33.4 30.0 - 36.0 g/dL   RDW 13.3 11.5 - 15.5 %   Platelets 302 150 - 400 K/uL   nRBC 0.0 0.0 - 0.2 %   Neutrophils Relative % 72 %   Neutro Abs 10.9 (H) 1.7 - 7.7 K/uL   Lymphocytes Relative 19 %   Lymphs Abs 3.0 0.7 - 4.0 K/uL   Monocytes Relative 6 %   Monocytes Absolute 0.9 0.1 - 1.0 K/uL   Eosinophils Relative 2 %   Eosinophils Absolute 0.4 0.0 - 0.5 K/uL   Basophils Relative 1 %   Basophils Absolute 0.1 0.0 - 0.1 K/uL   Immature Granulocytes 0 %   Abs Immature Granulocytes 0.06 0.00 - 0.07 K/uL    Comment: Performed at [facility], [address] [place], [place] [address]  Urinalysis, w/ Reflex to Culture (Infection Suspected) -Urine, Clean Catch     Status: Abnormal   Collection Time: 07/20/22  1:22 AM  Result Value Ref Range   Specimen Source URINE, CLEAN CATCH    Color, Urine YELLOW YELLOW   APPearance CLEAR CLEAR   Specific Gravity, Urine >=1.030 1.005 - 1.030   pH 5.5 5.0 - 8.0   Glucose, UA >=500 (A) NEGATIVE mg/dL   Hgb urine dipstick NEGATIVE NEGATIVE   Bilirubin Urine NEGATIVE NEGATIVE   Ketones, ur NEGATIVE NEGATIVE mg/dL   Protein, ur NEGATIVE NEGATIVE mg/dL   Nitrite  POSITIVE (A) NEGATIVE   Leukocytes,Ua NEGATIVE NEGATIVE   Squamous Epithelial / HPF 6-10 0 - 5 /HPF   WBC, UA 0-5 0 - 5 WBC/hpf    Comment: Reflex  urine culture not performed if WBC <=10, OR if Squamous epithelial cells >5. If Squamous epithelial cells >5, suggest recollection.   RBC / HPF 6-10 0 - 5 RBC/hpf   Bacteria, UA FEW (A) NONE SEEN    Comment: Performed at [facility], [address] [place], [place] [address]   CT ABDOMEN PELVIS WO CONTRAST  Result Date: 07/20/2022 CLINICAL DATA:  Intra-abdominal abscess about the perineum. EXAM: CT ABDOMEN AND PELVIS WITHOUT CONTRAST TECHNIQUE: Multidetector CT imaging of the abdomen and pelvis was performed following the standard protocol without IV contrast. RADIATION DOSE REDUCTION: This exam was performed according to the departmental dose-optimization program which includes automated exposure control, adjustment of the mA and/or kV according to patient size and/or use of iterative reconstruction technique. COMPARISON:  CT abdomen and pelvis 09/23/2017 FINDINGS: Lower chest: No acute abnormality. Hepatobiliary: No focal liver abnormality is seen. Status post cholecystectomy. No biliary dilatation. Pancreas: Unremarkable. Spleen: Unremarkable. Adrenals/Urinary Tract: No urinary calculi or hydronephrosis. Unremarkable bladder and adrenal glands. Stomach/Bowel: Normal caliber large and small bowel. No bowel wall thickening. Unremarkable appendix. Normal stomach. Vascular/Lymphatic: Aortic atherosclerosis. No enlarged abdominal or pelvic lymph nodes. Reproductive: Uterus and bilateral adnexa are unremarkable. Other: No free intraperitoneal fluid or air. Musculoskeletal: No acute osseous abnormality. Partially visualized stranding in the upper medial left thigh. No visualized soft tissue gas, fluid collection or abscess. IMPRESSION: 1. Partially visualized stranding in the upper medial left thigh. No visualized soft tissue gas, fluid collection, or  abscess. 2. No acute abnormality identified in the abdomen or pelvis. Aortic Atherosclerosis (ICD10-I70.0). Electronically Signed   By: Placido Sou M.D.   On: 07/20/2022 00:37   DG Chest 2 View  Result Date: 07/19/2022 CLINICAL DATA:  Fever and shortness of breath. EXAM: CHEST - 2 VIEW COMPARISON:  Aug 27, 2021 FINDINGS: The heart size and mediastinal contours are within normal limits. Low lung volumes are noted. There is no evidence of acute infiltrate, pleural effusion or pneumothorax. Multilevel degenerative changes seen throughout the thoracic spine. IMPRESSION: No active cardiopulmonary disease. Electronically Signed   By: Virgina Norfolk M.D.   On: 07/19/2022 19:15    Pending Labs Unresulted Labs (From admission, onward)     Start     Ordered   07/19/22 2257  Culture, blood (routine x 2)  BLOOD CULTURE X 2,   STAT      07/19/22 2301            Vitals/Pain Today's Vitals   07/20/22 0000 07/20/22 0100 07/20/22 0137 07/20/22 0200  BP: (!) 100/51 106/67 96/62 105/63  Pulse: (!) 107 (!) 108 (!) 108 (!) 106  Resp: 20 20 20 20   Temp:  99.3 F (37.4 C)    TempSrc:  Oral    SpO2: 95% 93% 93% 93%  Weight:      Height:      PainSc:    5     Isolation Precautions No active isolations  Medications Medications  ondansetron (ZOFRAN) injection 4 mg (4 mg Intravenous Given 07/19/22 2328)  lactated ringers infusion (has no administration in time range)  lactated ringers bolus 1,000 mL (has no administration in time range)  piperacillin-tazobactam (ZOSYN) IVPB 3.375 g (0 g Intravenous Stopped 07/20/22 0149)    Followed by  piperacillin-tazobactam (ZOSYN) IVPB 3.375 g (has no administration in time range)  vancomycin (VANCOCIN) IVPB 1000 mg/200 mL premix (0 mg Intravenous Stopped 07/20/22 0100)  lactated ringers bolus 1,000 mL (1,000 mLs Intravenous New Bag/Given 07/20/22  0000)  acetaminophen (TYLENOL) tablet 1,000 mg (1,000 mg Oral Given 07/19/22 2328)  pregabalin (LYRICA) capsule 75 mg  (75 mg Oral Given 07/20/22 0102)    Mobility walks     Focused Assessments skin   R Recommendations: See Admitting Provider Note  Report given to:   Additional Notes: Pt is A&O x4 walkie talkie - and ready to be in room.

## 2022-07-20 NOTE — Sepsis Progress Note (Signed)
Blood cultures show as collected in Epic, however, still not in process.  Secure chat sent to bedside RN, verified cultures were collected & sent to lab at 2320.

## 2022-07-20 NOTE — Progress Notes (Signed)
    Patient Name: Kelsey Vang, Kelsey Vang DOB: 1976/04/09 MRN: [id number] Transferring facility: [facility] Requesting provider: molpus Reason for transfer:  47 yo WF with cellulitis of right buttock/genitals. hx of hiadrenitis. pmhx of type 2 DM, morbid obesity(BMI 51). WBC 15k, started on zosyn, vanco Going to: Admission Status: inpatient Bed Type: progressive To Do: may need OB/GYN to examine genitalia  [facility] will assume care on arrival to accepting facility. Until arrival, medical decision making responsibilities remain with the EDP.  However, [facility] available 24/7 for questions and assistance.   Nursing staff please page [place] and Consults [phone]) as soon as the patient arrives to the hospital.  Kristopher Oppenheim, DO [facility]

## 2022-07-20 NOTE — Progress Notes (Signed)
Patient is refusing Lovenox, education provided.

## 2022-07-20 NOTE — Progress Notes (Signed)
RN called in pt room, and pt verbalized that she wants to leave hospital and go home. Per pt reason was not getting pain meds today and not having transportation tomorrow. Apparently her husband works, and won't be able to take her home if she gets discharged tomorrow.  Pt made aware that she is not medically cleared for discharge at this time, which increases her chance for readmission for the same issue.   Pt also made aware that insurance won't pay for hospital stay if she leaves AMA. Pt had pain meds earlier, and MD place order for adittional pain med if needed. Charge nurse spoke to pt as well, but pt still determined to leave. [name], MD aware. IV removed. Pt alert and oriented x4, has full capacity of decision making. Telemetry discontinued per pt request.  Pt left the floor at 2045. VS stable, pt in no acute distress.

## 2022-07-20 NOTE — Progress Notes (Signed)
Pharmacy Antibiotic Note  Kelsey Vang is a 47 y.o. female admitted on 07/19/2022 with  wound infection .  Pharmacy has been consulted for vanco dosing.  Plan: Vancomycin 1500 mg IV every 12 hours.  eAUC ~500 mcg*hr/mL (utilized Scr 0.78 mg/dL)  Height: 5\' 1"  (154.9 cm) Weight: 123.8 kg (273 lb) IBW/kg (Calculated) : 47.8  Temp (24hrs), Avg:99 F (37.2 C), Min:98.3 F (36.8 C), Max:99.7 F (37.6 C)  Recent Labs  Lab 07/19/22 1846  WBC 15.3*  CREATININE 0.78  LATICACIDVEN 1.4    Estimated Creatinine Clearance: 108.5 mL/min (by C-G formula based on SCr of 0.78 mg/dL).    Allergies  Allergen Reactions   Metformin And Related Other (See Comments)    GI upset   Metoclopramide Other (See Comments)    headache   Gabapentin Other (See Comments)    hallucination   Keflex [Cephalexin] Rash   Levemir [Insulin Detemir] Dermatitis    Whelps at injection site   Prednisone Palpitations    oral     Thank you for allowing pharmacy to be a part of this patient's care.  Vaughan Basta BS, PharmD, BCPS Clinical Pharmacist 07/20/2022 8:03 AM  Contact: [phone] after 3 PM  "Be curious, not judgmental..." -Jamal Maes

## 2022-07-20 NOTE — Sepsis Progress Note (Signed)
[facility] monitoring for the code sepsis protocol.  

## 2022-07-20 NOTE — Progress Notes (Signed)
Pt has arrived to [facility]. Alert and oriented x4, Identified appropriately. VS stable, no signs of acute distress. Admission notified. Pt placed on cardiac monitor and CCMD notified. Pt oriented to room and equipment instructed to use call bell for assistance, and call bell left within pt reach.

## 2022-07-20 NOTE — Hospital Course (Signed)
47 y.o.f  w/ morbid obesity BMI 51, insulin-IDDM type II  - on continuous blood glucose monitoring system in place, history of recurrent skin abscesses, diabetic gastroparesis, dyslipidemia, hypertension, generalized myalgias, migraines who has been on doxycycline x 9 days for recurrent abscess in her gluteal inferior labial area, presented with intermittent fever shortness of breath and lightheadedness and also pain and wound on her left side and buttock.  She also endorses blood sugar has been running very high at home up to 600 past 3 weeks. In the ED febrile up to 99.7 mildly tachycardic BP stable labs with pseudohyponatremia blood sugar in 363 normal anion gap leukocytosis 15 K chest x-ray unremarkable CT abdomen and pelvis -partial visualized stranding in the upper medial left thigh without any soft tissue gas fluid collection or abscess. Admission requested for further management.

## 2022-07-20 NOTE — ED Notes (Signed)
Pt did attempt to give a urine specimen and dropped it in toilet

## 2022-07-20 NOTE — ED Provider Notes (Signed)
Nursing notes and vitals signs, including pulse oximetry, reviewed.  Summary of this visit's results, reviewed by myself:  EKG:  EKG Interpretation  Date/Time:  Tuesday July 19 2022 18:47:33 EDT Ventricular Rate:  107 PR Interval:  150 QRS Duration: 88 QT Interval:  324 QTC Calculation: 433 R Axis:   96 Text Interpretation: Sinus tachycardia Consider right atrial enlargement Low voltage with right axis deviation no sig change from previous. Confirmed by Charlesetta Shanks [phone]) on 07/19/2022 7:10:54 PM        Labs:  Results for orders placed or performed during the hospital encounter of 07/19/22 (from the past 24 hour(s))  Lactic acid, plasma     Status: None   Collection Time: 07/19/22  6:46 PM  Result Value Ref Range   Lactic Acid, Venous 1.4 0.5 - 1.9 mmol/L  Comprehensive metabolic panel     Status: Abnormal   Collection Time: 07/19/22  6:46 PM  Result Value Ref Range   Sodium 133 (L) 135 - 145 mmol/L   Potassium 4.0 3.5 - 5.1 mmol/L   Chloride 98 98 - 111 mmol/L   CO2 26 22 - 32 mmol/L   Glucose, Bld 363 (H) 70 - 99 mg/dL   BUN 11 6 - 20 mg/dL   Creatinine, Ser 0.78 0.44 - 1.00 mg/dL   Calcium 9.4 8.9 - 10.3 mg/dL   Total Protein 7.7 6.5 - 8.1 g/dL   Albumin 3.6 3.5 - 5.0 g/dL   AST 22 15 - 41 U/L   ALT 29 0 - 44 U/L   Alkaline Phosphatase 134 (H) 38 - 126 U/L   Total Bilirubin 0.8 0.3 - 1.2 mg/dL   GFR, Estimated >60 >60 mL/min   Anion gap 9 5 - 15  CBC with Differential     Status: Abnormal   Collection Time: 07/19/22  6:46 PM  Result Value Ref Range   WBC 15.3 (H) 4.0 - 10.5 K/uL   RBC 4.87 3.87 - 5.11 MIL/uL   Hemoglobin 14.8 12.0 - 15.0 g/dL   HCT 44.3 36.0 - 46.0 %   MCV 91.0 80.0 - 100.0 fL   MCH 30.4 26.0 - 34.0 pg   MCHC 33.4 30.0 - 36.0 g/dL   RDW 13.3 11.5 - 15.5 %   Platelets 302 150 - 400 K/uL   nRBC 0.0 0.0 - 0.2 %   Neutrophils Relative % 72 %   Neutro Abs 10.9 (H) 1.7 - 7.7 K/uL   Lymphocytes Relative 19 %   Lymphs Abs 3.0 0.7 - 4.0 K/uL    Monocytes Relative 6 %   Monocytes Absolute 0.9 0.1 - 1.0 K/uL   Eosinophils Relative 2 %   Eosinophils Absolute 0.4 0.0 - 0.5 K/uL   Basophils Relative 1 %   Basophils Absolute 0.1 0.0 - 0.1 K/uL   Immature Granulocytes 0 %   Abs Immature Granulocytes 0.06 0.00 - 0.07 K/uL    Imaging Studies: CT ABDOMEN PELVIS WO CONTRAST  Result Date: 07/20/2022 CLINICAL DATA:  Intra-abdominal abscess about the perineum. EXAM: CT ABDOMEN AND PELVIS WITHOUT CONTRAST TECHNIQUE: Multidetector CT imaging of the abdomen and pelvis was performed following the standard protocol without IV contrast. RADIATION DOSE REDUCTION: This exam was performed according to the departmental dose-optimization program which includes automated exposure control, adjustment of the mA and/or kV according to patient size and/or use of iterative reconstruction technique. COMPARISON:  CT abdomen and pelvis 09/23/2017 FINDINGS: Lower chest: No acute abnormality. Hepatobiliary: No focal liver abnormality is seen. Status  post cholecystectomy. No biliary dilatation. Pancreas: Unremarkable. Spleen: Unremarkable. Adrenals/Urinary Tract: No urinary calculi or hydronephrosis. Unremarkable bladder and adrenal glands. Stomach/Bowel: Normal caliber large and small bowel. No bowel wall thickening. Unremarkable appendix. Normal stomach. Vascular/Lymphatic: Aortic atherosclerosis. No enlarged abdominal or pelvic lymph nodes. Reproductive: Uterus and bilateral adnexa are unremarkable. Other: No free intraperitoneal fluid or air. Musculoskeletal: No acute osseous abnormality. Partially visualized stranding in the upper medial left thigh. No visualized soft tissue gas, fluid collection or abscess. IMPRESSION: 1. Partially visualized stranding in the upper medial left thigh. No visualized soft tissue gas, fluid collection, or abscess. 2. No acute abnormality identified in the abdomen or pelvis. Aortic Atherosclerosis (ICD10-I70.0). Electronically Signed   By:  Placido Sou M.D.   On: 07/20/2022 00:37   DG Chest 2 View  Result Date: 07/19/2022 CLINICAL DATA:  Fever and shortness of breath. EXAM: CHEST - 2 VIEW COMPARISON:  Aug 27, 2021 FINDINGS: The heart size and mediastinal contours are within normal limits. Low lung volumes are noted. There is no evidence of acute infiltrate, pleural effusion or pneumothorax. Multilevel degenerative changes seen throughout the thoracic spine. IMPRESSION: No active cardiopulmonary disease. Electronically Signed   By: Virgina Norfolk M.D.   On: 07/19/2022 19:15    1:44 AM Dr. Bridgett Larsson accepts for admission to hospitalist service.   Kasen Adduci, Jenny Reichmann, MD 07/20/22 [phone]

## 2022-07-20 NOTE — H&P (Addendum)
History and Physical    Patient: Kelsey Vang [id number] DOB: [birth year] DOA: 07/19/2022 DOS: the patient was seen and examined on 07/20/2022 PCP: Flossie Buffy, NP  Patient coming from: Home  Chief Complaint:  Chief Complaint  Patient presents with   Abscess    L leg   Shortness of Breath   HPI: Kelsey Vang is a 47 y.o. female with medical history significant of obesity, diabetes mellitus 2 on insulin and has continuous blood glucose monitoring system in place, history of recurrent skin abscesses, diabetes related gastroparesis, dyslipidemia, hypertension, generalized myalgias and a history of migraines.  Patient has recently been on doxycycline for total of 9 days to treat recurrent abscess in the gluteal inferior labial area without improvement in symptoms, She has noticed increasing pain and redness as well no drainage.  She has also noticed significant increased CBG readings as high as 600s.  She has been experiencing intermittent fevers, shortness of breath and lightheadedness.  Because of the symptoms she presented to the ER for evaluation.  Upon presentation patient had a fever of 99.7, mild tachycardia 113 bpm, BP 109/75 on room air sats were 90%.  Labs revealed pseudohyponatremia of 133 in the context of glucose 363, normal anion gap, normal renal function, lactic acid was 1.4, WBCs 15,300 with normal differential.  Chest x-ray was unremarkable.  Given physical exam CT abdomen and pelvis was obtained to rule out abscess formation.  This revealed partial visualized stranding in the upper medial left thigh without any soft tissue gas fluid collection or abscess.  Hospitalist was asked to evaluate this patient for admission regarding complex cellulitis failed outpatient therapy with doxycycline.  Upon my evaluation of the patient she was still having pain in the left buttocks/distal labia area without any drainage noted.  Patient states she has a history of recurrent  abscesses and has been evaluated by multiple providers including dermatology.  This is her third skin abscess in 10 days.  She typically uses antibacterial soap with Hibiclens as well as clindamycin lotion and despite all this this particular area did not improve.   Review of Systems: As mentioned in the history of present illness. All other systems reviewed and are negative. Past Medical History:  Diagnosis Date   Anxiety    Phreesia 06/09/2020   Asthma    Chronic back pain    Depression    [employer]reesia 06/09/2020   Diabetes mellitus without complication    Gastroparesis due to DM    GERD (gastroesophageal reflux disease)    [employer]reesia 06/09/2020   Hx of migraines    Hyperlipemia    Hyperlipidemia    [employer]ia 06/09/2020   Hypertension    IBS (irritable bowel syndrome)    Nonarteritic ischemic optic neuropathy, unspecified laterality    Peripheral neuropathy    Sinusitis, chronic 01/17/2019   Sleep apnea    [employer]reesia 06/09/2020   Past Surgical History:  Procedure Laterality Date   CHOLECYSTECTOMY     ENDOMETRIAL ABLATION     GALLBLADDER SURGERY     TUBAL LIGATION     Social History:  reports that she has been smoking cigarettes. She has a 20.00 pack-year smoking history. She has never used smokeless tobacco. She reports that she does not drink alcohol and does not use drugs.  Allergies  Allergen Reactions   Metformin And Related Other (See Comments)    GI upset   Metoclopramide Other (See Comments)    headache   Gabapentin Other (See Comments)  hallucination   Keflex [Cephalexin] Rash   Levemir [Insulin Detemir] Dermatitis    Whelps at injection site   Prednisone Palpitations    oral    Family History  Problem Relation Age of Onset   Cancer Mother    Diabetes Father    Liver disease Father    Migraines Paternal Aunt    Heart attack Maternal Grandmother    Cancer Paternal Grandfather    Migraines Paternal Aunt     Prior to Admission medications   Medication  Sig Start Date End Date Taking? Authorizing Provider  acetaminophen (TYLENOL) 500 MG tablet Take 1,000 mg by mouth as needed for moderate pain.   Yes [provider]  benzonatate (TESSALON) 200 MG capsule Take 1 capsule (200 mg total) by mouth every 8 (eight) hours as needed for cough. Patient taking differently: Take 200 mg by mouth as needed for cough. 11/02/21  Yes Young, Tarri Fuller D, MD  botulinum toxin Type A (BOTOX) 200 units injection Inject 200 Units into the muscle every 3 (three) months. Inject 155 units IM into multiple site in the face,neck and head once every 90 days. Patient taking differently: Inject 155 Units into the muscle every 3 (three) months. Inject 155 units IM into multiple site in the face,neck and head once every 90 days. 11/16/21  Yes Jaffe, Adam R, DO  brimonidine (ALPHAGAN) 0.2 % ophthalmic solution Place 1 drop into both eyes in the morning and at bedtime.   Yes [provider]  buPROPion (WELLBUTRIN SR) 150 MG 12 hr tablet Take 1 tablet by mouth twice daily 05/17/22  Yes Young, Clinton D, MD  clindamycin (CLEOCIN T) 1 % lotion Apply 1 Application topically as needed (abcess).   Yes [provider]  cyclobenzaprine (FLEXERIL) 10 MG tablet Take 10 mg by mouth at bedtime as needed for muscle spasms. 06/12/20  Yes [provider]  diphenoxylate-atropine (LOMOTIL) 2.5-0.025 MG tablet Take 1 tablet by mouth as needed for diarrhea or loose stools.   Yes [provider]  famotidine (PEPCID) 40 MG tablet Take 40 mg by mouth 2 (two) times daily.   Yes [provider]  fenofibrate (TRICOR) 145 MG tablet Take 1 tablet (145 mg total) by mouth daily. 04/28/22  Yes Nche, Charlene Brooke, NP  fluticasone (FLONASE) 50 MCG/ACT nasal spray Place 2 sprays into both nostrils as needed for allergies. 07/18/21  Yes [provider]  hydrOXYzine (ATARAX) 25 MG tablet Take 50 mg by mouth daily as needed for anxiety (sleep). 05/27/21  Yes [provider]  hyoscyamine (LEVBID) 0.375 MG 12 hr tablet Take 0.375 mg by mouth as needed for cramping.   Yes [provider]  ibuprofen (ADVIL) 800 MG tablet Take 800 mg by mouth as needed for moderate pain.   Yes [provider]  insulin degludec (TRESIBA FLEXTOUCH) 200 UNIT/ML FlexTouch Pen Inject 68 Units into the skin daily. 03/08/22  Yes Philemon Kingdom, MD  insulin lispro (HUMALOG KWIKPEN) 200 UNIT/ML KwikPen Inject 25-35 Units into the skin 3 (three) times daily before meals. Patient taking differently: Inject 25-45 Units into the skin 3 (three) times daily before meals. 03/08/22  Yes Philemon Kingdom, MD  mupirocin cream (BACTROBAN) 2 % Apply 1 Application topically 2 (two) times daily. Patient taking differently: Apply 1 Application topically as needed (abess). 01/21/22  Yes Nche, Charlene Brooke, NP  nystatin (MYCOSTATIN/NYSTOP) powder Apply 1 Application topically 3 (three) times daily. Patient taking differently: Apply 1 Application topically as needed (irritation). 01/21/22  Yes Nche, Charlene Brooke, NP  ondansetron (ZOFRAN) 4 MG tablet Take 1 tablet (4 mg total) by mouth every 8 (eight) hours as needed for nausea or vomiting. Patient taking differently: Take 4 mg by mouth as needed for nausea or vomiting. 01/18/19  Yes Wendall Mola, NP  pantoprazole (PROTONIX) 40 MG tablet Take 40 mg by mouth 2 (two) times daily. 12/17/20  Yes [provider]  polycarbophil (FIBERCON) 625 MG tablet Take 1,250 mg by mouth in the morning and at bedtime.   Yes [provider]  Polyvinyl Alcohol-Povidone (REFRESH OP) Place 2-3 drops into both eyes in the morning and at bedtime.   Yes [provider]  pregabalin (LYRICA) 75 MG capsule Take 75-150 mg by mouth See admin instructions. Take 75 mg by mouth in the morning and 150 mg by mouth at bedtime   Yes [provider]  promethazine (PHENERGAN) 12.5 MG tablet Take 12.5 mg by mouth every 6 (six) hours  as needed for nausea or vomiting.   Yes [provider]  Rimegepant Sulfate (NURTEC) 75 MG TBDP Take 75 mg by mouth daily as needed. Patient taking differently: Take 75 mg by mouth daily as needed (migrains). 04/07/22  Yes Jaffe, Adam R, DO  albuterol (VENTOLIN HFA) 108 (90 Base) MCG/ACT inhaler INHALE 2 PUFFS BY MOUTH EVERY 6 HOURS AS NEEDED FOR WHEEZING FOR SHORTNESS OF BREATH Patient not taking: Reported on 07/20/2022 11/09/21   Nche, Charlene Brooke, NP  Budeson-Glycopyrrol-Formoterol (BREZTRI AEROSPHERE) 160-9-4.8 MCG/ACT AERO Inhale 2 puffs into the lungs in the morning and at bedtime. Patient not taking: Reported on 07/20/2022 01/07/22   Deneise Lever, MD  Continuous Blood Gluc Sensor (FREESTYLE LIBRE 2 SENSOR) MISC 1 each by Does not apply route every 14 (fourteen) days. 11/08/21   Philemon Kingdom, MD  glucose blood (FREESTYLE PRECISION NEO TEST) test strip Use as instructed to check blood sugar 4X daily 06/10/21   Philemon Kingdom, MD    Physical Exam: Vitals:   07/20/22 0529 07/20/22 0532 07/20/22 0555 07/20/22 0834  BP: 122/77   128/60  Pulse:      Resp: (!) 22 20 16 20   Temp: 98.3 F (36.8 C)   98.4 F (36.9 C)  TempSrc: Oral   Oral  SpO2: 94%     Weight:      Height:       Constitutional: NAD, calm, comfortable Respiratory: clear to auscultation bilaterally, no wheezing, no crackles. Normal respiratory effort. No accessory muscle use.  Cardiovascular: Regular rate and rhythm, no murmurs / rubs / gallops. No extremity edema. 2+ pedal pulses. No carotid bruits.  Abdomen: no tenderness, no masses palpated. No hepatosplenomegaly. Bowel sounds positive.  Musculoskeletal: no clubbing / cyanosis. No joint deformity upper and lower extremities. Good ROM, no contractures. Normal muscle tone.  Skin: no rashes, lesions, ulcers.  Focal redness, erythema and induration involving the left upper buttock extending towards the distal labia without any drainage noted.  This area is very  tender to palpation. Neurologic: CN 2-12 grossly intact. Sensation intact, DTR normal. Strength 5/5 x all 4 extremities.  Psychiatric: Normal judgment and insight. Alert and oriented x 3. Normal mood.    Data Reviewed:  As per HPI  Assessment and plan: Complex cellulitis left superior buttock extending to distal labia Failed 9 days of outpatient doxycycline Area not draining and is very indurated and tender to touch No drainable fluid collections noted on imaging Apply warm compress as needed Begin vancomycin and Zosyn IV  to cover both gram-positive and gram-negative organisms-will check MRSA PCR and if negative can de-escalate to cephalosporin Vicodin for pain No drainable area so no indication at this time to involve general surgery but I will discuss with attending physician Follow-up on blood cultures   Diabetes mellitus 2 w/ hyperglycemia At home utilizes long-acting insulin plus meal coverage Will only utilize HS SSI since is receiving meal coverage insulin Continue home Tyler Aas and will continue meal coverage Will also follow CBGs and provide SSI noting patient has had significant hyperglycemia due to acute infectious process Check hemoglobin A1c  Gastroparesis secondary to diabetes Continue home H2 blockers and PPI  Sleep apnea Can order CPAP at at bedtime if patient utilizes Okay to bring CPAP equipment from home  Dyslipidemia Continue fenofibrate  Obesity BMI 51.5 Weight reduction strategies regularly addressed by patient's PCP Has tried GLP's before and had too many GI symptoms especially in the context of diabetic gastroparesis     Advance Care Planning:   Code Status: Full Code   DVT prophylaxis: Patient refuses Lovenox so we will utilize SCDs  Consults: None  Family Communication: Patient only  Severity of Illness: The appropriate patient status for this patient is INPATIENT. Inpatient status is judged to be reasonable and necessary in order to  provide the required intensity of service to ensure the patient's safety. The patient's presenting symptoms, physical exam findings, and initial radiographic and laboratory data in the context of their chronic comorbidities is felt to place them at high risk for further clinical deterioration. Furthermore, it is not anticipated that the patient will be medically stable for discharge from the hospital within 2 midnights of admission.   * I certify that at the point of admission it is my clinical judgment that the patient will require inpatient hospital care spanning beyond 2 midnights from the point of admission due to high intensity of service, high risk for further deterioration and high frequency of surveillance required.*  Author: Erin Hearing, NP 07/20/2022 11:29 AM  For on call review [email or web address].

## 2022-07-20 NOTE — ED Notes (Signed)
[facility] called for transport. 

## 2022-07-20 NOTE — ED Notes (Signed)
Patient transported to CT 

## 2022-07-21 ENCOUNTER — Telehealth: Payer: Self-pay

## 2022-07-21 NOTE — Discharge Summary (Signed)
                                                         AMA  Patient during night on 08/19/22 expressed desire to leave the Hospital immidiately, night staffs warned that this is not Medically advisable at this time, and can result in Medical complications like readmission, worsening of infection death and Disability, she accepted the risks involved and assumed full responsibilty of this decision. Night staffs made my best effort to convince him to stay.  This note is done next day  Antonieta Pert M.D on 07/21/2022 at 10:26 AM  [facility]  Time < 30 minutes  Last Note Below  Which is Admission note: 47 y.o.f  w/ morbid obesity BMI 51, insulin-IDDM type II  - on continuous blood glucose monitoring system in place, history of recurrent skin abscesses, diabetic gastroparesis, dyslipidemia, hypertension, generalized myalgias, migraines who has been on doxycycline x 9 days for recurrent abscess in her gluteal inferior labial area, presented with intermittent fever shortness of breath and lightheadedness and also pain and wound on her left side and buttock.  She also endorses blood sugar has been running very high at home up to 600 past 3 weeks. In the ED febrile up to 99.7 mildly tachycardic BP stable labs with pseudohyponatremia blood sugar in 363 normal anion gap leukocytosis 15 K chest x-ray unremarkable CT abdomen and pelvis -partial visualized stranding in the upper medial left thigh without any soft tissue gas fluid collection or abscess. Admission requested for further management.    A/P Complex cellulitis of left superior buttock extending to the distal labia Recurrent skin infection: Failed oral doxycycline x 9 days no fluctuation on exam, no drainable fluid collection on CT will continue with vancomycin/Zosyn, check MRSA PCR, continue pain control, follow-up blood culture   IDDM type II: At home  takes [place] 68 units daily, insulin lispro 25 to 35 units Premeal . Having uncontrolled hyperglycemia in the setting of ongoing infection, continue SSI, Semglee 16 units bedtime and 25 units Premeal NovoLog, monitor and adjust insulin.  Consult DM coordinator   Diabetic gastroparesis continue H2 blocker PPI supportive care Sleep apnea-not using CPAP due to intolerance Hyperlipidemia continue fibrate Morbid obesity BMI 51 will benefit weight loss healthy lifestyle

## 2022-07-21 NOTE — Transitions of Care (Post Inpatient/ED Visit) (Signed)
   07/21/2022  Name: Kelsey Vang MRN: [id number] DOB: Nov 09, 1975  Today's TOC FU Call Status: Today's TOC FU Call Status:: Unsuccessul Call (1st Attempt) Unsuccessful Call (1st Attempt) Date: 07/21/22  Attempted to reach the patient regarding the most recent Inpatient/ED visit.  Follow Up Plan: Additional outreach attempts will be made to reach the patient to complete the Transitions of Care (Post Inpatient/ED visit) call.   Signature  Giamarie Bueche

## 2022-07-22 ENCOUNTER — Ambulatory Visit: Payer: Commercial Managed Care - HMO | Admitting: Nurse Practitioner

## 2022-07-25 LAB — CULTURE, BLOOD (ROUTINE X 2)
Culture: NO GROWTH
Culture: NO GROWTH
Special Requests: ADEQUATE
Special Requests: ADEQUATE

## 2022-07-27 ENCOUNTER — Ambulatory Visit: Payer: Medicaid Other | Admitting: Podiatry

## 2022-08-11 ENCOUNTER — Telehealth: Payer: Self-pay

## 2022-08-11 ENCOUNTER — Other Ambulatory Visit (HOSPITAL_COMMUNITY): Payer: Self-pay

## 2022-08-11 NOTE — Telephone Encounter (Signed)
PA needed for Nurtec per Pharmacy

## 2022-08-12 ENCOUNTER — Ambulatory Visit: Payer: Commercial Managed Care - HMO

## 2022-08-15 DIAGNOSIS — G5601 Carpal tunnel syndrome, right upper limb: Secondary | ICD-10-CM | POA: Diagnosis not present

## 2022-08-17 ENCOUNTER — Telehealth: Payer: Self-pay

## 2022-08-17 ENCOUNTER — Other Ambulatory Visit (HOSPITAL_COMMUNITY): Payer: Self-pay

## 2022-08-17 NOTE — Telephone Encounter (Signed)
PA request received via CMM/provider for Nurtec 75MG  dispersible tablets  PA has been submitted to [employer] and is pending additional questions/determination  Key: [facility]

## 2022-08-17 NOTE — Telephone Encounter (Signed)
PA has been submitted and will be updated in additional encounter created.  

## 2022-08-19 ENCOUNTER — Telehealth: Payer: Self-pay

## 2022-08-19 NOTE — Progress Notes (Signed)
   Care Guide Note  08/19/2022 Name: Kelsey Vang MRN: [id number] DOB: Sep 05, 1975  Referred by: Anne Ng, NP Reason for referral : Care Coordination (Outreach to schedule with Pharm d NEW MM DM )   Kelsey Vang is a 47 y.o. year old female who is a primary care patient of Nche, Bonna Gains, NP. [name] was referred to the pharmacist for assistance related to DM.    Successful contact was made with the patient to discuss pharmacy services. Patient declines engagement at this time. Contact information was provided to the patient should they wish to reach out for assistance at a later time.  Penne Lash, RMA Care Guide [facility]  [place], [place] [address] Direct Dial: [phone] [email or web address]

## 2022-08-22 ENCOUNTER — Encounter (HOSPITAL_COMMUNITY): Payer: Self-pay

## 2022-08-26 ENCOUNTER — Ambulatory Visit: Payer: Medicaid Other | Admitting: Podiatry

## 2022-08-29 DIAGNOSIS — G5601 Carpal tunnel syndrome, right upper limb: Secondary | ICD-10-CM | POA: Diagnosis not present

## 2022-08-29 DIAGNOSIS — M65312 Trigger thumb, left thumb: Secondary | ICD-10-CM | POA: Diagnosis not present

## 2022-08-31 ENCOUNTER — Telehealth: Payer: Medicaid Other | Admitting: Physician Assistant

## 2022-08-31 DIAGNOSIS — R3989 Other symptoms and signs involving the genitourinary system: Secondary | ICD-10-CM | POA: Diagnosis not present

## 2022-08-31 MED ORDER — SULFAMETHOXAZOLE-TRIMETHOPRIM 800-160 MG PO TABS
1.0000 | ORAL_TABLET | Freq: Two times a day (BID) | ORAL | 0 refills | Status: DC
Start: 2022-08-31 — End: 2022-10-24

## 2022-08-31 NOTE — Progress Notes (Signed)
E-Visit for Urinary Problems  We are sorry that you are not feeling well.  Here is how we plan to help!  Based on what you shared with me it looks like you most likely have a simple urinary tract infection.  A UTI (Urinary Tract Infection) is a bacterial infection of the bladder.  Most cases of urinary tract infections are simple to treat but a key part of your care is to encourage you to drink plenty of fluids and watch your symptoms carefully.  I have prescribed Bactrim DS One tablet twice a day for 5 days.  Your symptoms should gradually improve. Call us if the burning in your urine worsens, you develop worsening fever, back pain or pelvic pain or if your symptoms do not resolve after completing the antibiotic.  Urinary tract infections can be prevented by drinking plenty of water to keep your body hydrated.  Also be sure when you wipe, wipe from front to back and don't hold it in!  If possible, empty your bladder every 4 hours.  HOME CARE Drink plenty of fluids Compete the full course of the antibiotics even if the symptoms resolve Remember, when you need to go.go. Holding in your urine can increase the likelihood of getting a UTI! GET HELP RIGHT AWAY IF: You cannot urinate You get a high fever Worsening back pain occurs You see blood in your urine You feel sick to your stomach or throw up You feel like you are going to pass out  MAKE SURE YOU  Understand these instructions. Will watch your condition. Will get help right away if you are not doing well or get worse.   Thank you for choosing an e-visit.  Your e-visit answers were reviewed by a board certified advanced clinical practitioner to complete your personal care plan. Depending upon the condition, your plan could have included both over the counter or prescription medications.  Please review your pharmacy choice. Make sure the pharmacy is open so you can pick up prescription now. If there is a problem, you may contact  your provider through MyChart messaging and have the prescription routed to another pharmacy.  Your safety is important to us. If you have drug allergies check your prescription carefully.   For the next 24 hours you can use MyChart to ask questions about today's visit, request a non-urgent call back, or ask for a work or school excuse. You will get an email in the next two days asking about your experience. I hope that your e-visit has been valuable and will speed your recovery.  I have spent 5 minutes in review of e-visit questionnaire, review and updating patient chart, medical decision making and response to patient.   Kenadee Gates M Alexandros Ewan, PA-C  

## 2022-09-01 NOTE — Telephone Encounter (Signed)
PA denied. Letter received Patient has to try two triptans.  Per Last ov note: Past abortive triptans:  Maxalt, sumatriptan - contraindicated (ischemic optic neuropathy)   Spoke to rep please re submit PA.  New Key created in Covermymeds. KEY: [id number]

## 2022-09-07 NOTE — Telephone Encounter (Signed)
New PA request has been APPROVED from 09/05/2022-09/05/2023

## 2022-09-10 ENCOUNTER — Other Ambulatory Visit (HOSPITAL_COMMUNITY): Payer: Self-pay

## 2022-09-13 ENCOUNTER — Telehealth: Payer: Self-pay

## 2022-09-13 ENCOUNTER — Encounter: Payer: Self-pay | Admitting: Neurology

## 2022-09-13 NOTE — Telephone Encounter (Signed)
Per Patient she has new insurance.  New card added to chart.  PA team Patient has a botox appointment 6/14.

## 2022-09-15 ENCOUNTER — Telehealth: Payer: Self-pay | Admitting: Pharmacy Technician

## 2022-09-15 ENCOUNTER — Other Ambulatory Visit (HOSPITAL_COMMUNITY): Payer: Self-pay

## 2022-09-15 NOTE — Telephone Encounter (Signed)
PA has been submitted EXPEDITED, and telephone encounter has been created.  

## 2022-09-15 NOTE — Telephone Encounter (Signed)
Patient Advocate Encounter  Received notification from [employer] that prior authorization for BOTOX 200 is required.   PA submitted on 5.30.24 Key [id number] Status is pending

## 2022-09-19 ENCOUNTER — Ambulatory Visit (INDEPENDENT_AMBULATORY_CARE_PROVIDER_SITE_OTHER): Payer: Medicaid Other | Admitting: Podiatry

## 2022-09-19 DIAGNOSIS — M79675 Pain in left toe(s): Secondary | ICD-10-CM

## 2022-09-19 DIAGNOSIS — E119 Type 2 diabetes mellitus without complications: Secondary | ICD-10-CM

## 2022-09-19 DIAGNOSIS — B351 Tinea unguium: Secondary | ICD-10-CM | POA: Diagnosis not present

## 2022-09-19 DIAGNOSIS — M79674 Pain in right toe(s): Secondary | ICD-10-CM

## 2022-09-19 NOTE — Progress Notes (Signed)
Chief Complaint  Patient presents with   Diabetes    Patient came in today for Diabetic foot care, A1c- 11.8 BG- 244, Nail trim callus, nail fungus,pain in the joints of the toes, cramping, Neuropathy,     SUBJECTIVE Patient with a history of diabetes mellitus presents to office today complaining of elongated, thickened nails that cause pain while ambulating in shoes.  Patient is unable to trim their own nails due to diabetic retinopathy.  A1c 11.8 on 07/20/2022.  Patient states also that she has a history of lumbar radiculopathy and chronic lower back pain.  She has seen multiple neurospine and orthospine specialists.  She states that her A1c levels are elevated due to multiple lower back injections.  Apparently she is not a surgical candidate.  Patient is here for further evaluation and treatment.  Past Medical History:  Diagnosis Date   Anxiety    [facility]ia 06/09/2020   Asthma    Chronic back pain    Depression    [employer]ia 06/09/2020   Diabetes mellitus without complication (HCC)    Gastroparesis due to DM (HCC)    GERD (gastroesophageal reflux disease)    Phreesia 06/09/2020   Hx of migraines    Hyperlipemia    Hyperlipidemia    [employer]reesia 06/09/2020   Hypertension    IBS (irritable bowel syndrome)    Nonarteritic ischemic optic neuropathy, unspecified laterality    Peripheral neuropathy    Sinusitis, chronic 01/17/2019   Sleep apnea    [facility]ia 06/09/2020    Allergies  Allergen Reactions   Metformin And Related Other (See Comments)    GI upset   Metoclopramide Other (See Comments)    headache   Gabapentin Other (See Comments)    hallucination   Keflex [Cephalexin] Rash   Levemir [Insulin Detemir] Dermatitis    Whelps at injection site   Prednisone Palpitations    oral     OBJECTIVE General Patient is awake, alert, and oriented x 3 and in no acute distress. Derm Skin is dry and supple bilateral. Negative open lesions or macerations. Remaining integument  unremarkable. Nails are tender, long, thickened and dystrophic with subungual debris, consistent with onychomycosis, 1-5 bilateral. No signs of infection noted. Vasc  DP and PT pedal pulses palpable bilaterally. Temperature gradient within normal limits.  Neuro light touch and protective threshold sensation diminished bilaterally.  Musculoskeletal Exam No symptomatic pedal deformities noted bilateral. Muscular strength within normal limits.  ASSESSMENT 1. Diabetes Mellitus w/ peripheral neuropathy; uncontrolled.  Last A1c 11.8 on 07/20/2022 2.  Pain due to onychomycosis of toenails bilateral 3.  Lumbar radiculopathy bilateral lower extremities  PLAN OF CARE 1. Patient evaluated today.  Comprehensive diabetic foot exam performed today 2. Instructed to maintain good pedal hygiene and foot care. Stressed importance of controlling blood sugar.  3. Mechanical debridement of nails 1-5 bilaterally performed using a nail nipper. Filed with dremel without incident.  4.  Explained to the patient that the neuropathic type symptoms she is experiencing are likely due to her history of lumbar radiculopathy complicated by uncontrolled diabetes.   5.  Return to clinic in 3 mos. for routine diabetic foot care    Felecia Shelling, DPM [facility]  Dr. Felecia Shelling, DPM    [address]  [place], [place] [address]                Office [phone]  Fax [phone]

## 2022-09-20 ENCOUNTER — Other Ambulatory Visit: Payer: Self-pay

## 2022-09-20 ENCOUNTER — Other Ambulatory Visit (HOSPITAL_COMMUNITY): Payer: Self-pay

## 2022-09-20 MED ORDER — BOTOX 200 UNITS IJ SOLR
155.0000 [IU] | INTRAMUSCULAR | 4 refills | Status: AC
  Filled 2022-09-20: qty 1, 90d supply, fill #0
  Filled 2022-09-22: qty 1, 34d supply, fill #0
  Filled 2022-12-14: qty 1, 34d supply, fill #1

## 2022-09-20 NOTE — Telephone Encounter (Signed)
Patient Advocate Encounter  Prior Authorization for BOTOX 200 has been approved with [employer].    PA# [id number] Effective dates: 5.30.24 through 11.29.24  Per [employer] test claim, copay for 34 days supply is $4

## 2022-09-20 NOTE — Telephone Encounter (Signed)
New pharmacy info given to patient.  Per Patient MDCD expires in July. We will let us know what her new insurance will be then.    Script sent to [facility].

## 2022-09-21 ENCOUNTER — Other Ambulatory Visit (HOSPITAL_COMMUNITY): Payer: Self-pay

## 2022-09-22 ENCOUNTER — Other Ambulatory Visit: Payer: Self-pay

## 2022-09-22 ENCOUNTER — Other Ambulatory Visit (HOSPITAL_COMMUNITY): Payer: Self-pay

## 2022-09-23 ENCOUNTER — Other Ambulatory Visit: Payer: Self-pay

## 2022-09-23 ENCOUNTER — Other Ambulatory Visit (HOSPITAL_COMMUNITY): Payer: Self-pay

## 2022-09-27 ENCOUNTER — Other Ambulatory Visit (HOSPITAL_COMMUNITY): Payer: Self-pay

## 2022-09-30 ENCOUNTER — Ambulatory Visit (INDEPENDENT_AMBULATORY_CARE_PROVIDER_SITE_OTHER): Payer: Medicaid Other | Admitting: Neurology

## 2022-09-30 DIAGNOSIS — G43709 Chronic migraine without aura, not intractable, without status migrainosus: Secondary | ICD-10-CM | POA: Diagnosis not present

## 2022-09-30 MED ORDER — ONABOTULINUMTOXINA 100 UNITS IJ SOLR
200.0000 [IU] | Freq: Once | INTRAMUSCULAR | Status: AC
Start: 2022-09-30 — End: 2022-09-30
  Administered 2022-09-30: 155 [IU] via INTRAMUSCULAR

## 2022-09-30 NOTE — Progress Notes (Signed)
[facility]  ° °Procedure Note Botox ° °Attending: Dr. Neva Ramaswamy ° °Preoperative Diagnosis(es): Chronic migraine ° °Consent obtained from: The patient °Benefits discussed included, but were not limited to decreased muscle tightness, increased joint range of motion, and decreased pain.  Risk discussed included, but were not limited pain and discomfort, bleeding, bruising, excessive weakness, venous thrombosis, muscle atrophy and dysphagia.  Anticipated outcomes of the procedure as well as he risks and benefits of the alternatives to the procedure, and the roles and tasks of the personnel to be involved, were discussed with the patient, and the patient consents to the procedure and agrees to proceed. A copy of the patient medication guide was given to the patient which explains the blackbox warning. ° °Patients identity and treatment sites confirmed Yes.  . ° °Details of Procedure: °Skin was cleaned with alcohol. Prior to injection, the needle plunger was aspirated to make sure the needle was not within a blood vessel.  There was no blood retrieved on aspiration.   ° °Following is a summary of the muscles injected  And the amount of Botulinum toxin used: ° °Dilution °200 units of Botox was reconstituted with 4 ml of preservative free normal saline. °Time of reconstitution: At the time of the office visit (<30 minutes prior to injection)  ° °Injections  °155 total units of Botox was injected with a 30 gauge needle. ° °Injection Sites: °L occipitalis: 15 units- 3 sites  °R occiptalis: 15 units- 3 sites ° °L upper trapezius: 15 units- 3 sites °R upper trapezius: 15 units- 3 sits          °L paraspinal: 10 units- 2 sites °R paraspinal: 10 units- 2 sites ° °Face °L frontalis(2 injection sites):10 units   °R frontalis(2 injection sites):10 units         °L corrugator: 5 units   °R corrugator: 5 units           °Procerus: 5 units   °L temporalis: 20 units °R temporalis: 20 units  ° °Agent:  °200 units of botulinum Type  A (Onobotulinum Toxin type A) was reconstituted with 4 ml of preservative free normal saline.  °Time of reconstitution: At the time of the office visit (<30 minutes prior to injection)  ° ° ° Total injected (Units):  155 ° Total wasted (Units):  45 ° °Patient tolerated procedure well without complications.   °Reinjection is anticipated in 3 months. ° ° °

## 2022-10-13 ENCOUNTER — Other Ambulatory Visit: Payer: Self-pay | Admitting: Nurse Practitioner

## 2022-10-14 ENCOUNTER — Encounter: Payer: Self-pay | Admitting: Nurse Practitioner

## 2022-10-14 DIAGNOSIS — B370 Candidal stomatitis: Secondary | ICD-10-CM

## 2022-10-14 MED ORDER — NYSTATIN 100000 UNIT/ML MT SUSP
5.0000 mL | Freq: Four times a day (QID) | OROMUCOSAL | 0 refills | Status: DC
Start: 2022-10-14 — End: 2023-01-04

## 2022-10-14 NOTE — Telephone Encounter (Signed)
Please see the MyChart message reply(ies) for my assessment and plan.  The patient gave consent for this Medical Advice Message and is aware that it may result in a bill to their insurance company as well as the possibility that this may result in a co-payment or deductible. They are an established patient, but are not seeking medical advice exclusively about a problem treated during an in person or video visit in the last 7 days. I did not recommend an in person or video visit within 7 days of my reply.  I spent a total of 7 minutes cumulative time within 7 days through MyChart messaging Kerrick Miler, NP  

## 2022-10-19 NOTE — Progress Notes (Deleted)
NEUROLOGY FOLLOW UP OFFICE NOTE  Kelsey Vang [id number]  Assessment/Plan:   Bilateral non-arteritic optic neuropathy - likely due to controlled diabetes Chronic migraine with aura, without status migrainosus, not intractable - over 15 headache days a month for over 3 consecutive months - failed Aimovig, beta blocker, amitriptyline, topiramate - candidate for Botox Obstructive sleep apnea     Migraine prevention:  Botox *** Migraine rescue:  Bernita Raisin *** ASA 81mg  daily and optimize glycemic control and cholesterol management *** Limit use of pain relievers to no more than 2 days out of week to prevent risk of rebound or medication-overuse headache. Keep headache diary Follow up for routine office visit ***  Subjective:  Kelsey Vang is a 47 year old female with diabetes who follows up for bilateral non-arteritic optic neuropathy and migraines.   UPDATE: She has been on Botox for a year. Intensity:  severe Duration:  *** with Bernita Raisin Frequency:  *** Having a tension type headache in back of neck and head daily.  - treats with lidocaine roll on and Flexeril at bedtime (mostly for back) Frequency of abortive medication: Excedrin 3 days a week. Current NSAIDS: ibuprofen 800mg  BID (daily for back) Current analgesics: none Current triptans: None Current ergotamine: None Current anti-emetic: None Current muscle relaxants: Zanflex 2mg  (just prescribed today for back pain) Current anti-anxiolytic: None Current sleep aide: None Current Antihypertensive medications: Metoprolol, lisinopril Current Antidepressant medications: sertraline Current Anticonvulsant medications: None Current anti-CGRP:  Ubrelvy ***mg Current Vitamins/Herbal/Supplements: None Current Antihistamines/Decongestants: Flonase, Zyrtec Other therapy: None Hormone/birth control: none   Caffeine:  No coffee.  Will drink caffeine with headache Alcohol: No Smoker: No Diet: Hydrates.  Does not skip  meals Exercise: Walks Depression: No; Anxiety: No Other pain: back pain  Sleep hygiene: Poor.  Has OSA and compliant with CPAP   HISTORY: In October 2022, she developed blurred vision followed by darkening vision in the lower half of visual field her left eye.  No associated eye pain but she is on Lyrica for chronic back and leg pain.  She reports intermittent burst of blue and red colors in the left eye, and vision looks red with faded bluish green with eyes closed.  She went to her optometrist whose exam revealed left optic neuritis and disc hemorrhage with full visual fields and no APD.  Due to persistent vision loss, she received round of Solu-Medrol for optic neuritis.  No improvement, so she did receive Acthar but also no improvement.  Underwent workup for optic neuritis: MRI of brain and orbits with and without contrast on 02/22/2021 was limited due to motion but revealed only mild nonspecific white matter T2/FLAIR hyperintensities but otherwise no obvious findings of MS or abnormal enhancement of brain and optic nerves.  MRI of cervical and thoracic spine with and without contrast on 02/24/2021 revealed mild spinal and bilateral foraminal stenosis at C6-7 and mild right foraminal narrowing at C4-5 and C5-6 but no spinal cord abnormalities such as demyelination.  She underwent LP on 03/01/2021 for CSF analysis which demonstrated opening pressure of 15.5 cm water, CSF cell count 3, protein 80 (patient diabetic), glucose 94 (patient diabetic), no oligoclonal bands, IgG 3, negative ACE, negative culture, negative cytology, nonreactive VDRL.  Serum labs include negative NMO IgG, negative ANA, sed rate 44, B12 438, ACE 36, vit A 64.  Due to insurance reasons, she was unable to get an ophthalmology appointment until 05/11/2021.  She saw Dr. Dione Booze who actually noted bilateral non-arteritic optic neuropathy (each episode  occurred after COVID).  We referred her to sleep medicine and she has upcoming appointment.   Hgb A1c from Octover was 8.9.   Migraines: Onset: Since late teens. Increased frequency for past year. Location:  diffuse Quality:  "lightening bolt" Initial intensity:  Severe..  Sometimes wakes her from sleep.  She denies new headache. Aura:  Occasionally may see flashing lights when her eyes are closed.  Difficult to focus Prodrome:  no Postdrome:  tired Associated symptoms: Nausea, vomiting, photophobia, phonophobia, osmophobia, blurred vision.  No autonomic symptoms.  She denies associated unilateral numbness or weakness. Initial duration:  With treatment, 2 hours severe but less severe for entire day (sometimes 2 days). Initial Frequency:  1 to 2 days a week but has constant dull left frontal/top headache Initial Frequency of abortive medication: ibuprofen 3 days a week.  Sumatriptan 1 to 2 days a week. Triggers: None Relieving factors:  rest Activity:  Cannot function at all once every few months.   Past NSAIDS:  no Past analgesics:  Tylenol, Fioricet Past abortive triptans:  Maxalt, sumatriptan - contraindicated (ischemic optic neuropathy) Past muscle relaxants:  Flexeril Past anti-emetic:  no Past antihypertensive medications:  no Past antidepressant medications:   Amitriptyline 100 mg (ineffective, cause palpitations) Past anticonvulsant medications:  Lyrica, topiramate (hallucinations), gabapentin (hallucinations) Past anti-CGRP:  Ajovy (insurance issue), Aimovig Past vitamins/Herbal/Supplements:  no Past antihistamines/decongestants:  no Other past therapies:  no   Family history of headache:  mother  PAST MEDICAL HISTORY: Past Medical History:  Diagnosis Date   Anxiety    Phreesia 06/09/2020   Asthma    Chronic back pain    Depression    Phreesia 06/09/2020   Diabetes mellitus without complication (HCC)    Gastroparesis due to DM (HCC)    GERD (gastroesophageal reflux disease)    Phreesia 06/09/2020   Hx of migraines    Hyperlipemia    Hyperlipidemia     Phreesia 06/09/2020   Hypertension    IBS (irritable bowel syndrome)    Nonarteritic ischemic optic neuropathy, unspecified laterality    Peripheral neuropathy    Sinusitis, chronic 01/17/2019   Sleep apnea    Phreesia 06/09/2020    MEDICATIONS: Current Outpatient Medications on File Prior to Visit  Medication Sig Dispense Refill   acetaminophen (TYLENOL) 500 MG tablet Take 1,000 mg by mouth as needed for moderate pain.     albuterol (VENTOLIN HFA) 108 (90 Base) MCG/ACT inhaler INHALE 2 PUFFS BY MOUTH EVERY 6 HOURS AS NEEDED FOR WHEEZING FOR SHORTNESS OF BREATH 9 g 0   benzonatate (TESSALON) 200 MG capsule Take 1 capsule (200 mg total) by mouth every 8 (eight) hours as needed for cough. (Patient taking differently: Take 200 mg by mouth as needed for cough.) 50 capsule 2   botulinum toxin Type A (BOTOX) 200 units injection Inject 155 Units into the muscle every 3 (three) months. Inject 155 units IM into multiple site in the face,neck and head once every 90 days. 1 each 4   brimonidine (ALPHAGAN) 0.2 % ophthalmic solution Place 1 drop into both eyes in the morning and at bedtime.     Budeson-Glycopyrrol-Formoterol (BREZTRI AEROSPHERE) 160-9-4.8 MCG/ACT AERO Inhale 2 puffs into the lungs in the morning and at bedtime. (Patient not taking: Reported on 07/20/2022) 5.9 g 0   buPROPion (WELLBUTRIN SR) 150 MG 12 hr tablet Take 1 tablet by mouth twice daily 180 tablet 3   clindamycin (CLEOCIN T) 1 % lotion Apply 1 Application topically as needed (abcess).  Continuous Blood Gluc Sensor (FREESTYLE LIBRE 2 SENSOR) MISC 1 each by Does not apply route every 14 (fourteen) days. (Patient not taking: Reported on 09/19/2022) 6 each 3   cyclobenzaprine (FLEXERIL) 10 MG tablet Take 10 mg by mouth at bedtime as needed for muscle spasms.     diphenoxylate-atropine (LOMOTIL) 2.5-0.025 MG tablet Take 1 tablet by mouth as needed for diarrhea or loose stools.     famotidine (PEPCID) 40 MG tablet Take 40 mg by mouth 2  (two) times daily.     fenofibrate (TRICOR) 145 MG tablet Take 1 tablet (145 mg total) by mouth daily. 90 tablet 1   fluticasone (FLONASE) 50 MCG/ACT nasal spray Place 2 sprays into both nostrils as needed for allergies.     glucose blood (FREESTYLE PRECISION NEO TEST) test strip Use as instructed to check blood sugar 4X daily 400 each 3   hydrOXYzine (ATARAX) 25 MG tablet Take 50 mg by mouth daily as needed for anxiety (sleep).     hyoscyamine (LEVBID) 0.375 MG 12 hr tablet Take 0.375 mg by mouth as needed for cramping.     ibuprofen (ADVIL) 800 MG tablet Take 800 mg by mouth as needed for moderate pain.     insulin degludec (TRESIBA FLEXTOUCH) 200 UNIT/ML FlexTouch Pen Inject 68 Units into the skin daily. 30 mL 0   insulin lispro (HUMALOG KWIKPEN) 200 UNIT/ML KwikPen Inject 25-35 Units into the skin 3 (three) times daily before meals. (Patient taking differently: Inject 25-45 Units into the skin 3 (three) times daily before meals.) 30 mL 0   mupirocin cream (BACTROBAN) 2 % Apply 1 Application topically 2 (two) times daily. (Patient taking differently: Apply 1 Application topically as needed (abess).) 30 g 0   nystatin (MYCOSTATIN) 100000 UNIT/ML suspension Use as directed 5 mLs (500,000 Units total) in the mouth or throat 4 (four) times daily. 120 mL 0   nystatin (MYCOSTATIN/NYSTOP) powder Apply 1 Application topically 3 (three) times daily. (Patient taking differently: Apply 1 Application topically as needed (irritation).) 45 g 0   ondansetron (ZOFRAN) 4 MG tablet Take 1 tablet (4 mg total) by mouth every 8 (eight) hours as needed for nausea or vomiting. (Patient taking differently: Take 4 mg by mouth as needed for nausea or vomiting.) 20 tablet 0   pantoprazole (PROTONIX) 40 MG tablet Take 40 mg by mouth 2 (two) times daily.     polycarbophil (FIBERCON) 625 MG tablet Take 1,250 mg by mouth in the morning and at bedtime.     Polyvinyl Alcohol-Povidone (REFRESH OP) Place 2-3 drops into both eyes in  the morning and at bedtime.     pregabalin (LYRICA) 75 MG capsule Take 75-150 mg by mouth See admin instructions. Take 75 mg by mouth in the morning and 150 mg by mouth at bedtime     promethazine (PHENERGAN) 12.5 MG tablet Take 12.5 mg by mouth every 6 (six) hours as needed for nausea or vomiting.     Rimegepant Sulfate (NURTEC) 75 MG TBDP Take 75 mg by mouth daily as needed. (Patient taking differently: Take 75 mg by mouth daily as needed (migrains).) 8 tablet 11   sulfamethoxazole-trimethoprim (BACTRIM DS) 800-160 MG tablet Take 1 tablet by mouth 2 (two) times daily. 10 tablet 0   No current facility-administered medications on file prior to visit.    ALLERGIES: Allergies  Allergen Reactions   Metformin And Related Other (See Comments)    GI upset   Metoclopramide Other (See Comments)    headache  Gabapentin Other (See Comments)    hallucination   Keflex [Cephalexin] Rash   Levemir [Insulin Detemir] Dermatitis    Whelps at injection site   Prednisone Palpitations    oral    FAMILY HISTORY: Family History  Problem Relation Age of Onset   Cancer Mother    Diabetes Father    Liver disease Father    Migraines Paternal Aunt    Heart attack Maternal Grandmother    Cancer Paternal Grandfather    Migraines Paternal Aunt       Objective:  *** General: No acute distress.  Patient appears ***-groomed.   Head:  Normocephalic/atraumatic Eyes:  Fundi examined but not visualized Neck: supple, no paraspinal tenderness, full range of motion Heart:  Regular rate and rhythm Lungs:  Clear to auscultation bilaterally Back: No paraspinal tenderness Neurological Exam: alert and oriented.  Speech fluent and not dysarthric, language intact.  CN II-XII intact. Bulk and tone normal, muscle strength 5/5 throughout.  Sensation to light touch intact.  Deep tendon reflexes 2+ throughout, toes downgoing.  Finger to nose testing intact.  Gait normal, Romberg negative.   Shon Millet, DO  CC:  Anne Ng, NP

## 2022-10-21 ENCOUNTER — Other Ambulatory Visit: Payer: Self-pay

## 2022-10-21 MED ORDER — ALBUTEROL SULFATE HFA 108 (90 BASE) MCG/ACT IN AERS: INHALATION_SPRAY | RESPIRATORY_TRACT | 0 refills | Status: AC

## 2022-10-22 ENCOUNTER — Telehealth: Payer: 59 | Admitting: Physician Assistant

## 2022-10-22 DIAGNOSIS — B9689 Other specified bacterial agents as the cause of diseases classified elsewhere: Secondary | ICD-10-CM

## 2022-10-22 DIAGNOSIS — J019 Acute sinusitis, unspecified: Secondary | ICD-10-CM | POA: Diagnosis not present

## 2022-10-24 ENCOUNTER — Ambulatory Visit: Payer: Commercial Managed Care - HMO | Admitting: Neurology

## 2022-10-24 MED ORDER — AMOXICILLIN-POT CLAVULANATE 875-125 MG PO TABS
1.0000 | ORAL_TABLET | Freq: Two times a day (BID) | ORAL | 0 refills | Status: DC
Start: 2022-10-24 — End: 2023-01-04

## 2022-10-24 NOTE — Progress Notes (Signed)

## 2022-11-11 ENCOUNTER — Institutional Professional Consult (permissible substitution) (HOSPITAL_BASED_OUTPATIENT_CLINIC_OR_DEPARTMENT_OTHER): Payer: Commercial Managed Care - HMO | Admitting: Internal Medicine

## 2022-11-22 DIAGNOSIS — M792 Neuralgia and neuritis, unspecified: Secondary | ICD-10-CM | POA: Diagnosis not present

## 2022-11-22 DIAGNOSIS — M47896 Other spondylosis, lumbar region: Secondary | ICD-10-CM | POA: Diagnosis not present

## 2022-11-22 DIAGNOSIS — M545 Low back pain, unspecified: Secondary | ICD-10-CM | POA: Diagnosis not present

## 2022-11-29 DIAGNOSIS — K580 Irritable bowel syndrome with diarrhea: Secondary | ICD-10-CM | POA: Diagnosis not present

## 2022-11-29 DIAGNOSIS — R159 Full incontinence of feces: Secondary | ICD-10-CM | POA: Diagnosis not present

## 2022-11-29 DIAGNOSIS — R140 Abdominal distension (gaseous): Secondary | ICD-10-CM | POA: Diagnosis not present

## 2022-11-29 DIAGNOSIS — R748 Abnormal levels of other serum enzymes: Secondary | ICD-10-CM | POA: Diagnosis not present

## 2022-11-29 DIAGNOSIS — K3184 Gastroparesis: Secondary | ICD-10-CM | POA: Diagnosis not present

## 2022-11-29 DIAGNOSIS — K219 Gastro-esophageal reflux disease without esophagitis: Secondary | ICD-10-CM | POA: Diagnosis not present

## 2022-11-29 DIAGNOSIS — R1319 Other dysphagia: Secondary | ICD-10-CM | POA: Diagnosis not present

## 2022-11-29 DIAGNOSIS — R152 Fecal urgency: Secondary | ICD-10-CM | POA: Diagnosis not present

## 2022-12-07 NOTE — Progress Notes (Deleted)
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 Keep headache diary Follow up for Botox  Subjective:  Kelsey Vang is a 47 year old female with diabetes who follows up for bilateral non-arteritic optic neuropathy and migraines.   UPDATE: Migraines: Botox since August 2023.  Changed acute management from Ubrelvy to Nurtec which is more effective. Intensity:  severe Duration:  *** with Nurtec Frequency:  2-3 times a week Having a tension type headache in back of neck and head daily.  - treats with lidocaine roll on and Flexeril at bedtime (mostly for back) Frequency of abortive medication: Excedrin 3 days a week. Current NSAIDS: ibuprofen 800mg  BID (daily for back) Current analgesics: none Current triptans: None Current ergotamine: None Current anti-emetic: None Current muscle relaxants: Zanflex 2mg  (just prescribed today for back pain) Current anti-anxiolytic: None Current sleep aide: None Current Antihypertensive medications: Metoprolol, lisinopril Current Antidepressant medications: sertraline Current Anticonvulsant medications: None Current anti-CGRP:Nurtec PRN Current Vitamins/Herbal/Supplements: None Current Antihistamines/Decongestants: Flonase, Zyrtec Other therapy: Botox Hormone/birth control: none   Caffeine:  No coffee.  Will drink caffeine with headache Alcohol: No Smoker: No Diet: Hydrates.  Does not skip meals Exercise: Walks Depression: No; Anxiety: No Other pain: back pain  Sleep  hygiene: Poor.  Has OSA and compliant with CPAP   HISTORY: In October 2022, she developed blurred vision followed by darkening vision in the lower half of visual field her left eye.  No associated eye pain but she is on Lyrica for chronic back and leg pain.  She reports intermittent burst of blue and red colors in the left eye, and vision looks red with faded bluish green with eyes closed.  She went to her optometrist last week whose exam revealed left optic neuritis and disc hemorrhage with full visual fields and no APD.  Due to persistent vision loss, she received round of Solu-Medrol for optic neuritis.  No improvement, so she did receive Acthar but also no improvement.  Underwent workup for optic neuritis: MRI of brain and orbits with and without contrast on 02/22/2021 was limited due to motion but revealed only mild nonspecific white matter T2/FLAIR hyperintensities but otherwise no obvious findings of MS or abnormal enhancement of brain and optic nerves.  MRI of cervical and thoracic spine with and without contrast on 02/24/2021 revealed mild spinal and bilateral foraminal stenosis at C6-7 and mild right foraminal narrowing at C4-5 and C5-6 but no spinal cord abnormalities such as demyelination.  She underwent LP on 03/01/2021 for CSF analysis which demonstrated opening pressure of 15.5 cm water, CSF cell count 3, protein 80 (patient diabetic), glucose 94 (patient diabetic), no oligoclonal bands, IgG 3, negative ACE, negative culture, negative cytology, nonreactive VDRL.  Serum labs include negative NMO IgG, negative ANA, sed rate 44, B12 438, ACE 36, vit A 64.  Due to insurance reasons, she was unable to get an ophthalmology appointment until 05/11/2021.  She saw Dr. Dione Booze who actually noted bilateral non-arteritic optic neuropathy (each episode occurred after COVID).  We referred her to sleep medicine and she has  upcoming appointment.  Hgb A1c from Octover was 8.9.   Migraines: Onset: Since late teens.  Increased frequency for past year. Location:  diffuse Quality:  "lightening bolt" Initial intensity:  Severe..  Sometimes wakes her from sleep.  She denies new headache. Aura:  Occasionally may see flashing lights when her eyes are closed.  Difficult to focus Prodrome:  no Postdrome:  tired Associated symptoms: Nausea, vomiting, photophobia, phonophobia, osmophobia, blurred vision.  No autonomic symptoms.  She denies associated unilateral numbness or weakness. Initial duration:  With treatment, 2 hours severe but less severe for entire day (sometimes 2 days). Initial Frequency:  1 to 2 days a week but has constant dull left frontal/top headache Initial Frequency of abortive medication: ibuprofen 3 days a week.  Sumatriptan 1 to 2 days a week. Triggers: None Relieving factors:  rest Activity:  Cannot function at all once every few months.   Past NSAIDS:  no Past analgesics:  Tylenol, Fioricet Past abortive triptans:  Maxalt, sumatriptan - contraindicated (ischemic optic neuropathy) Past muscle relaxants:  Flexeril Past anti-emetic:  no Past antihypertensive medications:  no Past antidepressant medications:   Amitriptyline 100 mg (ineffective, cause palpitations) Past anticonvulsant medications:  Lyrica, topiramate (hallucinations), gabapentin (hallucinations) Past anti-CGRP:  Ajovy (insurance issue), Aimovig 140mg , Bernita Raisin Past vitamins/Herbal/Supplements:  no Past antihistamines/decongestants:  no Other past therapies:  no   Family history of headache:  mother  PAST MEDICAL HISTORY: Past Medical History:  Diagnosis Date   Anxiety    [facility] 06/09/2020   Asthma    Chronic back pain    Depression    [facility] 06/09/2020   Diabetes mellitus without complication (HCC)    Gastroparesis due to DM (HCC)    GERD (gastroesophageal reflux disease)    [facility] 06/09/2020   Hx of migraines    Hyperlipemia    Hyperlipidemia    [facility] 06/09/2020   Hypertension    IBS (irritable  bowel syndrome)    Nonarteritic ischemic optic neuropathy, unspecified laterality    Peripheral neuropathy    Sinusitis, chronic 01/17/2019   Sleep apnea    [facility] 06/09/2020    MEDICATIONS: Current Outpatient Medications on File Prior to Visit  Medication Sig Dispense Refill   acetaminophen (TYLENOL) 500 MG tablet Take 1,000 mg by mouth as needed for moderate pain.     albuterol (VENTOLIN HFA) 108 (90 Base) MCG/ACT inhaler INHALE 2 PUFFS BY MOUTH EVERY 6 HOURS AS NEEDED FOR WHEEZING FOR SHORTNESS OF BREATH 9 g 0   amoxicillin-clavulanate (AUGMENTIN) 875-125 MG tablet Take 1 tablet by mouth 2 (two) times daily. 20 tablet 0   benzonatate (TESSALON) 200 MG capsule Take 1 capsule (200 mg total) by mouth every 8 (eight) hours as needed for cough. (Patient taking differently: Take 200 mg by mouth as needed for cough.) 50 capsule 2   botulinum toxin Type A (BOTOX) 200 units injection Inject 155 Units into the muscle every 3 (three) months. Inject 155 units IM into multiple site in the face,neck and head once every 90 days. 1 each 4   brimonidine (ALPHAGAN) 0.2 % ophthalmic solution Place 1 drop into both eyes in the morning and at bedtime.     buPROPion (WELLBUTRIN SR) 150 MG 12 hr tablet Take 1 tablet by mouth twice daily 180 tablet 3   clindamycin (CLEOCIN T) 1 % lotion Apply 1 Application topically as needed (abcess).     cyclobenzaprine (FLEXERIL) 10 MG tablet Take 10 mg by mouth at bedtime as needed for muscle spasms.  diphenoxylate-atropine (LOMOTIL) 2.5-0.025 MG tablet Take 1 tablet by mouth as needed for diarrhea or loose stools.     famotidine (PEPCID) 40 MG tablet Take 40 mg by mouth 2 (two) times daily.     fenofibrate (TRICOR) 145 MG tablet Take 1 tablet (145 mg total) by mouth daily. 90 tablet 1   fluticasone (FLONASE) 50 MCG/ACT nasal spray Place 2 sprays into both nostrils as needed for allergies.     hydrOXYzine (ATARAX) 25 MG tablet Take 50 mg by mouth daily as needed for  anxiety (sleep).     hyoscyamine (LEVBID) 0.375 MG 12 hr tablet Take 0.375 mg by mouth as needed for cramping.     ibuprofen (ADVIL) 800 MG tablet Take 800 mg by mouth as needed for moderate pain.     mupirocin cream (BACTROBAN) 2 % Apply 1 Application topically 2 (two) times daily. (Patient taking differently: Apply 1 Application topically as needed (abess).) 30 g 0   nystatin (MYCOSTATIN) 100000 UNIT/ML suspension Use as directed 5 mLs (500,000 Units total) in the mouth or throat 4 (four) times daily. 120 mL 0   nystatin (MYCOSTATIN/NYSTOP) powder Apply 1 Application topically 3 (three) times daily. (Patient taking differently: Apply 1 Application topically as needed (irritation).) 45 g 0   ondansetron (ZOFRAN) 4 MG tablet Take 1 tablet (4 mg total) by mouth every 8 (eight) hours as needed for nausea or vomiting. (Patient taking differently: Take 4 mg by mouth as needed for nausea or vomiting.) 20 tablet 0   pantoprazole (PROTONIX) 40 MG tablet Take 40 mg by mouth 2 (two) times daily.     polycarbophil (FIBERCON) 625 MG tablet Take 1,250 mg by mouth in the morning and at bedtime.     Polyvinyl Alcohol-Povidone (REFRESH OP) Place 2-3 drops into both eyes in the morning and at bedtime.     pregabalin (LYRICA) 75 MG capsule Take 75-150 mg by mouth See admin instructions. Take 75 mg by mouth in the morning and 150 mg by mouth at bedtime     promethazine (PHENERGAN) 12.5 MG tablet Take 12.5 mg by mouth every 6 (six) hours as needed for nausea or vomiting.     Rimegepant Sulfate (NURTEC) 75 MG TBDP Take 75 mg by mouth daily as needed. (Patient taking differently: Take 75 mg by mouth daily as needed (migrains).) 8 tablet 11   No current facility-administered medications on file prior to visit.    ALLERGIES: Allergies  Allergen Reactions   Metformin And Related Other (See Comments)    GI upset   Metoclopramide Other (See Comments)    headache   Gabapentin Other (See Comments)    hallucination    Keflex [Cephalexin] Rash   Levemir [Insulin Detemir] Dermatitis    Whelps at injection site   Prednisone Palpitations    oral    FAMILY HISTORY: Family History  Problem Relation Age of Onset   Cancer Mother    Diabetes Father    Liver disease Father    Migraines Paternal Aunt    Heart attack Maternal Grandmother    Cancer Paternal Grandfather    Migraines Paternal Aunt       Objective:  *** General: No acute distress.  Patient appears ***-groomed.   Head:  Normocephalic/atraumatic Eyes:  Fundi examined but not visualized Neck: supple, no paraspinal tenderness, full range of motion Heart:  Regular rate and rhythm Lungs:  Clear to auscultation bilaterally Back: No paraspinal tenderness Neurological Exam: alert and oriented.  Speech fluent and not  dysarthric, language intact.  CN II-XII intact. Bulk and tone normal, muscle strength 5/5 throughout.  Sensation to light touch intact.  Deep tendon reflexes 2+ throughout, toes downgoing.  Finger to nose testing intact.  Gait normal, Romberg negative.   Shon Millet, DO  CC: ***

## 2022-12-09 ENCOUNTER — Ambulatory Visit: Payer: Commercial Managed Care - HMO | Admitting: Neurology

## 2022-12-13 ENCOUNTER — Other Ambulatory Visit (HOSPITAL_COMMUNITY): Payer: Self-pay

## 2022-12-14 ENCOUNTER — Other Ambulatory Visit (HOSPITAL_COMMUNITY): Payer: Self-pay

## 2022-12-15 ENCOUNTER — Telehealth: Payer: Self-pay

## 2022-12-15 ENCOUNTER — Other Ambulatory Visit (HOSPITAL_COMMUNITY): Payer: Self-pay

## 2022-12-15 ENCOUNTER — Encounter: Payer: Self-pay | Admitting: Neurology

## 2022-12-15 ENCOUNTER — Other Ambulatory Visit: Payer: Self-pay

## 2022-12-15 NOTE — Telephone Encounter (Signed)
Per Pharmacy Tech,  Joeseph Amor,, This patient has new insurance and botox is no longer covered under pharmacy benefit. It says to process under medical benefit. Thanks!   Tried calling patient, no answer. LMOVM

## 2022-12-15 NOTE — Telephone Encounter (Signed)
Pharmacy Patient Advocate Encounter   Received notification from Patient Advice Request messages that prior authorization for Botox 200UNIT solution is required/requested.   Insurance verification completed.   The patient is insured through [employer] .   Per test claim: PA required; PA submitted to [employer] via CoverMyMeds Key/confirmation #/EOC [id number] Status is pending

## 2022-12-17 DIAGNOSIS — R748 Abnormal levels of other serum enzymes: Secondary | ICD-10-CM | POA: Diagnosis not present

## 2022-12-21 ENCOUNTER — Other Ambulatory Visit (HOSPITAL_COMMUNITY): Payer: Self-pay

## 2022-12-22 ENCOUNTER — Other Ambulatory Visit (HOSPITAL_COMMUNITY): Payer: Self-pay

## 2022-12-28 ENCOUNTER — Telehealth: Payer: Self-pay | Admitting: Pharmacy Technician

## 2022-12-28 NOTE — Telephone Encounter (Signed)
[employer] verification has been submitted. Benefit Verification:  [id number]  Pharmacy PA has been submitted for BOTOX 200 via FAX. INSURANCE: [employer] DATE SUBMITTED: 9.11.24 FAX: [phone] Status is pending

## 2022-12-29 NOTE — Telephone Encounter (Signed)
Pharmacy Patient Advocate Encounter  Received notification from [employer] that Prior Authorization for Botox 200UNIT solution has been DENIED.  Full denial letter will be uploaded to the media tab. See denial reason below.  The reason for the denial was: *Drug Not Covered/Plan Exclusion - Your request for coverage was denied because your prescription benefit plan does not cover the requested medication.   This decision relates specifically to coverage provided under your prescription benefit plan and does not involve any determination of medical judgment.

## 2022-12-30 ENCOUNTER — Ambulatory Visit: Payer: Medicaid Other | Admitting: Neurology

## 2022-12-30 NOTE — Telephone Encounter (Signed)
Patient advised of Dr.Jaffe note, I would have patient worked in for a follow up appointment to document her progress on Botox and we can use this in our appeal     Appt scheduled 9/18

## 2023-01-02 NOTE — Telephone Encounter (Signed)
Pharmacy Patient Advocate Encounter- Botox BIV-Medical Benefit:  Buy/Bill J code: J8119  PA was submitted to [employer] and has been approved through: 9.12.24 TO 9.11.25 Authorization# [id number]  Please send prescription to Specialty Pharmacy: [facility]: [phone] Estimated Patient cost is: ?

## 2023-01-03 NOTE — Progress Notes (Unsigned)
NEUROLOGY FOLLOW UP OFFICE NOTE  Kelsey Vang [id number]  Assessment/Plan:   Bilateral non-arteritic optic neuropathy - likely due to diabetes Chronic migraine with aura, without status migrainosus, not intractable Obstructive sleep apnea     Migraine prevention:  Botox Migraine rescue:  Will have her try sumatriptan 6mg  [place] to try and abort intractable migraines.  In retrospect, I do not think she has a contraindication to triptans.  She has Nurtec also if needed. Advised to start ASA 81mg  daily and optimize glycemic control and cholesterol management Limit use of pain relievers to no more than 2 days out of week to prevent risk of rebound or medication-overuse headache. Keep headache diary Follow up for Botox  Subjective:  Kelsey Vang is a 47 year old female with diabetes who follows up for bilateral non-arteritic optic neuropathy and migraines.   UPDATE: Migraines: Botox since August 2023.  Maintains over 50% reduction in migraine frequency.  Changed acute management from Ubrelvy to Nurtec which is more effective.  We got prior authorization to continue Botox Intensity:  severe Duration:  sometimes few hours with Nurtec, sometimes days Frequency:  10-12 days a month Having a tension type headache in back of neck and head daily.  - treats with lidocaine roll on and Flexeril at bedtime (mostly for back) Frequency of abortive medication: Excedrin 3 days a week. Current NSAIDS: ibuprofen 800mg  BID (daily for back) Current analgesics: none Current triptans: None Current ergotamine: None Current anti-emetic: promethazine 12.5mg , Zofran 4mg  Current muscle relaxants: Flexeril Current anti-anxiolytic: None Current sleep aide: None Current Antihypertensive medications: Metoprolol, lisinopril Current Antidepressant medications: sertraline Current Anticonvulsant medications: Lyrica  Current anti-CGRP:Nurtec PRN Current Vitamins/Herbal/Supplements: None Current  Antihistamines/Decongestants: Flonase, Zyrtec Other therapy: Botox Hormone/birth control: none   Caffeine:  No coffee.  Will drink caffeine with headache Alcohol: No Smoker: No Diet: Hydrates.  Does not skip meals Exercise: Walks Depression: No; Anxiety: No Other pain: back pain  Sleep hygiene: Poor.  Has OSA and compliant with CPAP   HISTORY: In October 2022, she developed blurred vision followed by darkening vision in the lower half of visual field her left eye.  No associated eye pain but she is on Lyrica for chronic back and leg pain.  She reports intermittent burst of blue and red colors in the left eye, and vision looks red with faded bluish green with eyes closed.  She went to her optometrist last week whose exam revealed left optic neuritis and disc hemorrhage with full visual fields and no APD.  Due to persistent vision loss, she received round of Solu-Medrol for optic neuritis.  No improvement, so she did receive Acthar but also no improvement.  Underwent workup for optic neuritis: MRI of brain and orbits with and without contrast on 02/22/2021 was limited due to motion but revealed only mild nonspecific white matter T2/FLAIR hyperintensities but otherwise no obvious findings of MS or abnormal enhancement of brain and optic nerves.  MRI of cervical and thoracic spine with and without contrast on 02/24/2021 revealed mild spinal and bilateral foraminal stenosis at C6-7 and mild right foraminal narrowing at C4-5 and C5-6 but no spinal cord abnormalities such as demyelination.  She underwent LP on 03/01/2021 for CSF analysis which demonstrated opening pressure of 15.5 cm water, CSF cell count 3, protein 80 (patient diabetic), glucose 94 (patient diabetic), no oligoclonal bands, IgG 3, negative ACE, negative culture, negative cytology, nonreactive VDRL.  Serum labs include negative NMO IgG, negative ANA, sed rate 44, B12 438,  ACE 36, vit A 64.  Due to insurance reasons, she was unable to get an  ophthalmology appointment until 05/11/2021.  She saw Dr. Dione Booze who actually noted bilateral non-arteritic optic neuropathy (each episode occurred after COVID).  We referred her to sleep medicine and she has upcoming appointment.  Hgb A1c from Octover was 8.9.   Migraines: Onset: Since late teens. Increased frequency for past year. Location:  diffuse Quality:  "lightening bolt" Initial intensity:  Severe..  Sometimes wakes her from sleep.  She denies new headache. Aura:  Occasionally may see flashing lights when her eyes are closed.  Difficult to focus Prodrome:  no Postdrome:  tired Associated symptoms: Nausea, vomiting, photophobia, phonophobia, osmophobia, blurred vision.  No autonomic symptoms.  She denies associated unilateral numbness or weakness. Initial duration:  With treatment, 2 hours severe but less severe for entire day (sometimes 2 days). Initial Frequency:  1 to 2 days a week but has constant dull left frontal/top headache Initial Frequency of abortive medication: ibuprofen 3 days a week.  Sumatriptan 1 to 2 days a week. Triggers: None Relieving factors:  rest Activity:  Cannot function at all once every few months.   Past NSAIDS:  no Past analgesics:  Tylenol, Fioricet Past abortive triptans:  Maxalt, sumatriptan  Past muscle relaxants:  Tizanidine Past anti-emetic:  no Past antihypertensive medications:  no Past antidepressant medications:   Amitriptyline 100 mg (ineffective, cause palpitations) Past anticonvulsant medications:  Lyrica, topiramate (hallucinations), gabapentin (hallucinations) Past anti-CGRP:  Ajovy (insurance issue), Aimovig 140mg , Bernita Raisin Past vitamins/Herbal/Supplements:  no Past antihistamines/decongestants:  no Other past therapies:  no   Family history of headache:  mother  PAST MEDICAL HISTORY: Past Medical History:  Diagnosis Date   Anxiety    [facility] 06/09/2020   Asthma    Chronic back pain    Depression    [facility] 06/09/2020    Diabetes mellitus without complication (HCC)    Gastroparesis due to DM (HCC)    GERD (gastroesophageal reflux disease)    [facility] 06/09/2020   Hx of migraines    Hyperlipemia    Hyperlipidemia    [facility] 06/09/2020   Hypertension    IBS (irritable bowel syndrome)    Nonarteritic ischemic optic neuropathy, unspecified laterality    Peripheral neuropathy    Sinusitis, chronic 01/17/2019   Sleep apnea    [facility] 06/09/2020    MEDICATIONS: Current Outpatient Medications on File Prior to Visit  Medication Sig Dispense Refill   acetaminophen (TYLENOL) 500 MG tablet Take 1,000 mg by mouth as needed for moderate pain.     albuterol (VENTOLIN HFA) 108 (90 Base) MCG/ACT inhaler INHALE 2 PUFFS BY MOUTH EVERY 6 HOURS AS NEEDED FOR WHEEZING FOR SHORTNESS OF BREATH 9 g 0   amoxicillin-clavulanate (AUGMENTIN) 875-125 MG tablet Take 1 tablet by mouth 2 (two) times daily. 20 tablet 0   benzonatate (TESSALON) 200 MG capsule Take 1 capsule (200 mg total) by mouth every 8 (eight) hours as needed for cough. (Patient taking differently: Take 200 mg by mouth as needed for cough.) 50 capsule 2   botulinum toxin Type A (BOTOX) 200 units injection Inject 155 Units into the muscle every 3 (three) months. Inject 155 units IM into multiple site in the face,neck and head once every 90 days. 1 each 4   brimonidine (ALPHAGAN) 0.2 % ophthalmic solution Place 1 drop into both eyes in the morning and at bedtime.     buPROPion (WELLBUTRIN SR) 150 MG 12 hr tablet Take 1 tablet  by mouth twice daily 180 tablet 3   clindamycin (CLEOCIN T) 1 % lotion Apply 1 Application topically as needed (abcess).     cyclobenzaprine (FLEXERIL) 10 MG tablet Take 10 mg by mouth at bedtime as needed for muscle spasms.     diphenoxylate-atropine (LOMOTIL) 2.5-0.025 MG tablet Take 1 tablet by mouth as needed for diarrhea or loose stools.     famotidine (PEPCID) 40 MG tablet Take 40 mg by mouth 2 (two) times daily.     fenofibrate (TRICOR)  145 MG tablet Take 1 tablet (145 mg total) by mouth daily. 90 tablet 1   fluticasone (FLONASE) 50 MCG/ACT nasal spray Place 2 sprays into both nostrils as needed for allergies.     hydrOXYzine (ATARAX) 25 MG tablet Take 50 mg by mouth daily as needed for anxiety (sleep).     hyoscyamine (LEVBID) 0.375 MG 12 hr tablet Take 0.375 mg by mouth as needed for cramping.     ibuprofen (ADVIL) 800 MG tablet Take 800 mg by mouth as needed for moderate pain.     mupirocin cream (BACTROBAN) 2 % Apply 1 Application topically 2 (two) times daily. (Patient taking differently: Apply 1 Application topically as needed (abess).) 30 g 0   nystatin (MYCOSTATIN) 100000 UNIT/ML suspension Use as directed 5 mLs (500,000 Units total) in the mouth or throat 4 (four) times daily. 120 mL 0   nystatin (MYCOSTATIN/NYSTOP) powder Apply 1 Application topically 3 (three) times daily. (Patient taking differently: Apply 1 Application topically as needed (irritation).) 45 g 0   ondansetron (ZOFRAN) 4 MG tablet Take 1 tablet (4 mg total) by mouth every 8 (eight) hours as needed for nausea or vomiting. (Patient taking differently: Take 4 mg by mouth as needed for nausea or vomiting.) 20 tablet 0   pantoprazole (PROTONIX) 40 MG tablet Take 40 mg by mouth 2 (two) times daily.     polycarbophil (FIBERCON) 625 MG tablet Take 1,250 mg by mouth in the morning and at bedtime.     Polyvinyl Alcohol-Povidone (REFRESH OP) Place 2-3 drops into both eyes in the morning and at bedtime.     pregabalin (LYRICA) 75 MG capsule Take 75-150 mg by mouth See admin instructions. Take 75 mg by mouth in the morning and 150 mg by mouth at bedtime     promethazine (PHENERGAN) 12.5 MG tablet Take 12.5 mg by mouth every 6 (six) hours as needed for nausea or vomiting.     Rimegepant Sulfate (NURTEC) 75 MG TBDP Take 75 mg by mouth daily as needed. (Patient taking differently: Take 75 mg by mouth daily as needed (migrains).) 8 tablet 11   No current  facility-administered medications on file prior to visit.    ALLERGIES: Allergies  Allergen Reactions   Metformin And Related Other (See Comments)    GI upset   Metoclopramide Other (See Comments)    headache   Gabapentin Other (See Comments)    hallucination   Keflex [Cephalexin] Rash   Levemir [Insulin Detemir] Dermatitis    Whelps at injection site   Prednisone Palpitations    oral    FAMILY HISTORY: Family History  Problem Relation Age of Onset   Cancer Mother    Diabetes Father    Liver disease Father    Migraines Paternal Aunt    Heart attack Maternal Grandmother    Cancer Paternal Grandfather    Migraines Paternal Aunt       Objective:  Blood pressure 117/79, pulse (!) 104, height 5' (1.524  m), weight 281 lb (127.5 kg), last menstrual period 11/01/2017, SpO2 99%. General: No acute distress.  Patient appears well-groomed.   Head:  Normocephalic/atraumatic   Shon Millet, DO  CC: Anne Ng, NP

## 2023-01-04 ENCOUNTER — Encounter: Payer: Self-pay | Admitting: Neurology

## 2023-01-04 ENCOUNTER — Ambulatory Visit: Payer: 59 | Admitting: Neurology

## 2023-01-04 ENCOUNTER — Other Ambulatory Visit (HOSPITAL_COMMUNITY): Payer: Self-pay

## 2023-01-04 VITALS — BP 117/79 | HR 104 | Ht 60.0 in | Wt 281.0 lb

## 2023-01-04 DIAGNOSIS — H47013 Ischemic optic neuropathy, bilateral: Secondary | ICD-10-CM | POA: Diagnosis not present

## 2023-01-04 DIAGNOSIS — G43719 Chronic migraine without aura, intractable, without status migrainosus: Secondary | ICD-10-CM

## 2023-01-04 MED ORDER — SUMATRIPTAN SUCCINATE 6 MG/0.5ML ~~LOC~~ SOAJ: 0.5000 mL | SUBCUTANEOUS | 11 refills | Status: DC | PRN

## 2023-01-04 NOTE — Patient Instructions (Addendum)
Plan for Botox  Take sumatriptan injection as needed.  May repeat after 1 hour.  Maximum 2 doses in 24 hours.   May use Nurtec as well

## 2023-01-10 ENCOUNTER — Encounter: Payer: Self-pay | Admitting: Neurology

## 2023-01-16 ENCOUNTER — Other Ambulatory Visit: Payer: Self-pay | Admitting: Neurology

## 2023-01-16 MED ORDER — SUMATRIPTAN SUCCINATE 6 MG/0.5ML ~~LOC~~ SOLN: 6.0000 mg | SUBCUTANEOUS | 11 refills | Status: DC | PRN

## 2023-01-25 DIAGNOSIS — E1165 Type 2 diabetes mellitus with hyperglycemia: Secondary | ICD-10-CM | POA: Diagnosis not present

## 2023-01-27 ENCOUNTER — Ambulatory Visit: Payer: Medicaid Other | Admitting: Neurology

## 2023-01-27 ENCOUNTER — Ambulatory Visit: Payer: 59 | Admitting: Neurology

## 2023-01-27 DIAGNOSIS — G43709 Chronic migraine without aura, not intractable, without status migrainosus: Secondary | ICD-10-CM | POA: Diagnosis not present

## 2023-01-27 MED ORDER — NEEDLES & SYRINGES MISC: 5 refills | Status: AC

## 2023-01-27 MED ORDER — SUMATRIPTAN SUCCINATE 6 MG/0.5ML ~~LOC~~ SOLN: 6.0000 mg | SUBCUTANEOUS | 11 refills | Status: AC | PRN

## 2023-01-27 MED ORDER — NEEDLES & SYRINGES MISC: 5 refills | Status: DC

## 2023-01-27 MED ORDER — TIZANIDINE HCL 2 MG PO TABS: 2.0000 mg | ORAL_TABLET | Freq: Three times a day (TID) | ORAL | 1 refills | Status: DC

## 2023-01-27 MED ORDER — ONABOTULINUMTOXINA 100 UNITS IJ SOLR
200.0000 [IU] | Freq: Once | INTRAMUSCULAR | Status: AC
Start: 2023-01-27 — End: 2023-01-27
  Administered 2023-01-27: 155 [IU] via INTRAMUSCULAR

## 2023-01-27 NOTE — Progress Notes (Signed)

## 2023-02-07 ENCOUNTER — Telehealth: Payer: Self-pay | Admitting: Pharmacy Technician

## 2023-02-07 ENCOUNTER — Other Ambulatory Visit (HOSPITAL_COMMUNITY): Payer: Self-pay

## 2023-02-07 NOTE — Telephone Encounter (Signed)
Pharmacy Patient Advocate Encounter   Received notification from CoverMyMeds that prior authorization for NURTEC 75MG  is required/requested.   Insurance verification completed.   The patient is insured through [facility] .   Per test claim: PA required; PA submitted to [facility] via CoverMyMeds Key/confirmation #/EOC [id number] Status is pending

## 2023-02-09 NOTE — Telephone Encounter (Signed)
Pharmacy Patient Advocate Encounter  Received notification from [facility] that Prior Authorization for Nurtec 75MG  dispersible tablets has been APPROVED from 02-07-2023 to 02-07-2024   PA #/Case ID/Reference #: [id number]

## 2023-02-13 NOTE — Telephone Encounter (Signed)
Patient advised Vi mychart message

## 2023-02-24 DIAGNOSIS — Z7982 Long term (current) use of aspirin: Secondary | ICD-10-CM | POA: Diagnosis not present

## 2023-02-24 DIAGNOSIS — R195 Other fecal abnormalities: Secondary | ICD-10-CM | POA: Diagnosis not present

## 2023-02-24 DIAGNOSIS — I129 Hypertensive chronic kidney disease with stage 1 through stage 4 chronic kidney disease, or unspecified chronic kidney disease: Secondary | ICD-10-CM | POA: Diagnosis not present

## 2023-02-24 DIAGNOSIS — Z7984 Long term (current) use of oral hypoglycemic drugs: Secondary | ICD-10-CM | POA: Diagnosis not present

## 2023-02-24 DIAGNOSIS — E1122 Type 2 diabetes mellitus with diabetic chronic kidney disease: Secondary | ICD-10-CM | POA: Diagnosis not present

## 2023-02-24 DIAGNOSIS — N189 Chronic kidney disease, unspecified: Secondary | ICD-10-CM | POA: Diagnosis not present

## 2023-02-24 DIAGNOSIS — Z791 Long term (current) use of non-steroidal anti-inflammatories (NSAID): Secondary | ICD-10-CM | POA: Diagnosis not present

## 2023-02-24 DIAGNOSIS — K219 Gastro-esophageal reflux disease without esophagitis: Secondary | ICD-10-CM | POA: Diagnosis not present

## 2023-02-24 DIAGNOSIS — G43709 Chronic migraine without aura, not intractable, without status migrainosus: Secondary | ICD-10-CM | POA: Diagnosis not present

## 2023-02-24 DIAGNOSIS — Z794 Long term (current) use of insulin: Secondary | ICD-10-CM | POA: Diagnosis not present

## 2023-02-24 DIAGNOSIS — G4733 Obstructive sleep apnea (adult) (pediatric): Secondary | ICD-10-CM | POA: Diagnosis not present

## 2023-02-24 DIAGNOSIS — K580 Irritable bowel syndrome with diarrhea: Secondary | ICD-10-CM | POA: Diagnosis not present

## 2023-02-24 DIAGNOSIS — Z79899 Other long term (current) drug therapy: Secondary | ICD-10-CM | POA: Diagnosis not present

## 2023-03-07 DIAGNOSIS — Z794 Long term (current) use of insulin: Secondary | ICD-10-CM | POA: Diagnosis not present

## 2023-03-07 DIAGNOSIS — H0288B Meibomian gland dysfunction left eye, upper and lower eyelids: Secondary | ICD-10-CM | POA: Diagnosis not present

## 2023-03-07 DIAGNOSIS — H2513 Age-related nuclear cataract, bilateral: Secondary | ICD-10-CM | POA: Diagnosis not present

## 2023-03-07 DIAGNOSIS — E119 Type 2 diabetes mellitus without complications: Secondary | ICD-10-CM | POA: Diagnosis not present

## 2023-03-07 DIAGNOSIS — H47013 Ischemic optic neuropathy, bilateral: Secondary | ICD-10-CM | POA: Diagnosis not present

## 2023-03-07 DIAGNOSIS — H0288A Meibomian gland dysfunction right eye, upper and lower eyelids: Secondary | ICD-10-CM | POA: Diagnosis not present

## 2023-03-07 LAB — HM DIABETES EYE EXAM

## 2023-03-11 DIAGNOSIS — R195 Other fecal abnormalities: Secondary | ICD-10-CM | POA: Diagnosis not present

## 2023-03-31 ENCOUNTER — Ambulatory Visit: Payer: 59 | Admitting: Neurology

## 2023-04-07 DIAGNOSIS — M792 Neuralgia and neuritis, unspecified: Secondary | ICD-10-CM | POA: Diagnosis not present

## 2023-04-07 DIAGNOSIS — M47896 Other spondylosis, lumbar region: Secondary | ICD-10-CM | POA: Diagnosis not present

## 2023-04-17 ENCOUNTER — Other Ambulatory Visit: Payer: Self-pay | Admitting: Neurology

## 2023-04-26 DIAGNOSIS — E782 Mixed hyperlipidemia: Secondary | ICD-10-CM | POA: Diagnosis not present

## 2023-04-26 DIAGNOSIS — Z7982 Long term (current) use of aspirin: Secondary | ICD-10-CM | POA: Diagnosis not present

## 2023-04-26 DIAGNOSIS — K219 Gastro-esophageal reflux disease without esophagitis: Secondary | ICD-10-CM | POA: Diagnosis not present

## 2023-04-26 DIAGNOSIS — Z6841 Body Mass Index (BMI) 40.0 and over, adult: Secondary | ICD-10-CM | POA: Diagnosis not present

## 2023-04-26 DIAGNOSIS — I129 Hypertensive chronic kidney disease with stage 1 through stage 4 chronic kidney disease, or unspecified chronic kidney disease: Secondary | ICD-10-CM | POA: Diagnosis not present

## 2023-04-26 DIAGNOSIS — Z794 Long term (current) use of insulin: Secondary | ICD-10-CM | POA: Diagnosis not present

## 2023-04-26 DIAGNOSIS — K3184 Gastroparesis: Secondary | ICD-10-CM | POA: Diagnosis not present

## 2023-04-26 DIAGNOSIS — K208 Other esophagitis without bleeding: Secondary | ICD-10-CM | POA: Diagnosis not present

## 2023-04-26 DIAGNOSIS — N189 Chronic kidney disease, unspecified: Secondary | ICD-10-CM | POA: Diagnosis not present

## 2023-04-26 DIAGNOSIS — F32A Depression, unspecified: Secondary | ICD-10-CM | POA: Diagnosis not present

## 2023-04-26 DIAGNOSIS — E1122 Type 2 diabetes mellitus with diabetic chronic kidney disease: Secondary | ICD-10-CM | POA: Diagnosis not present

## 2023-04-26 DIAGNOSIS — G4733 Obstructive sleep apnea (adult) (pediatric): Secondary | ICD-10-CM | POA: Diagnosis not present

## 2023-04-26 DIAGNOSIS — Z860100 Personal history of colon polyps, unspecified: Secondary | ICD-10-CM | POA: Diagnosis not present

## 2023-04-26 DIAGNOSIS — Z79899 Other long term (current) drug therapy: Secondary | ICD-10-CM | POA: Diagnosis not present

## 2023-04-29 DIAGNOSIS — Z1231 Encounter for screening mammogram for malignant neoplasm of breast: Secondary | ICD-10-CM | POA: Diagnosis not present

## 2023-04-29 DIAGNOSIS — R051 Acute cough: Secondary | ICD-10-CM | POA: Diagnosis not present

## 2023-04-29 DIAGNOSIS — J0100 Acute maxillary sinusitis, unspecified: Secondary | ICD-10-CM | POA: Diagnosis not present

## 2023-04-29 DIAGNOSIS — R0982 Postnasal drip: Secondary | ICD-10-CM | POA: Diagnosis not present

## 2023-05-04 DIAGNOSIS — K3184 Gastroparesis: Secondary | ICD-10-CM | POA: Diagnosis not present

## 2023-05-04 DIAGNOSIS — E1143 Type 2 diabetes mellitus with diabetic autonomic (poly)neuropathy: Secondary | ICD-10-CM | POA: Diagnosis not present

## 2023-05-04 DIAGNOSIS — R140 Abdominal distension (gaseous): Secondary | ICD-10-CM | POA: Diagnosis not present

## 2023-05-04 DIAGNOSIS — K580 Irritable bowel syndrome with diarrhea: Secondary | ICD-10-CM | POA: Diagnosis not present

## 2023-05-04 DIAGNOSIS — K219 Gastro-esophageal reflux disease without esophagitis: Secondary | ICD-10-CM | POA: Diagnosis not present

## 2023-05-04 DIAGNOSIS — R1319 Other dysphagia: Secondary | ICD-10-CM | POA: Diagnosis not present

## 2023-05-04 DIAGNOSIS — R159 Full incontinence of feces: Secondary | ICD-10-CM | POA: Diagnosis not present

## 2023-05-04 DIAGNOSIS — R1310 Dysphagia, unspecified: Secondary | ICD-10-CM | POA: Diagnosis not present

## 2023-05-04 DIAGNOSIS — R152 Fecal urgency: Secondary | ICD-10-CM | POA: Diagnosis not present

## 2023-05-05 ENCOUNTER — Ambulatory Visit: Payer: 59 | Admitting: Neurology

## 2023-05-05 DIAGNOSIS — G43709 Chronic migraine without aura, not intractable, without status migrainosus: Secondary | ICD-10-CM | POA: Diagnosis not present

## 2023-05-05 MED ORDER — ONABOTULINUMTOXINA 100 UNITS IJ SOLR
200.0000 [IU] | Freq: Once | INTRAMUSCULAR | Status: AC
Start: 2023-05-05 — End: 2023-05-05
  Administered 2023-05-05: 155 [IU] via INTRAMUSCULAR

## 2023-05-05 NOTE — Progress Notes (Signed)
[facility]um Clinic  ° °Procedure Note Botox ° °Attending: Dr.   ° °Preoperative Diagnosis(es): Chronic migraine ° °Consent obtained from: The patient °Benefits discussed included, but were not limited to decreased muscle tightness, increased joint range of motion, and decreased pain.  Risk discussed included, but were not limited pain and discomfort, bleeding, bruising, excessive weakness, venous thrombosis, muscle atrophy and dysphagia.  Anticipated outcomes of the procedure as well as he risks and benefits of the alternatives to the procedure, and the roles and tasks of the personnel to be involved, were discussed with the patient, and the patient consents to the procedure and agrees to proceed. A copy of the patient medication guide was given to the patient which explains the blackbox warning. ° °Patients identity and treatment sites confirmed Yes.  . ° °Details of Procedure: °Skin was cleaned with alcohol. Prior to injection, the needle plunger was aspirated to make sure the needle was not within a blood vessel.  There was no blood retrieved on aspiration.   ° °Following is a summary of the muscles injected  And the amount of Botulinum toxin used: ° °Dilution °200 units of Botox was reconstituted with 4 ml of preservative free normal saline. °Time of reconstitution: At the time of the office visit (<30 minutes prior to injection)  ° °Injections  °155 total units of Botox was injected with a 30 gauge needle. ° °Injection Sites: °L occipitalis: 15 units- 3 sites  °R occiptalis: 15 units- 3 sites ° °L upper trapezius: 15 units- 3 sites °R upper trapezius: 15 units- 3 sits          °L paraspinal: 10 units- 2 sites °R paraspinal: 10 units- 2 sites ° °Face °L frontalis(2 injection sites):10 units   °R frontalis(2 injection sites):10 units         °L corrugator: 5 units   °R corrugator: 5 units           °Procerus: 5 units   °L temporalis: 20 units °R temporalis: 20 units  ° °Agent:  °200 units of botulinum Type  A (Onobotulinum Toxin type A) was reconstituted with 4 ml of preservative free normal saline.  °Time of reconstitution: At the time of the office visit (<30 minutes prior to injection)  ° ° ° Total injected (Units):  155 ° Total wasted (Units):  45 ° °Patient tolerated procedure well without complications.   °Reinjection is anticipated in 3 months. ° ° °

## 2023-05-18 ENCOUNTER — Other Ambulatory Visit: Payer: Self-pay | Admitting: Neurology

## 2023-05-25 DIAGNOSIS — M7702 Medial epicondylitis, left elbow: Secondary | ICD-10-CM | POA: Diagnosis not present

## 2023-05-25 DIAGNOSIS — M65332 Trigger finger, left middle finger: Secondary | ICD-10-CM | POA: Diagnosis not present

## 2023-06-02 ENCOUNTER — Ambulatory Visit: Payer: 59 | Admitting: Neurology

## 2023-06-06 DIAGNOSIS — E1143 Type 2 diabetes mellitus with diabetic autonomic (poly)neuropathy: Secondary | ICD-10-CM | POA: Diagnosis not present

## 2023-06-06 DIAGNOSIS — K3184 Gastroparesis: Secondary | ICD-10-CM | POA: Diagnosis not present

## 2023-06-06 DIAGNOSIS — R1319 Other dysphagia: Secondary | ICD-10-CM | POA: Diagnosis not present

## 2023-06-06 DIAGNOSIS — E119 Type 2 diabetes mellitus without complications: Secondary | ICD-10-CM | POA: Diagnosis not present

## 2023-06-06 DIAGNOSIS — K219 Gastro-esophageal reflux disease without esophagitis: Secondary | ICD-10-CM | POA: Diagnosis not present

## 2023-07-06 ENCOUNTER — Other Ambulatory Visit (HOSPITAL_COMMUNITY): Payer: Self-pay

## 2023-07-24 DIAGNOSIS — H47013 Ischemic optic neuropathy, bilateral: Secondary | ICD-10-CM | POA: Diagnosis not present

## 2023-07-24 DIAGNOSIS — H0288B Meibomian gland dysfunction left eye, upper and lower eyelids: Secondary | ICD-10-CM | POA: Diagnosis not present

## 2023-07-24 DIAGNOSIS — H2513 Age-related nuclear cataract, bilateral: Secondary | ICD-10-CM | POA: Diagnosis not present

## 2023-07-24 DIAGNOSIS — H0288A Meibomian gland dysfunction right eye, upper and lower eyelids: Secondary | ICD-10-CM | POA: Diagnosis not present

## 2023-08-11 ENCOUNTER — Ambulatory Visit: Payer: 59 | Admitting: Neurology

## 2023-09-06 ENCOUNTER — Telehealth: Payer: Self-pay | Admitting: Pharmacy Technician

## 2023-09-06 ENCOUNTER — Other Ambulatory Visit (HOSPITAL_COMMUNITY): Payer: Self-pay

## 2023-09-06 NOTE — Telephone Encounter (Signed)
 Pharmacy Patient Advocate Encounter  Received notification from [facility] that Prior Authorization for NURTEC 75MG  has been APPROVED from 5.21.25 to 10.22.25. Unable to obtain price due to refill too soon rejection, last fill date 5.19.25 next available fill date 6.10.25

## 2023-09-06 NOTE — Telephone Encounter (Signed)
 Pharmacy Patient Advocate Encounter   Received notification from CoverMyMeds that prior authorization for NURTEC 75MG  is required/requested.   Insurance verification completed.   The patient is insured through [facility] .   Per test claim: PA required; PA submitted to above mentioned insurance via CoverMyMeds Key/confirmation #/EOC [id number] Status is pending

## 2023-09-26 DIAGNOSIS — M62838 Other muscle spasm: Secondary | ICD-10-CM | POA: Diagnosis not present

## 2023-09-26 DIAGNOSIS — E1165 Type 2 diabetes mellitus with hyperglycemia: Secondary | ICD-10-CM | POA: Diagnosis not present

## 2023-10-03 ENCOUNTER — Other Ambulatory Visit (HOSPITAL_COMMUNITY): Payer: Self-pay

## 2023-10-03 ENCOUNTER — Telehealth: Payer: Self-pay

## 2023-10-03 NOTE — Telephone Encounter (Signed)
 Patient insurance changed.  PA request has been Submitted. New Encounter has been or will be created for follow up. For additional info see Pharmacy Prior Auth telephone encounter from 10-03-2023.

## 2023-10-03 NOTE — Telephone Encounter (Signed)
 Pharmacy Patient Advocate Encounter   Received notification from Pt Calls Messages that prior authorization for Nurtec 75MG  dispersible tablets is required/requested.   Insurance verification completed.   The patient is insured through [facility].   Per test claim: PA required; PA submitted to above mentioned insurance via CoverMyMeds Key/confirmation #/EOC [id number] Status is pending

## 2023-10-03 NOTE — Telephone Encounter (Signed)
 Letter received today from [facility].  A temporary supply of Nurtec was given to your patient.    Message for Reason, The medication is Formulary but it requires a PA.

## 2023-10-04 NOTE — Telephone Encounter (Signed)
 Pharmacy Patient Advocate Encounter  Received notification from [facility] that Prior Authorization for Nurtec 75MG  dispersible tablets has been APPROVED from 09-17-2023 to 04-17-2024   PA #/Case ID/Reference #: [id number]

## 2023-10-06 ENCOUNTER — Other Ambulatory Visit: Payer: Self-pay | Admitting: Neurology

## 2023-10-09 DIAGNOSIS — K3184 Gastroparesis: Secondary | ICD-10-CM | POA: Diagnosis not present

## 2023-10-13 NOTE — Progress Notes (Deleted)
 NEUROLOGY FOLLOW UP OFFICE NOTE  Kelsey Vang [id number]  Assessment/Plan:   Chronic migraine with aura, without status migrainosus, not intractable Bilateral non-arteritic optic neuropathy - likely due to diabetes Obstructive sleep apnea     Migraine prevention:  Botox  Migraine rescue:  Sumatriptan  6mg  [place], Nurtec *** Limit use of pain relievers to no more than 9 days out of the month to prevent risk of rebound or medication-overuse headache. Keep headache diary CPAP Lifestyle modification Follow up routine office visit in one year.  Subjective:  Kelsey Vang is a 48 year old female with diabetes who follows up for bilateral non-arteritic optic neuropathy and migraines.   UPDATE: Migraines: *** Intensity:  severe Duration:  *** sumatriptan  6mg  [place] syringe, Nurtec *** Frequency:  10-12 days a month  Tension-type headache: Having a tension type headache in .  - treats with lidocaine  roll on and Flexeril  at bedtime (mostly for back) Frequency of abortive medication: Excedrin 3 days a week. Current NSAIDS: ibuprofen 800mg  BID (daily for back), ASA 81mg  daily Current analgesics: none Current triptans: sumatriptan  6mg  syringe Current ergotamine: None Current anti-emetic: promethazine 12.5mg , Zofran  4mg  Current muscle relaxants: Flexeril  Current anti-anxiolytic: None Current sleep aide: None Current Antihypertensive medications: Metoprolol , lisinopril  Current Antidepressant medications: sertraline  Current Anticonvulsant medications: Lyrica   Current anti-CGRP:Nurtec PRN Current Vitamins/Herbal/Supplements: None Current Antihistamines/Decongestants: Flonase , Zyrtec Other therapy: Botox  (since August 2023) Hormone/birth control: none   Caffeine:  No coffee.  Will drink caffeine with headache Alcohol: No Smoker: No Diet: Hydrates.  Does not skip meals Exercise: Walks Depression: No; Anxiety: No Other pain: back pain  Sleep hygiene: Poor.  Has OSA and compliant with  CPAP   HISTORY: Bilateral non-arteritic optic neuropathy: In October 2022, she developed blurred vision followed by darkening vision in the lower half of visual field her left eye.  No associated eye pain but she is on Lyrica  for chronic back and leg pain.  She reports intermittent burst of blue and red colors in the left eye, and vision looks red with faded bluish green with eyes closed.  She went to her optometrist last week whose exam revealed left optic neuritis and disc hemorrhage with full visual fields and no APD.  Due to persistent vision loss, she received round of Solu-Medrol  for optic neuritis.  No improvement, so she did receive Acthar  but also no improvement.  Underwent workup for optic neuritis:  MRI of brain and orbits with and without contrast on 02/22/2021 was limited due to motion but revealed only mild nonspecific white matter T2/FLAIR hyperintensities but otherwise no obvious findings of MS or abnormal enhancement of brain and optic nerves.  MRI of cervical and thoracic spine with and without contrast on 02/24/2021 revealed mild spinal and bilateral foraminal stenosis at C6-7 and mild right foraminal narrowing at C4-5 and C5-6 but no spinal cord abnormalities such as demyelination.  She underwent LP on 03/01/2021 for CSF analysis which demonstrated opening pressure of 15.5 cm water, CSF cell count 3, protein 80 (patient diabetic), glucose 94 (patient diabetic), no oligoclonal bands, IgG 3, negative ACE, negative culture, negative cytology, nonreactive VDRL.  Serum labs include negative NMO IgG, negative ANA, sed rate 44, B12 438, ACE 36, vit A 64.  Due to insurance reasons, she was unable to get an ophthalmology appointment until 05/11/2021.  She saw Dr. Octavia who actually noted bilateral non-arteritic optic neuropathy (each episode occurred after COVID).  We referred her to sleep medicine and she has upcoming appointment.  Hgb A1c from  [place] was 8.9.   Migraines: Onset: Since late teens.  Increased frequency for past year. Location:  diffuse Quality:  lightening bolt Initial intensity:  Severe..  Sometimes wakes her from sleep.  She denies new headache. Aura:  Occasionally may see flashing lights when her eyes are closed.  Difficult to focus Prodrome:  no Postdrome:  tired Associated symptoms: Nausea, vomiting, photophobia, phonophobia, osmophobia, blurred vision.  No autonomic symptoms.  She denies associated unilateral numbness or weakness. Initial duration:  With treatment, 2 hours severe but less severe for entire day (sometimes 2 days). Initial Frequency:  1 to 2 days a week but has constant dull left frontal/top headache Initial Frequency of abortive medication: ibuprofen 3 days a week.  Sumatriptan  1 to 2 days a week. Triggers: None Relieving factors:  rest Activity:  Cannot function at all once every few months.  Tension-type headache: back of neck and head daily   Past NSAIDS:  no Past analgesics:  Tylenol , Fioricet Past abortive triptans:  Maxalt, sumatriptan   Past muscle relaxants:  Tizanidine  Past anti-emetic:  no Past antihypertensive medications:  no Past antidepressant medications:   Amitriptyline  100 mg (ineffective, cause palpitations) Past anticonvulsant medications:  Lyrica , topiramate (hallucinations), gabapentin (hallucinations) Past anti-CGRP:  Ajovy (insurance issue), Aimovig  140mg , Ubrelvy  Past vitamins/Herbal/Supplements:  no Past antihistamines/decongestants:  no Other past therapies:  no   Family history of headache:  mother  PAST MEDICAL HISTORY: Past Medical History:  Diagnosis Date   Anxiety    [employer] 06/09/2020   Asthma    Chronic back pain    Depression    [employer] 06/09/2020   Diabetes mellitus without complication (HCC)    Gastroparesis due to DM (HCC)    GERD (gastroesophageal reflux disease)    [employer] 06/09/2020   Hx of migraines    Hyperlipemia    Hyperlipidemia    [employer] 06/09/2020   Hypertension    IBS  (irritable bowel syndrome)    Nonarteritic ischemic optic neuropathy, unspecified laterality    Peripheral neuropathy    Sinusitis, chronic 01/17/2019   Sleep apnea    [employer] 06/09/2020    MEDICATIONS: Current Outpatient Medications on File Prior to Visit  Medication Sig Dispense Refill   acetaminophen  (TYLENOL ) 500 MG tablet Take 1,000 mg by mouth as needed for moderate pain.     albuterol  (VENTOLIN  HFA) 108 (90 Base) MCG/ACT inhaler INHALE 2 PUFFS BY MOUTH EVERY 6 HOURS AS NEEDED FOR WHEEZING FOR SHORTNESS OF BREATH 9 g 0   botulinum toxin Type A  (BOTOX ) 200 units injection Inject 155 Units into the muscle every 3 (three) months. Inject 155 units IM into multiple site in the face,neck and head once every 90 days. 1 each 4   Cholecalciferol (VITAMIN D3) 250 MCG (10000 UT) capsule Take 10,000 Units by mouth daily.     famotidine  (PEPCID ) 40 MG tablet Take 40 mg by mouth 2 (two) times daily.     fenofibrate  (TRICOR ) 145 MG tablet Take 1 tablet (145 mg total) by mouth daily. 90 tablet 1   fluticasone  (FLONASE ) 50 MCG/ACT nasal spray Place 2 sprays into both nostrils as needed for allergies.     hydrOXYzine  (ATARAX ) 25 MG tablet Take 50 mg by mouth daily as needed for anxiety (sleep).     hyoscyamine  (LEVBID ) 0.375 MG 12 hr tablet Take 0.375 mg by mouth as needed for cramping.     ibuprofen (ADVIL) 800 MG tablet Take 800 mg by mouth as needed for moderate pain.  Needles & Syringes MISC Use syringes and needles for Sumatriptan  Injection 5 mL 5   polycarbophil (FIBERCON) 625 MG tablet Take 1,250 mg by mouth in the morning and at bedtime.     Polyvinyl Alcohol-Povidone (REFRESH OP) Place 2-3 drops into both eyes in the morning and at bedtime.     pregabalin  (LYRICA ) 75 MG capsule Take 75-150 mg by mouth See admin instructions. Take 75 mg by mouth in the morning and 150 mg by mouth at bedtime     promethazine (PHENERGAN) 12.5 MG tablet Take 12.5 mg by mouth every 6 (six) hours as needed for  nausea or vomiting.     Rimegepant Sulfate  (NURTEC) 75 MG TBDP DISSOLVE 1 TABLET BY MOUTH ONCE DAILY AS NEEDED, AS DIRECTED FOR MIGRAINE 8 tablet 5   SUMAtriptan  (IMITREX ) 6 MG/0.5ML SOLN injection Inject 0.5 mLs (6 mg total) into the skin as needed for migraine or headache. May repeat in 1 hour if headache persists or recurs.  Maximum 2 injections in 24 hours. 5 mL 11   tiZANidine  (ZANAFLEX ) 2 MG tablet TAKE 1 TABLET BY MOUTH 3 TIMES DAILY. 270 tablet 2   No current facility-administered medications on file prior to visit.    ALLERGIES: Allergies  Allergen Reactions   Metformin  And Related Other (See Comments)    GI upset   Metoclopramide  Other (See Comments)    headache   Gabapentin Other (See Comments)    hallucination   Keflex [Cephalexin] Rash   Levemir [Insulin  Detemir] Dermatitis    Whelps at injection site   Prednisone  Palpitations    oral    FAMILY HISTORY: Family History  Problem Relation Age of Onset   Cancer Mother    Diabetes Father    Liver disease Father    Migraines Paternal Aunt    Heart attack Maternal Grandmother    Cancer Paternal Grandfather    Migraines Paternal Aunt       Objective:  *** General: No acute distress.  Patient appears well-groomed.   Head:  Normocephalic/atraumatic Neck:  Supple.  No paraspinal tenderness.  Full range of motion. Heart:  Regular rate and rhythm. Neuro:  Alert and oriented.  Speech fluent and not dysarthric.  Language intact.  CN II-XII intact.  Bulk and tone normal.  Muscle strength 5/5 throughout.  Sensation to light touch intact.  Deep tendon reflexes 2+ throughout, toes downgoing.  Gait normal.  Romberg negative.    Juliene Dunnings, DO  CC: Roselie Bishop Mood, NP

## 2023-10-16 ENCOUNTER — Ambulatory Visit: Admitting: Neurology

## 2023-10-16 ENCOUNTER — Encounter: Payer: Self-pay | Admitting: Neurology

## 2023-10-19 ENCOUNTER — Telehealth: Payer: Self-pay | Admitting: Neurology

## 2023-10-19 NOTE — Telephone Encounter (Signed)
 We are writing to inform you that [facility], including all providers within this practice, will no longer be able to provide medical care to you.  This decision is the result of repeated missed appointments without adequate notice, which has disrupted our ability to provide timely and effective care to all patients.  10/16/23

## 2023-10-27 DIAGNOSIS — M47816 Spondylosis without myelopathy or radiculopathy, lumbar region: Secondary | ICD-10-CM | POA: Diagnosis not present

## 2023-10-27 DIAGNOSIS — M5416 Radiculopathy, lumbar region: Secondary | ICD-10-CM | POA: Diagnosis not present

## 2023-10-27 DIAGNOSIS — Z1330 Encounter for screening examination for mental health and behavioral disorders, unspecified: Secondary | ICD-10-CM | POA: Diagnosis not present

## 2023-10-27 DIAGNOSIS — M5116 Intervertebral disc disorders with radiculopathy, lumbar region: Secondary | ICD-10-CM | POA: Diagnosis not present

## 2023-10-30 ENCOUNTER — Encounter: Payer: Self-pay | Admitting: Neurology

## 2023-10-30 DIAGNOSIS — G43709 Chronic migraine without aura, not intractable, without status migrainosus: Secondary | ICD-10-CM

## 2023-10-30 DIAGNOSIS — G43719 Chronic migraine without aura, intractable, without status migrainosus: Secondary | ICD-10-CM

## 2023-10-30 DIAGNOSIS — H47013 Ischemic optic neuropathy, bilateral: Secondary | ICD-10-CM

## 2023-11-04 ENCOUNTER — Telehealth: Admitting: Family Medicine

## 2023-11-04 DIAGNOSIS — R399 Unspecified symptoms and signs involving the genitourinary system: Secondary | ICD-10-CM

## 2023-11-04 MED ORDER — SULFAMETHOXAZOLE-TRIMETHOPRIM 800-160 MG PO TABS
1.0000 | ORAL_TABLET | Freq: Two times a day (BID) | ORAL | 0 refills | Status: AC
Start: 2023-11-04 — End: 2023-11-09

## 2023-11-04 NOTE — Progress Notes (Signed)
E-Visit for Urinary Problems  We are sorry that you are not feeling well.  Here is how we plan to help!  Based on what you shared with me it looks like you most likely have a simple urinary tract infection.  A UTI (Urinary Tract Infection) is a bacterial infection of the bladder.  Most cases of urinary tract infections are simple to treat but a key part of your care is to encourage you to drink plenty of fluids and watch your symptoms carefully.  I have prescribed Bactrim DS One tablet twice a day for 5 days.  Your symptoms should gradually improve. Call us if the burning in your urine worsens, you develop worsening fever, back pain or pelvic pain or if your symptoms do not resolve after completing the antibiotic.  Urinary tract infections can be prevented by drinking plenty of water to keep your body hydrated.  Also be sure when you wipe, wipe from front to back and don't hold it in!  If possible, empty your bladder every 4 hours.  HOME CARE Drink plenty of fluids Compete the full course of the antibiotics even if the symptoms resolve Remember, when you need to go.go. Holding in your urine can increase the likelihood of getting a UTI! GET HELP RIGHT AWAY IF: You cannot urinate You get a high fever Worsening back pain occurs You see blood in your urine You feel sick to your stomach or throw up You feel like you are going to pass out  MAKE SURE YOU  Understand these instructions. Will watch your condition. Will get help right away if you are not doing well or get worse.   Thank you for choosing an e-visit.  Your e-visit answers were reviewed by a board certified advanced clinical practitioner to complete your personal care plan. Depending upon the condition, your plan could have included both over the counter or prescription medications.  Please review your pharmacy choice. Make sure the pharmacy is open so you can pick up prescription now. If there is a problem, you may contact  your provider through [employer] and have the prescription routed to another pharmacy.  Your safety is important to [place]. If you have drug allergies check your prescription carefully.   For the next 24 hours you can use MyChart to ask questions about today's visit, request a non-urgent call back, or ask for a work or school excuse. You will get an email in the next two days asking about your experience. I hope that your e-visit has been valuable and will speed your recovery. I have spent 5 minutes in review of e-visit questionnaire, review and updating patient chart, medical decision making and response to patient.   Reed Pandy, PA-C

## 2023-11-07 DIAGNOSIS — Z794 Long term (current) use of insulin: Secondary | ICD-10-CM | POA: Diagnosis not present

## 2023-11-07 DIAGNOSIS — H0288A Meibomian gland dysfunction right eye, upper and lower eyelids: Secondary | ICD-10-CM | POA: Diagnosis not present

## 2023-11-07 DIAGNOSIS — H47013 Ischemic optic neuropathy, bilateral: Secondary | ICD-10-CM | POA: Diagnosis not present

## 2023-11-07 DIAGNOSIS — E119 Type 2 diabetes mellitus without complications: Secondary | ICD-10-CM | POA: Diagnosis not present

## 2023-11-07 DIAGNOSIS — H25813 Combined forms of age-related cataract, bilateral: Secondary | ICD-10-CM | POA: Diagnosis not present

## 2023-11-07 DIAGNOSIS — H0288B Meibomian gland dysfunction left eye, upper and lower eyelids: Secondary | ICD-10-CM | POA: Diagnosis not present

## 2023-11-10 ENCOUNTER — Ambulatory Visit: Admitting: Neurology

## 2023-11-17 NOTE — Procedures (Signed)
Mask fit

## 2023-11-20 DIAGNOSIS — M48061 Spinal stenosis, lumbar region without neurogenic claudication: Secondary | ICD-10-CM | POA: Diagnosis not present

## 2023-11-20 DIAGNOSIS — M4726 Other spondylosis with radiculopathy, lumbar region: Secondary | ICD-10-CM | POA: Diagnosis not present

## 2023-11-20 DIAGNOSIS — M5116 Intervertebral disc disorders with radiculopathy, lumbar region: Secondary | ICD-10-CM | POA: Diagnosis not present

## 2023-11-20 DIAGNOSIS — M4316 Spondylolisthesis, lumbar region: Secondary | ICD-10-CM | POA: Diagnosis not present

## 2023-12-01 DIAGNOSIS — M48062 Spinal stenosis, lumbar region with neurogenic claudication: Secondary | ICD-10-CM | POA: Diagnosis not present

## 2023-12-01 DIAGNOSIS — M5416 Radiculopathy, lumbar region: Secondary | ICD-10-CM | POA: Diagnosis not present

## 2023-12-20 DIAGNOSIS — Z539 Procedure and treatment not carried out, unspecified reason: Secondary | ICD-10-CM | POA: Diagnosis not present

## 2023-12-20 DIAGNOSIS — K589 Irritable bowel syndrome without diarrhea: Secondary | ICD-10-CM | POA: Diagnosis not present

## 2023-12-20 DIAGNOSIS — I129 Hypertensive chronic kidney disease with stage 1 through stage 4 chronic kidney disease, or unspecified chronic kidney disease: Secondary | ICD-10-CM | POA: Diagnosis not present

## 2023-12-20 DIAGNOSIS — K3184 Gastroparesis: Secondary | ICD-10-CM | POA: Diagnosis not present

## 2023-12-20 DIAGNOSIS — E1122 Type 2 diabetes mellitus with diabetic chronic kidney disease: Secondary | ICD-10-CM | POA: Diagnosis not present

## 2023-12-20 DIAGNOSIS — N189 Chronic kidney disease, unspecified: Secondary | ICD-10-CM | POA: Diagnosis not present

## 2023-12-25 DIAGNOSIS — E1165 Type 2 diabetes mellitus with hyperglycemia: Secondary | ICD-10-CM | POA: Diagnosis not present

## 2023-12-26 DIAGNOSIS — M5416 Radiculopathy, lumbar region: Secondary | ICD-10-CM | POA: Diagnosis not present

## 2024-01-01 ENCOUNTER — Telehealth: Payer: Self-pay | Admitting: Neurology

## 2024-01-01 NOTE — Telephone Encounter (Signed)
 Pt. Would like [facility] Referral re faxed, [facility] saying they did not rcv it in July Fax:[phone]

## 2024-01-02 NOTE — Telephone Encounter (Signed)
Referral reprinted and re-faxed.

## 2024-01-08 DIAGNOSIS — M48062 Spinal stenosis, lumbar region with neurogenic claudication: Secondary | ICD-10-CM | POA: Diagnosis not present

## 2024-01-09 DIAGNOSIS — H2511 Age-related nuclear cataract, right eye: Secondary | ICD-10-CM | POA: Diagnosis not present

## 2024-01-23 DIAGNOSIS — G43709 Chronic migraine without aura, not intractable, without status migrainosus: Secondary | ICD-10-CM | POA: Diagnosis not present

## 2024-01-23 DIAGNOSIS — Z87440 Personal history of urinary (tract) infections: Secondary | ICD-10-CM | POA: Diagnosis not present

## 2024-01-23 DIAGNOSIS — N3010 Interstitial cystitis (chronic) without hematuria: Secondary | ICD-10-CM | POA: Diagnosis not present

## 2024-01-23 DIAGNOSIS — R399 Unspecified symptoms and signs involving the genitourinary system: Secondary | ICD-10-CM | POA: Diagnosis not present

## 2024-01-23 DIAGNOSIS — F17218 Nicotine dependence, cigarettes, with other nicotine-induced disorders: Secondary | ICD-10-CM | POA: Diagnosis not present

## 2024-01-23 DIAGNOSIS — F332 Major depressive disorder, recurrent severe without psychotic features: Secondary | ICD-10-CM | POA: Diagnosis not present

## 2024-01-30 ENCOUNTER — Ambulatory Visit: Payer: 59 | Admitting: Neurology

## 2024-02-26 DIAGNOSIS — K3184 Gastroparesis: Secondary | ICD-10-CM | POA: Diagnosis not present

## 2024-02-26 DIAGNOSIS — Z720 Tobacco use: Secondary | ICD-10-CM | POA: Diagnosis not present

## 2024-03-24 DIAGNOSIS — K3184 Gastroparesis: Secondary | ICD-10-CM | POA: Diagnosis not present

## 2024-03-28 DIAGNOSIS — H2512 Age-related nuclear cataract, left eye: Secondary | ICD-10-CM | POA: Diagnosis not present

## 2024-04-02 DIAGNOSIS — E559 Vitamin D deficiency, unspecified: Secondary | ICD-10-CM | POA: Diagnosis not present

## 2024-04-02 DIAGNOSIS — K219 Gastro-esophageal reflux disease without esophagitis: Secondary | ICD-10-CM | POA: Diagnosis not present

## 2024-04-02 DIAGNOSIS — N951 Menopausal and female climacteric states: Secondary | ICD-10-CM | POA: Diagnosis not present

## 2024-04-02 DIAGNOSIS — K3184 Gastroparesis: Secondary | ICD-10-CM | POA: Diagnosis not present

## 2024-04-02 DIAGNOSIS — E782 Mixed hyperlipidemia: Secondary | ICD-10-CM | POA: Diagnosis not present

## 2024-04-09 DIAGNOSIS — M5416 Radiculopathy, lumbar region: Secondary | ICD-10-CM | POA: Diagnosis not present

## 2024-04-09 DIAGNOSIS — M5116 Intervertebral disc disorders with radiculopathy, lumbar region: Secondary | ICD-10-CM | POA: Diagnosis not present

## 2024-04-09 DIAGNOSIS — M48062 Spinal stenosis, lumbar region with neurogenic claudication: Secondary | ICD-10-CM | POA: Diagnosis not present

## 2024-06-04 ENCOUNTER — Institutional Professional Consult (permissible substitution) (HOSPITAL_BASED_OUTPATIENT_CLINIC_OR_DEPARTMENT_OTHER): Admitting: Internal Medicine
